# Patient Record
Sex: Female | Born: 1945 | Race: White | Hispanic: No | Marital: Married | Smoking: Never smoker
Health system: Southern US, Community
[De-identification: ages and names within clinical notes are randomized; demographics above are authoritative.]

## PROBLEM LIST (undated history)

## (undated) DIAGNOSIS — R7303 Prediabetes: Secondary | ICD-10-CM

## (undated) DIAGNOSIS — N950 Postmenopausal bleeding: Secondary | ICD-10-CM

## (undated) DIAGNOSIS — H269 Unspecified cataract: Secondary | ICD-10-CM

## (undated) DIAGNOSIS — I639 Cerebral infarction, unspecified: Secondary | ICD-10-CM

## (undated) DIAGNOSIS — N6009 Solitary cyst of unspecified breast: Secondary | ICD-10-CM

## (undated) DIAGNOSIS — Q182 Other branchial cleft malformations: Secondary | ICD-10-CM

## (undated) DIAGNOSIS — G629 Polyneuropathy, unspecified: Secondary | ICD-10-CM

## (undated) DIAGNOSIS — W19XXXA Unspecified fall, initial encounter: Secondary | ICD-10-CM

## (undated) DIAGNOSIS — O339 Maternal care for disproportion, unspecified: Secondary | ICD-10-CM

## (undated) DIAGNOSIS — C801 Malignant (primary) neoplasm, unspecified: Secondary | ICD-10-CM

## (undated) DIAGNOSIS — I10 Essential (primary) hypertension: Secondary | ICD-10-CM

## (undated) DIAGNOSIS — J189 Pneumonia, unspecified organism: Secondary | ICD-10-CM

## (undated) DIAGNOSIS — E119 Type 2 diabetes mellitus without complications: Secondary | ICD-10-CM

## (undated) DIAGNOSIS — N840 Polyp of corpus uteri: Secondary | ICD-10-CM

## (undated) DIAGNOSIS — O039 Complete or unspecified spontaneous abortion without complication: Secondary | ICD-10-CM

## (undated) DIAGNOSIS — J40 Bronchitis, not specified as acute or chronic: Secondary | ICD-10-CM

## (undated) DIAGNOSIS — T7840XA Allergy, unspecified, initial encounter: Secondary | ICD-10-CM

## (undated) DIAGNOSIS — G459 Transient cerebral ischemic attack, unspecified: Secondary | ICD-10-CM

## (undated) DIAGNOSIS — R7302 Impaired glucose tolerance (oral): Secondary | ICD-10-CM

## (undated) DIAGNOSIS — R0600 Dyspnea, unspecified: Secondary | ICD-10-CM

## (undated) DIAGNOSIS — F419 Anxiety disorder, unspecified: Secondary | ICD-10-CM

## (undated) DIAGNOSIS — G4730 Sleep apnea, unspecified: Secondary | ICD-10-CM

## (undated) DIAGNOSIS — Z87442 Personal history of urinary calculi: Secondary | ICD-10-CM

## (undated) DIAGNOSIS — I499 Cardiac arrhythmia, unspecified: Secondary | ICD-10-CM

## (undated) DIAGNOSIS — R011 Cardiac murmur, unspecified: Secondary | ICD-10-CM

## (undated) DIAGNOSIS — M1990 Unspecified osteoarthritis, unspecified site: Secondary | ICD-10-CM

## (undated) DIAGNOSIS — M8580 Other specified disorders of bone density and structure, unspecified site: Secondary | ICD-10-CM

## (undated) DIAGNOSIS — N200 Calculus of kidney: Secondary | ICD-10-CM

## (undated) DIAGNOSIS — I480 Paroxysmal atrial fibrillation: Secondary | ICD-10-CM

## (undated) DIAGNOSIS — E669 Obesity, unspecified: Secondary | ICD-10-CM

## (undated) HISTORY — DX: Cerebral infarction, unspecified: I63.9

## (undated) HISTORY — PX: HYSTEROSCOPY: SHX211

## (undated) HISTORY — DX: Complete or unspecified spontaneous abortion without complication: O03.9

## (undated) HISTORY — DX: Postmenopausal bleeding: N95.0

## (undated) HISTORY — PX: TUBAL LIGATION: SHX77

## (undated) HISTORY — DX: Unspecified cataract: H26.9

## (undated) HISTORY — DX: Allergy, unspecified, initial encounter: T78.40XA

## (undated) HISTORY — DX: Other specified disorders of bone density and structure, unspecified site: M85.80

## (undated) HISTORY — DX: Unspecified fall, initial encounter: W19.XXXA

## (undated) HISTORY — DX: Cardiac murmur, unspecified: R01.1

## (undated) HISTORY — DX: Maternal care for disproportion, unspecified: O33.9

## (undated) HISTORY — DX: Other branchial cleft malformations: Q18.2

## (undated) HISTORY — PX: JOINT REPLACEMENT: SHX530

## (undated) HISTORY — PX: DILATION AND CURETTAGE OF UTERUS: SHX78

## (undated) HISTORY — DX: Calculus of kidney: N20.0

## (undated) HISTORY — DX: Polyp of corpus uteri: N84.0

## (undated) HISTORY — DX: Solitary cyst of unspecified breast: N60.09

---

## 1970-03-23 DIAGNOSIS — Q182 Other branchial cleft malformations: Secondary | ICD-10-CM

## 1970-03-23 HISTORY — DX: Other branchial cleft malformations: Q18.2

## 1979-03-24 DIAGNOSIS — O039 Complete or unspecified spontaneous abortion without complication: Secondary | ICD-10-CM

## 1979-03-24 HISTORY — DX: Complete or unspecified spontaneous abortion without complication: O03.9

## 1981-07-21 DIAGNOSIS — O339 Maternal care for disproportion, unspecified: Secondary | ICD-10-CM

## 1981-07-21 HISTORY — DX: Maternal care for disproportion, unspecified: O33.9

## 1990-03-23 DIAGNOSIS — N6009 Solitary cyst of unspecified breast: Secondary | ICD-10-CM

## 1990-03-23 HISTORY — DX: Solitary cyst of unspecified breast: N60.09

## 1991-04-24 HISTORY — PX: CERVICAL DISC SURGERY: SHX588

## 1997-05-24 ENCOUNTER — Ambulatory Visit (HOSPITAL_COMMUNITY): Admission: RE | Admit: 1997-05-24 | Discharge: 1997-05-24 | Payer: Self-pay | Admitting: *Deleted

## 1997-06-18 ENCOUNTER — Other Ambulatory Visit: Admission: RE | Admit: 1997-06-18 | Discharge: 1997-06-18 | Payer: Self-pay | Admitting: *Deleted

## 1998-06-24 ENCOUNTER — Other Ambulatory Visit: Admission: RE | Admit: 1998-06-24 | Discharge: 1998-06-24 | Payer: Self-pay | Admitting: *Deleted

## 1998-07-09 ENCOUNTER — Encounter: Payer: Self-pay | Admitting: *Deleted

## 1998-07-09 ENCOUNTER — Ambulatory Visit (HOSPITAL_COMMUNITY): Admission: RE | Admit: 1998-07-09 | Discharge: 1998-07-09 | Payer: Self-pay | Admitting: *Deleted

## 1999-06-23 ENCOUNTER — Other Ambulatory Visit: Admission: RE | Admit: 1999-06-23 | Discharge: 1999-06-23 | Payer: Self-pay | Admitting: *Deleted

## 1999-06-25 ENCOUNTER — Encounter: Admission: RE | Admit: 1999-06-25 | Discharge: 1999-06-25 | Payer: Self-pay | Admitting: *Deleted

## 1999-06-25 ENCOUNTER — Encounter: Payer: Self-pay | Admitting: *Deleted

## 1999-07-14 ENCOUNTER — Ambulatory Visit (HOSPITAL_COMMUNITY): Admission: RE | Admit: 1999-07-14 | Discharge: 1999-07-14 | Payer: Self-pay | Admitting: *Deleted

## 1999-07-14 ENCOUNTER — Encounter: Payer: Self-pay | Admitting: *Deleted

## 2000-04-23 DIAGNOSIS — N950 Postmenopausal bleeding: Secondary | ICD-10-CM

## 2000-04-23 DIAGNOSIS — N840 Polyp of corpus uteri: Secondary | ICD-10-CM

## 2000-04-23 HISTORY — DX: Postmenopausal bleeding: N95.0

## 2000-04-23 HISTORY — DX: Polyp of corpus uteri: N84.0

## 2000-07-09 ENCOUNTER — Other Ambulatory Visit: Admission: RE | Admit: 2000-07-09 | Discharge: 2000-07-09 | Payer: Self-pay | Admitting: *Deleted

## 2000-07-26 ENCOUNTER — Encounter (INDEPENDENT_AMBULATORY_CARE_PROVIDER_SITE_OTHER): Payer: Self-pay

## 2000-07-26 ENCOUNTER — Observation Stay (HOSPITAL_COMMUNITY): Admission: EM | Admit: 2000-07-26 | Discharge: 2000-07-27 | Payer: Self-pay | Admitting: Obstetrics and Gynecology

## 2001-01-06 ENCOUNTER — Encounter: Payer: Self-pay | Admitting: *Deleted

## 2001-01-06 ENCOUNTER — Ambulatory Visit (HOSPITAL_COMMUNITY): Admission: RE | Admit: 2001-01-06 | Discharge: 2001-01-06 | Payer: Self-pay | Admitting: *Deleted

## 2001-08-01 ENCOUNTER — Other Ambulatory Visit: Admission: RE | Admit: 2001-08-01 | Discharge: 2001-08-01 | Payer: Self-pay | Admitting: Obstetrics and Gynecology

## 2001-11-09 ENCOUNTER — Inpatient Hospital Stay (HOSPITAL_COMMUNITY): Admission: EM | Admit: 2001-11-09 | Discharge: 2001-11-12 | Payer: Self-pay | Admitting: Emergency Medicine

## 2001-11-19 ENCOUNTER — Emergency Department (HOSPITAL_COMMUNITY): Admission: EM | Admit: 2001-11-19 | Discharge: 2001-11-19 | Payer: Self-pay | Admitting: Emergency Medicine

## 2002-01-09 ENCOUNTER — Ambulatory Visit (HOSPITAL_COMMUNITY): Admission: RE | Admit: 2002-01-09 | Discharge: 2002-01-09 | Payer: Self-pay | Admitting: *Deleted

## 2002-01-09 ENCOUNTER — Encounter: Payer: Self-pay | Admitting: *Deleted

## 2002-08-08 ENCOUNTER — Other Ambulatory Visit: Admission: RE | Admit: 2002-08-08 | Discharge: 2002-08-08 | Payer: Self-pay | Admitting: *Deleted

## 2002-11-14 ENCOUNTER — Encounter: Payer: Self-pay | Admitting: *Deleted

## 2002-11-14 ENCOUNTER — Encounter: Admission: RE | Admit: 2002-11-14 | Discharge: 2002-11-14 | Payer: Self-pay | Admitting: *Deleted

## 2003-04-05 ENCOUNTER — Ambulatory Visit (HOSPITAL_COMMUNITY): Admission: RE | Admit: 2003-04-05 | Discharge: 2003-04-05 | Payer: Self-pay | Admitting: *Deleted

## 2003-08-09 ENCOUNTER — Other Ambulatory Visit: Admission: RE | Admit: 2003-08-09 | Discharge: 2003-08-09 | Payer: Self-pay | Admitting: *Deleted

## 2004-08-11 ENCOUNTER — Other Ambulatory Visit: Admission: RE | Admit: 2004-08-11 | Discharge: 2004-08-11 | Payer: Self-pay | Admitting: *Deleted

## 2004-10-24 ENCOUNTER — Ambulatory Visit (HOSPITAL_COMMUNITY): Admission: RE | Admit: 2004-10-24 | Discharge: 2004-10-24 | Payer: Self-pay | Admitting: *Deleted

## 2005-06-21 HISTORY — PX: WRIST SURGERY: SHX841

## 2006-01-01 ENCOUNTER — Ambulatory Visit: Payer: Self-pay | Admitting: Internal Medicine

## 2006-01-05 ENCOUNTER — Ambulatory Visit: Payer: Self-pay | Admitting: Internal Medicine

## 2006-02-19 ENCOUNTER — Other Ambulatory Visit: Admission: RE | Admit: 2006-02-19 | Discharge: 2006-02-19 | Payer: Self-pay | Admitting: Obstetrics & Gynecology

## 2006-02-19 ENCOUNTER — Ambulatory Visit (HOSPITAL_COMMUNITY): Admission: RE | Admit: 2006-02-19 | Discharge: 2006-02-19 | Payer: Self-pay | Admitting: Obstetrics & Gynecology

## 2006-08-22 HISTORY — PX: TOTAL KNEE ARTHROPLASTY: SHX125

## 2006-09-06 ENCOUNTER — Inpatient Hospital Stay (HOSPITAL_COMMUNITY): Admission: RE | Admit: 2006-09-06 | Discharge: 2006-09-09 | Payer: Self-pay | Admitting: Orthopedic Surgery

## 2007-02-18 ENCOUNTER — Ambulatory Visit: Payer: Self-pay | Admitting: Internal Medicine

## 2007-02-22 DIAGNOSIS — T7840XA Allergy, unspecified, initial encounter: Secondary | ICD-10-CM | POA: Insufficient documentation

## 2007-02-22 DIAGNOSIS — G4733 Obstructive sleep apnea (adult) (pediatric): Secondary | ICD-10-CM | POA: Insufficient documentation

## 2007-02-22 DIAGNOSIS — I10 Essential (primary) hypertension: Secondary | ICD-10-CM | POA: Insufficient documentation

## 2007-03-30 ENCOUNTER — Encounter: Payer: Self-pay | Admitting: Internal Medicine

## 2007-05-22 DIAGNOSIS — M8580 Other specified disorders of bone density and structure, unspecified site: Secondary | ICD-10-CM

## 2007-05-22 HISTORY — DX: Other specified disorders of bone density and structure, unspecified site: M85.80

## 2007-06-06 ENCOUNTER — Other Ambulatory Visit: Admission: RE | Admit: 2007-06-06 | Discharge: 2007-06-06 | Payer: Self-pay | Admitting: Obstetrics & Gynecology

## 2007-06-21 ENCOUNTER — Ambulatory Visit (HOSPITAL_COMMUNITY): Admission: RE | Admit: 2007-06-21 | Discharge: 2007-06-21 | Payer: Self-pay | Admitting: Obstetrics & Gynecology

## 2008-03-02 ENCOUNTER — Ambulatory Visit: Payer: Self-pay | Admitting: Internal Medicine

## 2008-05-03 ENCOUNTER — Emergency Department (HOSPITAL_COMMUNITY): Admission: EM | Admit: 2008-05-03 | Discharge: 2008-05-03 | Payer: Self-pay | Admitting: Emergency Medicine

## 2008-06-21 HISTORY — PX: TOTAL KNEE ARTHROPLASTY: SHX125

## 2008-07-09 ENCOUNTER — Inpatient Hospital Stay (HOSPITAL_COMMUNITY): Admission: RE | Admit: 2008-07-09 | Discharge: 2008-07-13 | Payer: Self-pay | Admitting: Orthopedic Surgery

## 2009-03-01 ENCOUNTER — Ambulatory Visit: Payer: Self-pay | Admitting: Internal Medicine

## 2009-03-28 ENCOUNTER — Emergency Department (HOSPITAL_COMMUNITY): Admission: EM | Admit: 2009-03-28 | Discharge: 2009-03-29 | Payer: Self-pay | Admitting: Emergency Medicine

## 2009-08-28 ENCOUNTER — Telehealth (INDEPENDENT_AMBULATORY_CARE_PROVIDER_SITE_OTHER): Payer: Self-pay | Admitting: *Deleted

## 2009-08-30 ENCOUNTER — Ambulatory Visit: Payer: Self-pay | Admitting: Internal Medicine

## 2009-08-30 DIAGNOSIS — J209 Acute bronchitis, unspecified: Secondary | ICD-10-CM | POA: Insufficient documentation

## 2009-10-04 ENCOUNTER — Ambulatory Visit (HOSPITAL_COMMUNITY): Admission: RE | Admit: 2009-10-04 | Discharge: 2009-10-04 | Payer: Self-pay | Admitting: Obstetrics & Gynecology

## 2010-02-07 ENCOUNTER — Emergency Department (HOSPITAL_COMMUNITY): Admission: EM | Admit: 2010-02-07 | Discharge: 2010-02-07 | Payer: Self-pay | Admitting: Emergency Medicine

## 2010-02-28 ENCOUNTER — Ambulatory Visit: Payer: Self-pay | Admitting: Internal Medicine

## 2010-04-24 NOTE — Miscellaneous (Signed)
Summary: CPAP report/[facility]  CPAP report/[facility]   Imported By: Maryln Gottron 04/05/2007 15:47:24  _____________________________________________________________________  External Attachment:    Type:   Image     Comment:   External Document

## 2010-04-24 NOTE — Assessment & Plan Note (Signed)
Summary: bronchitis per pt/accute visit/kcw   Primary Provider/Referring Provider:  Burnis Medin  CC:  Accute visit-SOB and cough-prod-clear to white-wheezing.Marland Kitchen  History of Present Illness:  03/02/08-OSA After last here she was autotitrated to cpap 11 for AHI 1-2/hr. She feels fine now w/ cpap 11- denies snore, tiredness. Doing fine and denies concerns or questions.  March 01, 2009- OSA Good compliance and control with CPAP 11, now using a nasal mask. Life is better withi it. she sleeps quietly. Had her other knee replaced in April. She coughed after surgery. Some occsional dry cough. We discussed Lisinopril. She is currently not bothered enough to want to change.  August 30, 2009- OSA Acute visit- Shared a viral bronchitis with husband and daughter. Husband got better. Daughter was dx'd asthma and given pred and abx. Sputum never purulent. Denies fever and sore throat but malaise. No GI upset. Some wheeze and deep tickle cough. took robitussin- nausea. Rock and rye suppresses cough.   Preventive Screening-Counseling & Management  Alcohol-Tobacco     Smoking Status: never  Current Medications (verified): 1)  Lisinopril-Hydrochlorothiazide 20-12.5 Mg Tabs (Lisinopril-Hydrochlorothiazide) .... Take 2 By Mouth Once Daily 2)  Calcium 1000mg  +d .... Take One Tab By Mouth Once Daily 3)  Centrum Silver   Tabs (Multiple Vitamins-Minerals) .... Take By Mouth Once Daily 4)  B Complex .... Use As Needed 5)  Cpap 11 Advanced 6)  Coq10 200 Mg Caps (Coenzyme Q10) .... Take 1 By Mouth Once Daily  Allergies (verified): 1)  ! Penicillin 2)  ! * Vistoril 3)  ! Tetracycline 4)  ! Erythromycin 5)  ! * Latex  Past History:  Past Medical History: Last updated: 02/22/2007 Hypertension Sleep Apnea  Past Surgical History: Last updated: 03/01/2009 cervical disk bilateral  knee replacement C-section D&C repair fx left wrist  Social History: Last updated: 03/02/2008 Patient  never smoked.  Married  Risk Factors: Smoking Status: never (08/30/2009)  Review of Systems      See HPI  The patient denies anorexia, fever, weight loss, weight gain, vision loss, decreased hearing, hoarseness, chest pain, syncope, dyspnea on exertion, peripheral edema, prolonged cough, headaches, hemoptysis, abdominal pain, melena, hematochezia, and severe indigestion/heartburn.    Vital Signs:  Patient profile:   65 year old female Weight:      245 pounds O2 Sat:      95 % on Room air Pulse rate:   75 / minute BP sitting:   136 / 84  (left arm) Cuff size:   large  Vitals Entered By: Reynaldo Minium CMA (August 30, 2009 12:34 PM)  O2 Flow:  Room air CC: Accute visit-SOB, cough-prod-clear to white-wheezing.   Physical Exam  Additional Exam:  General: A/Ox3; pleasant and cooperative, NAD, obese SKIN: no rash, lesions NODES: no lymphadenopathy HEENT: [place]/AT, EOM- WNL, Conjuctivae- clear, PERRLA, TM-WNL, Nose- clear, Throat- clear and wnl, Melampatti III NECK: Supple w/ fair ROM, JVD- none, normal carotid impulses w/o bruits Thyroid-  CHEST: Clear to P&A, no cough at all even with deep breath HEART: RRR, no m/g/r heard ABDOMEN: Soft and nl; nml bowel sounds; no organomegaly or masses noted ZOX:WRUE, nl pulses, no edema  NEURO: Grossly intact to observation      Impression & Recommendations:  Problem # 1:  ACUTE BRONCHITIS (ICD-466.0)  This was originally a shared visral pattern, possibly now sustained through pollen season and intothe orange ozone air qulaity days. She is broadly allergic to antibioitics with rsh, so will avoid that. will give neb  xop and depo.   Her updated medication list for this problem includes:    Promethazine-codeine 6.25-10 Mg/27ml Syrp (Promethazine-codeine) .Marland Kitchen... 1 teaspoon four times a day as needed cough  Problem # 2:  SLEEP APNEA (ICD-780.57) Continues good compliance and contro with her CPAP  Medications Added to Medication List This  Visit: 1)  Calcium 1000mg  +d  .... Take one tab by mouth once daily 2)  Coq10 200 Mg Caps (Coenzyme q10) .... Take 1 by mouth once daily 3)  Promethazine-codeine 6.25-10 Mg/69ml Syrp (Promethazine-codeine) .Marland Kitchen.. 1 teaspoon four times a day as needed cough  Other Orders: Est. Patient Level III (16109) Depo- Medrol 80mg  (J1040) Admin of Therapeutic Inj  intramuscular or subcutaneous (60454) Xopenex 1.25mg  (U9811) Nebulizer Tx (91478)  Patient Instructions: 1)  Please schedule a follow-up appointment in 1 year or as scheduled 2)  Neb xop 1.25 3)  depo 8p0 4)  Extra fluids to thin mucus. You can try plain mucinex otc if you need extra help clearing mucus. 5)  script for cough syrup if needed- may cause drowsiness. Prescriptions: PROMETHAZINE-CODEINE 6.25-10 MG/5ML SYRP (PROMETHAZINE-CODEINE) 1 teaspoon four times a day as needed cough  #200 ml x 0   Entered and Authorized by:   Waymon Budge MD   Signed by:   Waymon Budge MD on 08/30/2009   Method used:   Print then Give to Patient   RxID:   [id number]    Medication Administration  Injection # 1:    Medication: Depo- Medrol 80mg     Diagnosis: ACUTE BRONCHITIS (ICD-466.0)    Route: IM    Site: R deltoid    Exp Date: 06/2012    Lot #: [id number]    Mfr: Pharmacia    Patient tolerated injection without complications    Given by: Zackery Barefoot CMA (August 30, 2009 1:18 PM)  Medication # 1:    Medication: Xopenex 1.25mg     Diagnosis: ACUTE BRONCHITIS (ICD-466.0)    Dose: 1vial    Route: inhaled    Exp Date: 09-11    Lot #: [id number]    Mfr: Sepracor    Patient tolerated medication without complications    Given by: Zackery Barefoot CMA (August 30, 2009 1:20 PM)  Orders Added: 1)  Est. Patient Level III [13244] 2)  Depo- Medrol 80mg  [J1040] 3)  Admin of Therapeutic Inj  intramuscular or subcutaneous [96372] 4)  Xopenex 1.25mg  [J7614] 5)  Nebulizer Tx [01027]

## 2010-04-24 NOTE — Assessment & Plan Note (Signed)
Summary: rov 1 yr ///kp   Primary Provider/Referring Provider:  Burnis Medin  CC:  Yearly follow up visit-sleep spnea; needs new supplies..  History of Present Illness:  03/02/08-OSA After last here she was autotitrated to cpap 11 for AHI 1-2/hr. She feels fine now w/ cpap 11- denies snore, tiredness. Doing fine and denies concerns or questions.  March 01, 2009- OSA Good compliance and control with CPAP 11, now using a nasal mask. Life is better withi it. she sleeps quietly. Had her other knee replaced in April. She coughed after surgery. Some occsional dry cough. We discussed Lisinopril. She is currently not bothered enough to want to change.  August 30, 2009- OSA Acute visit- Shared a viral bronchitis with husband and daughter. Husband got better. Daughter was dx'd asthma and given pred and abx. Sputum never purulent. Denies fever and sore throat but malaise. No GI upset. Some wheeze and deep tickle cough. took robitussin- nausea. Rock and rye suppresses cough.  03-04-10-  OSA Nurse-CC: Yearly follow up visit-sleep spnea; needs new supplies. Using CPAP every night with good control at 11 cwp. She denies changes in her health status over the past year. She was changed from lisinopril due to cough and is doing better.     Preventive Screening-Counseling & Management  Alcohol-Tobacco     Smoking Status: never  Current Medications (verified): 1)  Losartan Potassium-Hctz 100-25 Mg Tabs (Losartan Potassium-Hctz) .... Take 1 By Mouth Once Daily 2)  Calcium 1000mg  +d .... Take One Tab By Mouth Once Daily 3)  Centrum Silver   Tabs (Multiple Vitamins-Minerals) .... Take By Mouth Once Daily 4)  B Complex .... Use As Needed 5)  Cpap 11 Advanced 6)  Coq10 200 Mg Caps (Coenzyme Q10) .... Take 1 By Mouth Once Daily  Allergies (verified): 1)  ! Penicillin 2)  ! * Vistoril 3)  ! Tetracycline 4)  ! Erythromycin 5)  ! * Latex  Past History:  Past Medical  History: Last updated: 02/22/2007 Hypertension Sleep Apnea  Past Surgical History: Last updated: 03/01/2009 cervical disk bilateral  knee replacement C-section D&C repair fx left wrist  Family History: Last updated: 03-04-2010 Father- died [place]  99 Mother- died [place]  54  Social History: Last updated: 03/02/2008 Patient never smoked.  Married  Risk Factors: Smoking Status: never (03-04-2010)  Family History: Father- died [place]  46 Mother- died [place]  91  Review of Systems      See HPI  The patient denies anorexia, fever, weight loss, weight gain, vision loss, decreased hearing, hoarseness, chest pain, syncope, dyspnea on exertion, peripheral edema, prolonged cough, headaches, hemoptysis, abdominal pain, severe indigestion/heartburn, muscle weakness, suspicious skin lesions, transient blindness, enlarged lymph nodes, and angioedema.    Vital Signs:  Patient profile:   65 year old female Weight:      255 pounds O2 Sat:      96 % on Room air Pulse rate:   100 / minute BP sitting:   134 / 80  (left arm) Cuff size:   large  Vitals Entered By: Reynaldo Minium CMA 03-04-10 2:35 PM)  O2 Flow:  Room air CC: Yearly follow up visit-sleep spnea; needs new supplies.   Physical Exam  Additional Exam:  General: A/Ox3; pleasant and cooperative, NAD, obese SKIN: no rash, lesions NODES: no lymphadenopathy HEENT: [employer]/AT, EOM- WNL, Conjuctivae- clear, PERRLA, TM-WNL, Nose- clear, Throat- clear and wnl, Mallampati  III NECK: Supple w/ fair ROM, JVD- none, normal  carotid impulses w/o bruits Thyroid-  CHEST: Clear to P&A, no cough at all even with deep breath HEART: RRR, no m/g/r heard ABDOMEN:Obese ZOX:WRUE, nl pulses, no edema  NEURO: Grossly intact to observation      Impression & Recommendations:  Problem # 1:  SLEEP APNEA (ICD-780.57)  Good compliace and control on CPAP. She needs some help communicating with the DME company about her supplies, but pressure and mask  seem good. Weight loss is encouraged.   Problem # 2:  HYPERTENSION (ICD-401.9)  We reviewed her experience changing from an ACE inhibitor as far a her cough , and contrasted allergy.  Her updated medication list for this problem includes:    Losartan Potassium-hctz 100-25 Mg Tabs (Losartan potassium-hctz) .Marland Kitchen... Take 1 by mouth once daily  Problem # 3:  ALLERGY (ICD-995.3) This is not a siginificant allergy season for her. We discussed colds, dry heat, indoor dust and ACEinhibitors.   Medications Added to Medication List This Visit: 1)  Losartan Potassium-hctz 100-25 Mg Tabs (Losartan potassium-hctz) .... Take 1 by mouth once daily 2)  Replacement Cpap Mask and Supplies   Other Orders: Est. Patient Level IV (45409)  Patient Instructions: 1)  Please schedule a follow-up appointment in 1 year. 2)  Script for replacement CPAP mask and supplies Prescriptions: REPLACEMENT CPAP MASK AND SUPPLIES   #1 x prn   Entered and Authorized by:   Waymon Budge MD   Signed by:   Waymon Budge MD on 02/28/2010   Method used:   Print then Give to Patient   RxID:   [id number]

## 2010-04-24 NOTE — Assessment & Plan Note (Signed)
Summary: 1 year//mbw   Primary Provider/Referring Provider:  Burnis Medin  CC:  follow up visit-sleep .  History of Present Illness: 02/18/07- PROBLEM:  A 65 year old woman with sleep apnea seeking to re-establish.   HISTORY:  [name] has a long history of sleep apnea with original symptoms of snoring and excessive daytime sleepiness.  On April 22 liters 1987, she had a diagnostic sleep study with an AHI of 51 obstructive events per hour.  At that time, she weighed 160 pounds. CPAP was titrated to 15, never with good control because of central events but she has continued at that pressure.  Her machine has been provided through [employer] and is now wearing out being at least 65 years old.  Life is definitely better using CPAP which she does regularly and she denies routine snoring or daytime sleepiness.  Bedtime is 9 p.m. to 10 p.m. with variable sleep latency, waking at least once during the night before finally up between 6:30 and 7 a.m.   03/02/08-OSA After last here she was autotitrated to cpap 11 for AHI 1-2/hr. She feels fine now w/ cpap 11- denies snore, tiredness. Doing fine and denies concerns or questions.  March 01, 2009- OSA Good compliance and control with CPAP 11, now using a nasal mask. Life is better withi it. she sleeps quietly. Had her other knee replaced in April. She coughed after surgery. Some occsional dry cough. We discussed Lisinopril. She is currently not bothered enough to want to change.   Current Medications (verified): 1)  Lisinopril-Hydrochlorothiazide 20-12.5 Mg Tabs (Lisinopril-Hydrochlorothiazide) .... Take 2 By Mouth Once Daily 2)  Calcium 600mg  +d .... Take One Tab By Mouth Once Daily 3)  Centrum Silver   Tabs (Multiple Vitamins-Minerals) .... Take By Mouth Once Daily 4)  B Complex .... Use As Needed 5)  Cpap 11 Advanced  Allergies (verified): 1)  ! Penicillin 2)  ! * Vistoril 3)  ! Tetracycline 4)  ! Erythromycin 5)  !  * Latex  Past History:  Past Medical History: Last updated: 02/22/2007 Hypertension Sleep Apnea  Social History: Last updated: 03/02/2008 Patient never smoked.  Married  Risk Factors: Smoking Status: never (03/02/2008)  Past Surgical History: cervical disk bilateral  knee replacement C-section D&C repair fx left wrist  Review of Systems      See HPI  The patient denies anorexia, fever, weight loss, weight gain, vision loss, decreased hearing, hoarseness, chest pain, syncope, dyspnea on exertion, peripheral edema, prolonged cough, headaches, hemoptysis, abdominal pain, and melena.    Vital Signs:  Patient profile:   65 year old female Weight:      240.38 pounds O2 Sat:      97 % on Room air Pulse rate:   73 / minute BP sitting:   126 / 78  (left arm) Cuff size:   large  Vitals Entered By: Reynaldo Minium CMA (March 01, 2009 2:21 PM)  O2 Flow:  Room air  Physical Exam  Additional Exam:  General: A/Ox3; pleasant and cooperative, NAD, obese SKIN: no rash, lesions NODES: no lymphadenopathy HEENT: /AT, EOM- WNL, Conjuctivae- clear, PERRLA, TM-WNL, Nose- clear, Throat- clear and wnl, Melampatti III NECK: Supple w/ fair ROM, JVD- none, normal carotid impulses w/o bruits Thyroid-  CHEST: Clear to P&A, no cough at all even with deep breath HEART: RRR, no m/g/r heard ABDOMEN: Soft and nl; nml bowel sounds; no organomegaly or masses noted ZOX:WRUE, nl pulses, no edema  NEURO: Grossly intact to observation  Impression & Recommendations:  Problem # 1:  SLEEP APNEA (ICD-780.57)  We can continue her cpap at 11, noting very good compliance and control. Weight loss would help.  Medications Added to Medication List This Visit: 1)  Lisinopril-hydrochlorothiazide 20-12.5 Mg Tabs (Lisinopril-hydrochlorothiazide) .... Take 2 by mouth once daily  Other Orders: Est. Patient Level II (16109)  Patient Instructions: 1)  Schedule return in one year, earlier if needed  2)  continue cpap at 11- call if any problems  Prevention & Chronic Care Immunizations   Influenza vaccine: Not documented    Tetanus booster: Not documented    Pneumococcal vaccine: Not documented    H. zoster vaccine: Not documented  Colorectal Screening   Hemoccult: Not documented    Colonoscopy: Not documented  Other Screening   Pap smear: Not documented    Mammogram: Not documented    DXA bone density scan: Not documented   Smoking status: never  (03/02/2008)  Lipids   Total Cholesterol: Not documented   LDL: Not documented   LDL Direct: Not documented   HDL: Not documented   Triglycerides: Not documented  Hypertension   Last Blood Pressure: 126 / 78  (03/01/2009)   Serum creatinine: Not documented   Serum potassium Not documented  Self-Management Support :    Hypertension self-management support: Not documented

## 2010-04-24 NOTE — Progress Notes (Signed)
Summary: appt needed/cb  Phone Note Call from Patient Call back at Home Phone [phone]   Caller: Patient Call For: young Summary of Call: pt has bronchitis and wants to be seen friday nothin avaliable till 6/14 Initial call taken by: Lacinda Axon,  August 28, 2009 2:42 PM  Follow-up for Phone Call        Pt can come in on Friday at 1115am; pt aware of the appt and will be here at 11am.Katie [facility] CMA  August 28, 2009 2:46 PM

## 2010-04-24 NOTE — Assessment & Plan Note (Signed)
Summary: rov/apc   PCP:  Burnis Medin  Chief Complaint:  follow up visit .  History of Present Illness: Current Problems:  ALLERGY (ICD-995.3) SLEEP APNEA (ICD-780.57) HYPERTENSION (ICD-401.9)  02/18/07- PROBLEM:  A 65 year old woman with sleep apnea seeking to re-establish.   HISTORY:  Nicole Cabrera has a long history of sleep apnea with original symptoms of snoring and excessive daytime sleepiness.  On April 22 liters 1987, she had a diagnostic sleep study with an AHI of 51 obstructive events per hour.  At that time, she weighed 160 pounds. CPAP was titrated to 15, never with good control because of central events but she has continued at that pressure.  Her machine has been provided through [employer] and is now wearing out being at least 65 years old.  Life is definitely better using CPAP which she does regularly and she denies routine snoring or daytime sleepiness.  Bedtime is 9 p.m. to 10 p.m. with variable sleep latency, waking at least once during the night before finally up between 6:30 and 7 a.m.   03/02/08-OSA After last here she was autotitrated to cpap 11 for AHI 1-2/hr. She feels fine now w/ cpap 11- denies snore, tiredness. Doing fine and denies concerns or questions.          Prior Medications Reviewed Using: Patient Recall  Updated Prior Medication List: LISINOPRIL-HYDROCHLOROTHIAZIDE 20-25 MG TABS (LISINOPRIL-HYDROCHLOROTHIAZIDE) take 1 by mouth once daily * CALCIUM 600MG  +D take one tab by mouth once daily KLOR-CON M10 10 MEQ  TBCR (POTASSIUM CHLORIDE CRYS CR) take by mouth once daily CENTRUM SILVER   TABS (MULTIPLE VITAMINS-MINERALS) take by mouth once daily * B COMPLEX use as needed  Current Allergies (reviewed today): ! PENICILLIN ! * VISTORIL ! TETRACYCLINE ! ERYTHROMYCIN ! * LATEX  Past Medical History:    Reviewed history from 02/22/2007 and no changes required:       Hypertension       Sleep Apnea  Past Surgical History:   Reviewed history and no changes required:       cervical disk       right knee replacement       C-section       D&C       epair fx left wrist   Social History:    Reviewed history and no changes required:       Patient never smoked.        Married   Risk Factors:  Tobacco use:  never   Review of Systems      See HPI   Vital Signs:  Patient Profile:   65 Years Old Female Weight:      251.50 pounds O2 Sat:      95 % O2 treatment:    Room Air Pulse rate:   69 / minute BP sitting:   142 / 92  (right arm) Cuff size:   large  Vitals Entered By: Nicole Cabrera CMA (March 02, 2008 2:08 PM)             Comments Medications reviewed with patient Nicole Cabrera CMA  March 02, 2008 2:08 PM      Physical Exam  General: A/Ox3; pleasant and cooperative, NAD, obese SKIN: no rash, lesions NODES: no lymphadenopathy HEENT: [place]/AT, EOM- WNL, Conjuctivae- clear, PERRLA, TM-WNL, Nose- clear, Throat- clear and wnl, Melampatti III NECK: Supple w/ fair ROM, JVD- none, normal carotid impulses w/o bruits Thyroid- normal to palpation CHEST: Clear to P&A HEART: RRR, no m/g/r heard  ABDOMEN: Soft and nl; nml bowel sounds; no organomegaly or masses noted WNU:UVOZ, nl pulses, no edema  NEURO: Grossly intact to observation         Impression & Recommendations:  Problem # 1:  SLEEP APNEA (ICD-780.57) Good control and compliance  Problem # 2:  ALLERGY (ICD-995.3) Discussed options. Prn antihistamine.  Medications Added to Medication List This Visit: 1)  Lisinopril-hydrochlorothiazide 20-25 Mg Tabs (Lisinopril-hydrochlorothiazide) .... Take 1 by mouth once daily 2)  Cpap 11 Advanced    Patient Instructions: 1)  Please schedule a follow-up appointment in 1 year. 2)  Continue cpap at 11 cwp. Call the home care company or call us if you have questions or problems.   ]

## 2010-06-08 LAB — BASIC METABOLIC PANEL
BUN: 16 mg/dL (ref 6–23)
CO2: 28 mEq/L (ref 19–32)
Calcium: 9.4 mg/dL (ref 8.4–10.5)
Chloride: 102 mEq/L (ref 96–112)
Creatinine, Ser: 1.02 mg/dL (ref 0.4–1.2)
GFR calc Af Amer: >60 mL/min (ref >60)
GFR calc non Af Amer: 55 mL/min — ABNORMAL LOW (ref >60)
Glucose, Bld: 128 mg/dL — ABNORMAL HIGH (ref 70–99)
Potassium: 3.6 mEq/L (ref 3.5–5.1)
Sodium: 138 mEq/L (ref 135–145)

## 2010-06-08 LAB — APTT: aPTT: 30 seconds (ref 24–37)

## 2010-06-08 LAB — CBC
HCT: 42.1 % (ref 36.0–46.0)
Hemoglobin: 14.2 g/dL (ref 12.0–15.0)
MCHC: 33.8 g/dL (ref 30.0–36.0)
MCV: 83.3 fL (ref 78.0–100.0)
Platelets: 264 10*3/uL (ref 150–400)
RBC: 5.06 MIL/uL (ref 3.87–5.11)
RDW: 14 % (ref 11.5–15.5)
WBC: 11.3 10*3/uL — ABNORMAL HIGH (ref 4.0–10.5)

## 2010-06-08 LAB — DIFFERENTIAL
Basophils Absolute: 0.1 10*3/uL (ref 0.0–0.1)
Basophils Relative: 1 % (ref 0–1)
Eosinophils Absolute: 0.2 10*3/uL (ref 0.0–0.7)
Eosinophils Relative: 2 % (ref 0–5)
Lymphocytes Relative: 20 % (ref 12–46)
Lymphs Abs: 2.2 10*3/uL (ref 0.7–4.0)
Monocytes Absolute: 0.7 10*3/uL (ref 0.1–1.0)
Monocytes Relative: 6 % (ref 3–12)
Neutro Abs: 8.1 10*3/uL — ABNORMAL HIGH (ref 1.7–7.7)
Neutrophils Relative %: 72 % (ref 43–77)

## 2010-06-08 LAB — PROTIME-INR
INR: 0.95 (ref 0.00–1.49)
Prothrombin Time: 12.6 seconds (ref 11.6–15.2)

## 2010-07-02 LAB — TYPE AND SCREEN
ABO/RH(D): O POS
Antibody Screen: NEGATIVE

## 2010-07-02 LAB — CBC
HCT: 31.2 % — ABNORMAL LOW (ref 36.0–46.0)
HCT: 31.8 % — ABNORMAL LOW (ref 36.0–46.0)
HCT: 31.9 % — ABNORMAL LOW (ref 36.0–46.0)
HCT: 40.7 % (ref 36.0–46.0)
Hemoglobin: 10.6 g/dL — ABNORMAL LOW (ref 12.0–15.0)
Hemoglobin: 10.8 g/dL — ABNORMAL LOW (ref 12.0–15.0)
Hemoglobin: 10.8 g/dL — ABNORMAL LOW (ref 12.0–15.0)
Hemoglobin: 13.7 g/dL (ref 12.0–15.0)
MCHC: 33.9 g/dL (ref 30.0–36.0)
MCHC: 34 g/dL (ref 30.0–36.0)
MCHC: 34 g/dL (ref 30.0–36.0)
MCHC: 33.6 g/dL (ref 30.0–36.0)
MCV: 84.2 fL (ref 78.0–100.0)
MCV: 84.6 fL (ref 78.0–100.0)
MCV: 84.6 fL (ref 78.0–100.0)
MCV: 84.6 fL (ref 78.0–100.0)
Platelets: 188 10*3/uL (ref 150–400)
Platelets: 197 10*3/uL (ref 150–400)
Platelets: 203 10*3/uL (ref 150–400)
Platelets: 273 10*3/uL (ref 150–400)
RBC: 3.71 MIL/uL — ABNORMAL LOW (ref 3.87–5.11)
RBC: 3.76 MIL/uL — ABNORMAL LOW (ref 3.87–5.11)
RBC: 3.77 MIL/uL — ABNORMAL LOW (ref 3.87–5.11)
RBC: 4.81 MIL/uL (ref 3.87–5.11)
RDW: 14.3 % (ref 11.5–15.5)
RDW: 14.5 % (ref 11.5–15.5)
RDW: 14.5 % (ref 11.5–15.5)
RDW: 14.1 % (ref 11.5–15.5)
WBC: 10 10*3/uL (ref 4.0–10.5)
WBC: 11.5 10*3/uL — ABNORMAL HIGH (ref 4.0–10.5)
WBC: 11.5 10*3/uL — ABNORMAL HIGH (ref 4.0–10.5)
WBC: 9.1 10*3/uL (ref 4.0–10.5)

## 2010-07-02 LAB — COMPREHENSIVE METABOLIC PANEL
ALT: 25 U/L (ref 0–35)
AST: 21 U/L (ref 0–37)
Albumin: 3.3 g/dL — ABNORMAL LOW (ref 3.5–5.2)
Alkaline Phosphatase: 75 U/L (ref 39–117)
BUN: 10 mg/dL (ref 6–23)
CO2: 30 mEq/L (ref 19–32)
Calcium: 9.1 mg/dL (ref 8.4–10.5)
Chloride: 107 mEq/L (ref 96–112)
Creatinine, Ser: 0.74 mg/dL (ref 0.4–1.2)
GFR calc Af Amer: >60 mL/min (ref >60)
GFR calc non Af Amer: >60 mL/min (ref >60)
Glucose, Bld: 112 mg/dL — ABNORMAL HIGH (ref 70–99)
Potassium: 4.6 mEq/L (ref 3.5–5.1)
Sodium: 142 mEq/L (ref 135–145)
Total Bilirubin: 0.5 mg/dL (ref 0.3–1.2)
Total Protein: 6.5 g/dL (ref 6.0–8.3)

## 2010-07-02 LAB — PROTIME-INR
INR: 1.3 (ref 0.00–1.49)
INR: 1.5 (ref 0.00–1.49)
INR: 1.6 — ABNORMAL HIGH (ref 0.00–1.49)
INR: 1.9 — ABNORMAL HIGH (ref 0.00–1.49)
INR: 1 (ref 0.00–1.49)
Prothrombin Time: 16.4 seconds — ABNORMAL HIGH (ref 11.6–15.2)
Prothrombin Time: 18.8 seconds — ABNORMAL HIGH (ref 11.6–15.2)
Prothrombin Time: 19.3 seconds — ABNORMAL HIGH (ref 11.6–15.2)
Prothrombin Time: 22.6 seconds — ABNORMAL HIGH (ref 11.6–15.2)
Prothrombin Time: 13.1 seconds (ref 11.6–15.2)

## 2010-07-02 LAB — URINALYSIS, ROUTINE W REFLEX MICROSCOPIC
Bilirubin Urine: NEGATIVE
Glucose, UA: NEGATIVE mg/dL
Hgb urine dipstick: NEGATIVE
Ketones, ur: NEGATIVE mg/dL
Nitrite: NEGATIVE
Protein, ur: NEGATIVE mg/dL
Specific Gravity, Urine: 1.011 (ref 1.005–1.030)
Urobilinogen, UA: 0.2 mg/dL (ref 0.0–1.0)
pH: 7 (ref 5.0–8.0)

## 2010-07-02 LAB — BASIC METABOLIC PANEL
BUN: 10 mg/dL (ref 6–23)
BUN: 6 mg/dL (ref 6–23)
CO2: 27 mEq/L (ref 19–32)
CO2: 30 mEq/L (ref 19–32)
Calcium: 7.6 mg/dL — ABNORMAL LOW (ref 8.4–10.5)
Calcium: 7.8 mg/dL — ABNORMAL LOW (ref 8.4–10.5)
Chloride: 100 mEq/L (ref 96–112)
Chloride: 103 mEq/L (ref 96–112)
Creatinine, Ser: 0.58 mg/dL (ref 0.4–1.2)
Creatinine, Ser: 0.63 mg/dL (ref 0.4–1.2)
GFR calc Af Amer: >60 mL/min (ref >60)
GFR calc Af Amer: >60 mL/min (ref >60)
GFR calc non Af Amer: >60 mL/min (ref >60)
GFR calc non Af Amer: >60 mL/min (ref >60)
Glucose, Bld: 131 mg/dL — ABNORMAL HIGH (ref 70–99)
Glucose, Bld: 132 mg/dL — ABNORMAL HIGH (ref 70–99)
Potassium: 3.5 mEq/L (ref 3.5–5.1)
Potassium: 3.6 mEq/L (ref 3.5–5.1)
Sodium: 135 mEq/L (ref 135–145)
Sodium: 136 mEq/L (ref 135–145)

## 2010-07-02 LAB — GLUCOSE, CAPILLARY
Glucose-Capillary: 112 mg/dL — ABNORMAL HIGH (ref 70–99)
Glucose-Capillary: 91 mg/dL (ref 70–99)

## 2010-07-02 LAB — APTT: aPTT: 32 seconds (ref 24–37)

## 2010-07-08 LAB — POCT I-STAT, CHEM 8
BUN: 11 mg/dL (ref 6–23)
Calcium, Ion: 1.13 mmol/L (ref 1.12–1.32)
Chloride: 103 mEq/L (ref 96–112)
Creatinine, Ser: 0.7 mg/dL (ref 0.4–1.2)
Glucose, Bld: 114 mg/dL — ABNORMAL HIGH (ref 70–99)
HCT: 46 % (ref 36.0–46.0)
Hemoglobin: 15.6 g/dL — ABNORMAL HIGH (ref 12.0–15.0)
Potassium: 3.3 mEq/L — ABNORMAL LOW (ref 3.5–5.1)
Sodium: 140 mEq/L (ref 135–145)
TCO2: 26 mmol/L (ref 0–100)

## 2010-07-08 LAB — POCT CARDIAC MARKERS
CKMB, poc: <1 ng/mL — ABNORMAL LOW (ref 1.0–8.0)
Myoglobin, poc: 54 ng/mL (ref 12–200)
Troponin i, poc: <0.05 ng/mL (ref 0.00–0.09)

## 2010-08-05 NOTE — Discharge Summary (Signed)
[name], Nicole Cabrera               ACCOUNT NO.:  [id number]   MEDICAL RECORD NO.:  [id number]          PATIENT TYPE:  INP   LOCATION:  1602                         FACILITY:  [facility]   PHYSICIAN:  Ollen Gross, M.D.    DATE OF BIRTH:  09/14/1945   DATE OF ADMISSION:  07/09/2008  DATE OF DISCHARGE:  07/13/2008                               DISCHARGE SUMMARY   ADMITTING DIAGNOSES:  1. Osteoarthritis, left knee.  2. Hypertension.  3. Allergic rhinitis.  4. Obstructive sleep apnea, using CPAP.  5. __________ .  6. Early cataracts.  7. History of seasonal bronchitis.  8. Hemorrhoids.  9. History of renal calculi.  10.Peripheral neuropathy.  11.Postmenopausal.  12.Borderline glucose intolerance.  13.Degenerative disk disease.   DISCHARGE DIAGNOSES:  1. Osteoarthritis, left knee, status post left total knee replacement      arthroplasty.  2. Postoperative volume overload with pleural effusions.  3. Postoperative hypoxia secondary to #2.  4. Hypertension.  5. Allergic rhinitis.  6. Obstructive sleep apnea, using CPAP.  7. __________ .  8. Early cataracts.  9. History of seasonal bronchitis.  10.Hemorrhoids.  11.History of renal calculi.  12.Peripheral neuropathy.  13.Postmenopausal.  14.Borderline glucose intolerance.  15.Degenerative disk disease.  16.Postoperative subsegmental atelectasis.   PROCEDURE:  July 09, 2008:  Left total knee, surgeon Dr. Lequita Halt,  assistant Avel Peace, PA-C.  Surgery under spinal anesthesia with  Duramorph added.  Tourniquet time 30 minutes.   CONSULTATIONS:  None.   BRIEF HISTORY:  Nicole Cabrera is a 65 year old female with a history of  arthritis of the left knee with progressively worsening pain and  dysfunction, failed operative management, and now presents for a total  knee arthroplasty.   LABORATORY DATA:  Preop CBC showed a hemoglobin of 13.7, hematocrit  40.7, white cell count 9.1, platelets 273.  PT/INR 13.1 and 1.0, PTT of  32.   Chem panel on admission all within normal limits with the exception  of low albumin of 3.3.  Preop UA was negative.  Postop hemoglobin down  to 10.8 and was stabilized, last H and H 10.6 and 31.2.  PT/INRs  followed daily.  Last noted PT/INR 22.6 and 1.9.  Serial BMETs were  followed.  Electrolytes remained within normal limits.   X-RAYS:  Two-view chest dated May 03, 2008:  No active lung  disease, borderline cardiomegaly.  Follow-up chest on July 12, 2008  showed mild progression of vascular congestion, small pleural effusions,  mild fluid overload.  CT angio tests:  Chest done July 12, 2008, no  specific features to suggest acute pulmonary embolus, some mild  congestive heart failure suspected, bibasilar atelectasis, multifocal  non calcified pulmonary nodules, intermediate, follow-up exam in 3  months was recommended.  Follow-up chest on July 13, 2008:  Cardiomegaly, vascular congestion, right subsegmental atelectasis,  decrease in pleural effusions, very minimal left pleural effusion.   HOSPITAL COURSE:  The patient was admitted to [facility] and  taken to the OR, underwent the above-stated procedure without  complication.  The patient tolerated the procedure well, later  transferred to the recovery room  and orthopedic floor.  Started on PCA  and p.o. analgesic for pain control following surgery.  Not using much  of the PCA through the night and morning of day 1, so we discontinued  that and encouraged her to use p.o. pills.  She was given 24 hours  postop IV antibiotics.  Did not get much rest on that first night.  Hemoglobin was stable.  Resumed her home meds.  Lisinopril was on hold  due to the ACE inhibitor portion.  Used her CPAP at night.  She did have  a little bit of cough and congestion during the procedure and also on  postop day #1.  Monitored and decided on chest x-ray if she had  increasing secretions and also temperature.  We added a little bit of   Claritin and/or Zyrtec.  She did have a history of seasonal bronchitis  in the past and wanted to make sure she was not getting aggravated by  some of the pollen and current seasonal effects of the environment.  By  day 2 she was doing a little bit better.  Hemoglobin was stable.  Blood  pressure was stable and dressing was changed.  Incision looked good.  She was starting to wean off of the O2, although after being off the O2,  her O2 sats dropped a little bit.  She was seen on rounds on day 3 and  there had been some drop in her O2 sats during the evening and into the  next morning.  Hemoglobin was stable at 10.6.  We checked a chest x-ray  which did show vascular congestion consistent more with mild fluid  overload and pleural effusions.  She was given Lasix.  She diuresed  extremely well with the Lasix, although we did check a CT angio just to  make sure there was no other component, especially PE.  The chest CT was  negative but did confirm the effusions and some subsegmental  atelectasis.  She was encouraged to use incentive spirometer.  By the  following day, her follow-up chest x-ray was improved.  O2 sats had  improved.  She was tolerating p.o. meds, in better spirits, and actually  progressing well with therapy, and she was discharged home on July 13, 2008.   DISCHARGE PLAN:  1. Patient discharged home on July 13, 2008.  2. Discharge diagnoses:  Please see above.   DISCHARGE MEDICATIONS:  Percocet, Robaxin, Coumadin.   FOLLOWUP:  2 weeks.   ACTIVITY:  She is weightbearing as tolerated left lower extremity.  Home  health PT,  home health nursing, total knee protocol.  Follow up in 2  weeks.   DIET:  Heart-healthy diet.   DISPOSITION:  Home.   CONDITION ON DISCHARGE:  Improved.      [name], P.A.C.      Ollen Gross, M.D.  Electronically Signed    ALP/MEDQ  D:  07/13/2008  T:  07/13/2008  Job:  [id number]   cc:   Wyvonnia Lora  Fax:  [phone]

## 2010-08-05 NOTE — H&P (Signed)
Nicole Cabrera, Nicole Cabrera               ACCOUNT NO.:  [id number]   MEDICAL RECORD NO.:  [id number]          PATIENT TYPE:  INP   LOCATION:  1602                         FACILITY:  [facility]   PHYSICIAN:  Ollen Gross, M.D.    DATE OF BIRTH:  04/16/1945   DATE OF ADMISSION:  07/09/2008  DATE OF DISCHARGE:                              HISTORY & PHYSICAL   CHIEF COMPLAINT:  Left knee pain.   HISTORY OF PRESENT ILLNESS:  The patient is a 65 year old female who has  been seen by Ollen Gross, M.D. for ongoing left knee pain.  She has  previously undergone a right total knee back in June of 2008.  Her left  knee continues to be symptomatic and problematic though, she is known to  have end-stage arthritis.  It was felt she would benefit from undergoing  surgical intervention.  The risks and benefits have been discussed.  She  elects to proceed with surgery.   ALLERGIES:  PENICILLIN CAUSES RASH.  TETRACYCLINE CAUSES RASH.  ERYTHROMYCIN CAUSES RASH.  INTOLERANCES WITH VISTARIL WILD AND CLIMBING  THE WALLS.   CURRENT MEDICATIONS:  Centrum Woman's vitamin, Co-Q 10 enzyme, B  complex, calcium plus D, lisinopril, hydrochlorothiazide.   PAST MEDICAL HISTORY:  1. Hypertension.  2. Obstructive sleep apnea, uses CPAP.  3. Obesity.  4. Early cataracts.  5. History of bronchitis.  6. History of hemorrhoids.  7. History of renal calculi.  8. Peripheral neuropathy.  9. Postmenopausal.  10.Borderline glucose intolerance.  11.Degenerative disk disease.   PAST SURGICAL HISTORY:  1. Left wrist surgery.  2. Uterine polyp removal.  3. Cervical disk surgery.  4. Cesarean section.  5. D and C times two.  6. Right total knee.   FAMILY HISTORY:  Father deceased at 55 with MI.  Mother, multiple  medical problems including colon cancer, heart disease.   SOCIAL HISTORY:  Nonsmoker.  Social intake of alcohol.  Three children.   REVIEW OF SYSTEMS:  GENERAL:  No fevers, chills or night sweats.  NEURO:  No  seizures, syncope or paralysis.  RESPIRATORY:  No shortness breath,  productive cough or hemoptysis.  CARDIOVASCULAR:  No chest pain or  orthopnea.  GI:  A little bit of diarrhea a couple weeks prior to the  surgery, but that already has improved by the time of the H and P.  No  nausea, vomiting, constipation.  No bloody mucus in the stool.  GU:  No  dysuria, hematuria or discharge.  MUSCULOSKELETAL:  Left knee.   PHYSICAL EXAMINATION:  VITAL SIGNS:  Pulse 76, respirations 14, blood  pressure 136/84.  GENERAL:  A 65 year old white female well-nourished, well-developed,  overweight, obese, no acute distress.  She is alert, oriented,  cooperative, pleasant and accompanied by her husband.  HEENT:  Normocephalic and atraumatic.  Pupils are round and reactive.  EOMs intact.  Noted to wear glasses.  NECK:  Supple.  CHEST:  Clear anterior and posterior chest walls.  No rhonchi, rales or  wheezing.  HEART:  Regular rate and rhythm.  No murmur.  S1 and S2 noted.  ABDOMEN:  Soft, large round protuberant abdomen, bowel sounds present.  BREASTS/RECTAL/GENITALIA:  Not done, not pertinent to present illness.  EXTREMITIES:  Left knee range as 5-120, marked crepitus, tender over  medial and lateral.  No instability.   IMPRESSION:  Osteoarthritis, left knee.   PLAN:  The patient was admitted to [facility] to undergo a  left total knee replacement arthroplasty.  Surgery will be performed by  Ollen Gross, M.D.      [name], P.A.C.      Ollen Gross, M.D.  Electronically Signed    ALP/MEDQ  D:  07/08/2008  T:  07/09/2008  Job:  [id number]   cc:   Wyvonnia Lora  Fax: [phone]

## 2010-08-05 NOTE — Op Note (Signed)
NAMEMARJEAN, IMPERATO               ACCOUNT NO.:  [id number]   MEDICAL RECORD NO.:  [id number]          PATIENT TYPE:  INP   LOCATION:  0001                         FACILITY:  [facility]   PHYSICIAN:  Ollen Gross, M.D.    DATE OF BIRTH:  07-28-1945   DATE OF PROCEDURE:  07/09/2008  DATE OF DISCHARGE:                               OPERATIVE REPORT   PREOPERATIVE DIAGNOSIS:  Osteoarthritis, left knee.  ]   POSTOPERATIVE DIAGNOSIS:  Osteoarthritis, left knee.  ]   PROCEDURE:  Left total knee arthroplasty.   SURGEON:  Ollen Gross, M.D.   ASSISTANT:  Avel Peace PA-C   ANESTHESIA:  Spinal with Duramorph.   ESTIMATED BLOOD LOSS:  Minimal.   DRAINS:  None.   TOURNIQUET TIME:  38 minutes at 300 mmHg.   COMPLICATIONS:  None.   CONDITION:  Stable to recovery room.   CLINICAL NOTE:  Miasha is a 65 year old female with end-stage arthritis  of the left knee with progressively worsening pain and dysfunction.  She  had a previous successful right total knee arthroplasty and presents now  for left total knee arthroplasty.   PROCEDURE IN DETAIL:  After successful administration of spinal  anesthetic, tourniquet was placed high on the left thigh and left lower  extremity prepped and draped in the usual sterile fashion.  Extremities  wrapped in Esmarch, knee flexed, tourniquet inflated to 300 mmHg.  Midline incision made with a 10 blade through subcutaneous tissue to the  level of the extensor mechanism.  A fresh blade is used make a medial  parapatellar arthrotomy.  Soft tissue over the proximal medial tibia is  subperiosteally elevated to the joint line with the knife and into the  semimembranosus bursa with a Cobb elevator.  Soft tissue laterally is  elevated with attention being paid to avoid patellar tendon on tibial  tubercle.  The patella subluxed laterally, knee flexed 90 degrees, ACL  and PCL removed.  Drill was used to create a starting hole in the distal  femur and the canal  was thoroughly irrigated.  The 5 degrees left valgus  alignment guide is placed referencing off posterior condyles, rotations  marked and a block pinned to remove 10 mm off the distal femur.  Distal  femoral resection is made with an oscillating saw.  Sizing blocks placed  and size 2 is most appropriate.  Rotations marked at the epicondylar  axis.  Size 2 cutting blocks placed and the anterior, posterior and  chamfer cuts made.   Tibia subluxed forward and menisci removed.  Extramedullary tibial  alignment guide is placed referencing proximally at the medial aspect of  tibial tubercle and distally along the second metatarsal axis and tibial  crest.  Blocks pinned to remove 10 mm off the non deficient lateral  side.  Tibial resection is made with an oscillating saw.  Size 2  is the  most appropriate tibial component.  I used size 2 MBT revision tray  given the small size of her bone and large size of her leg to get a  little more fixation into the tibial canal.  We then prepared for the  MBT revision tray with the proximal reamer.  And then placed the size 2  into the keel punch for the size 2 with excellent fit.  The femoral  preparation is completed with the intercondylar cut.   Size 2 mobile bearing tibial trial, size 2 posterior stabilized femoral  trial and a 10-mm posterior stabilized rotating platform insert trial  were placed.  With a 10 there was a little hyperextension so I went to  12.5 which allowed for full extension with excellent varus-valgus and  the anterior-posterior balance throughout full range of motion.  The  patella was everted and thickness measured to be 20 mm.  Freehand  resection taken to 12 mm, 35 template is placed, lug holes were drilled,  trial patella was placed and it tracks normally.  Osteophytes removed  off the posterior femur with the trial in place.  All trials were  removed and the cut bone surfaces are prepared with pulsatile lavage.  Cement is  mixed and once ready for implantation, the size 2 MBT revision  tray size 2 posterior stabilized femur and 32 patella are cemented into  place.  The patella is held with a clamp.  Trial 12.5-mm insert is  placed, knee held in full extension and all extruded cement removed.  When the cement was fully hardened then the permanent 12.5 mm posterior  stabilized rotating platform insert is placed in the tibial tray.  Wound  was copiously irrigated with saline solution and then the FloSeal  injected on the posterior capsule, medial and lateral gutters and  suprapatellar area.  Moist sponge is placed and tourniquet released for  total time of 38 minutes.  Sponge is held for 2 minutes then removed.  Minimal bleeding was encountered.  The bleeding that is encountered is  stopped with electrocautery.  The wound is further irrigated and then  the arthrotomy closed with interrupted #1 PDS.  Flexion against gravity  to 130 degrees at which point the leg and calf were meeting.  The subcu  is then closed interrupted 2-0 Vicryl and subcuticular running 4-0  Monocryl.  The incisions cleaned and dried and Steri-Strips and bulky  sterile dressing applied.  She is then placed into a knee immobilizer,  awakened and transferred to recovery in stable condition.      Ollen Gross, M.D.  Electronically Signed     FA/MEDQ  D:  07/09/2008  T:  07/09/2008  Job:  [id number]

## 2010-08-05 NOTE — Op Note (Signed)
Nicole Cabrera, Nicole Cabrera               ACCOUNT NO.:  [id number]   MEDICAL RECORD NO.:  [id number]          PATIENT TYPE:  INP   LOCATION:  X001                         FACILITY:  [facility]   PHYSICIAN:  Ollen Gross, M.D.    DATE OF BIRTH:  June 22, 1945   DATE OF PROCEDURE:  09/06/2006  DATE OF DISCHARGE:                               OPERATIVE REPORT   PREOPERATIVE DIAGNOSIS:  Osteoarthritis right knee.   POSTOPERATIVE DIAGNOSIS:  Osteoarthritis right knee.   PROCEDURE:  Right total knee arthroplasty.   SURGEON:  Ollen Gross, M.D.   ASSISTANT:  [name], P.A.C.   ANESTHESIA:  Spinal.   ESTIMATED BLOOD LOSS:  Minimal.   DRAIN:  None.   TOURNIQUET TIME:  43 minutes at 350 mmHg.   COMPLICATIONS:  None.   CONDITION:  Stable to recovery.   BRIEF CLINICAL NOTE:  Ms. Woulfe is a 65 year old female with severe end-  stage arthritis of the right knee with progressively worsening pain and  dysfunction.  She has failed nonoperative management and presents for  total knee arthroplasty.   PROCEDURE IN DETAIL:  After successful administration of spinal  anesthetic, a tourniquet was placed on her right thigh; and the right  lower extremity prepped and draped in the usual sterile fashion.  Extremity was wrapped in Esmarch, knee flexed, tourniquet inflated to  350 mmHg.  Midline incision made with a 10-blade through the  subcutaneous tissue to a level of the extensor mechanism.  Fresh blade  was used make a medial parapatellar arthrotomy.  The soft tissue over  the proximal medial tibia was subperiosteally elevated to the joint line  with the knife and into the semimembranosus bursa with the Cobb  elevator.  Soft tissue laterally was elevated with attention being paid  to avoid the patellar tendon on tibial tubercle.  The patella subluxed  laterally, knee flexed 90 degrees, ACL and PCL removed.  A drill was  used to create a starting hole in the distal femur; and the canal  was  thoroughly irrigated.  The 5-degree right valgus alignment guide was  placed and referencing off the posterior condyles; rotation was marked,  and the block pinned to remove 10 mm off the distal femur.  Distal femur  resection was made with an oscillating saw.  Sizing block was placed; a  size 2 was most appropriate.  Rotation was marked at the epicondylar  axis; and the size 2 cutting block placed.  The anterior, posterior, and  chamfer cuts are made.   Tibia was subluxed forward and menisci removed.  Extramedullary tibial  alignment guide was placed; referencing proximally at the medial aspect  of the tibial tubercle, and distally along the second metatarsal axis at  the tibial crest.  The block was pinned to remove 10 mm of the  nondeficient lateral side.  We elected to go an additional 2 mm to get  to the base of the tibial defect.  Given her body habitus and relatively  small modest-sized bone, I opted to do an MBT revision, tibial tray, so  as to get some metaphyseal  fixation and avoid any risk of subsidence.  We then prepared the proximal tibia with the modular drill and the drill  for the MBT revision tray and reamed distally to 13 mm for placement of  a 13 x 30 stem extension.  The trial was placed with excellent fit; and  then we used a keel punch to prepare proximally.  The femoral  preparation is completed with the intercondylar cut.   A size 2 MBT revision tray, 13 x 30 stem extension, and the size 2  posterior stabilized femoral trial were placed.  A 12.5 mm posterior  stabilized rotating platform insert trial was placed, a size 2.  Full  extension was achieved with excellent varus and valgus balance  throughout full range of motion.  There is also excellent AP balance.  The patella was then everted, and thickness measured to be 19 mm.  Freehand resection was taken at 12 mm, a 32 template was placed, lug  holes were drilled, trial patella was placed and it tracks  normally.   Osteophytes were removed off the posterior femur with the trial in  place.  All trials are removed; and the cut bone surfaces are prepared  with pulsatile lavage.  Cement was mixed; and once ready for  implantation, the size 2 MBT revision tray was cemented into place.  I  injected down the cement into the canal; and had used the cement  restricter.  The size 2 posterior stabilized femur was also cemented in  place; and a 12.5 mm trial insert was placed.  The knee was held in full  extension; and all extruded cement removed.  A 32 patella was then  cemented and held with a clamp.  Once the cement was fully hardened,  then the wound was copiously irrigated; and the FloSeal injected onto  the posterior capsule.   The permanent 12.5 mm posterior stabilized rotating platform insert was  then placed into the tibial tray.  The FloSeal was then placed in the  medial and lateral gutters; and suprapatellar area.  Tourniquet was  released at a total time of 43 minutes.  Minor bleeding was stopped with  cautery.  The wound again irrigated and the extensor mechanism closed  with interrupted #1 PDS.  Flexion against gravity was 125-degrees; at  which point the calf and posterior thigh are meeting.  The subcu was  closed with interrupted 2-0 Vicryl and subcuticular running with 4-0  Monocryl.  Incision was cleaned and dried and Steri-Strips and a bulky  sterile dressing applied.  She was placed into a knee immobilizer,  awakened and transported to recovery in stable condition.      Ollen Gross, M.D.  Electronically Signed     FA/MEDQ  D:  09/06/2006  T:  09/06/2006  Job:  [id number]

## 2010-08-05 NOTE — Assessment & Plan Note (Signed)
[facility]                             PULMONARY OFFICE NOTE   NAME:Nicole Cabrera, Nicole Cabrera                      MRN:          [id number]  DATE:02/18/2007                            DOB:          Dec 04, 1945    PROBLEM:  A 65 year old woman with sleep apnea seeking to re-establish.   HISTORY:  Nicole Cabrera has a long history of sleep apnea with original  symptoms of snoring and excessive daytime sleepiness.  On April 22  liters 1987, she had a diagnostic sleep study with an AHI of 51  obstructive events per hour.  At that time, she weighed 160 pounds.  CPAP was titrated to 15, never with good control because of central  events but she has continued at that pressure.  Her machine has been  provided through [employer] and is now wearing out being at least  66 years old.  Life is definitely better using CPAP which she does  regularly and she denies routine snoring or daytime sleepiness.  Bedtime  is 9 p.m. to 10 p.m. with variable sleep latency, waking at least once  during the night before finally up between 6:30 and 7 a.m.   CURRENT MEDICATIONS:  Lisinopril/HCTZ, calcium with vitamin D, CoQ-10,  multivitamin, CPAP 15-CWP.   DRUG INTOLERANT:  1. LATEX.  2. VISTARIL.  3. TETRACYCLINE.  4. PENICILLIN.  5. ERYTHROMYCIN.   REVIEW OF SYMPTOMS:  Sometimes some crawling feeling in the legs before  she falls asleep, weight gain.  She denies snoring and daytime  sleepiness, chest pain, or palpitations, active reflux, or any recent  sudden events.   PAST HISTORY:  1. Obstructive sleep apnea.  2. Allergic rhinitis.  3. Seasonal bronchitis. She was skin test positive and on allergy      vaccine addressing the rhinitis in 1987.  She has had no ENT      surgery.  Now admits only occasional colds.  4. Treated for hypertension.  Not aware of any heart or lung disease.   SURGERIES:  Have included:  1. Back surgery.  2. Tubal ligation.  3. Two D&Cs.  4.  Broken wrist repair.  5. Total knee replacement in June 2008.   SOCIAL HISTORY:  Never smoked.  Occasional alcohol.  She is married with  grown children.  About 1 cup of coffee a day.   FAMILY HISTORY:  She thinks both parents probably had sleep apnea.  They  both snored loudly.  Grandfather with emphysema.  Mother and father with  heart disease.   OBJECTIVE:  VITAL SIGNS:  Weight 234 pounds, BP 142/90, pulse regular  72, room air saturation 95%.  GENERAL:  This is an obese, alert woman, using a cane for ambulation  after knee surgery.  NEUROLOGIC:  Unremarkable to observation with no tremor or restlessness  evident.  She did not appear sleepy.  HEENT:  There is some mucus-crusting in the nose without obstruction.  Palpate spacing 2-3/4 without erythema.  Voice quality normal.  No  stridor, thyromegaly, or neck vein distention.  CHEST:  Quiet clear lung sounds.  Unlabored breathing.  HEART:  Sounds regular without murmur or gallop.  EXTREMITIES:  Without cyanosis, clubbing, or edema.   IMPRESSION:  Obstructive sleep apnea, well documented in the past.  Her  CPAP machine is wearing out but we also want to reassess the pressure,  particularly in the face of significant weight gain in recent years.  We  have discussed management including importance of weight loss and her  responsibility to drive safely.   PLAN:  I am ordering replacement CPAP to continue with sating of 15-CWP,  meanwhile we will re-titrate for appropriate pressure evaluation and can  change her pressure setting if needed.  Hopefully after her knee surgery  heals, she be able to be more active and could be more successful at  getting her weight back down.  I am scheduling to see her in a year,  earlier p.r.n. encouraging phone contact if there are questions or  problems and of course, we will be happy to see her earlier as needed.     Clinton D. Maple Hudson, MD, Tonny Bollman, FACP  Electronically Signed    CDY/MedQ  DD:  02/18/2007  DT: 02/18/2007  Job #: [id number]   cc:   Nicole Cabrera

## 2010-08-05 NOTE — H&P (Signed)
Nicole Cabrera, Nicole Cabrera               ACCOUNT NO.:  [id number]   MEDICAL RECORD NO.:  [id number]          PATIENT TYPE:   LOCATION:                                 FACILITY:   PHYSICIAN:  Ollen Gross, M.D.    DATE OF BIRTH:  [birth year]   DATE OF ADMISSION:  09/06/2006  DATE OF DISCHARGE:                              HISTORY & PHYSICAL   CHIEF COMPLAINT:  Right knee pain.   HISTORY OF PRESENT ILLNESS:  This is a 65 year old female who has seen  by Dr. Lequita Halt for ongoing right knee pain.  It has been ongoing for  quite some time now.  She has known arthritis.  She has known arthritis  in both knees, but it is more symptomatic problematic in the right knee.  She has discussed knee replacement surgery in the past.  She had been  treated conservatively in the past including injections.  She continues  to have pain despite conservative measures.  It is felt she could  benefit undergoing surgical intervention.  Risks and benefits have been  discussed and she elects to proceed with surgery.   ALLERGIES:  PENICILLIN CAUSES RASH, TETRACYCLINE CAUSES RASH,  ERYTHROMYCIN CAUSES RASH.  INTOLERANCES TO VISTARIL WILD AND CLIMBING  WALLS.   CURRENT MEDICATIONS:  1. Centrum Silver.  2. Co-Q 10 enzyme.  3. B complex.  4. Calcium plus D.  5. Lisinopril.  6. Hydrochlorothiazide.   PAST MEDICAL HISTORY:  1. Hypertension.  2. Obstructive sleep apnea which she uses the CPAP for.  3. Obesity.  4. History of bronchitis.  5. History of hemorrhoids.  6. History of renal calculi.  7. Peripheral neuropathy.  8. Postmenopausal.   PAST SURGICAL HISTORY:  1. Left wrist surgery.  2. Uterine polyp removal.  3. Cervical disk surgery.  4. Cesarean section.  5. Two D&C's.   FAMILY HISTORY:  Father deceased at age 11 with MI.  Mother multiple  medical problems including colon cancer, heart disease.   SOCIAL HISTORY:  Married, nonsmoker.  Occasional social intake of  alcohol.  Has three  children.   REVIEW OF SYSTEMS:  GENERAL:  No fevers, chills or night sweats.  NEUROLOGICAL:  She does have peripheral neuropathy.  No seizures or  syncope or paralysis.  RESPIRATORY:  No shortness breath, productive  cough or hemoptysis.  CARDIOVASCULAR:  No chest pain, angina or  orthopnea.  GI: No nausea, vomiting, diarrhea, constipation.  GU: No  dysuria, hematuria, discharge.  MUSCULOSKELETAL:  Right knee.   PHYSICAL EXAMINATION:  VITAL SIGNS:  Pulse 64, respirations 12, blood  pressure 122/72.  GENERAL:  A 66 year old white female, well-nourished, well-developed,  overweight, obese hip and thigh obesity.  Alert and cooperative,  excellent historian.  She is accompanied by her husband.  HEENT:  Normocephalic, atraumatic.  Pupils are round and reactive.  Oropharynx clear.  EOMs intact.  NECK:  Supple.  CHEST:  Clear anterior posterior chest walls.  No rhonchi, rales or  wheezing.  HEART:  Regular rate and rhythm.  No murmur.  ABDOMEN:  Soft, round protuberant abdomen.  Bowel sounds present.  RECTAL/BREASTS/GU:  Not done, not pertinent to present illness.  EXTREMITIES:  Right knee shows range of motion from 5-115, marked  crepitus noted.  No instability.   IMPRESSION:  1. Osteoarthritis right knee.  2. Hypertension.  3. Obstructive sleep apnea, uses CPAP.  4. Obesity.  5. History of bronchitis.  6. History hemorrhoids.  7. History of renal calculi.  8. Peripheral neuropathy.  9. Postmenopausal.   PLAN:  The patient will be admitted to [facility] and will  undergo right total knee replacement arthroplasty.  Surgery will be  performed by Dr. Ollen Gross.      [name], P.A.C.      Ollen Gross, M.D.  Electronically Signed   ALP/MEDQ  D:  09/05/2006  T:  09/06/2006  Job:  [id number]   cc:   Wyvonnia Lora  Fax: [phone]   Ollen Gross, M.D.  Fax: [phone]

## 2010-08-08 NOTE — Op Note (Signed)
[facility]  Patient:    Nicole Cabrera, Nicole Cabrera                      MRN: [id number] Proc. Date: 07/26/00 Adm. Date:  [id number] Attending:  Brynda Peon                           Operative Report  PREOPERATIVE DIAGNOSES:  Postmenopausal bleeding, probable endometrial polyp.  POSTOPERATIVE DIAGNOSES:  Postmenopausal bleeding, probable endometrial polyp.  PATHOLOGY:  Pending.  PROCEDURE:  Hysteroscopy, dilation and curettage, hysteroscopic resection of endometrial polyp.  SURGEON:  Dr. Amedeo Kinsman.  ANESTHESIA:  General endotracheal.  ESTIMATED BLOOD LOSS:  Minimal.  COMPLICATIONS:  None.  DESCRIPTION OF PROCEDURE:  The patient was taken to the operating room and after the induction of adequate general endotracheal anesthesia was placed in the dorsal lithotomy position and prepped and draped in the usual fashion. The bladder was drained with a red rubber catheter. The cervix was grasped from its anterior lip with a single tooth tenaculum and it was sounded to 9 cm. The cervix was dilated to a #27 Shawnie Pons without difficulty. The diagnostic hysteroscope was introduced. The endocervix was clear. The lower uterine segment was normal in the fundus. From the cornu, there was a flat polyp. Photographic documentation was taken. The remainder of the fundus appeared normal. The scope was removed, the polyp forceps were inserted and a part of the polyp was removed but with reinsertion of the diagnostic scope, it was noted that the basal segment of the polyp was still present. Sharp curettage was then carried out in attempt to remove the basal segment of that polyp. The diagnostic scope was then reintroduced but the part of the polyp was still there. Therefore, the cervix was then dilated to a #31 Pratt. The operative hysteroscope was introduced and the loop was used to excise the base of the polyp off the right cornu. Instruments removed from the vagina  and the procedure was terminated. The patient tolerated it well and went in satisfactory condition to post anesthesia recovery. The sorbitol deficit was zero. DD:  07/26/00 TD:  07/27/00 Job: [id number]

## 2010-08-08 NOTE — Discharge Summary (Signed)
NAME:  Nicole Cabrera, Nicole Cabrera                         ACCOUNT NO.:  [id number]   MEDICAL RECORD NO.:  [id number]                   PATIENT TYPE:  INP   LOCATION:  3304                                 FACILITY:  [facility]   PHYSICIAN:  Dorna Leitz, M.D.                 DATE OF BIRTH:  01-02-1946   DATE OF ADMISSION:  11/09/2001  DATE OF DISCHARGE:  11/12/2001                                 DISCHARGE SUMMARY   DISCHARGE DIAGNOSES:  1. Left posterior epistaxis.  2. Severe obstructive sleep apnea.   HOSPITAL COURSE:  The patient was initially evaluated in the emergency room  on 11/09/01.  The patient had taken a baby aspirin.  No inciting event of  epistaxis was elicited.  The patient had rebleeding.  She required packing  in the emergency room by Dr. Bruce Donath.  This resulted in cessation of  bleeding.  The patient was then admitted to the hospital to ensure no  recurrence of bleeding, and for close airway monitoring as the patient does  suffer from severe obstructive sleep apnea.   The patient did have coagulation studies which were normal with a PT of  13.5, and a PTT of 30.  Platelets were normal at 296, and hemoglobin was  14.4.  The patient did have a slightly elevated blood pressure at 166/84.   The patient was monitored in the intensive care unit setting.  There were no  periods of desaturation.  Blood pressure was approximately 130 to 140/60 to  70.  The patient remained in the hospital until 11/12/01.  The day prior to  discharge, air was removed from the balloon catheter.  The catheter was then  removed.  Overnight, the patient was monitored and observed, and there was  no recurrent bleeding.  The following morning, the left nasal cavity was  debrided, and the patient was discharged to home.   DISPOSITION:  To home.   DISCHARGE MEDICATIONS:  1. The patient was instructed that she may resume a baby aspirin in     approximately three days, and she is to complete her Zithromax  course.  2. She is to use Afrin two squirts in her left nasal cavity b.i.d. x3 days.  3. She is to use saline nasal spray to the left nasal cavity at least six     times per day, and avoid any nose blowing for at least two days.  4.     She may resume her CPAP machine, ensuring there is humidification.  5. She is to use Tylenol for discomfort, and no ibuprofen.   FOLLOWUP:  Return appointment is in two weeks at the [place] office with  Dr. Lucky Cowboy.  Dorna Leitz, M.D.    SLJ/MEDQ  D:  11/12/2001  T:  11/15/2001  Job:  [id number]   cc:   G[facility]   Charlena Cross, M.D.  [place], [place]

## 2010-08-08 NOTE — Discharge Summary (Signed)
NAMEJAHNIYA, DUZAN               ACCOUNT NO.:  [id number]   MEDICAL RECORD NO.:  [id number]          PATIENT TYPE:  INP   LOCATION:  1616                         FACILITY:  [facility]   PHYSICIAN:  Ollen Gross, M.D.    DATE OF BIRTH:  1946-02-26   DATE OF ADMISSION:  09/06/2006  DATE OF DISCHARGE:  09/09/2006                               DISCHARGE SUMMARY   ADMISSION DIAGNOSES:  1. Osteoarthritis, right knee.  2. Hypertension.  3. Obstructive sleep apnea.  Uses CPAP.  4. Obesity.  5. History of bronchitis.  6. History of hemorrhoids.  7. History of renal calculi.  8. History of peripheral neuropathy.  9. Post menopausal.   DISCHARGE DIAGNOSES:  1. Osteoarthritis right knee status post right total knee replacement      arthroplasty.  2. Mild postop hyponatremia, improving.  3. Mild hypokalemia, improving.  4. Hypertension.  5. Obstructive sleep apnea.  Uses CPAP.  6. Obesity.  7. History of bronchitis.  8. History of hemorrhoids.  9. History of renal calculi.  10.History of peripheral neuropathy.  11.Post menopausal.   PROCEDURE:  On September 06, 2006 right total knee.  Surgeon Dr. Lequita Halt,  assistant Ellwood Dense, premature atrial contractions.  Spinal  anesthesia.   CONSULTANTS:  None.   BRIEF HISTORY:  Ms. Rotenberg is a 65 year old female with severe end-stage  osteoarthritis of the right knee with progressive worsening pain and  dysfunction.  She has failed nonoperative means and now presents for a  total knee arthroplasty.   LABORATORY DATA:  Preop CBC showed a hemoglobin of 14.4, hematocrit of  41.5, white cell count 11.7, postop hemoglobin 11.8.  Hemoglobin came  back up and was last noted at 12.6 and 37.2.  PT/PTT preop 12.5 and 31  respectively.  INR 0.9.  Serial Protimes as follows: PT/INR 19.5 and  1.6.  Chem panel on admission; low albumin of 3.4, slightly low  potassium at 3.4, glucose elevated at 169.  The remaining chem panel  within normal limits.  Sodium  did drop from 141 to 134 back up to 135.  Potassium came back up to 3.7.  Blood type O positive.  Preop UA  negative.  EKG in June 2008; normal sinus rhythm with sinus arrhythmia,  left anterior fascicular block, abnormal, unconfirmed.  Preop chest August 27, 2006; mild COPD, mild cardiac enlargement without heart failure,  bibasilar atelectasis versus scarring.   HOSPITAL COURSE:  Admitted to [facility].  Tolerated procedure  well.  Later transferred to the recovery room and orthopedic floor.  Started on PCA and p.o. analgesics for pain control following surgery.  Given 24 hours postop IV antibiotics.  Did fairly well on the evening of  surgery.  Seen on rounds on the morning of day one to have a low  potassium, therefore, we gave her potassium supplements.  Had been using  the PCA, but encouraged her to switch over to pills.  Starting to get up  out of bed with therapy.  By day two was doing much better.  Had not  been out of bed yet with  therapy, but later that day she did get up and  ambulate short distances in the room.  She was requiring some moderate  assist up with a walker.  Dressing was changed on day two.  Incision  looked good.  Hemoglobin was stable at 12.6.  Sodium stable after  surgery at 134.  We discontinued the fluids.  Encouraged p.o. intake.  She did well though and by the following day she was up ambulating  approximately 55 feet independently.  She was seen in rounds on the  morning of day three and decided she would go home as long as she met  all of her goals.  She did progress well that afternoon with PT  tolerating her meds and was discharged home.   DISCHARGE PLAN:  1. The patient was discharged home on September 09, 2006.  2. Discharge diagnoses:  Please see above.  3. Discharge meds:  Percocet, Robaxin and Coumadin.   DIET:  Low sodium Heart Healthy diet.   FOLLOW UP:  Two weeks.   DISPOSITION:  Home.   ACTIVITY:  Weightbearing as tolerated right  leg.  Home health PT, home  health nursing, total knee protocol.   CONDITION ON DISCHARGE:  Improved.      [name], P.A.C.      Ollen Gross, M.D.  Electronically Signed    ALP/MEDQ  D:  09/30/2006  T:  10/01/2006  Job:  [id number]   cc:   Ollen Gross, M.D.  Fax: [phone]   Wyvonnia Lora  Fax: [phone]

## 2010-12-29 LAB — HM PAP SMEAR: HM Pap smear: NORMAL

## 2010-12-30 ENCOUNTER — Other Ambulatory Visit: Payer: Self-pay | Admitting: Obstetrics & Gynecology

## 2010-12-30 DIAGNOSIS — Z1231 Encounter for screening mammogram for malignant neoplasm of breast: Secondary | ICD-10-CM

## 2011-01-07 LAB — CBC
HCT: 34.8 — ABNORMAL LOW
HCT: 37.2
HCT: 37.2
Hemoglobin: 11.8 — ABNORMAL LOW
Hemoglobin: 12.6
Hemoglobin: 12.6
MCHC: 33.8
MCHC: 33.8
MCHC: 33.9
MCV: 82.9
MCV: 83.3
MCV: 83.6
Platelets: 222
Platelets: 232
Platelets: 233
RBC: 4.18
RBC: 4.45
RBC: 4.48
RDW: 13.7
RDW: 13.8
RDW: 14
WBC: 11.8 — ABNORMAL HIGH
WBC: 12 — ABNORMAL HIGH
WBC: 13.9 — ABNORMAL HIGH

## 2011-01-07 LAB — BASIC METABOLIC PANEL
BUN: 6
BUN: 6
BUN: 9
CO2: 25
CO2: 27
CO2: 29
Calcium: 8 — ABNORMAL LOW
Calcium: 8.1 — ABNORMAL LOW
Calcium: 8.2 — ABNORMAL LOW
Chloride: 102
Chloride: 99
Chloride: 99
Creatinine, Ser: 0.58
Creatinine, Ser: 0.58
Creatinine, Ser: 0.61
GFR calc Af Amer: >60
GFR calc Af Amer: >60
GFR calc Af Amer: >60
GFR calc non Af Amer: >60
GFR calc non Af Amer: >60
GFR calc non Af Amer: >60
Glucose, Bld: 119 — ABNORMAL HIGH
Glucose, Bld: 137 — ABNORMAL HIGH
Glucose, Bld: 151 — ABNORMAL HIGH
Potassium: 3.4 — ABNORMAL LOW
Potassium: 3.7
Potassium: 4.1
Sodium: 134 — ABNORMAL LOW
Sodium: 134 — ABNORMAL LOW
Sodium: 135

## 2011-01-07 LAB — PROTIME-INR
INR: 1.2
INR: 1.3
INR: 1.6 — ABNORMAL HIGH
Prothrombin Time: 15.2
Prothrombin Time: 16.5 — ABNORMAL HIGH
Prothrombin Time: 19.5 — ABNORMAL HIGH

## 2011-01-07 LAB — TYPE AND SCREEN
ABO/RH(D): O POS
Antibody Screen: NEGATIVE

## 2011-01-07 LAB — ABO/RH: ABO/RH(D): O POS

## 2011-01-08 LAB — APTT: aPTT: 31

## 2011-01-08 LAB — COMPREHENSIVE METABOLIC PANEL
ALT: 22
AST: 22
Albumin: 3.4 — ABNORMAL LOW
Alkaline Phosphatase: 65
BUN: 11
CO2: 27
Calcium: 9.1
Chloride: 105
Creatinine, Ser: 0.68
GFR calc Af Amer: >60
GFR calc non Af Amer: >60
Glucose, Bld: 169 — ABNORMAL HIGH
Potassium: 3.4 — ABNORMAL LOW
Sodium: 141
Total Bilirubin: 0.6
Total Protein: 6.2

## 2011-01-08 LAB — URINALYSIS, ROUTINE W REFLEX MICROSCOPIC
Bilirubin Urine: NEGATIVE
Glucose, UA: NEGATIVE
Hgb urine dipstick: NEGATIVE
Ketones, ur: NEGATIVE
Nitrite: NEGATIVE
Protein, ur: NEGATIVE
Specific Gravity, Urine: 1.025
Urobilinogen, UA: 0.2
pH: 5.5

## 2011-01-08 LAB — CBC
HCT: 41.5
Hemoglobin: 14.4
MCHC: 34.6
MCV: 81.4
Platelets: 287
RBC: 5.09
RDW: 13.5
WBC: 11.7 — ABNORMAL HIGH

## 2011-01-08 LAB — PROTIME-INR
INR: 0.9
Prothrombin Time: 12.5

## 2011-01-16 ENCOUNTER — Ambulatory Visit (HOSPITAL_COMMUNITY)
Admission: RE | Admit: 2011-01-16 | Discharge: 2011-01-16 | Disposition: A | Discharge status: Home / Self Care | Payer: 59 | Source: Ambulatory Visit | Attending: Obstetrics & Gynecology | Admitting: Obstetrics & Gynecology

## 2011-01-16 DIAGNOSIS — Z1231 Encounter for screening mammogram for malignant neoplasm of breast: Secondary | ICD-10-CM

## 2011-01-22 ENCOUNTER — Encounter (HOSPITAL_COMMUNITY)
Admission: RE | Admit: 2011-01-22 | Discharge: 2011-01-22 | Disposition: A | Discharge status: Home / Self Care | Payer: 59 | Source: Ambulatory Visit | Attending: Obstetrics & Gynecology | Admitting: Obstetrics & Gynecology

## 2011-01-22 ENCOUNTER — Encounter (HOSPITAL_COMMUNITY): Payer: Self-pay

## 2011-01-22 ENCOUNTER — Other Ambulatory Visit: Payer: Self-pay

## 2011-01-22 HISTORY — DX: Obesity, unspecified: E66.9

## 2011-01-22 HISTORY — DX: Bronchitis, not specified as acute or chronic: J40

## 2011-01-22 HISTORY — DX: Unspecified osteoarthritis, unspecified site: M19.90

## 2011-01-22 HISTORY — DX: Anxiety disorder, unspecified: F41.9

## 2011-01-22 HISTORY — DX: Essential (primary) hypertension: I10

## 2011-01-22 HISTORY — DX: Sleep apnea, unspecified: G47.30

## 2011-01-22 HISTORY — DX: Polyneuropathy, unspecified: G62.9

## 2011-01-22 LAB — CBC
HCT: 40.2 % (ref 36.0–46.0)
Hemoglobin: 13 g/dL (ref 12.0–15.0)
MCH: 28.2 pg (ref 26.0–34.0)
MCHC: 32.3 g/dL (ref 30.0–36.0)
MCV: 87.2 fL (ref 78.0–100.0)
Platelets: 308 10*3/uL (ref 150–400)
RBC: 4.61 MIL/uL (ref 3.87–5.11)
RDW: 14.7 % (ref 11.5–15.5)
WBC: 11.6 10*3/uL — ABNORMAL HIGH (ref 4.0–10.5)

## 2011-01-22 LAB — BASIC METABOLIC PANEL
BUN: 11 mg/dL (ref 6–23)
CO2: 29 mEq/L (ref 19–32)
Calcium: 9 mg/dL (ref 8.4–10.5)
Chloride: 100 mEq/L (ref 96–112)
Creatinine, Ser: 0.74 mg/dL (ref 0.50–1.10)
GFR calc Af Amer: >90 mL/min (ref >90)
GFR calc non Af Amer: 87 mL/min — ABNORMAL LOW (ref >90)
Glucose, Bld: 124 mg/dL — ABNORMAL HIGH (ref 70–99)
Potassium: 3.1 mEq/L — ABNORMAL LOW (ref 3.5–5.1)
Sodium: 137 mEq/L (ref 135–145)

## 2011-01-22 MED ORDER — CIPROFLOXACIN IN D5W 400 MG/200ML IV SOLN
400.0000 mg | INTRAVENOUS | Status: AC
  Administered 2011-01-23: 400 mg via INTRAVENOUS
  Filled 2011-01-22: qty 200

## 2011-01-22 MED ORDER — METRONIDAZOLE IN NACL 5-0.79 MG/ML-% IV SOLN
500.0000 mg | INTRAVENOUS | Status: DC
  Filled 2011-01-22: qty 100

## 2011-01-22 NOTE — Pre-Procedure Instructions (Signed)
Reviewed patient's history with Dr Arby Barrette.  Per Dr Arby Barrette, patient instructed to take BP med with a small sip of water day of surgery, 01/23/11.  Patient also instructed to use CPAP when she goes home from surgery (outpatient procedure) that night.  Patient states she uses it every night.  Patient verbalized understanding.  Dr Arby Barrette wants SS RN to check patients CBG in SS DOS.  EKG done, on chart.  CBC and BMP drawn.

## 2011-01-22 NOTE — Patient Instructions (Signed)
   Your procedure is scheduled on:  Friday, Nov 2nd  Enter through the [employer] of [facility] at:  6:00am Pick up the phone at the desk and dial [phone] and inform us of your arrival.  Please call this number if you have any problems the morning of surgery: [phone]  Remember: Do not eat food after midnight: Thursday Do not drink clear liquids after:  Thursday Take these medicines the morning of surgery with a SIP OF WATER:  Losartan/hctz  Do not wear jewelry, make-up, or FINGER nail polish Do not wear lotions, powders, or perfumes.  You may not  wear deodorant. Do not shave 48 hours prior to surgery. Do not bring valuables to the hospital.  Patients discharged on the day of surgery will not be allowed to drive home.   D/C home with husband Casimiro Needle  cell [phone].  Remember to use your hibiclens as instructed.Please shower with 1/2 bottle the evening before your surgery and the other 1/2 bottle the morning of surgery.

## 2011-01-22 NOTE — Pre-Procedure Instructions (Signed)
Dr Arby Barrette reviewed EKG, OK.

## 2011-01-23 ENCOUNTER — Ambulatory Visit (HOSPITAL_COMMUNITY): Payer: 59 | Admitting: Anesthesiology

## 2011-01-23 ENCOUNTER — Inpatient Hospital Stay (HOSPITAL_COMMUNITY): Admission: RE | Admit: 2011-01-23 | Payer: Self-pay | Source: Ambulatory Visit

## 2011-01-23 ENCOUNTER — Encounter (HOSPITAL_COMMUNITY): Payer: Self-pay | Admitting: Anesthesiology

## 2011-01-23 ENCOUNTER — Encounter (HOSPITAL_COMMUNITY): Payer: Self-pay | Admitting: *Deleted

## 2011-01-23 ENCOUNTER — Encounter (HOSPITAL_COMMUNITY)
Admission: RE | Disposition: A | Discharge status: Home / Self Care | Payer: Self-pay | Source: Ambulatory Visit | Attending: Obstetrics & Gynecology

## 2011-01-23 ENCOUNTER — Ambulatory Visit (HOSPITAL_COMMUNITY)
Admission: RE | Admit: 2011-01-23 | Discharge: 2011-01-23 | Disposition: A | Discharge status: Home / Self Care | Payer: 59 | Source: Ambulatory Visit | Attending: Obstetrics & Gynecology | Admitting: Obstetrics & Gynecology

## 2011-01-23 ENCOUNTER — Other Ambulatory Visit: Payer: Self-pay | Admitting: Obstetrics & Gynecology

## 2011-01-23 DIAGNOSIS — N950 Postmenopausal bleeding: Secondary | ICD-10-CM | POA: Diagnosis present

## 2011-01-23 DIAGNOSIS — Z01818 Encounter for other preprocedural examination: Secondary | ICD-10-CM | POA: Insufficient documentation

## 2011-01-23 DIAGNOSIS — Z01812 Encounter for preprocedural laboratory examination: Secondary | ICD-10-CM | POA: Insufficient documentation

## 2011-01-23 LAB — GLUCOSE, CAPILLARY
Glucose-Capillary: 122 mg/dL — ABNORMAL HIGH (ref 70–99)
Glucose-Capillary: 109 mg/dL — ABNORMAL HIGH (ref 70–99)

## 2011-01-23 SURGERY — DILATATION & CURETTAGE/HYSTEROSCOPY WITH RESECTOCOPE
Anesthesia: Choice | Wound class: Clean Contaminated

## 2011-01-23 MED ORDER — EPHEDRINE SULFATE 50 MG/ML IJ SOLN
INTRAMUSCULAR | Status: DC | PRN
  Administered 2011-01-23 (×2): 10 mg via INTRAVENOUS

## 2011-01-23 MED ORDER — METRONIDAZOLE IN NACL 5-0.79 MG/ML-% IV SOLN
INTRAVENOUS | Status: DC | PRN
  Administered 2011-01-23: 500 mg via INTRAVENOUS

## 2011-01-23 MED ORDER — PROPOFOL 10 MG/ML IV EMUL
INTRAVENOUS | Status: DC | PRN
  Administered 2011-01-23: 200 mg via INTRAVENOUS

## 2011-01-23 MED ORDER — LACTATED RINGERS IV SOLN
INTRAVENOUS | Status: DC
  Administered 2011-01-23 (×5): via INTRAVENOUS

## 2011-01-23 MED ORDER — FENTANYL CITRATE 0.05 MG/ML IJ SOLN: 25.0000 ug | INTRAMUSCULAR | Status: DC | PRN

## 2011-01-23 MED ORDER — MIDAZOLAM HCL 5 MG/5ML IJ SOLN
INTRAMUSCULAR | Status: DC | PRN
  Administered 2011-01-23 (×2): 1 mg via INTRAVENOUS

## 2011-01-23 MED ORDER — LIDOCAINE HCL (CARDIAC) 20 MG/ML IV SOLN
INTRAVENOUS | Status: AC
  Filled 2011-01-23: qty 5

## 2011-01-23 MED ORDER — FENTANYL CITRATE 0.05 MG/ML IJ SOLN
INTRAMUSCULAR | Status: DC | PRN
  Administered 2011-01-23: 50 ug via INTRAVENOUS
  Administered 2011-01-23: 25 ug via INTRAVENOUS
  Administered 2011-01-23: 50 ug via INTRAVENOUS
  Administered 2011-01-23: 25 ug via INTRAVENOUS

## 2011-01-23 MED ORDER — EPHEDRINE SULFATE 50 MG/ML IJ SOLN
INTRAMUSCULAR | Status: AC
  Filled 2011-01-23: qty 1

## 2011-01-23 MED ORDER — LIDOCAINE HCL (CARDIAC) 20 MG/ML IV SOLN
INTRAVENOUS | Status: DC | PRN
  Administered 2011-01-23: 100 mg via INTRAVENOUS

## 2011-01-23 MED ORDER — FENTANYL CITRATE 0.05 MG/ML IJ SOLN
INTRAMUSCULAR | Status: AC
  Filled 2011-01-23: qty 5

## 2011-01-23 MED ORDER — GLYCINE 1.5 % IR SOLN
Status: DC | PRN
  Administered 2011-01-23: 3000 mL

## 2011-01-23 MED ORDER — MIDAZOLAM HCL 2 MG/2ML IJ SOLN
INTRAMUSCULAR | Status: AC
  Filled 2011-01-23: qty 2

## 2011-01-23 MED ORDER — ONDANSETRON HCL 4 MG/2ML IJ SOLN
INTRAMUSCULAR | Status: AC
  Filled 2011-01-23: qty 2

## 2011-01-23 MED ORDER — HYDROCODONE-ACETAMINOPHEN 5-500 MG PO TABS: 2.0000 | ORAL_TABLET | Freq: Four times a day (QID) | ORAL | Status: DC | PRN

## 2011-01-23 MED ORDER — LIDOCAINE HCL 1 % IJ SOLN
INTRAMUSCULAR | Status: DC | PRN
  Administered 2011-01-23: 10 mL

## 2011-01-23 MED ORDER — CIPROFLOXACIN HCL 500 MG PO TABS: 500.0000 mg | ORAL_TABLET | Freq: Two times a day (BID) | ORAL | Status: AC

## 2011-01-23 MED ORDER — PROPOFOL 10 MG/ML IV EMUL
INTRAVENOUS | Status: AC
  Filled 2011-01-23: qty 20

## 2011-01-23 SURGICAL SUPPLY — 16 items
CANISTER SUCTION 2500CC (MISCELLANEOUS) ×2 IMPLANT
CATH ROBINSON RED A/P 16FR (CATHETERS) IMPLANT
CLOTH BEACON ORANGE TIMEOUT ST (SAFETY) ×2 IMPLANT
CONTAINER PREFILL 10% NBF 60ML (FORM) ×2 IMPLANT
DILATOR CANAL MILEX (MISCELLANEOUS) ×2 IMPLANT
DRAPE UTILITY XL STRL (DRAPES) ×4 IMPLANT
ELECT REM PT RETURN 9FT ADLT (ELECTROSURGICAL)
ELECTRODE REM PT RTRN 9FT ADLT (ELECTROSURGICAL) IMPLANT
GLOVE BIOGEL PI IND STRL 7.0 (GLOVE) ×1 IMPLANT
GLOVE BIOGEL PI INDICATOR 7.0 (GLOVE) ×1
GLOVE ECLIPSE 6.5 STRL STRAW (GLOVE) ×4 IMPLANT
GOWN PREVENTION PLUS LG XLONG (DISPOSABLE) ×2 IMPLANT
LOOP ANGLED CUTTING 22FR (CUTTING LOOP) IMPLANT
PACK HYSTEROSCOPY LF (CUSTOM PROCEDURE TRAY) ×2 IMPLANT
TOWEL OR 17X24 6PK STRL BLUE (TOWEL DISPOSABLE) ×4 IMPLANT
WATER STERILE IRR 1000ML POUR (IV SOLUTION) ×2 IMPLANT

## 2011-01-23 NOTE — Progress Notes (Signed)
Pt sitting in recliner in PACU phase II.  O2 sat on RA 94-97%.  Incentive spirometry performed by patient(2000 X 10).  Encouraged patient to use CPAP while resting at home today and to practice IS every hour.  Patient stated understanding and agreed to plan of care.

## 2011-01-23 NOTE — Anesthesia Postprocedure Evaluation (Signed)
Anesthesia Post Note  Patient: Nicole Cabrera  Procedure(s) Performed:  DILATATION & CURETTAGE/HYSTEROSCOPY WITH RESECTOSCOPE - Hysteroscopy with Dilatation and curettage  Anesthesia type: General  Patient location: PACU  Post pain: Pain level controlled  Post assessment: Post-op Vital signs reviewed  Last Vitals:  Filed Vitals:   01/23/11 0830  BP: 140/73  Pulse: 111  Temp:   Resp: 20    Post vital signs: Reviewed  Level of consciousness: sedated  Complications: No apparent anesthesia complicationsfj

## 2011-01-23 NOTE — Anesthesia Preprocedure Evaluation (Signed)
Anesthesia Evaluation  Patient identified by MRN, date of birth, ID band Patient awake    Reviewed: Allergy & Precautions, H&P , NPO status , Patient's Chart, lab work & pertinent test results, reviewed documented beta blocker date and time   History of Anesthesia Complications Negative for: history of anesthetic complications  Airway Mallampati: II TM Distance: >3 FB Neck ROM: full    Dental  (+) Teeth Intact   Pulmonary shortness of breath, sleep apnea and Continuous Positive Airway Pressure Ventilation ,  clear to auscultation  Pulmonary exam normal       Cardiovascular hypertension, On Medications regular Normal    Neuro/Psych PSYCHIATRIC DISORDERS (anxiety)  Neuromuscular disease (peripheral neuropathy)    GI/Hepatic negative GI ROS, Neg liver ROS,   Endo/Other  Diabetes mellitus-, Type obesity  Renal/GU Kidney stones  Genitourinary negative   Musculoskeletal  (+) Arthritis -,   Abdominal   Peds  Hematology negative hematology ROS (+)   Anesthesia Other Findings   Reproductive/Obstetrics negative OB ROS                           Anesthesia Physical Anesthesia Plan  ASA: III  Anesthesia Plan: General   Post-op Pain Management:    Induction:   Airway Management Planned: LMA  Additional Equipment:   Intra-op Plan:   Post-operative Plan:   Informed Consent: I have reviewed the patients History and Physical, chart, labs and discussed the procedure including the risks, benefits and alternatives for the proposed anesthesia with the patient or authorized representative who has indicated his/her understanding and acceptance.   Dental Advisory Given  Plan Discussed with: CRNA and Surgeon  Anesthesia Plan Comments:         Anesthesia Quick Evaluation

## 2011-01-23 NOTE — Transfer of Care (Signed)
Immediate Anesthesia Transfer of Care Note  Patient: Nicole Cabrera  Procedure(s) Performed:  DILATATION & CURETTAGE/HYSTEROSCOPY WITH RESECTOSCOPE - Hysteroscopy with Dilatation and curettage  Patient Location: PACU  Anesthesia Type: General  Level of Consciousness: awake, alert , oriented and patient cooperative  Airway & Oxygen Therapy: Patient Spontanous Breathing and Patient connected to nasal cannula oxygen  Post-op Assessment: Report given to PACU RN and Post -op Vital signs reviewed and stable  Post vital signs: Reviewed and stable  Complications: No apparent anesthesia complications

## 2011-01-23 NOTE — H&P (Signed)
Nicole Cabrera is an 65 y.o. female with postmenopausal bleeding.  Endometrial biopsy performed in office in 7/12 showing proliferative endometrium.  Patient has done Provera withdrawal with heavy bleeding and clotting.  Sonohysterogram performed 10/31 showed 9mm endometrium and possible polyp.  Hysteroscopy, polyp resection, D&C discussed with patient and spouse.  Risks and benefits all explained.  All of this is documented in her office chart.  Patient ready to proceed.    Pertinent Gynecological History: Menses: post-menopausal Bleeding: heavy bleeding with clots post Provera withdrawal Contraception: none DES exposure: denies Blood transfusions: none Sexually transmitted diseases: no past history Previous GYN Procedures: none  Last mammogram: normal Date: Oct 2012  Last pap: normal Date: Oct 2012 OB History: G5, P3   Menstrual History: Menarche age: unsure No LMP recorded. Patient is postmenopausal.    Past Medical History  Diagnosis Date  . Sleep apnea     CPAP  . Hypertension   . Obesity   . Bronchitis   . Hemorrhoids   . Chronic kidney disease     kidney stones  . Anxiety     xanax  . Shortness of breath     with exertion  . Diabetes mellitus     borderline - no meds  . Neuromuscular disorder     peripheral neuropathy-feet  . Peripheral neuropathy   . Arthritis     shoulders/hips - tx with OTC meds    Past Surgical History  Procedure Date  . Total knee arthroplasty     right  . Total knee arthroplasty     left -   . Cesarean section   . Wrist surgery     left  . Cervical disc surgery   . Dilation and curettage of uterus     , polyp removal  . Upper gastrointestinal endoscopy   . Colonoscopy     2007  . Tubal ligation     History reviewed. No pertinent family history.  Social History:  reports that she has never smoked. She has never used smokeless tobacco. She reports that she drinks alcohol. She reports that she does not use illicit  drugs.  Allergies:  Allergies  Allergen Reactions  . Vistaril (Hydroxyzine Hcl) Other (See Comments)    Pt becomes physchotic.  Marland Kitchen Erythromycin Rash  . Latex Rash  . Penicillins Rash  . Tetracycline Rash  . Vistide (Cidofovir) Rash    Prescriptions prior to admission  Medication Sig Dispense Refill  . acetaminophen (TYLENOL) 500 MG tablet Take 500 mg by mouth every 6 (six) hours as needed. For cramps.       Marland Kitchen acetic acid-hydrocortisone (VOSOL-HC) otic solution Place 3 drops into both ears 2 (two) times daily. Pt was using this medication for ten days. Stopped medication on or about 01-15-11.       Marland Kitchen losartan-hydrochlorothiazide (HYZAAR) 100-25 MG per tablet Take 1 tablet by mouth daily.        . medroxyPROGESTERone (PROVERA) 10 MG tablet Take 10 mg by mouth daily. Pt took medication for ten days, ending on or about 01-10-11.       . Multiple Vitamins-Minerals (MULTIVITAMIN WITH MINERALS) tablet Take 1 tablet by mouth daily.          Review of Systems  Constitutional: Negative for fever and chills.  Respiratory: Negative for cough.   Cardiovascular: Negative for chest pain.  Gastrointestinal: Negative for heartburn, nausea and vomiting.  Genitourinary: Negative for dysuria.  Skin: Negative for rash.  Neurological: Negative for dizziness  and headaches.  Endo/Heme/Allergies: Does not bruise/bleed easily.  Psychiatric/Behavioral: Negative for depression.    Blood pressure 144/75, pulse 74, temperature 98.1 F (36.7 C), temperature source Oral, resp. rate 20, height 4\' 11"  (1.499 m), weight 113.399 kg (250 lb). Physical Exam  Vitals reviewed. Constitutional: She is oriented to person, place, and time. She appears well-developed and well-nourished.  HENT:  Head: Normocephalic and atraumatic.  Eyes: Pupils are equal, round, and reactive to light.  Neck: Normal range of motion.  Cardiovascular: Normal rate and regular rhythm.   Respiratory: Effort normal and breath sounds normal.   GI: Soft. Bowel sounds are normal.  Genitourinary: Uterus normal.  Musculoskeletal: Normal range of motion.  Neurological: She is alert and oriented to person, place, and time.  Skin: Skin is warm.  Psychiatric: She has a normal mood and affect.    Results for orders placed during the hospital encounter of 01/23/11 (from the past 24 hour(s))  GLUCOSE, CAPILLARY     Status: Abnormal   Collection Time   01/23/11  6:28 AM      Component Value Range   Glucose-Capillary 122 (*) 70 - 99 (mg/dL)    No results found.  Assessment/Plan: Postmenopausal bleeding.  Thickened endometrium noted on Sonohysterogram in the office.  Hysteroscopy, possible polyp resection, D&C planned.  Yamilka Lopiccolo,M SUZANNE 01/23/2011, 7:04 AM

## 2011-01-23 NOTE — Preoperative (Signed)
Beta Blockers   Reason not to administer Beta Blockers:Not Applicable 

## 2011-01-23 NOTE — Op Note (Addendum)
01/23/2011  8:22 AM  PATIENT:  Nicole Cabrera  65 y.o. female G52P3 with postmenopausal bleeding, possible endometrial polyp, thickened endometrium on ultrasound, morbid obesity, OSA, htn, history of bilateral knee placements  PRE-OPERATIVE DIAGNOSIS:  pmb  POST-OPERATIVE DIAGNOSIS:  post menopausal bleeding  PROCEDURE:  Procedure(s): DILATATION & CURETTAGE/HYSTEROSCOPY   SURGEON:  Rakel Junio,M SUZANNE  ASSISTANTS: OR staff   ANESTHESIA:   LMA--Dr. Hatchett oversaw the case  ESTIMATED BLOOD LOSS: * No blood loss amount entered *  BLOOD ADMINISTERED:none   FLUIDS: 1000cc LR  UOP: 75cc drained at beginning of procedure with I&O cath  SPECIMEN:  Endometrial curettings   DISPOSITION OF SPECIMEN:  PATHOLOGY  FINDINGS: blood within cavity.  Polyp noted  DESCRIPTION OF OPERATION: Patient was taken to the operating room. She was placed initially in the supine position. Arms were supported with armboard bilaterally. Running IV was present.  Patient's legs are positioned in the low lithotomy position. She was positioned awake because she has had a history of bilateral knee replacements. She is very comfortable was positioning and even with lifting legs to the high lithotomy position. Then anesthesia was administered by the anesthesia staff without difficulty.  Time out was performed.  Perineum, inner thighs, and vagina were prepped with Betadine prep. Patient was draped in a normal sterile fashion. A Foley catheter is used to drain the bladder of all urine. Urine was very cloudy looking at time of I&O.  I will plan to treat her post-operatively with ciprofloxin for one week.  A long bivalve spectrum speculum was then used to visualize cervix.  The patient has a very long vagina due to her obesity.  The surgical assisted needed to push on the speculum to hold it in place in order for me to be able to visualize the cervix.  The anterior lip of the cervix was grasped with single-tooth tenaculum. A  paracervical block of 1% plain Xylocaine was used. 10 cc total was used. A Milex dilator was initially used to dilate cervix. The cervix is dilated with Pratt dilators up to #21. The uterus sounded to approximate 8 1/2 cm.  The diagnostic hysteroscope was used. This is a 2.9 mm scope. The endocervical cavity was passed and endometrial cavity was seen. There is just a lot of blood and web-like clots making endometrial cavity difficult to fully visualized.  As well, with her obesity, I was pushing the scope on her perineum without being at the fundus of the cavity.  Again, she had a quite long vagina a due to her morbid obesity. The hysteroscope was then removed and a #1 toothed and then smooth curette were used to curette endometrial cavity. A rough gritty texture was noted in all quadrants. Hysteroscope was then used to revisualize cavity. The small polyp posteriorly that was still present. A curette was performed again.  At this point the procedure was ended. Hysteroscope was removed. The tenaculum was removed from the cervix. There is a small amount of bleeding of the cervix was made hemostatic with silver nitrate. The endometrial curettings were all sent together to pathology.  Hysteroscopic fluid was 1.5% Glycine.  Deficit was 140cc, some on the drapes and floor.  The Betadine prep was cleansed from the skin. Sponge, lap, and needle, and instrument counts were correct x2. Patient was later anesthesia and taken to recovery in stable condition.  COUNTS:  YES  PLAN OF CARE: Transfer to PACU

## 2011-02-27 ENCOUNTER — Encounter: Payer: Self-pay | Admitting: Internal Medicine

## 2011-02-27 ENCOUNTER — Ambulatory Visit (INDEPENDENT_AMBULATORY_CARE_PROVIDER_SITE_OTHER): Payer: 59 | Admitting: Internal Medicine

## 2011-02-27 VITALS — BP 130/74 | HR 90 | Ht 59.5 in | Wt 260.8 lb

## 2011-02-27 DIAGNOSIS — G4730 Sleep apnea, unspecified: Secondary | ICD-10-CM

## 2011-02-27 NOTE — Patient Instructions (Signed)
Continue CPAP 11  Please call as needed

## 2011-02-27 NOTE — Progress Notes (Signed)
02/27/11- 65 yoF never smoker, followed for obstructive sleep apnea complicated by HBP, history of bronchitis. LOV- 02/28/2010. At last visit we had recommended change from lisinopril because of cough. She was changed to a ARB and her cough resolved. She declines flu vaccine. She reports that she is quite comfortable with CPAP at 11 CWP/Advanced, with good compliance and control. She is using CPAP all night every night and she denies daytime sleepiness.  ROS-see HPI Constitutional:   No-   weight loss, night sweats, fevers, chills, fatigue, lassitude. HEENT:   No-  headaches, difficulty swallowing, tooth/dental problems, sore throat,       No-  sneezing, itching, ear ache, nasal congestion, post nasal drip,  CV:  No-   chest pain, orthopnea, PND, swelling in lower extremities, anasarca,                                  dizziness, palpitations Resp: No-   shortness of breath with exertion or at rest.              No-   productive cough,  No non-productive cough,  No- coughing up of blood.              No-   change in color of mucus.  No- wheezing.   Skin:  GI:   GU:  MS:  . Neuro-     nothing unusual Psych:  No- change in mood or affect. No depression or anxiety.  No memory loss.  OBJ General- Alert, Oriented, Affect-appropriate, Distress- none acute; quite obese Skin- rash-none, lesions- none, excoriation- none Lymphadenopathy- none Head- atraumatic            Eyes- Gross vision intact, PERRLA, conjunctivae clear secretions            Ears- Hearing, canals-normal            Nose- Clear, no-Septal dev, mucus, polyps, erosion, perforation             Throat- Mallampati III , mucosa clear , drainage- none, tonsils- atrophic Neck- flexible , trachea midline, no stridor , thyroid nl, carotid no bruit Chest - symmetrical excursion , unlabored           Heart/CV- RRR , no murmur , no gallop  , no rub, nl s1 s2                           - JVD- none , edema- none, stasis changes- none, varices-  none           Lung- clear to P&A, wheeze- none, cough- none , dullness-none, rub- none           Chest wall-  Abd- tender-no, distended-no, bowel sounds-present, HSM- no Br/ Gen/ Rectal- Not done, not indicated Extrem- cyanosis- none, clubbing, none, atrophy- none, strength- nl Neuro- grossly intact to observation

## 2011-03-01 NOTE — Assessment & Plan Note (Signed)
Good compliance and control. Weight loss would help. No intervention needed.

## 2011-03-20 ENCOUNTER — Encounter (HOSPITAL_COMMUNITY): Payer: Self-pay | Admitting: *Deleted

## 2011-03-20 ENCOUNTER — Emergency Department (HOSPITAL_COMMUNITY)
Admission: EM | Admit: 2011-03-20 | Discharge: 2011-03-21 | Disposition: A | Discharge status: Home / Self Care | Payer: 59 | Attending: Emergency Medicine | Admitting: Emergency Medicine

## 2011-03-20 DIAGNOSIS — R600 Localized edema: Secondary | ICD-10-CM

## 2011-03-20 DIAGNOSIS — E119 Type 2 diabetes mellitus without complications: Secondary | ICD-10-CM | POA: Insufficient documentation

## 2011-03-20 DIAGNOSIS — F411 Generalized anxiety disorder: Secondary | ICD-10-CM | POA: Insufficient documentation

## 2011-03-20 DIAGNOSIS — N189 Chronic kidney disease, unspecified: Secondary | ICD-10-CM | POA: Insufficient documentation

## 2011-03-20 DIAGNOSIS — Z79899 Other long term (current) drug therapy: Secondary | ICD-10-CM | POA: Insufficient documentation

## 2011-03-20 DIAGNOSIS — M7989 Other specified soft tissue disorders: Secondary | ICD-10-CM | POA: Insufficient documentation

## 2011-03-20 DIAGNOSIS — R609 Edema, unspecified: Secondary | ICD-10-CM | POA: Insufficient documentation

## 2011-03-20 DIAGNOSIS — M129 Arthropathy, unspecified: Secondary | ICD-10-CM | POA: Insufficient documentation

## 2011-03-20 DIAGNOSIS — I129 Hypertensive chronic kidney disease with stage 1 through stage 4 chronic kidney disease, or unspecified chronic kidney disease: Secondary | ICD-10-CM | POA: Insufficient documentation

## 2011-03-20 NOTE — ED Notes (Signed)
Pt in c/o left leg swelling since yesterday, states she has also felt like her sciatic nerve has been pinched this week

## 2011-03-21 ENCOUNTER — Ambulatory Visit (HOSPITAL_COMMUNITY)
Admit: 2011-03-21 | Discharge: 2011-03-21 | Disposition: A | Discharge status: Home / Self Care | Payer: 59 | Source: Ambulatory Visit | Attending: Emergency Medicine | Admitting: Emergency Medicine

## 2011-03-21 DIAGNOSIS — M7989 Other specified soft tissue disorders: Secondary | ICD-10-CM | POA: Insufficient documentation

## 2011-03-21 MED ORDER — ENOXAPARIN SODIUM 120 MG/0.8ML ~~LOC~~ SOLN
1.0000 mg/kg | Freq: Once | SUBCUTANEOUS | Status: AC
  Administered 2011-03-21: 115 mg via SUBCUTANEOUS
  Filled 2011-03-21: qty 0.8

## 2011-03-21 NOTE — ED Provider Notes (Signed)
History     CSN: [id number]  Arrival date & time 03/20/11  1933   First MD Initiated Contact with Patient 03/21/11 0005      Chief Complaint  Patient presents with  . Leg Swelling    (Consider location/radiation/quality/duration/timing/severity/associated sxs/prior treatment) The history is provided by the patient.   patient reports left lower extremity swelling that is worsening since yesterday.  She denies chest pain shortness of breath.  She has no prior history of DVT.  She did have a recent D&C in November 2012 for abnormal vaginal bleeding.  She is not on birth control pills.  She does not smoke cigarettes.  She's had no recent long travel.  She denies weakness or numbness of her left lower extremity.  She has no other complaints.  Nothing worsens her symptoms.  Nothing improves her symptoms.  Her symptoms are constant.  Past Medical History  Diagnosis Date  . Sleep apnea     CPAP  . Hypertension   . Obesity   . Bronchitis   . Hemorrhoids   . Chronic kidney disease     kidney stones  . Anxiety     xanax  . Shortness of breath     with exertion  . Diabetes mellitus     borderline - no meds  . Neuromuscular disorder     peripheral neuropathy-feet  . Peripheral neuropathy   . Arthritis     shoulders/hips - tx with OTC meds    Past Surgical History  Procedure Date  . Total knee arthroplasty     right  . Total knee arthroplasty     left -   . Cesarean section   . Wrist surgery     left  . Cervical disc surgery   . Dilation and curettage of uterus     , polyp removal  . Upper gastrointestinal endoscopy   . Colonoscopy     2007  . Tubal ligation     History reviewed. No pertinent family history.  History  Substance Use Topics  . Smoking status: Never Smoker   . Smokeless tobacco: Never Used  . Alcohol Use: Yes     socially - wine    OB History    Grav Para Term Preterm Abortions TAB SAB Ect Mult Living                  Review of Systems  All  other systems reviewed and are negative.    Allergies  Vistaril; Erythromycin; Latex; Penicillins; and Tetracycline  Home Medications   Current Outpatient Rx  Name Route Sig Dispense Refill  . ACETAMINOPHEN 500 MG PO TABS Oral Take 500 mg by mouth every 6 (six) hours as needed. For cramps.     . ASPIRIN EC 81 MG PO TBEC Oral Take 81 mg by mouth daily.      Marland Kitchen LOSARTAN POTASSIUM-HCTZ 100-25 MG PO TABS Oral Take 1 tablet by mouth daily.      . MULTI-VITAMIN/MINERALS PO TABS Oral Take 1 tablet by mouth daily.        BP 186/89  Pulse 93  Temp 98 F (36.7 C)  Resp 20  Wt 250 lb (113.399 kg)  SpO2 100%  Physical Exam  Nursing note and vitals reviewed. Constitutional: She is oriented to person, place, and time. She appears well-developed and well-nourished. No distress.  HENT:  Head: Normocephalic and atraumatic.  Eyes: EOM are normal.  Neck: Normal range of motion.  Cardiovascular: Normal rate, regular  rhythm and normal heart sounds.   Pulmonary/Chest: Effort normal and breath sounds normal.  Abdominal: Soft. She exhibits no distension. There is no tenderness.  Musculoskeletal: Normal range of motion.       The patient's left lower extremity is mildly swollen diffusely as compared to the right and does appear to be enlarged.  She has a normal left DP and PT pulses her foot.  She has no erythema warmth or focal tenderness.  Neurological: She is alert and oriented to person, place, and time.  Skin: Skin is warm and dry.  Psychiatric: She has a normal mood and affect. Judgment normal.    ED Course  Procedures (including critical care time)  Labs Reviewed - No data to display No results found.   1. Edema of lower extremity       MDM  The patient will require an ultrasound of her left lower extremity.  We do not have the capabilities here tonight and thus the patient has been given a 1 mg per kilogram dose of Lovenox and she will followup with the [facility]  emergency department from ultrasound of her left lower extremity tomorrow.  This we completed any pain secondary to ease of transportation from the patient's home and being.  I've called the radiology department at any tenderness has been scheduled for 10am.  The patient understands to call her primary care Dr. for close followup if her ultrasound is negative for a deep vein thrombosis and she is instructed to elevate her left leg assessment represent venous insufficiency.        Lyanne Co, MD 03/21/11 [phone]

## 2012-02-04 ENCOUNTER — Other Ambulatory Visit: Payer: Self-pay | Admitting: Obstetrics & Gynecology

## 2012-02-04 DIAGNOSIS — Z1231 Encounter for screening mammogram for malignant neoplasm of breast: Secondary | ICD-10-CM

## 2012-02-26 ENCOUNTER — Encounter: Payer: Self-pay | Admitting: Internal Medicine

## 2012-02-26 ENCOUNTER — Ambulatory Visit (INDEPENDENT_AMBULATORY_CARE_PROVIDER_SITE_OTHER): Payer: 59 | Admitting: Internal Medicine

## 2012-02-26 VITALS — BP 124/80 | HR 78 | Ht 59.5 in | Wt 252.2 lb

## 2012-02-26 DIAGNOSIS — G4733 Obstructive sleep apnea (adult) (pediatric): Secondary | ICD-10-CM

## 2012-02-26 NOTE — Patient Instructions (Addendum)
You are doing very well with CPAP. We can continue CPAP 11/ Advanced   Please call as needed

## 2012-02-26 NOTE — Progress Notes (Signed)
02/27/11- 65 yoF never smoker, followed for obstructive sleep apnea complicated by HBP, history of bronchitis. LOV- 02/28/2010. At last visit we had recommended change from lisinopril because of cough. She was changed to a ARB and her cough resolved. She declines flu vaccine. She reports that she is quite comfortable with CPAP at 11 CWP/Advanced, with good compliance and control. She is using CPAP all night every night and she denies daytime sleepiness.  02/26/12- 65 yoF never smoker, followed for obstructive sleep apnea complicated by HBP, history of bronchitis. FOLLOWS FOR: wears CPAP every night for about 10 hours; new supplies on 03-05-12; pressure working well for patient.  She declines flu vaccine Continues CPAP 11/Advanced all night every night using a nasal mask. She does not need and feels well controlled.  ROS-see HPI Constitutional:   No-   weight loss, night sweats, fevers, chills, fatigue, lassitude. HEENT:   No-  headaches, difficulty swallowing, tooth/dental problems, sore throat,       No-  sneezing, itching, ear ache, nasal congestion, post nasal drip,  CV:  No-   chest pain, orthopnea, PND, swelling in lower extremities, anasarca, dizziness, palpitations Resp: No-   shortness of breath with exertion or at rest.              No-   productive cough,  No non-productive cough,  No- coughing up of blood.              No-   change in color of mucus.  No- wheezing.   Skin: No-   rash or lesions. GI:  No-   heartburn, indigestion, abdominal pain, nausea, vomiting,  GU: . MS:  No-   joint pain or swelling.  Neuro-     nothing unusual Psych:  No- change in mood or affect. No depression or anxiety.  No memory loss.   OBJ BP 124/80  Pulse 78  Ht 4' 11.5" (1.511 m)  Wt 252 lb 3.2 oz (114.397 kg)  BMI 50.09 kg/m2  SpO2 91% General- Alert, Oriented, Affect-appropriate, Distress- none acute; quite obese Skin- rash-none, lesions- none, excoriation- none Lymphadenopathy- none Head-  atraumatic            Eyes- Gross vision intact, PERRLA, conjunctivae clear secretions            Ears- Hearing, canals-normal            Nose- Clear, no-Septal dev, mucus, polyps, erosion, perforation             Throat- Mallampati III , mucosa clear , drainage- none, tonsils- atrophic Neck- flexible , trachea midline, no stridor , thyroid nl, carotid no bruit Chest - symmetrical excursion , unlabored           Heart/CV- RRR , no murmur , no gallop  , no rub, nl s1 s2                           - JVD- none , edema- none, stasis changes- none, varices- none           Lung- clear to P&A, wheeze- none, cough- none , dullness-none, rub- none           Chest wall-  Abd-  Br/ Gen/ Rectal- Not done, not indicated Extrem- cyanosis- none, clubbing, none, atrophy- none, strength- nl, cane Neuro- grossly intact to observation

## 2012-03-04 ENCOUNTER — Ambulatory Visit (HOSPITAL_COMMUNITY)
Admission: RE | Admit: 2012-03-04 | Discharge: 2012-03-04 | Disposition: A | Discharge status: Home / Self Care | Payer: 59 | Source: Ambulatory Visit | Attending: Obstetrics & Gynecology | Admitting: Obstetrics & Gynecology

## 2012-03-04 DIAGNOSIS — Z1231 Encounter for screening mammogram for malignant neoplasm of breast: Secondary | ICD-10-CM | POA: Insufficient documentation

## 2012-03-04 LAB — HM MAMMOGRAPHY: HM Mammogram: NORMAL

## 2012-03-06 NOTE — Assessment & Plan Note (Signed)
Good compliance and control. Her machine works well. Weight loss would make the biggest difference but she has not yet been able to accomplish this.

## 2012-11-08 ENCOUNTER — Encounter: Payer: Self-pay | Admitting: Internal Medicine

## 2012-12-14 ENCOUNTER — Emergency Department (HOSPITAL_COMMUNITY): Payer: 59

## 2012-12-14 ENCOUNTER — Emergency Department (HOSPITAL_COMMUNITY)
Admission: EM | Admit: 2012-12-14 | Discharge: 2012-12-14 | Disposition: A | Discharge status: Home / Self Care | Payer: 59 | Attending: Emergency Medicine | Admitting: Emergency Medicine

## 2012-12-14 ENCOUNTER — Encounter (HOSPITAL_COMMUNITY): Payer: Self-pay | Admitting: Emergency Medicine

## 2012-12-14 DIAGNOSIS — I129 Hypertensive chronic kidney disease with stage 1 through stage 4 chronic kidney disease, or unspecified chronic kidney disease: Secondary | ICD-10-CM | POA: Insufficient documentation

## 2012-12-14 DIAGNOSIS — Z8709 Personal history of other diseases of the respiratory system: Secondary | ICD-10-CM | POA: Insufficient documentation

## 2012-12-14 DIAGNOSIS — Z880 Allergy status to penicillin: Secondary | ICD-10-CM | POA: Insufficient documentation

## 2012-12-14 DIAGNOSIS — G4730 Sleep apnea, unspecified: Secondary | ICD-10-CM | POA: Insufficient documentation

## 2012-12-14 DIAGNOSIS — R519 Headache, unspecified: Secondary | ICD-10-CM

## 2012-12-14 DIAGNOSIS — F411 Generalized anxiety disorder: Secondary | ICD-10-CM | POA: Insufficient documentation

## 2012-12-14 DIAGNOSIS — R51 Headache: Secondary | ICD-10-CM | POA: Insufficient documentation

## 2012-12-14 DIAGNOSIS — Z91040 Latex allergy status: Secondary | ICD-10-CM | POA: Insufficient documentation

## 2012-12-14 DIAGNOSIS — I10 Essential (primary) hypertension: Secondary | ICD-10-CM

## 2012-12-14 DIAGNOSIS — E669 Obesity, unspecified: Secondary | ICD-10-CM | POA: Insufficient documentation

## 2012-12-14 DIAGNOSIS — Z7982 Long term (current) use of aspirin: Secondary | ICD-10-CM | POA: Insufficient documentation

## 2012-12-14 DIAGNOSIS — Z87442 Personal history of urinary calculi: Secondary | ICD-10-CM | POA: Insufficient documentation

## 2012-12-14 DIAGNOSIS — N189 Chronic kidney disease, unspecified: Secondary | ICD-10-CM | POA: Insufficient documentation

## 2012-12-14 DIAGNOSIS — Z79899 Other long term (current) drug therapy: Secondary | ICD-10-CM | POA: Insufficient documentation

## 2012-12-14 DIAGNOSIS — M19019 Primary osteoarthritis, unspecified shoulder: Secondary | ICD-10-CM | POA: Insufficient documentation

## 2012-12-14 LAB — CBC WITH DIFFERENTIAL/PLATELET
Basophils Absolute: 0.1 10*3/uL (ref 0.0–0.1)
Basophils Relative: 1 % (ref 0–1)
Eosinophils Absolute: 0.3 10*3/uL (ref 0.0–0.7)
Eosinophils Relative: 3 % (ref 0–5)
HCT: 43.8 % (ref 36.0–46.0)
Hemoglobin: 14.3 g/dL (ref 12.0–15.0)
Lymphocytes Relative: 24 % (ref 12–46)
Lymphs Abs: 2.5 10*3/uL (ref 0.7–4.0)
MCH: 28.1 pg (ref 26.0–34.0)
MCHC: 32.6 g/dL (ref 30.0–36.0)
MCV: 86.1 fL (ref 78.0–100.0)
Monocytes Absolute: 0.8 10*3/uL (ref 0.1–1.0)
Monocytes Relative: 8 % (ref 3–12)
Neutro Abs: 6.7 10*3/uL (ref 1.7–7.7)
Neutrophils Relative %: 65 % (ref 43–77)
Platelets: 265 10*3/uL (ref 150–400)
RBC: 5.09 MIL/uL (ref 3.87–5.11)
RDW: 14.5 % (ref 11.5–15.5)
WBC: 10.3 10*3/uL (ref 4.0–10.5)

## 2012-12-14 LAB — BASIC METABOLIC PANEL
BUN: 15 mg/dL (ref 6–23)
CO2: 29 mEq/L (ref 19–32)
Calcium: 9 mg/dL (ref 8.4–10.5)
Chloride: 102 mEq/L (ref 96–112)
Creatinine, Ser: 0.65 mg/dL (ref 0.50–1.10)
GFR calc Af Amer: >90 mL/min (ref >90)
GFR calc non Af Amer: 89 mL/min — ABNORMAL LOW (ref >90)
Glucose, Bld: 139 mg/dL — ABNORMAL HIGH (ref 70–99)
Potassium: 3.3 mEq/L — ABNORMAL LOW (ref 3.5–5.1)
Sodium: 138 mEq/L (ref 135–145)

## 2012-12-14 MED ORDER — ASPIRIN 81 MG PO CHEW: 81.0000 mg | CHEWABLE_TABLET | Freq: Every day | ORAL | Status: DC

## 2012-12-14 MED ORDER — KETOROLAC TROMETHAMINE 30 MG/ML IJ SOLN
30.0000 mg | Freq: Once | INTRAMUSCULAR | Status: AC
  Administered 2012-12-14: 30 mg via INTRAVENOUS
  Filled 2012-12-14: qty 1

## 2012-12-14 MED ORDER — MORPHINE SULFATE 4 MG/ML IJ SOLN
4.0000 mg | Freq: Once | INTRAMUSCULAR | Status: AC
  Administered 2012-12-14: 4 mg via INTRAVENOUS
  Filled 2012-12-14: qty 1

## 2012-12-14 NOTE — ED Notes (Signed)
A&O; skin w/d.  Respirations even and unlabored; able to speak in complete sentences without difficulty.  Patient has strong and equal hand grips bilaterally.  Patient has strong and equal pedal push/pull bilaterally.  Face symmetrical.  Patient follows commands.  Patient states she doesn't have headaches often, but when she does, she comes to ER for pain control.

## 2012-12-14 NOTE — ED Notes (Signed)
Patient c/o headache since yesterday.  Patient's husband states patient's BP has been high and that yesterday at lunch she had some difficulty speaking.

## 2012-12-14 NOTE — ED Notes (Signed)
Stroke swallow screen completed and passed.  Patient remains awake, A&O; skin w/d. Respirations even and unlabored; able to speak in complete sentences without difficulty.

## 2012-12-14 NOTE — ED Provider Notes (Signed)
CSN: [id number]     Arrival date & time 12/14/12  0401 History   First MD Initiated Contact with Patient 12/14/12 0404     Chief Complaint  Patient presents with  . Headache  . Hypertension    HPI Patient reports left-sided headache since yesterday.  No significant history of headaches before in the past.  She knows that her blood pressure was elevated to 160 systolic at home and this concerned her.  She is currently following up with her ear nose and throat is she's had vertiginous symptoms over the past week.  She describes her vertiginous symptoms of the room spinning around.  She has no prior history of stroke.  She does have a history of hypertension.  She denies a history of diabetes and states this is inappropriately placed on her medical record.  She denies weakness of her on with her legs.  Family reports word finding difficulties usually one isolated words such as "[employer]" or "decaffeinated".  These are transient episodes.  No difficulty in her speech now.  She reports seeing "spots in both of her eyes" earlier today that now has resolved.   Past Medical History  Diagnosis Date  . Sleep apnea     CPAP  . Hypertension   . Obesity   . Bronchitis   . Hemorrhoids   . Chronic kidney disease     kidney stones  . Anxiety     xanax  . Shortness of breath     with exertion  . Diabetes mellitus     borderline - no meds  . Neuromuscular disorder     peripheral neuropathy-feet  . Peripheral neuropathy   . Arthritis     shoulders/hips - tx with OTC meds   Past Surgical History  Procedure Laterality Date  . Total knee arthroplasty      right  . Total knee arthroplasty      left -   . Cesarean section    . Wrist surgery      left  . Cervical disc surgery    . Dilation and curettage of uterus      , polyp removal  . Upper gastrointestinal endoscopy    . Colonoscopy      2007  . Tubal ligation     No family history on file. History  Substance Use Topics  . Smoking  status: Never Smoker   . Smokeless tobacco: Never Used  . Alcohol Use: Yes     Comment: socially - wine   OB History   Grav Para Term Preterm Abortions TAB SAB Ect Mult Living                 Review of Systems  All other systems reviewed and are negative.    Allergies  Vistaril; Erythromycin; Latex; Penicillins; and Tetracycline  Home Medications   Current Outpatient Rx  Name  Route  Sig  Dispense  Refill  . acetaminophen (TYLENOL) 500 MG tablet   Oral   Take 500 mg by mouth every 6 (six) hours as needed. For cramps.          Marland Kitchen aspirin EC 81 MG tablet   Oral   Take 81 mg by mouth daily.           Marland Kitchen losartan-hydrochlorothiazide (HYZAAR) 100-25 MG per tablet   Oral   Take 1 tablet by mouth daily.           . Multiple Vitamins-Minerals (MULTIVITAMIN WITH MINERALS) tablet  Oral   Take 1 tablet by mouth daily.           Marland Kitchen amLODipine (NORVASC) 2.5 MG tablet   Oral   Take 1 tablet by mouth daily.          BP 152/72  Pulse 83  Temp(Src) 98.3 F (36.8 C)  Resp 20  Ht 4' 11.5" (1.511 m)  Wt 248 lb (112.492 kg)  BMI 49.27 kg/m2  SpO2 95% Physical Exam  Nursing note and vitals reviewed. Constitutional: She is oriented to person, place, and time. She appears well-developed and well-nourished. No distress.  HENT:  Head: Normocephalic and atraumatic.  Eyes: EOM are normal. Pupils are equal, round, and reactive to light.  Neck: Normal range of motion.  Cardiovascular: Normal rate, regular rhythm and normal heart sounds.   Pulmonary/Chest: Effort normal and breath sounds normal.  Abdominal: Soft. She exhibits no distension. There is no tenderness.  Musculoskeletal: Normal range of motion.  Neurological: She is alert and oriented to person, place, and time.  5/5 strength in major muscle groups of  bilateral upper and lower extremities. Speech normal. No facial asymetry.  No significant abnormality with finger to nose.  Skin: Skin is warm and dry.   Psychiatric: She has a normal mood and affect. Judgment normal.    ED Course  Procedures (including critical care time)  ECG interpretation   Date: 12/14/2012  Rate: 75  Rhythm: normal sinus rhythm  QRS Axis: normal  Intervals: normal  ST/T Wave abnormalities: normal  Conduction Disutrbances: none  Narrative Interpretation:   Old EKG Reviewed: No significant changes noted    Labs Review Labs Reviewed  BASIC METABOLIC PANEL - Abnormal; Notable for the following:    Potassium 3.3 (*)    Glucose, Bld 139 (*)    GFR calc non Af Amer 89 (*)    All other components within normal limits  CBC WITH DIFFERENTIAL   Imaging Review No results found.  MDM   1. Headache   2. HYPERTENSION    6:58 AM Patient feels much better at this time.  Headache is improving.  She's had no word finding issues here in the emergency department.  Her neurologic exam is normal.  My suspicion that this is just headache however this could represent TIA her comfort and migraine.  The patient will be discharged home on 81 mg aspirin.  Vas that she followup with the neurologist.  I do not think she needs to be admitted the hospital for a significant TIA workup.  I think she is stable to be discharged home in the care of her husband.  She understands to return to the ER for new or worsening symptoms    Lyanne Co, MD 12/14/12 [id number]

## 2012-12-14 NOTE — ED Notes (Signed)
EKG was done at 4:11 am when patient presented to the ER. This order was put in afterwards. No repeat needed.

## 2013-02-24 ENCOUNTER — Encounter: Payer: Self-pay | Admitting: Neurology

## 2013-02-27 ENCOUNTER — Ambulatory Visit (INDEPENDENT_AMBULATORY_CARE_PROVIDER_SITE_OTHER): Payer: 59 | Admitting: Neurology

## 2013-02-27 ENCOUNTER — Encounter (INDEPENDENT_AMBULATORY_CARE_PROVIDER_SITE_OTHER): Payer: Self-pay

## 2013-02-27 ENCOUNTER — Encounter: Payer: Self-pay | Admitting: Neurology

## 2013-02-27 VITALS — BP 168/84 | HR 75 | Ht 59.0 in | Wt 252.0 lb

## 2013-02-27 DIAGNOSIS — R4701 Aphasia: Secondary | ICD-10-CM | POA: Insufficient documentation

## 2013-02-27 NOTE — Progress Notes (Signed)
[facility]  PATIENT: Nicole Cabrera DOB: [birth year]  HISTORICAL  Nicole Cabrera is a 67 years old female, was referred by emergency room, and her primary care physician Dr. Jonni Sanger for evaluation of word finding difficulty  She had past medical history of obesity, hypertension, diabetes, obstructive sleep apnea, bilateral replacement baseline mild gait difficulty, sedentary lifestyle.  In September 20 fourth 2014, while having lunch with her husband, she had acute onset of word finding difficulties, lasting few minutes, there was no lateralized motor or sensory deficit, she went home afterwards, one hour later, she developed holoacranial pounding headache, which improved after Tylenol, the same night, she woke up at 3 AM, complaining of severe holoacranial headaches, more so at the left retro-orbital region, she was taken to the emergency room, her headache went away with morphine treatment, CAT scan of the brain was essentially normal, laboratory evaluation was normal including CMP, CBC, she denies a previous history of headaches,  She has baseline gait difficulty, because of her weight, knee pain,  She had similar episode about 3 years ago, while driving, she suddenly had word finding difficulties, could not think of the name of the town [place], lasting for 5 minutes, was taken to the emergency room as well, CAT scan of the brain was normal,   She is taking daily baby aspirin   REVIEW OF SYSTEMS: Full 14 system review of systems performed and notable only for  spinning sensation, cough, joint pain, allergy, memory loss, headaches, dizziness,  ALLERGIES: Allergies  Allergen Reactions  . Bactrim [Sulfamethoxazole-Tmp Ds]   . Lisinopril Cough  . Spironolactone Nausea Only  . Vistaril [Hydroxyzine Hcl] Other (See Comments)    Pt becomes physchotic.  Marland Kitchen Erythromycin Rash  . Latex Rash  . Penicillins Rash  . Tetracycline Rash    HOME MEDICATIONS: Outpatient  Prescriptions Prior to Visit  Medication Sig Dispense Refill  . acetaminophen (TYLENOL) 500 MG tablet Take 500 mg by mouth every 6 (six) hours as needed. For cramps.       Marland Kitchen aspirin 81 MG chewable tablet Chew 1 tablet (81 mg total) by mouth daily.  30 tablet  0  . aspirin EC 81 MG tablet Take 81 mg by mouth daily.        Marland Kitchen losartan-hydrochlorothiazide (HYZAAR) 100-25 MG per tablet Take 1 tablet by mouth daily.        . meclizine (ANTIVERT) 25 MG tablet Take by mouth as needed. 1/2-1 tab three times a day for vertigo      . Multiple Vitamins-Minerals (MULTIVITAMIN WITH MINERALS) tablet Take 1 tablet by mouth daily.        Marland Kitchen amLODipine (NORVASC) 2.5 MG tablet Take 1 tablet by mouth daily.       No facility-administered medications prior to visit.    PAST MEDICAL HISTORY: Past Medical History  Diagnosis Date  . Sleep apnea     CPAP  . Hypertension   . Obesity   . Bronchitis   . Hemorrhoids   . Chronic kidney disease     kidney stones  . Anxiety     xanax  . Shortness of breath     with exertion  . Diabetes mellitus     borderline - no meds  . Neuromuscular disorder     peripheral neuropathy-feet  . Peripheral neuropathy   . Arthritis     shoulders/hips - tx with OTC meds    PAST SURGICAL HISTORY: Past Surgical History  Procedure Laterality Date  .  Total knee replacement      right  . Total knee arthroplasty      left -   . Cesarean section    . Wrist surgery      left  . Cervical disc surgery    . Dilation and curettage of uterus      , polyp removal  . Upper gastrointestinal endoscopy    . Colonoscopy      2007  . Tubal ligation      FAMILY HISTORY: Family History  Problem Relation Age of Onset  . Heart disease Father   . Colon cancer Mother   . Heart disease Mother   . Breast cancer Maternal Aunt   . Colon cancer Maternal Aunt   . Colon cancer Maternal Uncle     SOCIAL HISTORY:  History   Social History  . Marital Status: Married    Spouse Name:  Casimiro Needle    Number of Children: 3  . 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 Smoking status: Never Smoker   . Smokeless tobacco: Never Used  . Alcohol Use: Yes     Comment: socially - wine  . Drug Use: No  . Sexual Activity: Yes    Birth Control/ Protection: Surgical   Other Topics Concern  . Not on file   Social History Narrative   Patient is married Casimiro Needle)   Patient has three children.   Patient is a homemaker.   Education- College   Right handed.   Caffeine- one coke cola            PHYSICAL EXAM   Filed Vitals:   02/27/13 1043  BP: 168/84  Pulse: 75  Height: 4\' 11"  (1.499 m)  Weight: 252 lb (114.306 kg)    Not recorded    Body mass index is 50.87 kg/(m^2).   Generalized: In no acute distress  Neck: Supple, no carotid bruits   Cardiac: Regular rate rhythm  Pulmonary: Clear to auscultation bilaterally  Musculoskeletal: No deformity  Neurological examination  Mentation: Alert oriented to time, place, history taking, and causual conversation  Cranial nerve II-XII: Pupils were equal round reactive to light extraocular movements were full, Visual field were full on confrontational test. Bilateral fundi were sharp.  Facial sensation and strength were normal. Hearing was intact to finger rubbing bilaterally. Uvula tongue midline.  head turning and shoulder shrug and were normal and symmetric.Tongue protrusion into cheek strength was normal.  Motor: normal tone, bulk and strength.  Sensory: Intact to fine touch, pinprick, preserved vibratory sensation, and proprioception at toes.  Coordination: Normal finger to nose, heel-to-shin bilaterally there was no truncal ataxia  Gait: Rising up from seated position without assistance, normal stance, without trunk ataxia, moderate stride, good arm swing, smooth turning, able to perform tiptoe, and heel walking without difficulty.   Romberg signs:  Negative  Deep tendon reflexes: Brachioradialis 2/2, biceps 2/2, triceps 2/2, patellar 2/2, Achilles 2/2, plantar responses were flexor bilaterally.   DIAGNOSTIC DATA (LABS, IMAGING, TESTING) - I reviewed patient records, labs, notes, testing and imaging myself where available.  Lab Results  Component Value Date   WBC 10.3 12/14/2012   HGB 14.3 12/14/2012   HCT 43.8 12/14/2012   MCV 86.1 12/14/2012   PLT 265 12/14/2012      Component Value Date/Time   NA 138 12/14/2012 0434   K 3.3* 12/14/2012 0434   CL 102 12/14/2012 0434  CO2 29 12/14/2012 0434   GLUCOSE 139* 12/14/2012 0434   BUN 15 12/14/2012 0434   CREATININE 0.65 12/14/2012 0434   CALCIUM 9.0 12/14/2012 0434   PROT 6.5 07/03/2008 0930   ALBUMIN 3.3* 07/03/2008 0930   AST 21 07/03/2008 0930   ALT 25 07/03/2008 0930   ALKPHOS 75 07/03/2008 0930   BILITOT 0.5 07/03/2008 0930   GFRNONAA 89* 12/14/2012 0434   GFRAA >90 12/14/2012 0434   ASSESSMENT AND PLAN   67 years old Caucasian female, with vascular risk factor of obesity, hypertension, diabetes, presenting with 2 episode award finding difficulties, differentiation diagnosis including left frontal TIA,   1. will complete evaluation with MRI of brain 2, ultrasound of carotid artery 3. Echocardiogram 4 keep daily aspirin 5 return to clinic in 3 months with Gerlene Fee, M.D. Ph.D.  [facility] [address] [place], [place] [address] [phone]

## 2013-03-02 ENCOUNTER — Encounter: Payer: Self-pay | Admitting: Internal Medicine

## 2013-03-03 ENCOUNTER — Ambulatory Visit: Payer: 59 | Admitting: Internal Medicine

## 2013-03-17 ENCOUNTER — Ambulatory Visit (INDEPENDENT_AMBULATORY_CARE_PROVIDER_SITE_OTHER): Payer: 59 | Admitting: Internal Medicine

## 2013-03-17 ENCOUNTER — Encounter: Payer: Self-pay | Admitting: Internal Medicine

## 2013-03-17 VITALS — BP 132/74 | HR 72 | Ht 59.5 in | Wt 254.4 lb

## 2013-03-17 DIAGNOSIS — G4733 Obstructive sleep apnea (adult) (pediatric): Secondary | ICD-10-CM

## 2013-03-17 DIAGNOSIS — J209 Acute bronchitis, unspecified: Secondary | ICD-10-CM

## 2013-03-17 NOTE — Progress Notes (Signed)
02/27/11- 65 yoF never smoker, followed for obstructive sleep apnea complicated by HBP, history of bronchitis. LOV- 02/28/2010. At last visit we had recommended change from lisinopril because of cough. She was changed to a ARB and her cough resolved. She declines flu vaccine. She reports that she is quite comfortable with CPAP at 11 CWP/[facility], with good compliance and control. She is using CPAP all night every night and she denies daytime sleepiness.  02/26/12- 65 yoF never smoker, followed for obstructive sleep apnea complicated by HBP, history of bronchitis. FOLLOWS FOR: wears CPAP every night for about 10 hours; new supplies on 03-05-12; pressure working well for patient.  She declines flu vaccine Continues CPAP 11/[facility] all night every night using a nasal mask. She does not need and feels well controlled.  03/17/13- 67 yoF never smoker, followed for obstructive sleep apnea complicated by HBP, history of bronchitis Follows for:  Pt wears CPAP 11/ [facility] nightly for roughly 8 hours.  Pt has no complaints with her cpap machine, but wishes to change dme providers due to the fact that [facility] is difficult to get in touch with and has given her trouble in the past.  Describes good compliance and control  Bronchitis-recent mild cough and postnasal drip without sore throat or fever  ROS-see HPI Constitutional:   No-   weight loss, night sweats, fevers, chills, fatigue, lassitude. HEENT:   No-  headaches, difficulty swallowing, tooth/dental problems, sore throat,       No-  sneezing, itching, ear ache, nasal congestion, post nasal drip,  CV:  No-   chest pain, orthopnea, PND, swelling in lower extremities, anasarca, dizziness, palpitations Resp: No-   shortness of breath with exertion or at rest.              No-   productive cough, + non-productive cough,  No- coughing up of blood.              No-   change in color of mucus.  No- wheezing.   Skin: No-   rash or lesions. GI:  No-   heartburn,  indigestion, abdominal pain, nausea, vomiting,  GU: . MS:  No-   joint pain or swelling.  Neuro-     nothing unusual Psych:  No- change in mood or affect. No depression or anxiety.  No memory loss.  OBJ General- Alert, Oriented, Affect-appropriate, Distress- none acute; quite obese Skin- rash-none, lesions- none, excoriation- none Lymphadenopathy- none Head- atraumatic            Eyes- Gross vision intact, PERRLA, conjunctivae clear secretions            Ears- Hearing, canals-normal            Nose- Clear, no-Septal dev, mucus, polyps, erosion, perforation             Throat- Mallampati III , mucosa clear , drainage- none, tonsils- atrophic Neck- flexible , trachea midline, no stridor , thyroid nl, carotid no bruit Chest - symmetrical excursion , unlabored           Heart/CV- RRR , no murmur , no gallop  , no rub, nl s1 s2                           - JVD- none , edema- none, stasis changes- none, varices- none           Lung- clear to P&A, wheeze- none, cough+ , dullness-none, rub- none  Chest wall-  Abd-  Br/ Gen/ Rectal- Not done, not indicated Extrem- cyanosis- none, clubbing, none, atrophy- none, strength- nl, cane Neuro- grossly intact to observation

## 2013-03-17 NOTE — Patient Instructions (Signed)
Order DME - she would like to change DME provider from [facility] for better service        CPAP 11, mask of choice, humidifier, supplies       Dx OSA  Please call as needed

## 2013-03-20 ENCOUNTER — Encounter: Payer: Self-pay | Admitting: Cardiology

## 2013-03-20 ENCOUNTER — Other Ambulatory Visit (HOSPITAL_COMMUNITY): Payer: Self-pay | Admitting: Neurology

## 2013-03-20 ENCOUNTER — Ambulatory Visit (HOSPITAL_COMMUNITY): Payer: 59 | Attending: Cardiology | Admitting: Cardiology

## 2013-03-20 DIAGNOSIS — I10 Essential (primary) hypertension: Secondary | ICD-10-CM | POA: Insufficient documentation

## 2013-03-20 DIAGNOSIS — Z8673 Personal history of transient ischemic attack (TIA), and cerebral infarction without residual deficits: Secondary | ICD-10-CM | POA: Insufficient documentation

## 2013-03-20 DIAGNOSIS — G459 Transient cerebral ischemic attack, unspecified: Secondary | ICD-10-CM

## 2013-03-20 DIAGNOSIS — R0609 Other forms of dyspnea: Secondary | ICD-10-CM | POA: Insufficient documentation

## 2013-03-20 DIAGNOSIS — R4701 Aphasia: Secondary | ICD-10-CM

## 2013-03-20 DIAGNOSIS — R0989 Other specified symptoms and signs involving the circulatory and respiratory systems: Secondary | ICD-10-CM | POA: Insufficient documentation

## 2013-03-20 DIAGNOSIS — I079 Rheumatic tricuspid valve disease, unspecified: Secondary | ICD-10-CM | POA: Insufficient documentation

## 2013-03-20 DIAGNOSIS — E119 Type 2 diabetes mellitus without complications: Secondary | ICD-10-CM | POA: Insufficient documentation

## 2013-03-20 DIAGNOSIS — Z6841 Body Mass Index (BMI) 40.0 and over, adult: Secondary | ICD-10-CM | POA: Insufficient documentation

## 2013-03-20 NOTE — Progress Notes (Signed)
Echo performed. 

## 2013-03-21 ENCOUNTER — Ambulatory Visit (INDEPENDENT_AMBULATORY_CARE_PROVIDER_SITE_OTHER): Payer: 59

## 2013-03-21 DIAGNOSIS — R4701 Aphasia: Secondary | ICD-10-CM

## 2013-03-22 ENCOUNTER — Ambulatory Visit
Admission: RE | Admit: 2013-03-22 | Discharge: 2013-03-22 | Disposition: A | Discharge status: Home / Self Care | Payer: 59 | Source: Ambulatory Visit | Attending: Neurology | Admitting: Neurology

## 2013-03-22 DIAGNOSIS — R4701 Aphasia: Secondary | ICD-10-CM

## 2013-03-27 NOTE — Progress Notes (Signed)
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 ______

## 2013-03-28 ENCOUNTER — Telehealth: Payer: Self-pay | Admitting: Neurology

## 2013-03-28 NOTE — Telephone Encounter (Signed)
Spoke to patient and relayed MRI brain results and ECHO results, per Dr. Krista Blue.  Patient had no questions and expressed understanding.

## 2013-03-28 NOTE — Telephone Encounter (Signed)
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  ECHO showed  ejection fraction of 55% to 60%. Wall motion was normal;  No cardiac source of emboli was indentified.

## 2013-04-07 ENCOUNTER — Encounter: Payer: Self-pay | Admitting: Internal Medicine

## 2013-04-07 ENCOUNTER — Telehealth: Payer: Self-pay | Admitting: Internal Medicine

## 2013-04-07 ENCOUNTER — Ambulatory Visit (INDEPENDENT_AMBULATORY_CARE_PROVIDER_SITE_OTHER): Payer: 59 | Admitting: Internal Medicine

## 2013-04-07 ENCOUNTER — Ambulatory Visit (INDEPENDENT_AMBULATORY_CARE_PROVIDER_SITE_OTHER)
Admission: RE | Admit: 2013-04-07 | Discharge: 2013-04-07 | Disposition: A | Discharge status: Home / Self Care | Payer: 59 | Source: Ambulatory Visit | Attending: Internal Medicine | Admitting: Internal Medicine

## 2013-04-07 VITALS — BP 140/82 | HR 77 | Ht 59.5 in

## 2013-04-07 DIAGNOSIS — J209 Acute bronchitis, unspecified: Secondary | ICD-10-CM

## 2013-04-07 DIAGNOSIS — G4733 Obstructive sleep apnea (adult) (pediatric): Secondary | ICD-10-CM

## 2013-04-07 MED ORDER — BENZONATATE 200 MG PO CAPS: 200.0000 mg | ORAL_CAPSULE | Freq: Three times a day (TID) | ORAL | Status: DC | PRN

## 2013-04-07 MED ORDER — METHYLPREDNISOLONE ACETATE 80 MG/ML IJ SUSP
80.0000 mg | Freq: Once | INTRAMUSCULAR | Status: AC
  Administered 2013-04-07: 80 mg via INTRAMUSCULAR

## 2013-04-07 MED ORDER — LEVALBUTEROL HCL 0.63 MG/3ML IN NEBU
0.6300 mg | INHALATION_SOLUTION | Freq: Once | RESPIRATORY_TRACT | Status: AC
  Administered 2013-04-07: 0.63 mg via RESPIRATORY_TRACT

## 2013-04-07 MED ORDER — AZITHROMYCIN 250 MG PO TABS: ORAL_TABLET | ORAL | Status: DC

## 2013-04-07 NOTE — Telephone Encounter (Signed)
Pt called back and she scheduled appt to see CDY this afternoon. Nothing further needed

## 2013-04-07 NOTE — Telephone Encounter (Signed)
Called and spoke with the pharmacy and they stated that they did not have any allergies for this medication but when they tried to run the rx through the system it came up as an allergy to this medication.  Pharmacy will call the pt to get further info from the pt.  They are aware that CY sent this rx in from her OV today. Pharmacy to call back for any further recs if needed.

## 2013-04-07 NOTE — Telephone Encounter (Signed)
Per CY-anytime open with him or TP is fine. Thanks.

## 2013-04-07 NOTE — Progress Notes (Signed)
02/27/11- 65 yoF never smoker, followed for obstructive sleep apnea complicated by HBP, history of bronchitis. LOV- 02/28/2010. At last visit we had recommended change from lisinopril because of cough. She was changed to a ARB and her cough resolved. She declines flu vaccine. She reports that she is quite comfortable with CPAP at 11 CWP/[facility], with good compliance and control. She is using CPAP all night every night and she denies daytime sleepiness.  02/26/12- 65 yoF never smoker, followed for obstructive sleep apnea complicated by HBP, history of bronchitis. FOLLOWS FOR: wears CPAP every night for about 10 hours; new supplies on 03-05-12; pressure working well for patient.  She declines flu vaccine Continues CPAP 11/[facility] all night every night using a nasal mask. She does not need and feels well controlled.  03/17/13- 67 yoF never smoker, followed for obstructive sleep apnea complicated by HBP, history of bronchitis Follows for:  Pt wears CPAP nightly for roughly 8 hours.  Pt has no complaints with her cpap machine, but wishes to change dme providers due to the fact that [facility] is difficult to get in touch with and has given her trouble in the past.    04/07/13-  67 yoF never smoker, followed for obstructive sleep apnea complicated by HBP, history of bronchitis   Husband here Pt states she still has a cold-coughing so hard that she gets fatigued and unable to eat-gest choked. Chest pressure/tightness, wheezing at times. Was unable to eat lunch today due to cough(had salad with vinger dressint). Increased phelgm(stuck feeling) in throat. Spasms of cough when eating. CPAP 11/[facility]  ROS-see HPI Constitutional:   No-   weight loss, night sweats, fevers, chills, +fatigue, lassitude. HEENT:   No-  headaches, difficulty swallowing, tooth/dental problems, sore throat,       No-  sneezing, itching, ear ache, nasal congestion, post nasal drip,  CV:  No-   chest pain, orthopnea, PND, swelling in  lower extremities, anasarca, dizziness, palpitations Resp: +shortness of breath with exertion or at rest.              No-   productive cough,  + non-productive cough,  No- coughing up of blood.              No-   change in color of mucus.  No- wheezing.   Skin: No-   rash or lesions. GI:  No-   heartburn, indigestion, abdominal pain, nausea, vomiting,  GU: . MS:  No-   joint pain or swelling.  Neuro-     nothing unusual Psych:  No- change in mood or affect. No depression or anxiety.  No memory loss.   OBJ General- Alert, Oriented, Affect-appropriate, Distress- none acute; quite obese Skin- rash-none, lesions- none, excoriation- none Lymphadenopathy- none Head- atraumatic            Eyes- Gross vision intact, PERRLA, conjunctivae clear secretions            Ears- Hearing, canals-normal            Nose- Clear, no-Septal dev, mucus, polyps, erosion, perforation             Throat- Mallampati III , mucosa clear/ not red , drainage- none, tonsils- atrophic Neck- flexible , trachea midline, no stridor , thyroid nl, carotid no bruit Chest - symmetrical excursion , unlabored           Heart/CV- RRR , no murmur , no gallop  , no rub, nl s1 s2                           -  JVD- none , edema- none, stasis changes- none, varices- none           Lung- clear to P&A, wheeze- none, cough- none , dullness-none, rub- none           Chest wall-  Abd-  Br/ Gen/ Rectal- Not done, not indicated Extrem- cyanosis- none, clubbing, none, atrophy- none, strength- nl, cane Neuro- grossly intact to observation

## 2013-04-07 NOTE — Telephone Encounter (Signed)
Spoke with Nicole Cabrera. She c/o prod cough-clear phlem, wheezing, chest tx, feels tired, increase SOB w/ exertion and rest x December. Looking in Nicole Cabrera chart I see CDY has only seen her for OSA. Nicole Cabrera reports he has given her a breathing TX in the past and an inhaler. She is requesting to be seen this afternoon for breathing TX. Please advise Dr. Annamaria Boots thanks Last [place] 03/17/13 w/ CDY for OSA Allergies  Allergen Reactions  . Bactrim [Sulfamethoxazole-Tmp Ds]   . Lisinopril Cough  . Spironolactone Nausea Only  . Vistaril [Hydroxyzine Hcl] Other (See Comments)    Nicole Cabrera becomes physchotic.  Marland Kitchen Erythromycin Rash  . Latex Rash  . Penicillins Rash  . Tetracycline Rash

## 2013-04-07 NOTE — Patient Instructions (Signed)
Script for Z pak sent  Neb Xop 0.63  Depo 58  Ok to use the Robitussin cough syrup. If you use your tessalon/ benzonatate, try 200 mg every 8 hours as needed  Order CXR  Dx acute bronchitis

## 2013-04-07 NOTE — Telephone Encounter (Signed)
ATC home #--phone rang several times and no VM Called mobile # listed-- lmtcb x1 for pt

## 2013-04-09 NOTE — Assessment & Plan Note (Signed)
Plan-continue CPAP 11 but change to a different DME at patient request

## 2013-04-09 NOTE — Assessment & Plan Note (Signed)
Minor exacerbations/URI. Treat symptomatically

## 2013-04-10 ENCOUNTER — Telehealth: Payer: Self-pay | Admitting: Internal Medicine

## 2013-04-10 NOTE — Telephone Encounter (Signed)
Notes Recorded by Deneise Lever, MD on 04/07/2013 at 8:29 PM CXR- Heart is enlarged but stable, with some increased fluid presure in blood vessels but no heart failure, pneumonia or other acute process.  Pt was given results this AM but had a few questions about the results. All questions answered, nothing further needed.[place], CMA

## 2013-04-10 NOTE — Telephone Encounter (Signed)
Result Notes    Notes Recorded by Lu Duffel on 04/10/2013 at 9:21 AM Advised pt of cxr result per CY. Pt verbalized understanding and has no further questions ------  Notes Recorded by Deneise Lever, MD on 04/07/2013 at 8:29 PM CXR- Heart is enlarged but stable, with some increased fluid presure in blood vessels but no heart failure, pneumonia or other acute process.   Spoke with Amy at [facility] spoke with patient and her spouse about concerns with Azithromycin Rx-she is able to take and no concerns. Will sign off on message.

## 2013-04-10 NOTE — Telephone Encounter (Signed)
Patient is also wanting to speak to nurse about her xray results.

## 2013-04-11 ENCOUNTER — Encounter: Payer: Self-pay | Admitting: Obstetrics & Gynecology

## 2013-04-13 ENCOUNTER — Telehealth: Payer: Self-pay | Admitting: Obstetrics & Gynecology

## 2013-04-13 ENCOUNTER — Ambulatory Visit: Payer: Self-pay | Admitting: Obstetrics & Gynecology
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
## 2013-05-06 NOTE — Assessment & Plan Note (Signed)
Plan-Z-Pak, nebulizer Xopenex, Depo-Medrol, chest x-ray

## 2013-05-06 NOTE — Assessment & Plan Note (Signed)
Good compliance and control 

## 2013-05-11 ENCOUNTER — Ambulatory Visit: Payer: 59 | Admitting: Obstetrics & Gynecology

## 2013-05-26 ENCOUNTER — Ambulatory Visit: Payer: 59 | Admitting: Obstetrics & Gynecology

## 2013-06-01 ENCOUNTER — Ambulatory Visit: Payer: 59 | Admitting: Nurse Practitioner

## 2013-06-09 ENCOUNTER — Encounter: Payer: Self-pay | Admitting: Internal Medicine

## 2013-06-11 ENCOUNTER — Encounter (HOSPITAL_COMMUNITY): Payer: Self-pay | Admitting: Emergency Medicine

## 2013-06-11 ENCOUNTER — Emergency Department (HOSPITAL_COMMUNITY): Payer: 59

## 2013-06-11 ENCOUNTER — Emergency Department (HOSPITAL_COMMUNITY)
Admission: EM | Admit: 2013-06-11 | Discharge: 2013-06-11 | Disposition: A | Discharge status: Home / Self Care | Payer: 59 | Attending: Emergency Medicine | Admitting: Emergency Medicine

## 2013-06-11 DIAGNOSIS — E119 Type 2 diabetes mellitus without complications: Secondary | ICD-10-CM | POA: Insufficient documentation

## 2013-06-11 DIAGNOSIS — I129 Hypertensive chronic kidney disease with stage 1 through stage 4 chronic kidney disease, or unspecified chronic kidney disease: Secondary | ICD-10-CM | POA: Insufficient documentation

## 2013-06-11 DIAGNOSIS — R059 Cough, unspecified: Secondary | ICD-10-CM | POA: Insufficient documentation

## 2013-06-11 DIAGNOSIS — Z8673 Personal history of transient ischemic attack (TIA), and cerebral infarction without residual deficits: Secondary | ICD-10-CM | POA: Insufficient documentation

## 2013-06-11 DIAGNOSIS — B9789 Other viral agents as the cause of diseases classified elsewhere: Secondary | ICD-10-CM | POA: Insufficient documentation

## 2013-06-11 DIAGNOSIS — Z87442 Personal history of urinary calculi: Secondary | ICD-10-CM | POA: Insufficient documentation

## 2013-06-11 DIAGNOSIS — Z7982 Long term (current) use of aspirin: Secondary | ICD-10-CM | POA: Insufficient documentation

## 2013-06-11 DIAGNOSIS — Z8742 Personal history of other diseases of the female genital tract: Secondary | ICD-10-CM | POA: Insufficient documentation

## 2013-06-11 DIAGNOSIS — N189 Chronic kidney disease, unspecified: Secondary | ICD-10-CM | POA: Insufficient documentation

## 2013-06-11 DIAGNOSIS — B349 Viral infection, unspecified: Secondary | ICD-10-CM

## 2013-06-11 DIAGNOSIS — F411 Generalized anxiety disorder: Secondary | ICD-10-CM | POA: Insufficient documentation

## 2013-06-11 DIAGNOSIS — Z8669 Personal history of other diseases of the nervous system and sense organs: Secondary | ICD-10-CM | POA: Insufficient documentation

## 2013-06-11 DIAGNOSIS — E669 Obesity, unspecified: Secondary | ICD-10-CM | POA: Insufficient documentation

## 2013-06-11 DIAGNOSIS — Z8739 Personal history of other diseases of the musculoskeletal system and connective tissue: Secondary | ICD-10-CM | POA: Insufficient documentation

## 2013-06-11 DIAGNOSIS — Z79899 Other long term (current) drug therapy: Secondary | ICD-10-CM | POA: Insufficient documentation

## 2013-06-11 DIAGNOSIS — Z792 Long term (current) use of antibiotics: Secondary | ICD-10-CM | POA: Insufficient documentation

## 2013-06-11 DIAGNOSIS — Z9981 Dependence on supplemental oxygen: Secondary | ICD-10-CM | POA: Insufficient documentation

## 2013-06-11 DIAGNOSIS — Z880 Allergy status to penicillin: Secondary | ICD-10-CM | POA: Insufficient documentation

## 2013-06-11 DIAGNOSIS — M19019 Primary osteoarthritis, unspecified shoulder: Secondary | ICD-10-CM | POA: Insufficient documentation

## 2013-06-11 DIAGNOSIS — R05 Cough: Secondary | ICD-10-CM | POA: Insufficient documentation

## 2013-06-11 DIAGNOSIS — Z91040 Latex allergy status: Secondary | ICD-10-CM | POA: Insufficient documentation

## 2013-06-11 DIAGNOSIS — G4730 Sleep apnea, unspecified: Secondary | ICD-10-CM | POA: Insufficient documentation

## 2013-06-11 DIAGNOSIS — Z8709 Personal history of other diseases of the respiratory system: Secondary | ICD-10-CM | POA: Insufficient documentation

## 2013-06-11 MED ORDER — ALBUTEROL SULFATE HFA 108 (90 BASE) MCG/ACT IN AERS: 1.0000 | INHALATION_SPRAY | Freq: Four times a day (QID) | RESPIRATORY_TRACT | Status: DC | PRN

## 2013-06-11 MED ORDER — HYDROCOD POLST-CHLORPHEN POLST 10-8 MG/5ML PO LQCR: 5.0000 mL | Freq: Two times a day (BID) | ORAL | Status: DC | PRN

## 2013-06-11 MED ORDER — OSELTAMIVIR PHOSPHATE 75 MG PO CAPS: 75.0000 mg | ORAL_CAPSULE | Freq: Two times a day (BID) | ORAL | Status: DC

## 2013-06-11 NOTE — Discharge Instructions (Signed)
Tylenol for fever. Increase fluids. Prescriptions for Tamiflu, cough syrup, inhaler. Followup your Dr.

## 2013-06-11 NOTE — ED Notes (Signed)
Pt alert & oriented x4, stable gait. Patient given discharge instructions, paperwork & prescription(s). Patient  instructed to stop at the registration desk to finish any additional paperwork. Patient verbalized understanding. Pt left department w/ no further questions. 

## 2013-06-11 NOTE — ED Provider Notes (Signed)
CSN: [id number]     Arrival date & time 06/11/13  1114 History  This chart was scribed for Nat Christen, MD by Marcha Dutton, ED Scribe. This patient was seen in room APA06/APA06 and the patient's care was started at 11:50 AM.    Chief Complaint  Patient presents with  . Fever     The history is provided by the patient. No language interpreter was used.   HPI Comments: Nicole Cabrera is a 68 y.o. female with h/o DM, sleep apnea, and TIA episodes who presents to the Emergency Department complaining of a fever (98.36F per triage)  that began yesterday. She reports a fever 101.7 F last night that gradually decreased to about 99.0 F with the use of Tylenol. She reports associated cough and headache. Pt denies decreased appetite and SOB. Pt is ambulatory.  Severity is mild.  Past Medical History  Diagnosis Date  . Sleep apnea     CPAP  . Hypertension   . Obesity   . Bronchitis   . Hemorrhoids   . Chronic kidney disease     kidney stones  . Anxiety     xanax  . Shortness of breath     with exertion  . Diabetes mellitus     borderline - no meds  . Neuromuscular disorder     peripheral neuropathy-feet  . Peripheral neuropathy   . Arthritis     shoulders/hips - tx with OTC meds  . Breast cyst 03/1990  . PMB (postmenopausal bleeding) 04/2000  . Endometrial polyp 04/2000  . Osteopenia 05/2007  . 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 Cervical tab 1972  . SVD (spontaneous vaginal delivery) 04/1964, 05/1978    x 2  . SAB (spontaneous abortion) 1981    Past Surgical History  Procedure Laterality Date  . Total knee arthroplasty Right     right  . Total knee arthroplasty Left     left -   . Cesarean section    . Wrist surgery Left     left  . Cervical disc surgery    . Dilation and curettage of uterus      , polyp removal  . Upper gastrointestinal endoscopy    . Colonoscopy  2007    2007  . Tubal ligation    . Cervical laminectomy  04/1991  . Hysteroscopy   01/2011    Family History  Problem Relation Age of Onset  . Heart disease Father   . Colon cancer Mother   . Heart disease Mother   . Breast cancer Maternal Aunt   . Colon cancer Maternal Aunt   . Colon cancer Maternal Uncle     History  Substance Use Topics  . Smoking status: Never Smoker   . Smokeless tobacco: Never Used  . Alcohol Use: Yes     Comment: socially - wine    OB History   Grav Para Term Preterm Abortions TAB SAB Ect Mult Living   5 3   2 1 1          Review of Systems  Respiratory: Positive for cough.    A complete 10 system review of systems was obtained and all systems are negative except as noted in the HPI and PMH.     Allergies  Bactrim; Lisinopril; Spironolactone; Vistaril; Erythromycin; Latex; Penicillins; and Tetracycline  Home Medications   Current Outpatient Rx  Name  Route  Sig  Dispense  Refill  . acetaminophen (TYLENOL) 500 MG tablet  Oral   Take 500 mg by mouth every 6 (six) hours as needed. For cramps.          Marland Kitchen aspirin EC 81 MG tablet   Oral   Take 81 mg by mouth daily.           Marland Kitchen azithromycin (ZITHROMAX) 250 MG tablet      2 today then one daily   6 each   0   . benzonatate (TESSALON) 200 MG capsule   Oral   Take 1 capsule (200 mg total) by mouth 3 (three) times daily as needed for cough.   30 capsule   0     Dispense as written.   Marland Kitchen losartan-hydrochlorothiazide (HYZAAR) 100-25 MG per tablet   Oral   Take 1 tablet by mouth daily.           . meclizine (ANTIVERT) 25 MG tablet   Oral   Take by mouth as needed. 1/2-1 tab three times a day for vertigo         . metFORMIN (GLUCOPHAGE-XR) 500 MG 24 hr tablet   Oral   Take 500 mg by mouth daily.         . Multiple Vitamins-Minerals (MULTIVITAMIN WITH MINERALS) tablet   Oral   Take 1 tablet by mouth daily.            Triage Vitals: BP 156/84  Pulse 81  Temp(Src) 98.4 F (36.9 C) (Oral)  Ht 5' (1.524 m)  Wt 248 lb (112.492 kg)  BMI 48.43 kg/m2   SpO2 93%  Physical Exam  Nursing note and vitals reviewed. Constitutional: She is oriented to person, place, and time. She appears well-developed and well-nourished.  Coughing and obese  HENT:  Head: Normocephalic and atraumatic.  Eyes: Conjunctivae and EOM are normal.  Pulmonary/Chest: Effort normal and breath sounds normal.  Neurological: She is alert and oriented to person, place, and time.  Skin: Skin is warm and dry.  Psychiatric: She has a normal mood and affect. Her behavior is normal.    ED Course  Procedures (including critical care time)  DIAGNOSTIC STUDIES: Oxygen Saturation is 93% on RA, low by my interpretation.    COORDINATION OF CARE: 11:50 AM- Will order tamiflu, albuterol inhaler, and cough syrup. Pt advised of plan for treatment and pt agrees.  Labs Review Labs Reviewed - No data to display Imaging Review No results found.   EKG Interpretation None      MDM   Final diagnoses:  None    Patient is nontoxic. No respiratory distress. Rx Tamiflu, Tussionex, albuterol inhaler.  I personally performed the services described in this documentation, which was scribed in my presence. The recorded information has been reviewed and is accurate.    Nat Christen, MD 06/11/13 1309

## 2013-06-11 NOTE — ED Notes (Signed)
Pt c/o cough, fever, headache since last night.  Has been taking tylenol prn.   Last dose was this am around 0630.

## 2013-06-27 ENCOUNTER — Ambulatory Visit (INDEPENDENT_AMBULATORY_CARE_PROVIDER_SITE_OTHER): Payer: 59 | Admitting: Internal Medicine

## 2013-06-27 ENCOUNTER — Encounter: Payer: Self-pay | Admitting: Internal Medicine

## 2013-06-27 VITALS — BP 146/88 | HR 75 | Ht 59.5 in | Wt 258.0 lb

## 2013-06-27 DIAGNOSIS — G4733 Obstructive sleep apnea (adult) (pediatric): Secondary | ICD-10-CM

## 2013-06-27 DIAGNOSIS — J209 Acute bronchitis, unspecified: Secondary | ICD-10-CM

## 2013-06-27 MED ORDER — METHYLPREDNISOLONE ACETATE 80 MG/ML IJ SUSP
80.0000 mg | Freq: Once | INTRAMUSCULAR | Status: AC
  Administered 2013-06-27: 80 mg via INTRAMUSCULAR

## 2013-06-27 MED ORDER — LEVALBUTEROL HCL 0.63 MG/3ML IN NEBU
0.6300 mg | INHALATION_SOLUTION | Freq: Once | RESPIRATORY_TRACT | Status: AC
  Administered 2013-06-27: 0.63 mg via RESPIRATORY_TRACT

## 2013-06-27 MED ORDER — AZITHROMYCIN 250 MG PO TABS: ORAL_TABLET | ORAL | Status: DC

## 2013-06-27 NOTE — Progress Notes (Signed)
02/27/11- 65 yoF never smoker, followed for obstructive sleep apnea complicated by HBP, history of bronchitis. LOV- 02/28/2010. At last visit we had recommended change from lisinopril because of cough. She was changed to a ARB and her cough resolved. She declines flu vaccine. She reports that she is quite comfortable with CPAP at 11 CWP/Advanced, with good compliance and control. She is using CPAP all night every night and she denies daytime sleepiness.  02/26/12- 65 yoF never smoker, followed for obstructive sleep apnea complicated by HBP, history of bronchitis. FOLLOWS FOR: wears CPAP every night for about 10 hours; new supplies on 03-05-12; pressure working well for patient.  She declines flu vaccine Continues CPAP 11/Advanced all night every night using a nasal mask. She does not need and feels well controlled.  03/17/13- 67 yoF never smoker, followed for obstructive sleep apnea complicated by HBP, history of bronchitis Follows for:  Pt wears CPAP nightly for roughly 8 hours.  Pt has no complaints with her cpap machine, but wishes to change dme providers due to the fact that [facility] is difficult to get in touch with and has given her trouble in the past.    04/07/13-  67 yoF never smoker, followed for obstructive sleep apnea complicated by HBP, history of bronchitis   Husband here Pt states she still has a cold-coughing so hard that she gets fatigued and unable to eat-gets choked. Chest pressure/tightness, wheezing at times. Was unable to eat lunch today due to cough(had salad with vinger dressing). Increased phelgm(stuck feeling) in throat. Spasms of cough when eating. CPAP 11/Advanced  06/27/13- 68 yoF never smoker, followed for obstructive sleep apnea complicated by HBP, history of  Bronchitis CPAP 11/ [facility] doing well. FOLLOWS FOR:had ? flu 1-2 mths ago,has had cough since then-clear,sob with exertion,occass. wheezing,denies cp or tightness,sweats with coughing,,wearing CPAP 8-9  hrs.each nigh,pr. good Residual cough since flu.  CXR 06/11/13 IMPRESSION:  Bibasilar atelectasis.  Electronically Signed  By: Maryclare Bean M.D.  On: 06/11/2013 11:52  ROS-see HPI Constitutional:   No-   weight loss, night sweats, fevers, chills, +fatigue, lassitude. HEENT:   No-  headaches, difficulty swallowing, tooth/dental problems, sore throat,       No-  sneezing, itching, ear ache, nasal congestion, post nasal drip,  CV:  No-   chest pain, orthopnea, PND, swelling in lower extremities, anasarca, dizziness, palpitations Resp: +shortness of breath with exertion or at rest.              No-   productive cough,  + non-productive cough,  No- coughing up of blood.              No-   change in color of mucus.  No- wheezing.   Skin: No-   rash or lesions. GI:  No-   heartburn, indigestion, abdominal pain, nausea, vomiting,  GU: . MS:  No-   joint pain or swelling.  Neuro-     nothing unusual Psych:  No- change in mood or affect. No depression or anxiety.  No memory loss.   OBJ General- Alert, Oriented, Affect-appropriate, Distress- none acute; quite obese Skin- rash-none, lesions- none, excoriation- none Lymphadenopathy- none Head- atraumatic            Eyes- Gross vision intact, PERRLA, conjunctivae clear secretions            Ears- Hearing, canals-normal            Nose- Clear, no-Septal dev, mucus, polyps, erosion, perforation  Throat- Mallampati III , mucosa clear/ not red , drainage- none, tonsils- atrophic Neck- flexible , trachea midline, no stridor , thyroid nl, carotid no bruit Chest - symmetrical excursion , unlabored           Heart/CV- RRR , no murmur , no gallop  , no rub, nl s1 s2                           - JVD- none , edema- none, stasis changes- none, varices- none           Lung- clear to P&A, wheeze- none, cough+raspy , dullness-none, rub- none           Chest wall-  Abd-  Br/ Gen/ Rectal- Not done, not indicated Extrem- cyanosis- none, clubbing,  none, atrophy- none, strength- nl, cane Neuro- grossly intact to observation

## 2013-06-27 NOTE — Patient Instructions (Signed)
Script sent for Z pak  Neb xop 0.63  Depo 80  We can continue CPAP 11/ [facility]

## 2013-06-28 ENCOUNTER — Telehealth: Payer: Self-pay | Admitting: Internal Medicine

## 2013-06-28 NOTE — Telephone Encounter (Signed)
Called and spoke with pharmacist at [facility]  She states that they did receive the rx for zpack but they thought that she was allergic to this  I advised that we just went over her allergies yesterday and she is not allergic to zithromax  I confirmed this with the pt  Pharm aware and they will have zpack ready  Nothing further needed

## 2013-06-28 NOTE — Telephone Encounter (Signed)
Called [facility] and was advised the pharm does not come in until 9 am. [facility] Epic shows RX was sent.

## 2013-06-29 ENCOUNTER — Ambulatory Visit: Payer: 59 | Admitting: Obstetrics & Gynecology

## 2013-06-29 ENCOUNTER — Ambulatory Visit (INDEPENDENT_AMBULATORY_CARE_PROVIDER_SITE_OTHER): Payer: 59 | Admitting: Obstetrics & Gynecology

## 2013-06-29 ENCOUNTER — Encounter: Payer: Self-pay | Admitting: Obstetrics & Gynecology

## 2013-06-29 VITALS — BP 130/78 | HR 60 | Resp 25 | Ht 59.5 in | Wt 254.4 lb

## 2013-06-29 DIAGNOSIS — Z124 Encounter for screening for malignant neoplasm of cervix: Secondary | ICD-10-CM

## 2013-06-29 DIAGNOSIS — Z01419 Encounter for gynecological examination (general) (routine) without abnormal findings: Secondary | ICD-10-CM

## 2013-06-29 DIAGNOSIS — Z1239 Encounter for other screening for malignant neoplasm of breast: Secondary | ICD-10-CM

## 2013-06-29 DIAGNOSIS — Z0000 Encounter for general adult medical examination without abnormal findings: Secondary | ICD-10-CM

## 2013-06-29 LAB — POCT URINALYSIS DIPSTICK
Bilirubin, UA: NEGATIVE
Glucose, UA: NEGATIVE
Ketones, UA: NEGATIVE
Leukocytes, UA: NEGATIVE
Nitrite, UA: NEGATIVE
Protein, UA: NEGATIVE
Urobilinogen, UA: NEGATIVE
pH, UA: 7

## 2013-06-29 NOTE — Patient Instructions (Signed)

## 2013-06-29 NOTE — Progress Notes (Signed)
68 y.o. [id number] MarriedCaucasianF here for annual exam.  Went to ER 3/22 with probable flu.  Treated with Tamiflu.  Also went to ER in 9/14 with URI issues.  Saw Dr. Annamaria Boots two days ago.  Given steroid injection.  Started on Z pak.  CXR was negative.    Patient had a little bit of spotting after unloading a car about a week ago.  She is unsure if was vaginal or rectal or urinary.  Was pink when she wiped once and then stopped.  H/O bright red vaginal bleeding 2012 (which was different than most recent episode).  U/S and biopsy were negative.  Hysteroscopy with D&C was negative 11/12.  Being followed for "borderline DM".  HbA1Cs are in low six range.  Has rx for Metformin but not taking it as HBA1C lowered a little on it's own.  She didn't tell PCP she wasn't taking it as she wanted to see what would happen on it's own.  Has follow up scheduled.  Going about every four months now.  Patient's last menstrual period was 01/22/2011.          Sexually active: no  The current method of family planning is tubal ligation.    Exercising: no  not regularly Smoker:  no  Health Maintenance: Pap:  12/29/10 WNL History of abnormal Pap:  yes MMG:  03/04/12-normal Colonoscopy:  2008-repeat in 7 years BMD:   2009-stable, heel test 2014-normal TDaP:  07/22/02 by our records.  Will check with PCP and if needed call back to schedule on a Friday Screening Labs: PCP, Hb today: PCP, Urine today: PH-7.0, RBC-trace   reports that she has never smoked. She has never used smokeless tobacco. She reports that she drinks alcohol. She reports that she does not use illicit drugs.  Past Medical History  Diagnosis Date  . Sleep apnea     CPAP  . Hypertension   . Obesity   . Bronchitis   . Hemorrhoids   . Chronic kidney disease     kidney stones  . Anxiety     xanax  . Shortness of breath     with exertion  . Diabetes mellitus     borderline - no meds  . Neuromuscular disorder     peripheral neuropathy-feet  .  Peripheral neuropathy   . Arthritis     shoulders/hips - tx with OTC meds  . Breast cyst 03/1990  . PMB (postmenopausal bleeding) 04/2000  . Endometrial polyp 04/2000  . Osteopenia 05/2007  . 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 Cervical tab 1972  . SVD (spontaneous vaginal delivery) 04/1964, 05/1978    x 2  . SAB (spontaneous abortion) 1981    Past Surgical History  Procedure Laterality Date  . Total knee arthroplasty Right     right  . Total knee arthroplasty Left     left -   . Cesarean section    . Wrist surgery Left     left  . Cervical disc surgery    . Dilation and curettage of uterus      , polyp removal  . Upper gastrointestinal endoscopy    . Colonoscopy  2007    2007  . Tubal ligation    . Cervical laminectomy  04/1991  . Hysteroscopy  01/2011    Current Outpatient Prescriptions  Medication Sig Dispense Refill  . acetaminophen (TYLENOL) 500 MG tablet Take 500 mg by mouth every 6 (six) hours as needed. For cramps.       Marland Kitchen  albuterol (PROVENTIL HFA;VENTOLIN HFA) 108 (90 BASE) MCG/ACT inhaler Inhale 1-2 puffs into the lungs every 6 (six) hours as needed for wheezing or shortness of breath.  1 Inhaler  0  . aspirin EC 81 MG tablet Take 81 mg by mouth daily.        Marland Kitchen azithromycin (ZITHROMAX) 250 MG tablet 2 today then one daily  6 each  0  . losartan-hydrochlorothiazide (HYZAAR) 100-25 MG per tablet Take 1 tablet by mouth daily.        . Multiple Vitamins-Minerals (MULTIVITAMIN WITH MINERALS) tablet Take 1 tablet by mouth daily.        . benzonatate (TESSALON) 200 MG capsule Take 1 capsule (200 mg total) by mouth 3 (three) times daily as needed for cough.  30 capsule  0  . meclizine (ANTIVERT) 25 MG tablet Take by mouth as needed. 1/2-1 tab three times a day for vertigo      . metFORMIN (GLUCOPHAGE-XR) 500 MG 24 hr tablet Take 500 mg by mouth daily.       No current facility-administered medications for this visit.    Family History  Problem Relation  Age of Onset  . Heart disease Father   . Colon cancer Mother   . Heart disease Mother   . Breast cancer Maternal Aunt   . Colon cancer Maternal Aunt   . Colon cancer Maternal Uncle   . Ehlers-Danlos syndrome Other     neice-being tested 4/15    ROS:  Pertinent items are noted in HPI.  Otherwise, a comprehensive ROS was negative.  Exam:   BP 130/78  Pulse 60  Resp 25  Ht 4' 11.5" (1.511 m)  Wt 254 lb 6.4 oz (115.395 kg)  BMI 50.54 kg/m2  LMP 01/22/2011  Weight change: -1lb   Height: 4' 11.5" (151.1 cm)  Ht Readings from Last 3 Encounters:  06/29/13 4' 11.5" (1.511 m)  06/27/13 4' 11.5" (1.511 m)  06/11/13 5' (1.524 m)    General appearance: alert, cooperative and appears stated age Head: Normocephalic, without obvious abnormality, atraumatic Neck: no adenopathy, supple, symmetrical, trachea midline and thyroid normal to inspection and palpation Lungs: clear to auscultation bilaterally Breasts: normal appearance, no masses or tenderness Heart: regular rate and rhythm Abdomen: soft, non-tender; bowel sounds normal; no masses,  no organomegaly Extremities: extremities normal, atraumatic, no cyanosis or edema Skin: Skin color, texture, turgor normal. No rashes or lesions Lymph nodes: Cervical, supraclavicular, and axillary nodes normal. No abnormal inguinal nodes palpated Neurologic: Grossly normal   Pelvic: External genitalia:  no lesions              Urethra:  normal appearing urethra with no masses, tenderness or lesions              Bartholins and Skenes: normal                 Vagina: normal appearing vagina with normal color and discharge, no lesions              Cervix: no lesions              Pap taken: yes Bimanual Exam:  Uterus:  normal size, contour, position, consistency, mobility, non-tender              Adnexa: normal adnexa and no mass, fullness, tenderness               Rectovaginal: Confirms  Anus:  normal sphincter tone, no lesions  A:   Well Woman with normal exam PMP bleeding?   Borderline DM, non complaint with medication Morbid obesity  P:   Mammogram scheduled for pt 4/17 at 9:30am. pap smear obtained Pt know if has ANY additional bleeding, she is to call.  Would do cath u/a and endometrial biopsy then.  return annually or prn  An After Visit Summary was printed and given to the patient.

## 2013-07-04 LAB — IPS PAP SMEAR ONLY

## 2013-07-07 ENCOUNTER — Ambulatory Visit (HOSPITAL_COMMUNITY)
Admission: RE | Admit: 2013-07-07 | Discharge: 2013-07-07 | Disposition: A | Discharge status: Home / Self Care | Payer: 59 | Source: Ambulatory Visit | Attending: Obstetrics & Gynecology | Admitting: Obstetrics & Gynecology

## 2013-07-07 ENCOUNTER — Other Ambulatory Visit: Payer: Self-pay | Admitting: Obstetrics & Gynecology

## 2013-07-07 ENCOUNTER — Ambulatory Visit (HOSPITAL_COMMUNITY): Payer: 59

## 2013-07-07 DIAGNOSIS — Z1239 Encounter for other screening for malignant neoplasm of breast: Secondary | ICD-10-CM

## 2013-07-07 DIAGNOSIS — Z1231 Encounter for screening mammogram for malignant neoplasm of breast: Secondary | ICD-10-CM | POA: Insufficient documentation

## 2013-07-25 ENCOUNTER — Encounter: Payer: Self-pay | Admitting: Internal Medicine

## 2013-07-25 NOTE — Assessment & Plan Note (Signed)
Increased cough since flu syndrome this winter.  Plan- neb xop, depo 80, Z pak

## 2013-07-25 NOTE — Assessment & Plan Note (Signed)
Good compliance and control 

## 2013-09-18 ENCOUNTER — Encounter: Payer: Self-pay | Admitting: Internal Medicine

## 2013-09-21 ENCOUNTER — Ambulatory Visit (INDEPENDENT_AMBULATORY_CARE_PROVIDER_SITE_OTHER): Payer: 59 | Admitting: Nurse Practitioner

## 2013-09-21 ENCOUNTER — Encounter (INDEPENDENT_AMBULATORY_CARE_PROVIDER_SITE_OTHER): Payer: Self-pay

## 2013-09-21 ENCOUNTER — Ambulatory Visit: Payer: 59 | Admitting: Internal Medicine

## 2013-09-21 ENCOUNTER — Encounter: Payer: Self-pay | Admitting: Nurse Practitioner

## 2013-09-21 VITALS — BP 153/77 | HR 74 | Ht 59.5 in | Wt 255.8 lb

## 2013-09-21 DIAGNOSIS — R4701 Aphasia: Secondary | ICD-10-CM

## 2013-09-21 DIAGNOSIS — I10 Essential (primary) hypertension: Secondary | ICD-10-CM

## 2013-09-21 DIAGNOSIS — G4733 Obstructive sleep apnea (adult) (pediatric): Secondary | ICD-10-CM

## 2013-09-21 NOTE — Patient Instructions (Signed)
Continue aspirin .81 mg enteric-coated Carotid Doppler negative,  2-D echo negative MRI of the brain with chronic small vessel disease nothing acute F/U in 6 to 8 months

## 2013-09-21 NOTE — Progress Notes (Signed)
[facility]  PATIENT: Nicole Cabrera DOB: 20-Jan-1946   REASON FOR VISIT: follow up for episodes of aphasia   HISTORY OF PRESENT ILLNESS: Nicole Cabrera is a 68 years old female, was referred by emergency room, and her primary care physician Dr. Matthias Hughs for evaluation of word finding difficulty. She was initially evaluated the Dr. Krista Blue 02/27/2013.  She had past medical history of obesity, hypertension, diabetes, obstructive sleep apnea, bilateral replacement baseline mild gait difficulty, sedentary lifestyle.  In December 14, 2012, while having lunch with her husband, she had acute onset of word finding difficulties, lasting few minutes, there was no lateralized motor or sensory deficit, she went home afterwards, one hour later, she developed holoacranial pounding headache, which improved after Tylenol, the same night, she woke up at 3 AM, complaining of severe holoacranial headaches, more so at the left retro-orbital region, she was taken to the emergency room, her headache went away with morphine treatment, CAT scan of the brain was essentially normal, laboratory evaluation was normal including CMP, CBC, she denies a previous history of headaches,  She has baseline gait difficulty, because of her weight, knee pain,  She had similar episode about 3 years ago, while driving, she suddenly had word finding difficulties, could not think of the name of the town [place], lasting for 5 minutes, was taken to the emergency room as well, CAT scan of the brain was normal,  She is taking daily baby aspirin . UPDATE: 09/21/13: Patient returns for followup. She has not had further episodes of aphasia. She claims she is taking her aspirin 2 times a week. 2D ECHO normal, carotid Doppler was normal without stenosis and MRI of the brain with chronic small vessel disease but nothing acute. She also has history of obstructive sleep apnea and uses CPAP nightly at 11 cm. Blood pressure is mildly elevated  today at 153/77. She claims she took her blood pressure medicine this morning. Due to her knee problems she gets no exercise. She is ambulating with a single-point cane. She has not fallen. She returns for reevaluation  REVIEW OF SYSTEMS: Full 14 system review of systems performed and notable only for those listed, all others are neg:  Constitutional: N/A  Cardiovascular: N/A  Ear/Nose/Throat: N/A  Skin: N/A  Eyes: N/A  Respiratory: N/A  Gastroitestinal: N/A  Hematology/Lymphatic: N/A  Endocrine: N/A Musculoskeletal:N/A  Allergy/Immunology: N/A  Neurological: N/A Psychiatric: N/A Sleep : Obstructive sleep apnea  ALLERGIES: Allergies  Allergen Reactions  . Bactrim [Sulfamethoxazole-Tmp Ds]   . Ciprofloxacin     GI upset  . Keflet [Cephalexin]     GI upset  . Lisinopril Cough  . Spironolactone Nausea Only  . Vistaril [Hydroxyzine Hcl] Other (See Comments)    Pt becomes physchotic.  Marland Kitchen Erythromycin Rash  . Latex Rash  . Penicillins Rash  . Tetracycline Rash    HOME MEDICATIONS: Outpatient Prescriptions Prior to Visit  Medication Sig Dispense Refill  . aspirin EC 81 MG tablet Take 81 mg by mouth daily.        Marland Kitchen losartan-hydrochlorothiazide (HYZAAR) 100-25 MG per tablet Take 1 tablet by mouth daily.        . Multiple Vitamins-Minerals (MULTIVITAMIN WITH MINERALS) tablet Take 1 tablet by mouth daily.        Marland Kitchen albuterol (PROVENTIL HFA;VENTOLIN HFA) 108 (90 BASE) MCG/ACT inhaler Inhale 1-2 puffs into the lungs every 6 (six) hours as needed for wheezing or shortness of breath.  1 Inhaler  0  . metFORMIN (  GLUCOPHAGE-XR) 500 MG 24 hr tablet Take 500 mg by mouth daily.      Marland Kitchen acetaminophen (TYLENOL) 500 MG tablet Take 500 mg by mouth every 6 (six) hours as needed. For cramps.       Marland Kitchen azithromycin (ZITHROMAX) 250 MG tablet 2 today then one daily  6 each  0  . benzonatate (TESSALON) 200 MG capsule Take 1 capsule (200 mg total) by mouth 3 (three) times daily as needed for cough.  30  capsule  0  . meclizine (ANTIVERT) 25 MG tablet Take by mouth as needed. 1/2-1 tab three times a day for vertigo       No facility-administered medications prior to visit.    PAST MEDICAL HISTORY: Past Medical History  Diagnosis Date  . Sleep apnea     CPAP  . Hypertension   . Obesity   . Bronchitis   . Hemorrhoids   . Chronic kidney disease     kidney stones  . Anxiety     xanax  . Shortness of breath     with exertion  . Diabetes mellitus     borderline - no meds  . Peripheral neuropathy   . Arthritis     shoulders/hips - tx with OTC meds  . Breast cyst 03/1990  . PMB (postmenopausal bleeding) 04/2000  . Endometrial polyp 04/2000  . Osteopenia 05/2007  . 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 Cervical tab 1972  . SVD (spontaneous vaginal delivery) 04/1964, 05/1978    x 2  . SAB (spontaneous abortion) 1981    PAST SURGICAL HISTORY: Past Surgical History  Procedure Laterality Date  . 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 Total knee arthroplasty Left 4/10    left   . Cesarean section  6/83    with BTL  . Wrist surgery Left 4/07    left  . Cervical disc surgery  2/93  . Hysteroscopy  5/02, 01/2011    FAMILY HISTORY: Family History  Problem Relation Age of Onset  . Heart disease Father   . Colon cancer Mother   . Heart disease Mother   . Breast cancer Maternal Aunt   . Colon cancer Maternal Aunt   . Colon cancer Maternal Uncle   . Ehlers-Danlos syndrome Other     neice-being tested 4/15    SOCIAL HISTORY: History   Social History  . Marital Status: Married    Spouse Name: [name]    Number of Children: 3  . Years of Education: college   Occupational History  .      Home maker   Social History Main Topics  . Smoking status: Never Smoker   . Smokeless tobacco: Never Used  . Alcohol Use: Yes     Comment: glass of wine-once a month  . Drug Use: No  . Sexual Activity: No     Comment: BTL   Other Topics Concern  . Not on  file   Social History Narrative   Patient is married [name])   Patient has three children.   Patient is a homemaker.   Education- College   Right handed.   Caffeine- one coke cola            PHYSICAL EXAM  Filed Vitals:   09/21/13 0951  BP: 153/77  Pulse: 74  Height: 4' 11.5" (1.511 m)  Weight: 255 lb 12.8 oz (116.03 kg)   Body mass index is 50.82 kg/(m^2). Generalized: In no acute distress, morbidly obese  female  Neck: Supple, no carotid bruits  Cardiac: Regular rate rhythm  Pulmonary: Clear to auscultation bilaterally  Musculoskeletal: No deformity  Neurological examination  Mentation: Alert oriented to time, place, history taking, and causual conversation  Cranial nerve II-XII: Pupils were equal round reactive to light extraocular movements were full, Visual field were full on confrontational test. Bilateral fundi were sharp. Facial sensation and strength were normal. Hearing was intact to finger rubbing bilaterally. Uvula tongue midline. head turning and shoulder shrug and were normal and symmetric.Tongue protrusion into cheek strength was normal.  Motor: normal tone, bulk and strength. No focal weakness Sensory: Intact to fine touch, pinprick, preserved vibratory sensation, and proprioception at toes.  Coordination: Normal finger to nose, heel-to-shin bilaterally there was no truncal ataxia  Gait: Rising up from seated position without assistance, normal stance, without trunk ataxia, moderate stride, good arm swing, smooth turning, able to perform tiptoe, and heel walking without difficulty.  Deep tendon reflexes: Brachioradialis 2/2, biceps 2/2, triceps 2/2, patellar 2/2, Achilles 2/2, plantar responses were flexor bilaterally.   DIAGNOSTIC DATA (LABS, IMAGING, TESTING) - I reviewed patient records, labs, notes, testing and imaging myself where available.  Lab Results  Component Value Date   WBC 10.3 12/14/2012   HGB 14.3 12/14/2012   HCT 43.8 12/14/2012   MCV 86.1  12/14/2012   PLT 265 12/14/2012      Component Value Date/Time   NA 138 12/14/2012 0434   K 3.3* 12/14/2012 0434   CL 102 12/14/2012 0434   CO2 29 12/14/2012 0434   GLUCOSE 139* 12/14/2012 0434   BUN 15 12/14/2012 0434   CREATININE 0.65 12/14/2012 0434   CALCIUM 9.0 12/14/2012 0434   PROT 6.5 07/03/2008 0930   ALBUMIN 3.3* 07/03/2008 0930   AST 21 07/03/2008 0930   ALT 25 07/03/2008 0930   ALKPHOS 75 07/03/2008 0930   BILITOT 0.5 07/03/2008 0930   GFRNONAA 89* 12/14/2012 0434   GFRAA >90 12/14/2012 0434    ASSESSMENT AND PLAN  68 y.o. year old female  has a past medical history of 2 episodes of word finding difficulties with vascular risk factors of obesity hypertension obstructive sleep apnea. MRI of the brain small vessel disease nothing acute, carotid Doppler was negative for stenosis, 2-D echo was normal.  Continue aspirin .81 mg enteric-coated Patient was given a copy of her studies Important to keep blood pressure systolic less than 846 follow up with Dr. Scotty Court re this Use CPAP every night for treatment of obstructive sleep apnea F/U in 6 to 8 months Dennie Bible, [facility], [facility], [facility] [address] [place],  [address] [phone]

## 2013-12-05 ENCOUNTER — Ambulatory Visit (INDEPENDENT_AMBULATORY_CARE_PROVIDER_SITE_OTHER): Payer: 59 | Admitting: Internal Medicine

## 2013-12-05 ENCOUNTER — Encounter: Payer: Self-pay | Admitting: Internal Medicine

## 2013-12-05 VITALS — BP 160/70 | HR 80 | Ht 59.0 in | Wt 253.4 lb

## 2013-12-05 DIAGNOSIS — K219 Gastro-esophageal reflux disease without esophagitis: Secondary | ICD-10-CM

## 2013-12-05 DIAGNOSIS — Z800 Family history of malignant neoplasm of digestive organs: Secondary | ICD-10-CM

## 2013-12-05 MED ORDER — MOVIPREP 100 G PO SOLR: 1.0000 | Freq: Once | ORAL | Status: DC

## 2013-12-05 NOTE — Progress Notes (Signed)
Nicole Cabrera [birth year] [id number]  Note: This dictation was prepared with Dragon digital system. Any transcriptional errors that result from this procedure are unintentional.   History of Present Illness:  This is a 68 year old white female who is here to discuss  screening colonoscopy. She has a positive family history of colon cancer in her mother. Her last colonoscopy was in October 2007 and prior to that in 2000 and 1994. She has regular bowel habits. She denies rectal bleeding. She does not take laxatives. She has been followed by Dr. Annamaria Boots for obstructive sleep apnea and bronchitis. She underwent a total knee replacement in April 2015 by Dr Elmyra Ricks and developed postoperative heartburn and reflux. She took 2 weeks of Pepcid and Prilosec with complete resolution of symptoms. She currently denies dysphagia, heartburn or indigestion. She is severely overweight but has been unable to lose weight.   Past Medical History  Diagnosis Date  . Sleep apnea     CPAP  . Hypertension   . Obesity   . Bronchitis   . Hemorrhoids   . Kidney stones   . Anxiety     xanax  . Shortness of breath     with exertion  . Diabetes mellitus     borderline - no meds  . Peripheral neuropathy   . Arthritis     shoulders/hips - tx with OTC meds  . Breast cyst 03/1990    pt denies 12/05/13  . PMB (postmenopausal bleeding) 04/2000  . Endometrial polyp 04/2000  . Osteopenia 05/2007  . 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 Cervical tab 1972  . SVD (spontaneous vaginal delivery) 04/1964, 05/1978    x3  . SAB (spontaneous abortion) 1981    Past Surgical History  Procedure Laterality Date  . 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 Total knee arthroplasty Left 4/10    left   . Cesarean section  6/83    with BTL  . Wrist surgery Left 4/07    left  . Cervical disc surgery  2/93  . Hysteroscopy  5/02, 01/2011    Allergies  Allergen Reactions  . Bactrim [Sulfamethoxazole-Tmp Ds]    . Ciprofloxacin     GI upset  . Keflet [Cephalexin]     GI upset  . Lisinopril Cough  . Spironolactone Nausea Only  . Vistaril [Hydroxyzine Hcl] Other (See Comments)    Pt becomes physchotic.  Marland Kitchen Erythromycin Rash  . Latex Rash  . Penicillins Rash  . Tetracycline Rash    Family history and social history have been reviewed.  Review of Systems: Negative for dysphagia. Positive for gradual weight gain  The remainder of the 10 point ROS is negative except as outlined in the H&P  Physical Exam: General Appearance Well developed, in no distress, massively obese Eyes  Non icteric  HEENT  Non traumatic, normocephalic  Mouth No lesion, tongue papillated, no cheilosis Neck Supple without adenopathy, thyroid not enlarged, no carotid bruits, no JVD Lungs Clear to auscultation bilaterally COR Normal S1, normal S2, regular rhythm, no murmur, quiet precordium Abdomen obese soft minimally tender in epigastrium. Normal left and right lower quadrant. No fluid wave Rectal normal rectal sphincter tone. Small amount of Hemoccult negative stool Extremities  No pedal edema Skin No lesions Neurological Alert and oriented x 3 Psychological Normal mood and affect  Assessment and Plan:   Problem #88 68 year old white female with a positive family history of colon cancer in her mother. She is overdue for a  screening colonoscopy. Her last exam was in 2007. She is Hemoccult negative on my exam today. We will schedule a colonoscopy at [facility] since her BMI is > 50.Marland Kitchen She will continue aspirin. We have discussed the prep and sedation.  Problem #2 Gastroesophageal reflux. Patient is currently asymptomatic. I have asked her if she wants to have upper endoscopy at the time of colonoscopy but she will think about it. In the meantime, she has Pepcid at home that she could take when necessary. We have discussed weight loss as a modality to decrease her reflux.    Delfin Edis 12/05/2013

## 2013-12-05 NOTE — Patient Instructions (Addendum)
You have been scheduled for a colonoscopy. Please follow written instructions given to you at your visit today.  Please pick up your prep kit at the pharmacy within the next 1-3 days. If you use inhalers (even only as needed), please bring them with you on the day of your procedure. Your physician has requested that you go to www.startemmi.com and enter the access code given to you at your visit today. This web site gives a general overview about your procedure. However, you should still follow specific instructions given to you by our office regarding your preparation for the procedure.  CC: Dr Matthias Hughs

## 2013-12-12 ENCOUNTER — Encounter: Payer: Self-pay | Admitting: Internal Medicine

## 2013-12-13 ENCOUNTER — Encounter: Payer: Self-pay | Admitting: Internal Medicine

## 2013-12-29 ENCOUNTER — Ambulatory Visit: Payer: 59 | Admitting: Internal Medicine

## 2014-01-09 ENCOUNTER — Encounter: Payer: Self-pay | Admitting: Internal Medicine

## 2014-01-09 ENCOUNTER — Ambulatory Visit: Payer: 59 | Admitting: Internal Medicine

## 2014-01-09 ENCOUNTER — Ambulatory Visit (INDEPENDENT_AMBULATORY_CARE_PROVIDER_SITE_OTHER)
Admission: RE | Admit: 2014-01-09 | Discharge: 2014-01-09 | Disposition: A | Discharge status: Home / Self Care | Payer: 59 | Source: Ambulatory Visit | Attending: Internal Medicine | Admitting: Internal Medicine

## 2014-01-09 VITALS — BP 130/82 | HR 90 | Ht 59.5 in | Wt 254.8 lb

## 2014-01-09 DIAGNOSIS — J209 Acute bronchitis, unspecified: Secondary | ICD-10-CM

## 2014-01-09 MED ORDER — LEVALBUTEROL HCL 0.63 MG/3ML IN NEBU
0.6300 mg | INHALATION_SOLUTION | Freq: Once | RESPIRATORY_TRACT | Status: AC
  Administered 2014-01-09: 0.63 mg via RESPIRATORY_TRACT

## 2014-01-09 MED ORDER — PROMETHAZINE-CODEINE 6.25-10 MG/5ML PO SYRP: 5.0000 mL | ORAL_SOLUTION | Freq: Four times a day (QID) | ORAL | Status: DC | PRN

## 2014-01-09 MED ORDER — METHYLPREDNISOLONE ACETATE 80 MG/ML IJ SUSP
80.0000 mg | Freq: Once | INTRAMUSCULAR | Status: AC
  Administered 2014-01-09: 80 mg via INTRAMUSCULAR

## 2014-01-09 NOTE — Patient Instructions (Addendum)
Order CXR  Dx acute bronchitis  Neb xop   Depo 80  Don't forget that flu shot as soon as you start feeling better  Instruct use of Proair rescue inhaler   2 puffs, every 4 hours if needed  Script for codeine cough syrup

## 2014-01-09 NOTE — Progress Notes (Signed)
02/27/11- 65 yoF never smoker, followed for obstructive sleep apnea complicated by HBP, history of bronchitis. LOV- 02/28/2010. At last visit we had recommended change from lisinopril because of cough. She was changed to a ARB and her cough resolved. She declines flu vaccine. She reports that she is quite comfortable with CPAP at 11 CWP/Advanced, with good compliance and control. She is using CPAP all night every night and she denies daytime sleepiness.  02/26/12- 65 yoF never smoker, followed for obstructive sleep apnea complicated by HBP, history of bronchitis. FOLLOWS FOR: wears CPAP every night for about 10 hours; new supplies on 03-05-12; pressure working well for patient.  She declines flu vaccine Continues CPAP 11/Advanced all night every night using a nasal mask. She does not need and feels well controlled.  03/17/13- 67 yoF never smoker, followed for obstructive sleep apnea complicated by HBP, history of bronchitis Follows for:  Pt wears CPAP nightly for roughly 8 hours.  Pt has no complaints with her cpap machine, but wishes to change dme providers due to the fact that [facility] is difficult to get in touch with and has given her trouble in the past.    04/07/13-  67 yoF never smoker, followed for obstructive sleep apnea complicated by HBP, history of bronchitis   Husband here Pt states she still has a cold-coughing so hard that she gets fatigued and unable to eat-gets choked. Chest pressure/tightness, wheezing at times. Was unable to eat lunch today due to cough(had salad with vinger dressing). Increased phelgm(stuck feeling) in throat. Spasms of cough when eating. CPAP 11/Advanced  06/27/13- 68 yoF never smoker, followed for obstructive sleep apnea complicated by HBP, history of  Bronchitis CPAP 11/ [facility] doing well. FOLLOWS FOR:had ? flu 1-2 mths ago,has had cough since then-clear,sob with exertion,occass. wheezing,denies cp or tightness,sweats with coughing,,wearing CPAP 8-9  hrs.each nigh,pr. good Residual cough since flu.  CXR 06/11/13 IMPRESSION:  Bibasilar atelectasis.  Electronically Signed  By: Maryclare Bean M.D.  On: 06/11/2013 11:52  01/09/14- 68 yoF never smoker, followed for obstructive sleep apnea complicated by HBP, history of  Bronchitis ACUTE: on 12/11/13 patient had flu-like symptoms. She saw her pcp who gave z-pak on 12/14/13. She had no relief afterwards then on 12/25/13 she was given Cefprozil 500 mg.  She has been taking mucinex fast max,  No chest tightness, some wheezing, clear phlem. CPAP 11/ [place] doing well     ROS-see HPI Constitutional:   No-   weight loss, night sweats, fevers, chills, +fatigue, lassitude. HEENT:   No-  headaches, difficulty swallowing, tooth/dental problems, sore throat,       No-  sneezing, itching, ear ache, nasal congestion, post nasal drip,  CV:  No-   chest pain, orthopnea, PND, swelling in lower extremities, anasarca, dizziness, palpitations Resp: +shortness of breath with exertion or at rest.              No-   productive cough,  + non-productive cough,  No- coughing up of blood.              No-   change in color of mucus.  No- wheezing.   Skin: No-   rash or lesions. GI:  No-   heartburn, indigestion, abdominal pain, nausea, vomiting,  GU: . MS:  No-   joint pain or swelling.  Neuro-     nothing unusual Psych:  No- change in mood or affect. No depression or anxiety.  No memory loss.   OBJ General- Alert,  Oriented, Affect-appropriate, Distress- none acute; quite obese Skin- rash-none, lesions- none, excoriation- none Lymphadenopathy- none Head- atraumatic            Eyes- Gross vision intact, PERRLA, conjunctivae clear secretions            Ears- Hearing, canals-normal            Nose- Clear, no-Septal dev, mucus, polyps, erosion, perforation             Throat- Mallampati III , mucosa clear/ not red , drainage- none, tonsils- atrophic Neck- flexible , trachea midline, no stridor , thyroid nl,  carotid no bruit Chest - symmetrical excursion , unlabored           Heart/CV- RRR , no murmur , no gallop  , no rub, nl s1 s2                           - JVD- none , edema- none, stasis changes- none, varices- none           Lung- clear to P&A, wheeze- none, cough+raspy , dullness-none, rub- none           Chest wall-  Abd-  Br/ Gen/ Rectal- Not done, not indicated Extrem- cyanosis- none, clubbing, none, atrophy- none, strength- nl, cane Neuro- grossly intact to observation

## 2014-01-10 ENCOUNTER — Telehealth: Payer: Self-pay | Admitting: Internal Medicine

## 2014-01-10 ENCOUNTER — Encounter: Payer: 59 | Admitting: Internal Medicine

## 2014-01-10 NOTE — Telephone Encounter (Signed)
Patient called and cancelled her procedure. States she does not want to go through with it right now. She states she will call back to reschedule. Called [facility] endo and cancelled procedure on 02/09/14.

## 2014-01-10 NOTE — Telephone Encounter (Signed)
Please remove pt from the schedule

## 2014-01-22 ENCOUNTER — Encounter: Payer: Self-pay | Admitting: Internal Medicine

## 2014-02-09 ENCOUNTER — Ambulatory Visit (HOSPITAL_COMMUNITY): Admission: RE | Admit: 2014-02-09 | Payer: 59 | Source: Ambulatory Visit | Admitting: Internal Medicine

## 2014-02-09 ENCOUNTER — Encounter (HOSPITAL_COMMUNITY): Admission: RE | Payer: Self-pay | Source: Ambulatory Visit

## 2014-02-09 SURGERY — COLONOSCOPY WITH PROPOFOL
Anesthesia: Monitor Anesthesia Care

## 2014-02-23 ENCOUNTER — Ambulatory Visit (INDEPENDENT_AMBULATORY_CARE_PROVIDER_SITE_OTHER): Payer: 59 | Admitting: Internal Medicine

## 2014-02-23 ENCOUNTER — Ambulatory Visit: Payer: 59 | Admitting: Internal Medicine

## 2014-02-23 ENCOUNTER — Encounter: Payer: Self-pay | Admitting: Internal Medicine

## 2014-02-23 VITALS — BP 138/82 | HR 68 | Ht 59.5 in | Wt 253.6 lb

## 2014-02-23 DIAGNOSIS — J45909 Unspecified asthma, uncomplicated: Secondary | ICD-10-CM | POA: Insufficient documentation

## 2014-02-23 DIAGNOSIS — J209 Acute bronchitis, unspecified: Secondary | ICD-10-CM

## 2014-02-23 DIAGNOSIS — G4733 Obstructive sleep apnea (adult) (pediatric): Secondary | ICD-10-CM

## 2014-02-23 DIAGNOSIS — J4520 Mild intermittent asthma, uncomplicated: Secondary | ICD-10-CM

## 2014-02-23 NOTE — Assessment & Plan Note (Signed)
Acute episode resolved. We discussed interaction of stimulant medications leading to tachypalpitation.  She does not need a maintenance inhaler.

## 2014-02-23 NOTE — Patient Instructions (Signed)
We can continue CPAP 11/ [place]- download and enroll in [employer]   Dx OSA  It sounds as if the rapid heart beat was due to the combination of the nebulizer treatment and the sudafed in your Mucinex-D that day. Either by itself would probably be ok  Please call if we can help

## 2014-02-23 NOTE — Assessment & Plan Note (Signed)
Good compliance and control. Pressure seems good at 11. Plan-download for compliance and pressure documentation

## 2014-02-23 NOTE — Progress Notes (Signed)
02/27/11- 65 yoF never smoker, followed for obstructive sleep apnea complicated by HBP, history of bronchitis. LOV- 02/28/2010. At last visit we had recommended change from lisinopril because of cough. She was changed to a ARB and her cough resolved. She declines flu vaccine. She reports that she is quite comfortable with CPAP at 11 CWP/Advanced, with good compliance and control. She is using CPAP all night every night and she denies daytime sleepiness.  02/26/12- 65 yoF never smoker, followed for obstructive sleep apnea complicated by HBP, history of bronchitis. FOLLOWS FOR: wears CPAP every night for about 10 hours; new supplies on 03-05-12; pressure working well for patient.  She declines flu vaccine Continues CPAP 11/Advanced all night every night using a nasal mask. She does not need and feels well controlled.  03/17/13- 67 yoF never smoker, followed for obstructive sleep apnea complicated by HBP, history of bronchitis Follows for:  Pt wears CPAP nightly for roughly 8 hours.  Pt has no complaints with her cpap machine, but wishes to change dme providers due to the fact that [facility] is difficult to get in touch with and has given her trouble in the past.    04/07/13-  67 yoF never smoker, followed for obstructive sleep apnea complicated by HBP, history of bronchitis   Husband here Pt states she still has a cold-coughing so hard that she gets fatigued and unable to eat-gets choked. Chest pressure/tightness, wheezing at times. Was unable to eat lunch today due to cough(had salad with vinger dressing). Increased phelgm(stuck feeling) in throat. Spasms of cough when eating. CPAP 11/Advanced  06/27/13- 68 yoF never smoker, followed for obstructive sleep apnea complicated by HBP, history of  Bronchitis CPAP 11/ [facility] doing well. FOLLOWS FOR:had ? flu 1-2 mths ago,has had cough since then-clear,sob with exertion,occass. wheezing,denies cp or tightness,sweats with coughing,,wearing CPAP 8-9  hrs.each nigh,pr. good Residual cough since flu.  CXR 06/11/13 IMPRESSION:  Bibasilar atelectasis.  Electronically Signed  By: Maryclare Bean M.D.  On: 06/11/2013 11:52  01/09/14- 68 yoF never smoker, followed for obstructive sleep apnea complicated by HBP, history of  Bronchitis ACUTE: on 12/11/13 patient had flu-like symptoms. She saw her pcp who gave z-pak on 12/14/13. She had no relief afterwards then on 12/25/13 she was given Cefprozil 500 mg.  She has been taking mucinex fast max,  No chest tightness, some wheezing, clear phlem. CPAP 11/ [facility] doing well   02/23/14- 68 yoF never smoker, followed for obstructive sleep apnea complicated by HBP, history of bronchitis FOLLOWS FOR: Slight cough now but not bad. Denies any wheezing, chest congestion, or SOB.  She declines flu shot CPAP 11/ [facility] doing well   Combination of Xopenex nebulizer treatment at last visit while also on Mucinex-D was associated with self-limited tachycardia palpitation. She has not been needing her rescue inhaler since last year and feels clear.  ROS-see HPI Constitutional:   No-   weight loss, night sweats, fevers, chills, +fatigue, lassitude. HEENT:   No-  headaches, difficulty swallowing, tooth/dental problems, sore throat,       No-  sneezing, itching, ear ache, nasal congestion, post nasal drip,  CV:  No-   chest pain, orthopnea, PND, swelling in lower extremities, anasarca, dizziness, palpitations Resp: +shortness of breath with exertion or at rest.              No-   productive cough,  + non-productive cough,  No- coughing up of blood.  No-   change in color of mucus.  No- wheezing.   Skin: No-   rash or lesions. GI:  No-   heartburn, indigestion, abdominal pain, nausea, vomiting,  GU: . MS:  No-   joint pain or swelling.  Neuro-     nothing unusual Psych:  No- change in mood or affect. No depression or anxiety.  No memory loss.   OBJ General- Alert, Oriented,  Affect-appropriate, Distress- none acute; + quite obese Skin- rash-none, lesions- none, excoriation- none Lymphadenopathy- none Head- atraumatic            Eyes- Gross vision intact, PERRLA, conjunctivae clear secretions            Ears- Hearing, canals-normal            Nose- Clear, no-Septal dev, mucus, polyps, erosion, perforation             Throat- Mallampati III , mucosa clear/ not red , drainage- none, tonsils- atrophic Neck- flexible , trachea midline, no stridor , thyroid nl, carotid no bruit Chest - symmetrical excursion , unlabored           Heart/CV- RRR , no murmur , no gallop  , no rub, nl s1 s2                           - JVD- none , edema- none, stasis changes- none, varices- none           Lung- clear to P&A, wheeze- none, cough+raspy , dullness-none, rub- none           Chest wall-  Abd-  Br/ Gen/ Rectal- Not done, not indicated Extrem- cyanosis- none, clubbing, none, atrophy- none, strength- nl, + cane Neuro- grossly intact to observation

## 2014-03-29 ENCOUNTER — Ambulatory Visit: Payer: 59 | Admitting: Nurse Practitioner

## 2014-07-09 ENCOUNTER — Encounter: Payer: Self-pay | Admitting: Obstetrics & Gynecology

## 2014-07-09 ENCOUNTER — Ambulatory Visit (INDEPENDENT_AMBULATORY_CARE_PROVIDER_SITE_OTHER): Payer: 59 | Admitting: Obstetrics & Gynecology

## 2014-07-09 VITALS — BP 160/86 | HR 60 | Resp 20 | Ht 59.25 in | Wt 254.2 lb

## 2014-07-09 DIAGNOSIS — N950 Postmenopausal bleeding: Secondary | ICD-10-CM

## 2014-07-09 DIAGNOSIS — Z0000 Encounter for general adult medical examination without abnormal findings: Secondary | ICD-10-CM

## 2014-07-09 DIAGNOSIS — Z01419 Encounter for gynecological examination (general) (routine) without abnormal findings: Secondary | ICD-10-CM

## 2014-07-09 DIAGNOSIS — N200 Calculus of kidney: Secondary | ICD-10-CM | POA: Diagnosis not present

## 2014-07-09 DIAGNOSIS — R312 Other microscopic hematuria: Secondary | ICD-10-CM | POA: Diagnosis not present

## 2014-07-09 DIAGNOSIS — R3129 Other microscopic hematuria: Secondary | ICD-10-CM

## 2014-07-09 LAB — POCT URINALYSIS DIPSTICK
Bilirubin, UA: NEGATIVE
Glucose, UA: NEGATIVE
Ketones, UA: NEGATIVE
Leukocytes, UA: NEGATIVE
Nitrite, UA: NEGATIVE
Urobilinogen, UA: NEGATIVE
pH, UA: 5

## 2014-07-09 NOTE — Progress Notes (Signed)
Pelvic ultrasound scheduled for 08-02-14 at 330 here in office. Patient declined earlier appointment. Wanted to wait until after appointment with [facility]. She states she has had this spotting for over a year and has a kidney stone. She is advised that this recommended to evalute endometrial lining and rule out abnormal cells as cause of bleeding. Again, declines earlier appointment.

## 2014-07-09 NOTE — Progress Notes (Signed)
69 y.o. [id number] MarriedCaucasianF here for annual exam.  Patient reports having some vaginal or urinary bleeding since last visit.  She did see Dr. Scotty Court.  Ultrasound of kidney was done.  Did have a stone on the left.  Has appt with [facility] in May.  The last episode of bleeding/spotting felt like she was going to have a period but she didn't have much bleeding.    Last labs were October.  HbA1C was 6.2.  Cholesterol was higher but still in normal range.    Patient's last menstrual period was 01/22/2011.          Sexually active: No.  The current method of family planning is tubal ligation.    Exercising: No.  not regularly Smoker:  no  Health Maintenance: Pap:  06/29/13 WNL History of abnormal Pap:  yes MMG:  07/07/13-normal Colonoscopy:  2008-Dr Brodie, aware is over due BMD:   2014-heel test-normal TDaP:  Will check with PCP Screening Labs: PCP, Hb today: PCP, Urine today: PROTEIN-trace, RBC-1+   reports that she has never smoked. She has never used smokeless tobacco. She reports that she drinks alcohol. She reports that she does not use illicit drugs.  Past Medical History  Diagnosis Date  . Sleep apnea     CPAP  . Hypertension   . Obesity   . Bronchitis   . Hemorrhoids   . Kidney stones   . Anxiety     xanax  . Shortness of breath     with exertion  . Diabetes mellitus     borderline - no meds  . Peripheral neuropathy   . Arthritis     shoulders/hips - tx with OTC meds  . Breast cyst 03/1990    pt denies 12/05/13  . PMB (postmenopausal bleeding) 04/2000  . Endometrial polyp 04/2000  . Osteopenia 05/2007  . 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 Cervical tab 1972  . SVD (spontaneous vaginal delivery) 04/1964, 05/1978    x3  . SAB (spontaneous abortion) 1981  . Renal stone     seen on [place] 1/16 or 2/16    Past Surgical History  Procedure Laterality Date  . 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 Total knee arthroplasty Left 4/10    left    . Cesarean section  6/83    with BTL  . Wrist surgery Left 4/07    left  . Cervical disc surgery  2/93  . Hysteroscopy  5/02, 01/2011    Current Outpatient Prescriptions  Medication Sig Dispense Refill  . albuterol (PROVENTIL HFA;VENTOLIN HFA) 108 (90 BASE) MCG/ACT inhaler Inhale 1-2 puffs into the lungs every 6 (six) hours as needed for wheezing or shortness of breath. 1 Inhaler 0  . ALPRAZolam (XANAX) 0.5 MG tablet Take 0.5 mg by mouth as needed.     Marland Kitchen aspirin EC 81 MG tablet Take 81 mg by mouth 2 (two) times a week.     . cholecalciferol (VITAMIN D) 1000 UNITS tablet Take 1,000 Units by mouth daily.    Marland Kitchen losartan-hydrochlorothiazide (HYZAAR) 100-25 MG per tablet Take 1 tablet by mouth daily.      . Multiple Vitamins-Minerals (MULTIVITAMIN WITH MINERALS) tablet Take 1 tablet by mouth daily.      . potassium chloride (MICRO-K) 10 MEQ CR capsule Take 10 mEq by mouth daily.     No current facility-administered medications for this visit.    Family History  Problem Relation Age of Onset  . Heart  disease Father   . Colon cancer Mother   . Heart disease Mother   . Breast cancer Maternal Aunt   . Colon cancer Maternal Aunt   . Colon cancer Maternal Uncle   . Ehlers-Danlos syndrome Other     neice-being tested 4/15    ROS:  Pertinent items are noted in HPI.  Otherwise, a comprehensive ROS was negative.  Exam:   BP 160/86 mmHg  Pulse 60  Resp 20  Ht 4' 11.25" (1.505 m)  Wt 254 lb 3.2 oz (115.304 kg)  BMI 50.91 kg/m2  LMP 01/22/2011  Weight change: stable  Height: 4' 11.25" (150.5 cm)  Ht Readings from Last 3 Encounters:  07/09/14 4' 11.25" (1.505 m)  02/23/14 4' 11.5" (1.511 m)  01/09/14 4' 11.5" (1.511 m)    General appearance: alert, cooperative and appears stated age Head: Normocephalic, without obvious abnormality, atraumatic Neck: no adenopathy, supple, symmetrical, trachea midline and thyroid normal to inspection and palpation Lungs: clear to auscultation  bilaterally Breasts: normal appearance, no masses or tenderness Heart: regular rate and rhythm Abdomen: soft, non-tender; bowel sounds normal; no masses,  no organomegaly Extremities: extremities normal, atraumatic, no cyanosis or edema Skin: Skin color, texture, turgor normal. No rashes or lesions Lymph nodes: Cervical, supraclavicular, and axillary nodes normal. No abnormal inguinal nodes palpated Neurologic: Grossly normal   Pelvic: External genitalia:  no lesions              Urethra:  normal appearing urethra with no masses, tenderness or lesions              Bartholins and Skenes: normal                 Vagina: normal appearing vagina with normal color and discharge, no lesions              Cervix: no lesions              Pap taken: No. Bimanual Exam:  Uterus:  normal size, contour, position, consistency, mobility, non-tender              Adnexa: normal adnexa and no mass, fullness, tenderness               Rectovaginal: Confirms               Anus:  normal sphincter tone, no lesions  Chaperone was present for exam.  A:  Well Woman with normal exam PMP bleeding?  Pt did not call last year as I advised if had additional bleeding Microscopic hematuria  Borderline DM, non complaint with medication Morbid obesity  P: Mammogram scheduled for pt 4/17 at 9:30am. pap smear 2015.  No Pap today. Pt declines endometrial biopsy today.  Will return for PUS and possible biopsy.  If endometrium is thickened at all, will proceed. Release signed for documentation about last Tdap.  My records indicate is overdue Urine micro and culture pending. D/W pt doing Zostavax and Pneumonia vaccine.  Has declines in past and with pulmonologist as well return annually or prn

## 2014-07-10 ENCOUNTER — Telehealth: Payer: Self-pay | Admitting: Obstetrics & Gynecology

## 2014-07-10 LAB — URINALYSIS, MICROSCOPIC ONLY
Bacteria, UA: NONE SEEN
Casts: NONE SEEN
Crystals: NONE SEEN

## 2014-07-10 NOTE — Telephone Encounter (Signed)
Call to patient. Advised of benefit quote received for PUS. Patient agreeable.

## 2014-07-11 LAB — URINE CULTURE
Colony Count: NO GROWTH
Organism ID, Bacteria: NO GROWTH

## 2014-07-15 ENCOUNTER — Emergency Department (HOSPITAL_COMMUNITY): Payer: 59

## 2014-07-15 ENCOUNTER — Emergency Department (HOSPITAL_COMMUNITY)
Admission: EM | Admit: 2014-07-15 | Discharge: 2014-07-16 | Disposition: A | Discharge status: Home / Self Care | Payer: 59 | Attending: Emergency Medicine | Admitting: Emergency Medicine

## 2014-07-15 ENCOUNTER — Encounter (HOSPITAL_COMMUNITY): Payer: Self-pay | Admitting: *Deleted

## 2014-07-15 DIAGNOSIS — Z9981 Dependence on supplemental oxygen: Secondary | ICD-10-CM | POA: Diagnosis not present

## 2014-07-15 DIAGNOSIS — Z8709 Personal history of other diseases of the respiratory system: Secondary | ICD-10-CM | POA: Diagnosis not present

## 2014-07-15 DIAGNOSIS — Z91040 Latex allergy status: Secondary | ICD-10-CM | POA: Diagnosis not present

## 2014-07-15 DIAGNOSIS — E669 Obesity, unspecified: Secondary | ICD-10-CM | POA: Insufficient documentation

## 2014-07-15 DIAGNOSIS — I10 Essential (primary) hypertension: Secondary | ICD-10-CM | POA: Insufficient documentation

## 2014-07-15 DIAGNOSIS — Z79899 Other long term (current) drug therapy: Secondary | ICD-10-CM | POA: Diagnosis not present

## 2014-07-15 DIAGNOSIS — R1011 Right upper quadrant pain: Secondary | ICD-10-CM | POA: Diagnosis present

## 2014-07-15 DIAGNOSIS — Z8719 Personal history of other diseases of the digestive system: Secondary | ICD-10-CM | POA: Diagnosis not present

## 2014-07-15 DIAGNOSIS — Z7982 Long term (current) use of aspirin: Secondary | ICD-10-CM | POA: Insufficient documentation

## 2014-07-15 DIAGNOSIS — Z87790 Personal history of (corrected) congenital malformations of face and neck: Secondary | ICD-10-CM | POA: Diagnosis not present

## 2014-07-15 DIAGNOSIS — Z880 Allergy status to penicillin: Secondary | ICD-10-CM | POA: Diagnosis not present

## 2014-07-15 DIAGNOSIS — Z87442 Personal history of urinary calculi: Secondary | ICD-10-CM | POA: Insufficient documentation

## 2014-07-15 DIAGNOSIS — M1990 Unspecified osteoarthritis, unspecified site: Secondary | ICD-10-CM | POA: Diagnosis not present

## 2014-07-15 DIAGNOSIS — R109 Unspecified abdominal pain: Secondary | ICD-10-CM

## 2014-07-15 DIAGNOSIS — G4730 Sleep apnea, unspecified: Secondary | ICD-10-CM | POA: Insufficient documentation

## 2014-07-15 DIAGNOSIS — F419 Anxiety disorder, unspecified: Secondary | ICD-10-CM | POA: Insufficient documentation

## 2014-07-15 DIAGNOSIS — R319 Hematuria, unspecified: Secondary | ICD-10-CM | POA: Diagnosis not present

## 2014-07-15 LAB — URINALYSIS, ROUTINE W REFLEX MICROSCOPIC
Bilirubin Urine: NEGATIVE
Glucose, UA: NEGATIVE mg/dL
Ketones, ur: NEGATIVE mg/dL
Leukocytes, UA: NEGATIVE
Nitrite: NEGATIVE
Protein, ur: NEGATIVE mg/dL
Specific Gravity, Urine: >1.03 — ABNORMAL HIGH (ref 1.005–1.030)
Urobilinogen, UA: 0.2 mg/dL (ref 0.0–1.0)
pH: 6 (ref 5.0–8.0)

## 2014-07-15 LAB — CBC WITH DIFFERENTIAL/PLATELET
Basophils Absolute: 0 10*3/uL (ref 0.0–0.1)
Basophils Relative: 0 % (ref 0–1)
Eosinophils Absolute: 0.3 10*3/uL (ref 0.0–0.7)
Eosinophils Relative: 2 % (ref 0–5)
HCT: 43 % (ref 36.0–46.0)
Hemoglobin: 13.7 g/dL (ref 12.0–15.0)
Lymphocytes Relative: 19 % (ref 12–46)
Lymphs Abs: 2.4 10*3/uL (ref 0.7–4.0)
MCH: 27.8 pg (ref 26.0–34.0)
MCHC: 31.9 g/dL (ref 30.0–36.0)
MCV: 87.4 fL (ref 78.0–100.0)
Monocytes Absolute: 0.9 10*3/uL (ref 0.1–1.0)
Monocytes Relative: 7 % (ref 3–12)
Neutro Abs: 9.1 10*3/uL — ABNORMAL HIGH (ref 1.7–7.7)
Neutrophils Relative %: 72 % (ref 43–77)
Platelets: 267 10*3/uL (ref 150–400)
RBC: 4.92 MIL/uL (ref 3.87–5.11)
RDW: 14.7 % (ref 11.5–15.5)
WBC: 12.7 10*3/uL — ABNORMAL HIGH (ref 4.0–10.5)

## 2014-07-15 LAB — COMPREHENSIVE METABOLIC PANEL
ALT: 17 U/L (ref 0–35)
AST: 21 U/L (ref 0–37)
Albumin: 3.5 g/dL (ref 3.5–5.2)
Alkaline Phosphatase: 77 U/L (ref 39–117)
Anion gap: 7 (ref 5–15)
BUN: 15 mg/dL (ref 6–23)
CO2: 29 mmol/L (ref 19–32)
Calcium: 8.9 mg/dL (ref 8.4–10.5)
Chloride: 103 mmol/L (ref 96–112)
Creatinine, Ser: 0.68 mg/dL (ref 0.50–1.10)
GFR calc Af Amer: >90 mL/min (ref >90)
GFR calc non Af Amer: 87 mL/min — ABNORMAL LOW (ref >90)
Glucose, Bld: 145 mg/dL — ABNORMAL HIGH (ref 70–99)
Potassium: 3.5 mmol/L (ref 3.5–5.1)
Sodium: 139 mmol/L (ref 135–145)
Total Bilirubin: 0.3 mg/dL (ref 0.3–1.2)
Total Protein: 6.4 g/dL (ref 6.0–8.3)

## 2014-07-15 LAB — URINE MICROSCOPIC-ADD ON

## 2014-07-15 MED ORDER — ACETAMINOPHEN 325 MG PO TABS
650.0000 mg | ORAL_TABLET | Freq: Once | ORAL | Status: AC
  Administered 2014-07-15: 650 mg via ORAL
  Filled 2014-07-15: qty 2

## 2014-07-15 NOTE — ED Notes (Signed)
Pt states about an hour ago, she had a sudden onset of ruq pain that radiated around to her back. Pt denies n/v.

## 2014-07-16 ENCOUNTER — Ambulatory Visit (HOSPITAL_COMMUNITY)
Admit: 2014-07-16 | Discharge: 2014-07-16 | Disposition: A | Discharge status: Home / Self Care | Payer: 59 | Source: Ambulatory Visit | Attending: Emergency Medicine | Admitting: Emergency Medicine

## 2014-07-16 DIAGNOSIS — R1011 Right upper quadrant pain: Secondary | ICD-10-CM | POA: Insufficient documentation

## 2014-07-16 MED ORDER — MORPHINE SULFATE 4 MG/ML IJ SOLN
4.0000 mg | Freq: Once | INTRAMUSCULAR | Status: AC
  Administered 2014-07-16: 4 mg via INTRAMUSCULAR
  Filled 2014-07-16: qty 1

## 2014-07-16 MED ORDER — NAPROXEN 250 MG PO TABS: ORAL_TABLET | ORAL | Status: DC

## 2014-07-16 MED ORDER — METHOCARBAMOL 500 MG PO TABS: ORAL_TABLET | ORAL | Status: DC

## 2014-07-16 MED ORDER — TRAMADOL HCL 50 MG PO TABS: 100.0000 mg | ORAL_TABLET | Freq: Four times a day (QID) | ORAL | Status: DC | PRN

## 2014-07-16 NOTE — ED Provider Notes (Signed)
CSN: [id number]     Arrival date & time 07/15/14  2115 History   First MD Initiated Contact with Patient 07/15/14 2139     Chief Complaint  Patient presents with  . Abdominal Pain     (Consider location/radiation/quality/duration/timing/severity/associated sxs/prior Treatment) HPI  69 year old female comes in tonight complaining of right mid back pain that radiates into her right flank. Began approximately an hour prior to evaluation. She denies nausea or vomiting. She states earlier this week she had a urine sample done by her gynecologist and was told that it had blood in it. She states that she has been told she's had a kidney stone on the left in the past. She has had multiple gallbladder ultrasounds have not shown any gallstones. She describes the pain as moderate and states that she will have Tylenol and does not want anything stronger at this time. Denies fever, chills, chest pain, dyspnea, urinary frequency, or headache.  Past Medical History  Diagnosis Date  . Sleep apnea     CPAP  . Hypertension   . Obesity   . Bronchitis   . Hemorrhoids   . Kidney stones   . Anxiety     xanax  . Shortness of breath     with exertion  . Diabetes mellitus     borderline - no meds  . Peripheral neuropathy   . Arthritis     shoulders/hips - tx with OTC meds  . Breast cyst 03/1990    pt denies 12/05/13  . PMB (postmenopausal bleeding) 04/2000  . Endometrial polyp 04/2000  . Osteopenia 05/2007  . 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 Cervical tab 1972  . SVD (spontaneous vaginal delivery) 04/1964, 05/1978    x3  . SAB (spontaneous abortion) 1981  . Renal stone     seen on [place] 1/16 or 2/16   Past Surgical History  Procedure Laterality Date  . 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 Total knee arthroplasty Left 4/10    left   . Cesarean section  6/83    with BTL  . Wrist surgery Left 4/07    left  . Cervical disc surgery  2/93  . Hysteroscopy  5/02, 01/2011    Family History  Problem Relation Age of Onset  . Heart disease Father   . Colon cancer Mother   . Heart disease Mother   . Breast cancer Maternal Aunt   . Colon cancer Maternal Aunt   . Colon cancer Maternal Uncle   . Ehlers-Danlos syndrome Other     neice-being tested 4/15   History  Substance Use Topics  . Smoking status: Never Smoker   . Smokeless tobacco: Never Used  . Alcohol Use: 0.0 oz/week    0 Standard drinks or equivalent per week     Comment: maybe once a month   OB History    Gravida Para Term Preterm AB TAB SAB Ectopic Multiple Living   5 3   2 1 1   3      Review of Systems  All other systems reviewed and are negative.     Allergies  Bactrim; Ciprofloxacin; Keflet; Lisinopril; Spironolactone; Vistaril; Adhesive; Erythromycin; Latex; Penicillins; and Tetracycline  Home Medications   Prior to Admission medications   Medication Sig Start Date End Date Taking? Authorizing Provider  acetaminophen (TYLENOL) 500 MG tablet Take 500 mg by mouth every 6 (six) hours as needed for moderate pain.   Yes Historical Provider, MD  ALPRAZolam Duanne Moron)  0.5 MG tablet Take 0.25 mg by mouth daily as needed for anxiety.  10/27/13  Yes Historical Provider, MD  cholecalciferol (VITAMIN D) 1000 UNITS tablet Take 1,000 Units by mouth daily.   Yes Historical Provider, MD  losartan-hydrochlorothiazide (HYZAAR) 100-25 MG per tablet Take 1 tablet by mouth daily.     Yes Historical Provider, MD  Multiple Vitamins-Minerals (MULTIVITAMIN WITH MINERALS) tablet Take 1 tablet by mouth daily.     Yes Historical Provider, MD  Polyethyl Glycol-Propyl Glycol (SYSTANE OP) Place 1-2 drops into both eyes daily.   Yes Historical Provider, MD  albuterol (PROVENTIL HFA;VENTOLIN HFA) 108 (90 BASE) MCG/ACT inhaler Inhale 1-2 puffs into the lungs every 6 (six) hours as needed for wheezing or shortness of breath. 06/11/13   Nat Christen, MD  aspirin EC 81 MG tablet Take 81 mg by mouth 2 (two) times a week.      Historical Provider, MD   BP 168/81 mmHg  Pulse 80  Temp(Src) 98.2 F (36.8 C) (Oral)  Resp 22  Ht 4' 11.5" (1.511 m)  Wt 248 lb (112.492 kg)  BMI 49.27 kg/m2  SpO2 94%  LMP 01/22/2011 Physical Exam  Constitutional: She is oriented to person, place, and time. She appears well-developed and well-nourished.  HENT:  Head: Normocephalic and atraumatic.  Right Ear: External ear normal.  Left Ear: External ear normal.  Nose: Nose normal.  Mouth/Throat: Oropharynx is clear and moist.  Eyes: Conjunctivae and EOM are normal. Pupils are equal, round, and reactive to light.  Neck: Normal range of motion. Neck supple.  Cardiovascular: Normal rate, regular rhythm, normal heart sounds and intact distal pulses.   Pulmonary/Chest: Effort normal and breath sounds normal.  Abdominal: Soft. Bowel sounds are normal.  Mild right upper quadrant tenderness abdomen is soft bowel sounds are present. No right lower quadrant tenderness to palpation.  Musculoskeletal: Normal range of motion.  Neurological: She is alert and oriented to person, place, and time. She has normal reflexes.  Skin: Skin is warm and dry.  Psychiatric: She has a normal mood and affect. Her behavior is normal. Judgment and thought content normal.  Nursing note and vitals reviewed.   ED Course  Procedures (including critical care time) Labs Review Labs Reviewed  URINALYSIS, ROUTINE W REFLEX MICROSCOPIC - Abnormal; Notable for the following:    APPearance HAZY (*)    Specific Gravity, Urine >1.030 (*)    Hgb urine dipstick LARGE (*)    All other components within normal limits  COMPREHENSIVE METABOLIC PANEL - Abnormal; Notable for the following:    Glucose, Bld 145 (*)    GFR calc non Af Amer 87 (*)    All other components within normal limits  CBC WITH DIFFERENTIAL/PLATELET - Abnormal; Notable for the following:    WBC 12.7 (*)    Neutro Abs 9.1 (*)    All other components within normal limits  URINE MICROSCOPIC-ADD ON -  Abnormal; Notable for the following:    Squamous Epithelial / LPF FEW (*)    Bacteria, UA FEW (*)    All other components within normal limits    Imaging Review Dg Ribs Unilateral W/chest Right  07/15/2014   CLINICAL DATA:  69 year old female with sudden onset right upper quadrant pain radiating to the back  EXAM: RIGHT RIBS AND CHEST - 3+ VIEW  COMPARISON:  Prior CT scan of the chest 07/12/08 ; prior chest x-Markavious Micco 01/09/2014  FINDINGS: Cardiac mediastinal contours are unchanged. No focal airspace consolidation, pleural effusion or pneumothorax.  No evidence of pulmonary edema. Stable chronic bronchitic change and bibasilar interstitial prominence. No evidence of acute fracture. Multilevel degenerative endplate spurring.  IMPRESSION: Negative.   Electronically Signed   By: Jacqulynn Cadet M.D.   On: 07/15/2014 23:14     EKG Interpretation None      MDM   Final diagnoses:  Right flank pain  Hematuria    69 year old female with right flank pain and hematuria. CT abdomen pending. Liver function tests are normal. Patient care discussed with Dr. Wynona Canes and she will follow-up on CT results.  Pattricia Boss, MD 07/18/14 [phone]

## 2014-07-16 NOTE — Discharge Instructions (Signed)
Try ice and heat to the painful areas. Take the medications as prescribed. You have an appointment at 11 am this morning to get your ultrasound done. Do not eat or drink after 3 am before your test. You will get the result by the Emergency Department doctor working tomorrow. Keep your appointment with Urology about the kidney stone on the left.   Return to the ED if you get a fever or have uncontrolled vomiting.

## 2014-07-16 NOTE — ED Provider Notes (Signed)
Patient was left at change of shift by Dr. Jeanell Sparrow to get results of her CT scan. We had talked to the patient about 11:30. She reports she has noted she has blood in her urine and has a left renal stone and actually has an appointment to follow-up with urology in the next week. However patient reports she is having pain in her right back without pain in her right upper quadrant to palpation. I noticed during her exam that when she moves certain ways she flinches. Patient is very concerned about what pain medication she can have because she has sleep apnea and she has a CPAP machine she uses at night. . She states she does not want anything strong because she is afraid of stopping her breathing.    01:10 patient was given the results of her CT scan. I'm going to arrange for outpatient ultrasound to be done hopefully later today.  Dg Ribs Unilateral W/chest Right  07/15/2014   CLINICAL DATA:  69 year old female with sudden onset right upper quadrant pain radiating to the back  EXAM: RIGHT RIBS AND CHEST - 3+ VIEW  COMPARISON:  Prior CT scan of the chest 07/12/08 ; prior chest x-ray 01/09/2014  FINDINGS: Cardiac mediastinal contours are unchanged. No focal airspace consolidation, pleural effusion or pneumothorax. No evidence of pulmonary edema. Stable chronic bronchitic change and bibasilar interstitial prominence. No evidence of acute fracture. Multilevel degenerative endplate spurring.  IMPRESSION: Negative.   Electronically Signed   By: Jacqulynn Cadet M.D.   On: 07/15/2014 23:14   Ct Renal Stone Study  07/16/2014   CLINICAL DATA:  69 year old female with sudden onset right upper quadrant pain radiating into the back. Medical history includes nephrolithiasis.  EXAM: CT ABDOMEN AND PELVIS WITHOUT CONTRAST  TECHNIQUE: Multidetector CT imaging of the abdomen and pelvis was performed following the standard protocol without IV contrast.  COMPARISON:  Prior abdominal ultrasound April 24, 2014  FINDINGS: Lower  Chest: Calcification of the mitral valve annulus. Heart remains within normal limits for size. Small hiatal hernia. 7 mm subpleural pulmonary nodule affiliated with the minor fissure (image 5 series 6). 4 mm pulmonary nodule affiliated with the right major fissure (image 3 series 6). Mild dependent atelectasis. No focal airspace consolidation or evidence of pleural effusion.  Abdomen: Unenhanced CT was performed per clinician order. Lack of IV contrast limits sensitivity and specificity, especially for evaluation of abdominal/pelvic solid viscera. Within these limitations, unremarkable CT appearance of the stomach, duodenum, spleen, adrenal glands and pancreas save for fatty atrophy of the head and uncinate process. Normal hepatic contour and morphology. No discrete hepatic lesion. Gallbladder is unremarkable. No intra or extrahepatic biliary ductal dilatation.  Punctate nonobstructing stones in the interpolar right kidney. Multiple left-sided renal stones including a 5 mm upper pole stone and an 11 mm stone in the renal pelvis. There is ill-defined density and haziness surrounding the left renal pelvis in the region of the stone. Evaluation is limited in the absence of intravenous contrast. Otherwise, no evidence of ureteral dilatation or stone.  Normal appendix in the right lower quadrant. No evidence of free fluid or suspicious adenopathy. Small fat containing periumbilical hernia.  Pelvis: Unremarkable appearance of the adnexa, uterus and decompressed bladder. No free fluid or suspicious adenopathy.  Bones/Soft Tissues: No acute fracture or aggressive appearing lytic or blastic osseous lesion. Multilevel degenerative disc disease with vacuum disc phenomenon.  Vascular: Limited evaluation in the absence of intravenous contrast.  IMPRESSION: 1. Left-sided 11 mm stone in the  renal pelvis with surrounding soft tissue density and hazy inflammatory stranding. Findings may represent either acute or chronic  infection/inflammation. A chronic process is favored. A urothelial malignancy cannot be excluded in the absence of intravenous contrast material but is considered less likely. 2. Additional bilateral nonobstructing nephrolithiasis larger on the left than the right. 3. Right lower lung nonspecific subpleural pulmonary nodules are favored to represent the sequelae of old infectious/inflammatory or granulomatous process. However, without prior imaging for confirmation, follow-up imaging is recommended. If the patient is at high risk for bronchogenic carcinoma, follow-up chest CT at 3-48months is recommended. If the patient is at low risk for bronchogenic carcinoma, follow-up chest CT at 6-12 months is recommended. This recommendation follows the consensus statement: Guidelines for Management of Small Pulmonary Nodules Detected on CT Scans: A Statement from the [place] as published in Radiology 2005; 237:395-400. 4. Small fat containing periumbilical hernia. 5. Multilevel degenerative disc disease. 6. Fatty atrophy of the pancreas.   Electronically Signed   By: Jacqulynn Cadet M.D.   On: 07/16/2014 00:43   Diagnoses that have been ruled out:  None  Diagnoses that are still under consideration:  None  Final diagnoses:  Right flank pain  Hematuria    New Prescriptions   METHOCARBAMOL (ROBAXIN) 500 MG TABLET    Take 1 or 2 po Q 6hrs for pain and muscle spasms   NAPROXEN (NAPROSYN) 250 MG TABLET    Take 1 po BID with food prn pain   TRAMADOL (ULTRAM) 50 MG TABLET    Take 2 tablets (100 mg total) by mouth every 6 (six) hours as needed.    Plan discharge  Rolland Porter, MD, Barbette Or, MD 07/16/14 [phone]

## 2014-07-27 ENCOUNTER — Other Ambulatory Visit: Payer: Self-pay | Admitting: Urology

## 2014-07-27 ENCOUNTER — Ambulatory Visit (INDEPENDENT_AMBULATORY_CARE_PROVIDER_SITE_OTHER): Payer: 59 | Admitting: Urology

## 2014-07-27 ENCOUNTER — Ambulatory Visit (HOSPITAL_COMMUNITY)
Admission: RE | Admit: 2014-07-27 | Discharge: 2014-07-27 | Disposition: A | Discharge status: Home / Self Care | Payer: 59 | Source: Ambulatory Visit | Attending: Urology | Admitting: Urology

## 2014-07-27 DIAGNOSIS — N200 Calculus of kidney: Secondary | ICD-10-CM

## 2014-07-27 DIAGNOSIS — I10 Essential (primary) hypertension: Secondary | ICD-10-CM | POA: Diagnosis not present

## 2014-07-27 DIAGNOSIS — R1011 Right upper quadrant pain: Secondary | ICD-10-CM | POA: Diagnosis present

## 2014-07-30 ENCOUNTER — Telehealth: Payer: Self-pay | Admitting: Obstetrics & Gynecology

## 2014-07-30 NOTE — Telephone Encounter (Signed)
Pt called to cancel ultrasound for Thursday. States she is dealing with kidney stones and will have procedure for them. Agreeable to call back for rescheduling.

## 2014-07-30 NOTE — Telephone Encounter (Addendum)
Patient was scheduled for PUS on 5/12 at 3:30pm with 4pm consult with Dr.Miller for further evaluation of endometrial lining with spotting. Patient is to have surgery for kidney stones and will need PUS rescheduled.  Left message to call [place] at [phone].

## 2014-08-02 ENCOUNTER — Other Ambulatory Visit: Payer: 59

## 2014-08-02 ENCOUNTER — Other Ambulatory Visit: Payer: 59 | Admitting: Obstetrics & Gynecology

## 2014-08-02 NOTE — Telephone Encounter (Signed)
Left message to call Kaitlyn at [phone]. 

## 2014-08-07 NOTE — Telephone Encounter (Signed)
Left message to call Kaitlyn at [phone]. 

## 2014-08-08 ENCOUNTER — Other Ambulatory Visit: Payer: Self-pay | Admitting: Urology

## 2014-08-09 ENCOUNTER — Other Ambulatory Visit: Payer: Self-pay | Admitting: Urology

## 2014-08-10 NOTE — Telephone Encounter (Signed)
Dr.Miller, I have attempted to reach this patient x3 with no return call. Okay to close encounter?

## 2014-08-13 ENCOUNTER — Other Ambulatory Visit: Payer: Self-pay | Admitting: Urology

## 2014-08-23 ENCOUNTER — Ambulatory Visit (HOSPITAL_COMMUNITY)
Admission: RE | Admit: 2014-08-23 | Discharge: 2014-08-23 | Disposition: A | Discharge status: Home / Self Care | Payer: 59 | Source: Ambulatory Visit | Attending: Urology | Admitting: Urology

## 2014-08-23 ENCOUNTER — Other Ambulatory Visit: Payer: Self-pay | Admitting: Urology

## 2014-08-23 DIAGNOSIS — R1031 Right lower quadrant pain: Secondary | ICD-10-CM | POA: Diagnosis present

## 2014-08-23 DIAGNOSIS — N200 Calculus of kidney: Secondary | ICD-10-CM | POA: Diagnosis not present

## 2014-08-24 ENCOUNTER — Ambulatory Visit (INDEPENDENT_AMBULATORY_CARE_PROVIDER_SITE_OTHER): Payer: 59 | Admitting: Urology

## 2014-08-24 DIAGNOSIS — R300 Dysuria: Secondary | ICD-10-CM

## 2014-08-24 DIAGNOSIS — N200 Calculus of kidney: Secondary | ICD-10-CM | POA: Diagnosis not present

## 2014-08-27 ENCOUNTER — Other Ambulatory Visit: Payer: Self-pay | Admitting: Urology

## 2014-08-28 ENCOUNTER — Encounter (HOSPITAL_COMMUNITY): Payer: Self-pay | Admitting: *Deleted

## 2014-08-28 NOTE — Progress Notes (Signed)
Patient's antibiotic that she is currently on is nitrofurantoin 100mg  / daily.  Probiotic is called Ultimate Flora. Patient aware of all pre litho instructions (see patient education section for details).

## 2014-08-28 NOTE — Progress Notes (Signed)
Called patient pre litho for interview. She states she is taking a probiotic prior to taking antibiotic she is currently on for a UTI. Kathryne Hitch, Freight forwarder at [employer], and she stated should be fine for patient to take probiotic prior to litho as long as it does NOT contain any aspirin/nsaids. Will confirm with patient and have her bring probiotic (and antibiotic as unsure of name) when here for litho on Thursday.

## 2014-08-29 NOTE — H&P (Signed)
Active Problems  1. Calculus of kidney (N20.0)  2. Dysuria (R30.0)  3. Hypertension (I10)  History of Present Illness  Nicole Cabrera returns today with a recent 3 day history of right flank pain followed buy the onset of urgency and frequency about 2 days ago with some terminal dysuria.   A KUB done yesterday showed no obvious right ureteral stones but some tiny right renal stones.  There is a possible left UVJ stone but it is difficult to say for sure since there are several phleboliths.   She has a stable 9 x 2mm left renal stone that is to be treated with ESWL on 6/9.  Her UA today looks infected.   Her symptoms have abated.   Past Medical History  1. History of arthritis (Z87.39)  2. History of esophageal reflux (Z87.19)  3. History of sleep apnea (Z87.09)  Surgical History  1. History of Arm Incision  2. History of Cesarean Section  3. History of Knee Replacement  4. History of Neck Surgery  Current Meds  1. Acetaminophen TABS;  Therapy: (Recorded:06May2016) to Recorded  2. Aspirin 81 MG TABS;  Therapy: (Recorded:06May2016) to Recorded  3. Centrum Silver Oral Tablet;  Therapy: (Recorded:06May2016) to Recorded  4. Losartan Potassium-HCTZ TABS;  Therapy: (Recorded:06May2016) to Recorded  5. Vitamin D 1000 UNIT Oral Tablet;  Therapy: (Recorded:06May2016) to Recorded  6. Xanax TABS (ALPRAZolam);  Therapy: (Recorded:06May2016) to Recorded  Allergies  1. tetracycline  2. erythromycin  3. Penicillins  4. tetracaine  5. Vistaril  Family History  1. Family history of Death : Mother, Father  2. Family history of colon cancer (Z80.0) : Aunt  3. Family history of hypertension (Z82.49) : Father  4. Family history of malignant neoplasm of breast (Z80.3) : Aunt  5. Family history of myocardial infarction (Z82.49) : Mother  Social History  1. Alcohol use (Z78.9)  2. Caffeine use (F15.90)  3. Never a smoker  4. Never used tobacco (Z78.9)  5. Number of children   1 son 2  daughters  13. Retired  Review of Systems  Genitourinary: urinary frequency, urinary urgency and dysuria.  Gastrointestinal: flank pain, but no nausea.  Constitutional: no fever.    Vitals Vital Signs [Data Includes: Last 1 Day]  Recorded: [id number] 09:14AM  Height: 4 ft 11 in Weight: 248 lb  BMI Calculated: 50.09 BSA Calculated: 2.02 Blood Pressure: 124 / 74 Temperature: 97.9 F Heart Rate: 81  Results/Data Urine [Data Includes: Last 1 Day]   [id number]  COLOR YELLOW   APPEARANCE CLEAR   SPECIFIC GRAVITY 1.015   pH 5.0   GLUCOSE NEG mg/dL  BILIRUBIN NEG   KETONE NEG mg/dL  BLOOD LARGE   PROTEIN 100 mg/dL  UROBILINOGEN 0.2 mg/dL  NITRITE POS   LEUKOCYTE ESTERASE MOD   SQUAMOUS EPITHELIAL/HPF RARE   WBC 7-10 WBC/hpf  RBC 11-20 RBC/hpf  BACTERIA MODERATE   CRYSTALS NONE SEEN   CASTS NONE SEEN   Other QNS TO SPIN    The following images/tracing/specimen were independently visualized:  KUB reviewed.  The following clinical lab reports were reviewed:  UA reviewed.    Assessment  1. Dysuria (R30.0)  2. Calculus of kidney (N20.0)      She doesn't have an obvious right ureteral stones but does appear to have a UTI.  Her left renal stone is stable but there is a questionable small LUVJ stone. Her BP is better today.   Plan Calculus of kidney   1. UA With REFLEX; [Do Not  Release]; Status:Hold For - Specimen/Data  Collection,Appointment; Requested EYC:[id number];  Dysuria   2. Start: Nitrofurantoin Monohyd Macro 100 MG Oral Capsule; TAKE 1 CAPSULE TWICE  DAILY  3. URINE CULTURE; Status:Hold For - Specimen/Data Collection,Appointment; Requested  JSH:[id number];    Urine culture. Macrobid pending the culture.   She has allergies to PCN, TCN, Cipro and Sulfa.  She will f/u for ESWL as planned.   Discussion/Summary   CC: Dr. Matthias Hughs.

## 2014-08-30 ENCOUNTER — Ambulatory Visit (HOSPITAL_COMMUNITY): Payer: 59

## 2014-08-30 ENCOUNTER — Ambulatory Visit (HOSPITAL_COMMUNITY)
Admission: RE | Admit: 2014-08-30 | Discharge: 2014-08-30 | Disposition: A | Discharge status: Home / Self Care | Payer: 59 | Source: Ambulatory Visit | Attending: Urology | Admitting: Urology

## 2014-08-30 ENCOUNTER — Encounter (HOSPITAL_COMMUNITY)
Admission: RE | Disposition: A | Discharge status: Home / Self Care | Payer: Self-pay | Source: Ambulatory Visit | Attending: Urology

## 2014-08-30 ENCOUNTER — Encounter (HOSPITAL_COMMUNITY): Payer: Self-pay | Admitting: *Deleted

## 2014-08-30 DIAGNOSIS — N390 Urinary tract infection, site not specified: Secondary | ICD-10-CM | POA: Diagnosis not present

## 2014-08-30 DIAGNOSIS — M1990 Unspecified osteoarthritis, unspecified site: Secondary | ICD-10-CM | POA: Insufficient documentation

## 2014-08-30 DIAGNOSIS — Z882 Allergy status to sulfonamides status: Secondary | ICD-10-CM | POA: Insufficient documentation

## 2014-08-30 DIAGNOSIS — Z79899 Other long term (current) drug therapy: Secondary | ICD-10-CM | POA: Insufficient documentation

## 2014-08-30 DIAGNOSIS — N200 Calculus of kidney: Secondary | ICD-10-CM | POA: Diagnosis present

## 2014-08-30 DIAGNOSIS — K219 Gastro-esophageal reflux disease without esophagitis: Secondary | ICD-10-CM | POA: Insufficient documentation

## 2014-08-30 DIAGNOSIS — G4730 Sleep apnea, unspecified: Secondary | ICD-10-CM | POA: Insufficient documentation

## 2014-08-30 DIAGNOSIS — Z880 Allergy status to penicillin: Secondary | ICD-10-CM | POA: Insufficient documentation

## 2014-08-30 DIAGNOSIS — Z7982 Long term (current) use of aspirin: Secondary | ICD-10-CM | POA: Diagnosis not present

## 2014-08-30 DIAGNOSIS — Z881 Allergy status to other antibiotic agents status: Secondary | ICD-10-CM | POA: Insufficient documentation

## 2014-08-30 HISTORY — DX: Type 2 diabetes mellitus without complications: E11.9

## 2014-08-30 HISTORY — PX: LITHOTRIPSY: SUR834

## 2014-08-30 LAB — CREATININE, SERUM
Creatinine, Ser: 0.56 mg/dL (ref 0.44–1.00)
GFR calc Af Amer: >60 mL/min (ref >60)
GFR calc non Af Amer: >60 mL/min (ref >60)

## 2014-08-30 SURGERY — LITHOTRIPSY, ESWL
Anesthesia: LOCAL | Laterality: Left

## 2014-08-30 MED ORDER — ACETAMINOPHEN 650 MG RE SUPP
650.0000 mg | RECTAL | Status: DC | PRN
  Filled 2014-08-30: qty 1

## 2014-08-30 MED ORDER — DIAZEPAM 5 MG PO TABS
10.0000 mg | ORAL_TABLET | ORAL | Status: AC
  Administered 2014-08-30: 10 mg via ORAL
  Filled 2014-08-30: qty 2

## 2014-08-30 MED ORDER — SODIUM CHLORIDE 0.9 % IV SOLN: 250.0000 mL | INTRAVENOUS | Status: DC | PRN

## 2014-08-30 MED ORDER — SODIUM CHLORIDE 0.9 % IJ SOLN: 3.0000 mL | Freq: Two times a day (BID) | INTRAMUSCULAR | Status: DC

## 2014-08-30 MED ORDER — OXYCODONE HCL 5 MG PO TABS: 5.0000 mg | ORAL_TABLET | ORAL | Status: DC | PRN

## 2014-08-30 MED ORDER — GENTAMICIN SULFATE 40 MG/ML IJ SOLN
5.0000 mg/kg | INTRAVENOUS | Status: AC
  Administered 2014-08-30: 360 mg via INTRAVENOUS
  Filled 2014-08-30: qty 9

## 2014-08-30 MED ORDER — OXYCODONE-ACETAMINOPHEN 5-325 MG PO TABS: 1.0000 | ORAL_TABLET | ORAL | Status: DC | PRN

## 2014-08-30 MED ORDER — ACETAMINOPHEN 325 MG PO TABS: 650.0000 mg | ORAL_TABLET | ORAL | Status: DC | PRN

## 2014-08-30 MED ORDER — SODIUM CHLORIDE 0.9 % IJ SOLN: 3.0000 mL | INTRAMUSCULAR | Status: DC | PRN

## 2014-08-30 MED ORDER — SODIUM CHLORIDE 0.9 % IV SOLN
INTRAVENOUS | Status: DC
  Administered 2014-08-30: 08:00:00 via INTRAVENOUS

## 2014-08-30 MED ORDER — FENTANYL CITRATE (PF) 100 MCG/2ML IJ SOLN: 25.0000 ug | INTRAMUSCULAR | Status: DC | PRN

## 2014-08-30 MED ORDER — DIPHENHYDRAMINE HCL 25 MG PO CAPS
25.0000 mg | ORAL_CAPSULE | ORAL | Status: AC
  Administered 2014-08-30: 25 mg via ORAL
  Filled 2014-08-30: qty 1

## 2014-08-30 NOTE — Discharge Instructions (Addendum)
Lithotripsy, Care After °Refer to this sheet in the next few weeks. These instructions provide you with information on caring for yourself after your procedure. Your health care provider may also give you more specific instructions. Your treatment has been planned according to current medical practices, but problems sometimes occur. Call your health care provider if you have any problems or questions after your procedure. °WHAT TO EXPECT AFTER THE PROCEDURE  °· Your urine may have a red tinge for a few days after treatment. Blood loss is usually minimal. °· You may have soreness in the back or flank area. This usually goes away after a few days. The procedure can cause blotches or bruises on the back where the pressure wave enters the skin. These marks usually cause only minimal discomfort and should disappear in a short time. °· Stone fragments should begin to pass within 24 hours of treatment. However, a delayed passage is not unusual. °· You may have pain, discomfort, and feel sick to your stomach (nauseated) when the crushed fragments of stone are passed down the tube from the kidney to the bladder. Stone fragments can pass soon after the procedure and may last for up to 4-8 weeks. °· A small number of patients may have severe pain when stone fragments are not able to pass, which leads to an obstruction. °· If your stone is greater than 1 inch (2.5 cm) in diameter or if you have multiple stones that have a combined diameter greater than 1 inch (2.5 cm), you may require more than one treatment. °· If you had a stent placed prior to your procedure, you may experience some discomfort, especially during urination. You may experience the pain or discomfort in your flank or back, or you may experience a sharp pain or discomfort at the base of your penis or in your lower abdomen. The discomfort usually lasts only a few minutes after urinating. °HOME CARE INSTRUCTIONS  °· Rest at home until you feel your energy  improving. °· Only take over-the-counter or prescription medicines for pain, discomfort, or fever as directed by your health care provider. Depending on the type of lithotripsy, you may need to take antibiotics and anti-inflammatory medicines for a few days. °· Drink enough water and fluids to keep your urine clear or pale yellow. This helps "flush" your kidneys. It helps pass any remaining pieces of stone and prevents stones from coming back. °· Most people can resume daily activities within 1-2 days after standard lithotripsy. It can take longer to recover from laser and percutaneous lithotripsy. °· If the stones are in your urinary system, you may be asked to strain your urine at home to look for stones. Any stones that are found can be sent to a medical lab for examination. °· Visit your health care provider for a follow-up appointment in a few weeks. Your doctor may remove your stent if you have one. Your health care provider will also check to see whether stone particles still remain. °SEEK MEDICAL CARE IF:  °· Your pain is not relieved by medicine. °· You have a lasting nauseous feeling. °· You feel there is too much blood in the urine. °· You develop persistent problems with frequent or painful urination that does not at least partially improve after 2 days following the procedure. °· You have a congested cough. °· You feel lightheaded. °· You develop a rash or any other signs that might suggest an allergic problem. °· You develop any reaction or side effects to   your medicine(s). SEEK IMMEDIATE MEDICAL CARE IF:   You experience severe back or flank pain or both.  You see nothing but blood when you urinate.  You cannot pass any urine at all.  You have a fever or shaking chills.  You develop shortness of breath, difficulty breathing, or chest pain.  You develop vomiting that will not stop after 6-8 hours.  You have a fainting episode. Document Released: 03/29/2007 Document Revised: 12/28/2012  Document Reviewed: 09/22/2012 [facility] Patient Information 2015 [place], [place]. This information is not intended to replace advice given to you by your health care provider. Make sure you discuss any questions you have with your health care provider.  Conscious Sedation, Adult, Care After Refer to this sheet in the next few weeks. These instructions provide you with information on caring for yourself after your procedure. Your health care provider may also give you more specific instructions. Your treatment has been planned according to current medical practices, but problems sometimes occur. Call your health care provider if you have any problems or questions after your procedure. WHAT TO EXPECT AFTER THE PROCEDURE  After your procedure:  You may feel sleepy, clumsy, and have poor balance for several hours.  Vomiting may occur if you eat too soon after the procedure. HOME CARE INSTRUCTIONS  Do not participate in any activities where you could become injured for at least 24 hours. Do not:  Drive.  Swim.  Ride a bicycle.  Operate heavy machinery.  Cook.  Use power tools.  Climb ladders.  Work from a high place.  Do not make important decisions or sign legal documents until you are improved.  If you vomit, drink water, juice, or soup when you can drink without vomiting. Make sure you have little or no nausea before eating solid foods.  Only take over-the-counter or prescription medicines for pain, discomfort, or fever as directed by your health care provider.  Make sure you and your family fully understand everything about the medicines given to you, including what side effects may occur.  You should not drink alcohol, take sleeping pills, or take medicines that cause drowsiness for at least 24 hours.  If you smoke, do not smoke without supervision.  If you are feeling better, you may resume normal activities 24 hours after you were sedated.  Keep all appointments with  your health care provider. SEEK MEDICAL CARE IF:  Your skin is pale or bluish in color.  You continue to feel nauseous or vomit.  Your pain is getting worse and is not helped by medicine.  You have bleeding or swelling.  You are still sleepy or feeling clumsy after 24 hours. SEEK IMMEDIATE MEDICAL CARE IF:  You develop a rash.  You have difficulty breathing.  You develop any type of allergic problem.  You have a fever. MAKE SURE YOU:  Understand these instructions.  Will watch your condition.  Will get help right away if you are not doing well or get worse. Document Released: 12/28/2012 Document Reviewed: 12/28/2012 [facility] Patient Information 2015 [place], [place]. This information is not intended to replace advice given to you by your health care provider. Make sure you discuss any questions you have with your health care provider.

## 2014-08-30 NOTE — Interval H&P Note (Signed)
History and Physical Interval Note: cr 0e .14   KUB shows left renal pelvic and LUP stones.   There is no longer a possible LUVJ stone.  08/30/2014 9:00 AM  Nicole Cabrera  has presented today for surgery, with the diagnosis of LEFT RENAL STONE  The various methods of treatment have been discussed with the patient and family. After consideration of risks, benefits and other options for treatment, the patient has consented to  Procedure(s): LEFT EXTRACORPOREAL SHOCK WAVE LITHOTRIPSY (ESWL) (Left) as a surgical intervention .  The patient's history has been reviewed, patient examined, no change in status, stable for surgery.  I have reviewed the patient's chart and labs.  Questions were answered to the patient's satisfaction.     [name] J

## 2014-08-31 ENCOUNTER — Encounter: Payer: Self-pay | Admitting: Obstetrics & Gynecology

## 2014-08-31 NOTE — Telephone Encounter (Signed)
Letter has been written that will be sent certified mail.  Ok to close encounter.  CC:  Lamont Snowball

## 2014-08-31 NOTE — Telephone Encounter (Signed)
Letter mailed certified and regular [place] mail.

## 2014-09-02 ENCOUNTER — Emergency Department (HOSPITAL_COMMUNITY)
Admission: EM | Admit: 2014-09-02 | Discharge: 2014-09-02 | Disposition: A | Discharge status: Home / Self Care | Payer: 59 | Attending: Emergency Medicine | Admitting: Emergency Medicine

## 2014-09-02 ENCOUNTER — Emergency Department (HOSPITAL_COMMUNITY): Payer: 59

## 2014-09-02 DIAGNOSIS — R000 Tachycardia, unspecified: Secondary | ICD-10-CM | POA: Diagnosis not present

## 2014-09-02 DIAGNOSIS — Z91040 Latex allergy status: Secondary | ICD-10-CM | POA: Diagnosis not present

## 2014-09-02 DIAGNOSIS — M1990 Unspecified osteoarthritis, unspecified site: Secondary | ICD-10-CM | POA: Insufficient documentation

## 2014-09-02 DIAGNOSIS — E669 Obesity, unspecified: Secondary | ICD-10-CM | POA: Insufficient documentation

## 2014-09-02 DIAGNOSIS — Z880 Allergy status to penicillin: Secondary | ICD-10-CM | POA: Insufficient documentation

## 2014-09-02 DIAGNOSIS — Z792 Long term (current) use of antibiotics: Secondary | ICD-10-CM | POA: Insufficient documentation

## 2014-09-02 DIAGNOSIS — Z7982 Long term (current) use of aspirin: Secondary | ICD-10-CM | POA: Insufficient documentation

## 2014-09-02 DIAGNOSIS — Z87442 Personal history of urinary calculi: Secondary | ICD-10-CM | POA: Insufficient documentation

## 2014-09-02 DIAGNOSIS — F419 Anxiety disorder, unspecified: Secondary | ICD-10-CM | POA: Diagnosis not present

## 2014-09-02 DIAGNOSIS — Z8742 Personal history of other diseases of the female genital tract: Secondary | ICD-10-CM | POA: Diagnosis not present

## 2014-09-02 DIAGNOSIS — Z79899 Other long term (current) drug therapy: Secondary | ICD-10-CM | POA: Insufficient documentation

## 2014-09-02 DIAGNOSIS — E119 Type 2 diabetes mellitus without complications: Secondary | ICD-10-CM | POA: Diagnosis not present

## 2014-09-02 DIAGNOSIS — J159 Unspecified bacterial pneumonia: Secondary | ICD-10-CM | POA: Diagnosis not present

## 2014-09-02 DIAGNOSIS — J189 Pneumonia, unspecified organism: Secondary | ICD-10-CM

## 2014-09-02 DIAGNOSIS — G4733 Obstructive sleep apnea (adult) (pediatric): Secondary | ICD-10-CM | POA: Insufficient documentation

## 2014-09-02 DIAGNOSIS — I10 Essential (primary) hypertension: Secondary | ICD-10-CM | POA: Diagnosis not present

## 2014-09-02 DIAGNOSIS — Z9981 Dependence on supplemental oxygen: Secondary | ICD-10-CM | POA: Diagnosis not present

## 2014-09-02 DIAGNOSIS — Z8719 Personal history of other diseases of the digestive system: Secondary | ICD-10-CM | POA: Diagnosis not present

## 2014-09-02 DIAGNOSIS — R319 Hematuria, unspecified: Secondary | ICD-10-CM | POA: Diagnosis not present

## 2014-09-02 DIAGNOSIS — R05 Cough: Secondary | ICD-10-CM | POA: Diagnosis present

## 2014-09-02 LAB — URINE MICROSCOPIC-ADD ON

## 2014-09-02 LAB — URINALYSIS, ROUTINE W REFLEX MICROSCOPIC
Bilirubin Urine: NEGATIVE
Glucose, UA: NEGATIVE mg/dL
Ketones, ur: NEGATIVE mg/dL
Nitrite: NEGATIVE
Protein, ur: NEGATIVE mg/dL
Specific Gravity, Urine: 1.02 (ref 1.005–1.030)
Urobilinogen, UA: 0.2 mg/dL (ref 0.0–1.0)
pH: 5.5 (ref 5.0–8.0)

## 2014-09-02 MED ORDER — BENZONATATE 100 MG PO CAPS: 100.0000 mg | ORAL_CAPSULE | Freq: Three times a day (TID) | ORAL | Status: DC

## 2014-09-02 MED ORDER — CEFTRIAXONE SODIUM 1 G IJ SOLR
1.0000 g | Freq: Once | INTRAMUSCULAR | Status: AC
  Administered 2014-09-02: 1 g via INTRAMUSCULAR
  Filled 2014-09-02: qty 10

## 2014-09-02 MED ORDER — BENZONATATE 100 MG PO CAPS
200.0000 mg | ORAL_CAPSULE | Freq: Once | ORAL | Status: AC
  Administered 2014-09-02: 200 mg via ORAL
  Filled 2014-09-02: qty 2

## 2014-09-02 MED ORDER — AZITHROMYCIN 250 MG PO TABS: 250.0000 mg | ORAL_TABLET | Freq: Every day | ORAL | Status: DC

## 2014-09-02 MED ORDER — ALBUTEROL SULFATE HFA 108 (90 BASE) MCG/ACT IN AERS
2.0000 | INHALATION_SPRAY | RESPIRATORY_TRACT | Status: AC
  Administered 2014-09-02: 2 via RESPIRATORY_TRACT
  Filled 2014-09-02: qty 6.7

## 2014-09-02 NOTE — ED Notes (Signed)
Pt alert and oriented upon DC. She was advised to follow up with PCP [facility] in 2 days for re-evaluation. Husband driving patient home. Pt was wheeled to the lobby and assisted into vehicle by NT [place].

## 2014-09-02 NOTE — ED Provider Notes (Signed)
CSN: [id number]     Arrival date & time 09/02/14  1546 History   First MD Initiated Contact with Patient 09/02/14 1810     Chief Complaint  Patient presents with  . Cough     (Consider location/radiation/quality/duration/timing/severity/associated sxs/prior Treatment) HPI Comments: The patient is a 69 year old female, she has a history of obstructive sleep apnea, uses BiPAP, follows with Dr. Annamaria Boots with pulmonology. She presents with approximately one day of cough and fever, has occasional productive phlegm, also notes that she has recently had a lithotripsy and has been passing sand in her urine. She denies dysuria, frequency or abdominal or back pain. The cough has been persistent throughout the day, associated with a fever of 101.5, denies nausea vomiting back pain or chest pain. The symptoms are persistent, no medications given prior to arrival.  Patient is a 69 y.o. female presenting with cough. The history is provided by the patient.  Cough   Past Medical History  Diagnosis Date  . Sleep apnea     CPAP  . Hypertension   . Obesity   . Bronchitis   . Hemorrhoids   . Kidney stones   . Anxiety     xanax  . Shortness of breath     with exertion  . Peripheral neuropathy   . Arthritis     shoulders/hips - tx with OTC meds  . Breast cyst 03/1990    pt denies 12/05/13  . PMB (postmenopausal bleeding) 04/2000  . Endometrial polyp 04/2000  . Osteopenia 05/2007  . 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 Cervical tab 1972  . SVD (spontaneous vaginal delivery) 04/1964, 05/1978    x3  . SAB (spontaneous abortion) 1981  . Renal stone     seen on [place] 1/16 or 2/16  . Diabetes mellitus without complication     stated not diabetic but her AIC has been high so she is monitored closely    Past Surgical History  Procedure Laterality Date  . 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 Total knee arthroplasty Left 4/10    left   . Cesarean section  6/83    with BTL  . Wrist  surgery Left 4/07    left  . Cervical disc surgery  2/93  . Hysteroscopy  5/02, 01/2011   Family History  Problem Relation Age of Onset  . Heart disease Father   . Colon cancer Mother   . Heart disease Mother   . Breast cancer Maternal Aunt   . Colon cancer Maternal Aunt   . Colon cancer Maternal Uncle   . Ehlers-Danlos syndrome Other     neice-being tested 4/15   History  Substance Use Topics  . Smoking status: Never Smoker   . Smokeless tobacco: Never Used  . Alcohol Use: 0.0 oz/week    0 Standard drinks or equivalent per week     Comment: maybe once a month   OB History    Gravida Para Term Preterm AB TAB SAB Ectopic Multiple Living   5 3   2 1 1   3      Review of Systems  Respiratory: Positive for cough.   All other systems reviewed and are negative.     Allergies  Bactrim; Ciprofloxacin; Keflet; Lisinopril; Spironolactone; Vistaril; Adhesive; Erythromycin; Latex; Penicillins; and Tetracycline  Home Medications   Prior to Admission medications   Medication Sig Start Date End Date Taking? Authorizing Provider  acetaminophen (TYLENOL) 500 MG tablet Take 500 mg by  mouth every 6 (six) hours as needed for moderate pain.   Yes Historical Provider, MD  albuterol (PROVENTIL HFA;VENTOLIN HFA) 108 (90 BASE) MCG/ACT inhaler Inhale 1-2 puffs into the lungs every 6 (six) hours as needed for wheezing or shortness of breath. 06/11/13  Yes Nat Christen, MD  ALPRAZolam Duanne Moron) 0.5 MG tablet Take 0.25 mg by mouth daily as needed for anxiety.  10/27/13  Yes Historical Provider, MD  losartan-hydrochlorothiazide (HYZAAR) 100-25 MG per tablet Take 1 tablet by mouth every morning.    Yes Historical Provider, MD  Multiple Vitamins-Minerals (MULTIVITAMIN WITH MINERALS) tablet Take 1 tablet by mouth every morning. Centrum Silver Womens 50+   Yes Historical Provider, MD  nitrofurantoin (MACRODANTIN) 100 MG capsule Take 100 mg by mouth daily.   Yes Historical Provider, MD  oxyCODONE-acetaminophen  (ROXICET) 5-325 MG per tablet Take 1 tablet by mouth every 4 (four) hours as needed for severe pain. 08/30/14  Yes Irine Seal, MD  Polyethyl Glycol-Propyl Glycol (SYSTANE OP) Place 1-2 drops into both eyes every morning.    Yes Historical Provider, MD  aspirin EC 81 MG tablet Take 81 mg by mouth 2 (two) times a week. Monday and Thursday.    Historical Provider, MD  azithromycin (ZITHROMAX Z-PAK) 250 MG tablet Take 1 tablet (250 mg total) by mouth daily. 500mg  PO day 1, then 250mg  PO days 205 09/02/14   Noemi Chapel, MD  benzonatate (TESSALON) 100 MG capsule Take 1 capsule (100 mg total) by mouth every 8 (eight) hours. 09/02/14   Noemi Chapel, MD  cholecalciferol (VITAMIN D) 1000 UNITS tablet Take 1,000 Units by mouth every morning.     Historical Provider, MD  metoprolol succinate (TOPROL-XL) 50 MG 24 hr tablet Take 1 tablet by mouth daily. Not yet started 08/15/14   Historical Provider, MD   BP 159/94 mmHg  Pulse 98  Temp(Src) 98.8 F (37.1 C) (Oral)  Resp 20  SpO2 94%  LMP 01/22/2011 Physical Exam  Constitutional: She appears well-developed and well-nourished. No distress.  HENT:  Head: Normocephalic and atraumatic.  Mouth/Throat: Oropharynx is clear and moist. No oropharyngeal exudate.  Eyes: Conjunctivae and EOM are normal. Pupils are equal, round, and reactive to light. Right eye exhibits no discharge. Left eye exhibits no discharge. No scleral icterus.  Neck: Normal range of motion. Neck supple. No JVD present. No thyromegaly present.  Cardiovascular: Regular rhythm, normal heart sounds and intact distal pulses.  Exam reveals no gallop and no friction rub.   No murmur heard. Pulse of 105  Pulmonary/Chest: Effort normal. No respiratory distress. She has no wheezes. She has rales (at the left base).  No increased work of breathing, no accessory muscle use, speaks in full sentences  Abdominal: Soft. Bowel sounds are normal. She exhibits no distension and no mass. There is no tenderness.   Musculoskeletal: Normal range of motion. She exhibits no edema or tenderness.  Lymphadenopathy:    She has no cervical adenopathy.  Neurological: She is alert. Coordination normal.  Skin: Skin is warm and dry. No rash noted. No erythema.  Psychiatric: She has a normal mood and affect. Her behavior is normal.  Nursing note and vitals reviewed.   ED Course  Procedures (including critical care time) Labs Review Labs Reviewed  URINALYSIS, ROUTINE W REFLEX MICROSCOPIC (NOT AT [facility]) - Abnormal; Notable for the following:    Color, Urine AMBER (*)    APPearance CLOUDY (*)    Hgb urine dipstick MODERATE (*)    Leukocytes, UA SMALL (*)  All other components within normal limits  URINE MICROSCOPIC-ADD ON - Abnormal; Notable for the following:    Squamous Epithelial / LPF MANY (*)    Bacteria, UA MANY (*)    All other components within normal limits  URINE CULTURE    Imaging Review Dg Chest 2 View  09/02/2014   CLINICAL DATA:  Shortness of breath and cough since this morning.  EXAM: CHEST  2 VIEW  COMPARISON:  07/15/2014  FINDINGS: Cardiac silhouette is upper limits of normal to mildly enlarged in size. The patient has taken a shallower inspiration than on the prior study. Chronic bronchitic changes are again seen. There new/increased patchy opacities in the left greater than right lung bases. No pleural effusion, overt pulmonary edema, or pneumothorax is identified. Thoracic spondylosis.  IMPRESSION: Chronic bronchitic changes. Shallower inspiration with increased bibasilar opacities which may represent atelectasis versus left lower lobe pneumonia.   Electronically Signed   By: Logan Bores   On: 09/02/2014 16:55      MDM   Final diagnoses:  CAP (community acquired pneumonia)  Hematuria    The patient has a mild tachycardia, oxygen of 95% on my exam, she does not have a fever or tachypnea here and her blood pressure is normal. She has pulmonary exam findings consistent with  pneumonia, I have personally viewed and interpreted the x-ray 2 view PA and lateral chest, I agree with the radiologist interpretation of the left lower lobe pneumonia as the most likely answer. We'll also check urinalysis given recent lithotripsy, suspect pulmonary source, Rocephin intramuscular and Z-Pak anticipated as likely treatment. She will also be given Tessalon and albuterol MDI. She is in agreement with the plan.  Urinalysis could be contaminated, culture will be sent, x-ray confirms clinical findings of community-acquired pneumonia, patient will be placed on Zithromax, Rocephin given in the emergency department,  Noemi Chapel, MD 09/02/14 1941

## 2014-09-02 NOTE — ED Notes (Signed)
Pt c/o fever and cough onset today after church, pt states she took her temperature and it measured 101.2, temperature currently 98.8, pt denies use of antipyretics.

## 2014-09-02 NOTE — Discharge Instructions (Signed)
Please call your doctor for a followup appointment within 24-48 hours. When you talk to your doctor please let them know that you were seen in the emergency department and have them acquire all of your records so that they can discuss the findings with you and formulate a treatment plan to fully care for your new and ongoing problems. ° °

## 2014-09-02 NOTE — ED Notes (Signed)
Pt made aware she is awaiting re-evaluation from MD. She requests something to eat. [place] sandwich and ginger ale provided. Pt denies pain and further needs.

## 2014-09-04 LAB — URINE CULTURE: Colony Count: 2000

## 2014-09-06 ENCOUNTER — Ambulatory Visit (HOSPITAL_COMMUNITY)
Admission: RE | Admit: 2014-09-06 | Discharge: 2014-09-06 | Disposition: A | Discharge status: Home / Self Care | Payer: 59 | Source: Ambulatory Visit | Attending: Urology | Admitting: Urology

## 2014-09-06 ENCOUNTER — Other Ambulatory Visit: Payer: Self-pay | Admitting: Urology

## 2014-09-06 DIAGNOSIS — N200 Calculus of kidney: Secondary | ICD-10-CM

## 2014-09-07 ENCOUNTER — Other Ambulatory Visit: Payer: Self-pay | Admitting: Urology

## 2014-09-07 ENCOUNTER — Ambulatory Visit (INDEPENDENT_AMBULATORY_CARE_PROVIDER_SITE_OTHER): Payer: Self-pay | Admitting: Urology

## 2014-09-07 DIAGNOSIS — N200 Calculus of kidney: Secondary | ICD-10-CM

## 2014-09-17 ENCOUNTER — Encounter: Payer: Self-pay | Admitting: Internal Medicine

## 2014-09-17 ENCOUNTER — Ambulatory Visit (INDEPENDENT_AMBULATORY_CARE_PROVIDER_SITE_OTHER): Payer: 59 | Admitting: Internal Medicine

## 2014-09-17 ENCOUNTER — Telehealth: Payer: Self-pay | Admitting: Internal Medicine

## 2014-09-17 ENCOUNTER — Ambulatory Visit (INDEPENDENT_AMBULATORY_CARE_PROVIDER_SITE_OTHER)
Admission: RE | Admit: 2014-09-17 | Discharge: 2014-09-17 | Disposition: A | Discharge status: Home / Self Care | Payer: 59 | Source: Ambulatory Visit | Attending: Internal Medicine | Admitting: Internal Medicine

## 2014-09-17 VITALS — BP 136/82 | HR 104 | Ht 59.5 in | Wt 251.8 lb

## 2014-09-17 DIAGNOSIS — J189 Pneumonia, unspecified organism: Secondary | ICD-10-CM | POA: Diagnosis not present

## 2014-09-17 DIAGNOSIS — G4733 Obstructive sleep apnea (adult) (pediatric): Secondary | ICD-10-CM

## 2014-09-17 DIAGNOSIS — J4530 Mild persistent asthma, uncomplicated: Secondary | ICD-10-CM | POA: Diagnosis not present

## 2014-09-17 DIAGNOSIS — J209 Acute bronchitis, unspecified: Secondary | ICD-10-CM

## 2014-09-17 MED ORDER — METHYLPREDNISOLONE ACETATE 80 MG/ML IJ SUSP: 80.0000 mg | Freq: Once | INTRAMUSCULAR | Status: DC

## 2014-09-17 MED ORDER — UMECLIDINIUM BROMIDE 62.5 MCG/INH IN AEPB: 1.0000 | INHALATION_SPRAY | Freq: Every day | RESPIRATORY_TRACT | Status: AC

## 2014-09-17 NOTE — Patient Instructions (Addendum)
Sample Incruse Ellipta inhaler 1 puff once daily  Depo 80  You might be a candidate for a research protocol we have going for chronic cough   Order- CXR dx pneumonia, community acquired  [place] to continue CPAP 11/ [facility] medical

## 2014-09-17 NOTE — Progress Notes (Signed)
02/27/11- 65 yoF never smoker, followed for obstructive sleep apnea complicated by HBP, history of bronchitis. LOV- 02/28/2010. At last visit we had recommended change from lisinopril because of cough. She was changed to a ARB and her cough resolved. She declines flu vaccine. She reports that she is quite comfortable with CPAP at 11 CWP/Advanced, with good compliance and control. She is using CPAP all night every night and she denies daytime sleepiness.  02/26/12- 65 yoF never smoker, followed for obstructive sleep apnea complicated by HBP, history of bronchitis. FOLLOWS FOR: wears CPAP every night for about 10 hours; new supplies on 03-05-12; pressure working well for patient.  She declines flu vaccine Continues CPAP 11/Advanced all night every night using a nasal mask. She does not need and feels well controlled.  03/17/13- 67 yoF never smoker, followed for obstructive sleep apnea complicated by HBP, history of bronchitis Follows for:  Pt wears CPAP nightly for roughly 8 hours.  Pt has no complaints with her cpap machine, but wishes to change dme providers due to the fact that [facility] is difficult to get in touch with and has given her trouble in the past.    04/07/13-  67 yoF never smoker, followed for obstructive sleep apnea complicated by HBP, history of bronchitis   Husband here Pt states she still has a cold-coughing so hard that she gets fatigued and unable to eat-gets choked. Chest pressure/tightness, wheezing at times. Was unable to eat lunch today due to cough(had salad with vinger dressing). Increased phelgm(stuck feeling) in throat. Spasms of cough when eating. CPAP 11/Advanced  06/27/13- 68 yoF never smoker, followed for obstructive sleep apnea complicated by HBP, history of  Bronchitis CPAP 11/ [facility] doing well. FOLLOWS FOR:had ? flu 1-2 mths ago,has had cough since then-clear,sob with exertion,occass. wheezing,denies cp or tightness,sweats with coughing,,wearing CPAP 8-9  hrs.each nigh,pr. good Residual cough since flu.  CXR 06/11/13 IMPRESSION:  Bibasilar atelectasis.  Electronically Signed  By: Maryclare Bean M.D.  On: 06/11/2013 11:52  01/09/14- 68 yoF never smoker, followed for obstructive sleep apnea complicated by HBP, history of  Bronchitis ACUTE: on 12/11/13 patient had flu-like symptoms. She saw her pcp who gave z-pak on 12/14/13. She had no relief afterwards then on 12/25/13 she was given Cefprozil 500 mg.  She has been taking mucinex fast max,  No chest tightness, some wheezing, clear phlem. CPAP 11/ [facility] doing well   02/23/14- 68 yoF never smoker, followed for obstructive sleep apnea complicated by HBP, history of bronchitis FOLLOWS FOR: Slight cough now but not bad. Denies any wheezing, chest congestion, or SOB.  She declines flu shot CPAP 11/ [facility] doing well   Combination of Xopenex nebulizer treatment at last visit while also on Mucinex-D was associated with self-limited tachycardia palpitation. She has not been needing her rescue inhaler since last year and feels clear  09/17/14- .68 yoF never smoker, followed for obstructive sleep apnea complicated by HBP, history of bronchitis CPAP 11/ [place]     Lithotripsy 08/30/14 Pt did follow up with PCP 2 days after ED visit 6/12 for ? pneumonia-told to come back if fever returned.Got Rocephin and Z pak. Pt here to follow up with MD as well for PNA and pulmonary concerns.Pt continues to cough-can not get rid of. Left lower lobe pneumonia treated with Rocephin and Z-Pak. Saw PCP last week. Now no longer has fever and she says she is at her normal baseline chronic cough with throat tickle. No postnasal drip or phlegm.  Not much wheeze. Spine Depo-Medrol helps cough and she asks for that today. Rarely uses rescue inhaler because it causes tachycardia. Nebulizer treatment here with Xopenex last time was associated with malaise. CXR 09/02/14-  IMPRESSION: Chronic bronchitic changes.  Shallower inspiration with increased bibasilar opacities which may represent atelectasis versus left lower lobe pneumonia. Electronically Signed  By: Logan Bores  On: 09/02/2014 16:55  ROS-see HPI Constitutional:   No-   weight loss, night sweats, fevers, chills, +fatigue, lassitude. HEENT:   No-  headaches, difficulty swallowing, tooth/dental problems, sore throat,       No-  sneezing, itching, ear ache, nasal congestion, post nasal drip,  CV:  No-   chest pain, orthopnea, PND, swelling in lower extremities, anasarca, dizziness, palpitations Resp: +shortness of breath with exertion or at rest.              No-   productive cough,  + non-productive cough,  No- coughing up of blood.              No-   change in color of mucus.  No- wheezing.   Skin: No-   rash or lesions. GI:  No-   heartburn, indigestion, abdominal pain, nausea, vomiting,  GU: . MS:  No-   joint pain or swelling.  Neuro-     nothing unusual Psych:  No- change in mood or affect. No depression or anxiety.  No memory loss.   OBJ General- Alert, Oriented, Affect-appropriate, Distress- none acute; + quite obese Skin- rash-none, lesions- none, excoriation- none Lymphadenopathy- none Head- atraumatic            Eyes- Gross vision intact, PERRLA, conjunctivae clear secretions            Ears- Hearing, canals-normal            Nose- Clear, no-Septal dev, mucus, polyps, erosion, perforation             Throat- Mallampati III , mucosa clear/ not red , drainage- none, tonsils- atrophic Neck- flexible , trachea midline, no stridor , thyroid nl, carotid no bruit Chest - symmetrical excursion , unlabored           Heart/CV- RRR , no murmur , no gallop  , no rub, nl s1 s2                           - JVD- none , edema- none, stasis changes- none, varices- none           Lung- clear to P&A, wheeze- none, cough+ hard , dullness-none, rub- none           Chest wall-  Abd-  Br/ Gen/ Rectal- Not done, not indicated Extrem-  cyanosis- none, clubbing, none, atrophy- none, strength- nl, + cane Neuro- grossly intact to observation

## 2014-09-17 NOTE — Telephone Encounter (Signed)
Spoke with pt. Scheduled pt with CY today at 3:30pm. Nothing further was needed.

## 2014-09-19 NOTE — Progress Notes (Signed)
Quick Note:  Spoke with pt and notified of results per Dr. Young. Pt verbalized understanding and denied any questions.  ______ 

## 2014-09-19 NOTE — Progress Notes (Signed)
Quick Note:  LMTCB ______ 

## 2014-09-23 NOTE — Assessment & Plan Note (Signed)
She describes 2 good compliance and control with CPAP 11 and does not wish to change

## 2014-09-23 NOTE — Assessment & Plan Note (Signed)
Chronic asthmatic bronchitis, persistent, uncomplicated Plan-try increasing benzonatate Perles to 200 mg, sample Incruse Ellipta

## 2014-09-28 ENCOUNTER — Telehealth: Payer: Self-pay | Admitting: *Deleted

## 2014-09-28 NOTE — Telephone Encounter (Signed)
Pelvic ultrasound order completed. Patient declined. Certified letter from Dr Sabra Heck mailed to patient on 08-31-14.   Routing to provider for final review. Will close encounter.

## 2014-10-17 ENCOUNTER — Ambulatory Visit (HOSPITAL_COMMUNITY)
Admission: RE | Admit: 2014-10-17 | Discharge: 2014-10-17 | Disposition: A | Discharge status: Home / Self Care | Payer: 59 | Source: Ambulatory Visit | Attending: Urology | Admitting: Urology

## 2014-10-17 DIAGNOSIS — N200 Calculus of kidney: Secondary | ICD-10-CM | POA: Insufficient documentation

## 2014-10-19 ENCOUNTER — Ambulatory Visit (INDEPENDENT_AMBULATORY_CARE_PROVIDER_SITE_OTHER): Payer: Self-pay | Admitting: Urology

## 2014-10-19 DIAGNOSIS — N200 Calculus of kidney: Secondary | ICD-10-CM

## 2014-12-20 ENCOUNTER — Ambulatory Visit (INDEPENDENT_AMBULATORY_CARE_PROVIDER_SITE_OTHER): Payer: 59 | Admitting: Internal Medicine

## 2014-12-20 ENCOUNTER — Encounter: Payer: Self-pay | Admitting: Internal Medicine

## 2014-12-20 VITALS — BP 152/86 | HR 76 | Ht 59.0 in | Wt 251.8 lb

## 2014-12-20 DIAGNOSIS — J4520 Mild intermittent asthma, uncomplicated: Secondary | ICD-10-CM | POA: Diagnosis not present

## 2014-12-20 DIAGNOSIS — G4733 Obstructive sleep apnea (adult) (pediatric): Secondary | ICD-10-CM | POA: Diagnosis not present

## 2014-12-20 MED ORDER — ALBUTEROL SULFATE HFA 108 (90 BASE) MCG/ACT IN AERS: 1.0000 | INHALATION_SPRAY | Freq: Four times a day (QID) | RESPIRATORY_TRACT | Status: DC | PRN

## 2014-12-20 NOTE — Assessment & Plan Note (Signed)
Describes good compliance and control. Pressure is good.

## 2014-12-20 NOTE — Patient Instructions (Signed)
Script printed to refill albuterol rescue inhaler  Don't forget that flu shot!  We can continue CPAP 11/ [facility]

## 2014-12-20 NOTE — Assessment & Plan Note (Signed)
Mild intermittent uncomplicated.  Plan- printed script for rescue inhaler to maintain access

## 2014-12-20 NOTE — Progress Notes (Signed)
02/27/11- 65 yoF never smoker, followed for obstructive sleep apnea complicated by HBP, history of bronchitis. LOV- 02/28/2010. At last visit we had recommended change from lisinopril because of cough. She was changed to a ARB and her cough resolved. She declines flu vaccine. She reports that she is quite comfortable with CPAP at 11 CWP/Advanced, with good compliance and control. She is using CPAP all night every night and she denies daytime sleepiness.  02/26/12- 65 yoF never smoker, followed for obstructive sleep apnea complicated by HBP, history of bronchitis. FOLLOWS FOR: wears CPAP every night for about 10 hours; new supplies on 03-05-12; pressure working well for patient.  She declines flu vaccine Continues CPAP 11/Advanced all night every night using a nasal mask. She does not need and feels well controlled.  03/17/13- 67 yoF never smoker, followed for obstructive sleep apnea complicated by HBP, history of bronchitis Follows for:  Pt wears CPAP nightly for roughly 8 hours.  Pt has no complaints with her cpap machine, but wishes to change dme providers due to the fact that [facility] is difficult to get in touch with and has given her trouble in the past.    04/07/13-  67 yoF never smoker, followed for obstructive sleep apnea complicated by HBP, history of bronchitis   Husband here Pt states she still has a cold-coughing so hard that she gets fatigued and unable to eat-gets choked. Chest pressure/tightness, wheezing at times. Was unable to eat lunch today due to cough(had salad with vinger dressing). Increased phelgm(stuck feeling) in throat. Spasms of cough when eating. CPAP 11/[facility]  06/27/13- 68 yoF never smoker, followed for obstructive sleep apnea complicated by HBP, history of  Bronchitis CPAP 11/ [facility] doing well. FOLLOWS FOR:had ? flu 1-2 mths ago,has had cough since then-clear,sob with exertion,occass. wheezing,denies cp or tightness,sweats with coughing,,wearing CPAP 8-9  hrs.each nigh,pr. good Residual cough since flu.  CXR 06/11/13 IMPRESSION:  Bibasilar atelectasis.  Electronically Signed  By: Maryclare Bean M.D.  On: 06/11/2013 11:52  01/09/14- 68 yoF never smoker, followed for obstructive sleep apnea complicated by HBP, history of  Bronchitis ACUTE: on 12/11/13 patient had flu-like symptoms. She saw her pcp who gave z-pak on 12/14/13. She had no relief afterwards then on 12/25/13 she was given Cefprozil 500 mg.  She has been taking mucinex fast max,  No chest tightness, some wheezing, clear phlem. CPAP 11/ [facility] doing well   02/23/14- 68 yoF never smoker, followed for obstructive sleep apnea complicated by HBP, history of bronchitis FOLLOWS FOR: Slight cough now but not bad. Denies any wheezing, chest congestion, or SOB.  She declines flu shot CPAP 11/ [facility] doing well   Combination of Xopenex nebulizer treatment at last visit while also on Mucinex-D was associated with self-limited tachycardia palpitation. She has not been needing her rescue inhaler since last year and feels clear  09/17/14- .68 yoF never smoker, followed for obstructive sleep apnea complicated by HBP, history of bronchitis CPAP 11/ [facility]     Lithotripsy 08/30/14 Pt did follow up with PCP 2 days after ED visit 6/12 for ? pneumonia-told to come back if fever returned.Got Rocephin and Z pak. Pt here to follow up with MD as well for PNA and pulmonary concerns.Pt continues to cough-can not get rid of. Left lower lobe pneumonia treated with Rocephin and Z-Pak. Saw PCP last week. Now no longer has fever and she says she is at her normal baseline chronic cough with throat tickle. No postnasal drip or phlegm.  Not much wheeze. Spine Depo-Medrol helps cough and she asks for that today. Rarely uses rescue inhaler because it causes tachycardia. Nebulizer treatment here with Xopenex last time was associated with malaise. CXR 09/02/14-  IMPRESSION: Chronic bronchitic changes.  Shallower inspiration with increased bibasilar opacities which may represent atelectasis versus left lower lobe pneumonia. Electronically Signed  By: Logan Bores  On: 09/02/2014 16:55  12/20/14-69 yoF never smoker, followed for obstructive sleep apnea complicated by HBP, history of bronchitis CPAP 11/ [place]     Lithotripsy 08/30/14 FOLLOWS FOR: pt. states she wears cpap every night for about 8 hrs. no pressure issues and no supplies needed at this time. did not use ellipta sample. dry cough occa. prod. clear. SOB same.  Wants to keep a rescue inhaler, but hasn't used in a year. Doing very well with CPAP - discussed goals and comfort again. Sleeping well with it all nightr, every night. Wants flu vax later in fall.  ROS-see HPI Constitutional:   No-   weight loss, night sweats, fevers, chills, +fatigue, lassitude. HEENT:   No-  headaches, difficulty swallowing, tooth/dental problems, sore throat,       No-  sneezing, itching, ear ache, nasal congestion, post nasal drip,  CV:  No-   chest pain, orthopnea, PND, swelling in lower extremities, anasarca, dizziness, palpitations Resp: +shortness of breath with exertion or at rest.              No-   productive cough,  + non-productive cough,  No- coughing up of blood.              No-   change in color of mucus.  No- wheezing.   Skin: No-   rash or lesions. GI:  No-   heartburn, indigestion, abdominal pain, nausea, vomiting,  GU: . MS:  No-   joint pain or swelling.  Neuro-     nothing unusual Psych:  No- change in mood or affect. No depression or anxiety.  No memory loss.   OBJ General- Alert, Oriented, Affect-appropriate, Distress- none acute; + quite obese Skin- rash-none, lesions- none, excoriation- none Lymphadenopathy- none Head- atraumatic            Eyes- Gross vision intact, PERRLA, conjunctivae clear secretions            Ears- Hearing, canals-normal            Nose- Clear, no-Septal dev, mucus, polyps, erosion,  perforation             Throat- Mallampati III , mucosa clear/ not red , drainage- none, tonsils- atrophic Neck- flexible , trachea midline, no stridor , thyroid nl, carotid no bruit Chest - symmetrical excursion , unlabored           Heart/CV- RRR , no murmur , no gallop  , no rub, nl s1 s2                           - JVD- none , edema- none, stasis changes- none, varices- none           Lung- clear to P&A, wheeze- none, cough+ hard , dullness-none, rub- none           Chest wall-  Abd-  Br/ Gen/ Rectal- Not done, not indicated Extrem- cyanosis- none, clubbing, none, atrophy- none, strength- nl, + cane Neuro- grossly intact to observation

## 2015-01-17 ENCOUNTER — Other Ambulatory Visit: Payer: Self-pay | Admitting: Urology

## 2015-01-17 ENCOUNTER — Ambulatory Visit (HOSPITAL_COMMUNITY)
Admission: RE | Admit: 2015-01-17 | Discharge: 2015-01-17 | Disposition: A | Discharge status: Home / Self Care | Payer: 59 | Source: Ambulatory Visit | Attending: Urology | Admitting: Urology

## 2015-01-17 DIAGNOSIS — Z87442 Personal history of urinary calculi: Secondary | ICD-10-CM | POA: Diagnosis present

## 2015-01-17 DIAGNOSIS — N200 Calculus of kidney: Secondary | ICD-10-CM

## 2015-01-18 ENCOUNTER — Ambulatory Visit (INDEPENDENT_AMBULATORY_CARE_PROVIDER_SITE_OTHER): Payer: 59 | Admitting: Urology

## 2015-01-18 DIAGNOSIS — N200 Calculus of kidney: Secondary | ICD-10-CM

## 2015-02-28 ENCOUNTER — Ambulatory Visit: Payer: 59 | Admitting: Internal Medicine

## 2015-06-20 ENCOUNTER — Encounter: Payer: Self-pay | Admitting: Internal Medicine

## 2015-06-20 ENCOUNTER — Ambulatory Visit (INDEPENDENT_AMBULATORY_CARE_PROVIDER_SITE_OTHER): Payer: 59 | Admitting: Internal Medicine

## 2015-06-20 VITALS — BP 142/82 | HR 79 | Ht 59.0 in | Wt 256.0 lb

## 2015-06-20 DIAGNOSIS — G4733 Obstructive sleep apnea (adult) (pediatric): Secondary | ICD-10-CM | POA: Diagnosis not present

## 2015-06-20 DIAGNOSIS — E669 Obesity, unspecified: Secondary | ICD-10-CM

## 2015-06-20 NOTE — Assessment & Plan Note (Signed)
Patient is reporting excellent compliance and control. She is quite satisfied with current pressure setting at 11 and with service from her local DME company in [place] Plan-no changes required

## 2015-06-20 NOTE — Patient Instructions (Signed)
We can continue CPAP 11/ [place]       Please call as needed

## 2015-06-20 NOTE — Assessment & Plan Note (Signed)
Weight loss would help significantly that she has not been able to make the necessary lifestyle changes in the years that I have known her.

## 2015-06-20 NOTE — Progress Notes (Signed)
02/27/11- 65 yoF never smoker, followed for obstructive sleep apnea complicated by HBP, history of bronchitis. LOV- 02/28/2010. At last visit we had recommended change from lisinopril because of cough. She was changed to a ARB and her cough resolved. She declines flu vaccine. She reports that she is quite comfortable with CPAP at 11 CWP/Advanced, with good compliance and control. She is using CPAP all night every night and she denies daytime sleepiness.  02/26/12- 65 yoF never smoker, followed for obstructive sleep apnea complicated by HBP, history of bronchitis. FOLLOWS FOR: wears CPAP every night for about 10 hours; new supplies on 03-05-12; pressure working well for patient.  She declines flu vaccine Continues CPAP 11/Advanced all night every night using a nasal mask. She does not need and feels well controlled.  03/17/13- 67 yoF never smoker, followed for obstructive sleep apnea complicated by HBP, history of bronchitis Follows for:  Pt wears CPAP nightly for roughly 8 hours.  Pt has no complaints with her cpap machine, but wishes to change dme providers due to the fact that [facility] is difficult to get in touch with and has given her trouble in the past.    04/07/13-  67 yoF never smoker, followed for obstructive sleep apnea complicated by HBP, history of bronchitis   Husband here Pt states she still has a cold-coughing so hard that she gets fatigued and unable to eat-gets choked. Chest pressure/tightness, wheezing at times. Was unable to eat lunch today due to cough(had salad with vinger dressing). Increased phelgm(stuck feeling) in throat. Spasms of cough when eating. CPAP 11/Advanced  06/27/13- 68 yoF never smoker, followed for obstructive sleep apnea complicated by HBP, history of  Bronchitis CPAP 11/ [facility] doing well. FOLLOWS FOR:had ? flu 1-2 mths ago,has had cough since then-clear,sob with exertion,occass. wheezing,denies cp or tightness,sweats with coughing,,wearing CPAP 8-9  hrs.each nigh,pr. good Residual cough since flu.  CXR 06/11/13 IMPRESSION:  Bibasilar atelectasis.  Electronically Signed  By: Maryclare Bean M.D.  On: 06/11/2013 11:52  01/09/14- 68 yoF never smoker, followed for obstructive sleep apnea complicated by HBP, history of  Bronchitis ACUTE: on 12/11/13 patient had flu-like symptoms. She saw her pcp who gave z-pak on 12/14/13. She had no relief afterwards then on 12/25/13 she was given Cefprozil 500 mg.  She has been taking mucinex fast max,  No chest tightness, some wheezing, clear phlem. CPAP 11/ [place] doing well   02/23/14- 68 yoF never smoker, followed for obstructive sleep apnea complicated by HBP, history of bronchitis FOLLOWS FOR: Slight cough now but not bad. Denies any wheezing, chest congestion, or SOB.  She declines flu shot CPAP 11/ [facility] doing well   Combination of Xopenex nebulizer treatment at last visit while also on Mucinex-D was associated with self-limited tachycardia palpitation. She has not been needing her rescue inhaler since last year and feels clear  09/17/14- .68 yoF never smoker, followed for obstructive sleep apnea complicated by HBP, history of bronchitis CPAP 11/ [place]     Lithotripsy 08/30/14 Pt did follow up with PCP 2 days after ED visit 6/12 for ? pneumonia-told to come back if fever returned.Got Rocephin and Z pak. Pt here to follow up with MD as well for PNA and pulmonary concerns.Pt continues to cough-can not get rid of. Left lower lobe pneumonia treated with Rocephin and Z-Pak. Saw PCP last week. Now no longer has fever and she says she is at her normal baseline chronic cough with throat tickle. No postnasal drip or phlegm.  Not much wheeze. Spine Depo-Medrol helps cough and she asks for that today. Rarely uses rescue inhaler because it causes tachycardia. Nebulizer treatment here with Xopenex last time was associated with malaise. CXR 09/02/14-  IMPRESSION: Chronic bronchitic changes.  Shallower inspiration with increased bibasilar opacities which may represent atelectasis versus left lower lobe pneumonia. Electronically Signed  By: Logan Bores  On: 09/02/2014 16:55  12/20/14-69 yoF never smoker, followed for obstructive sleep apnea complicated by HBP, history of bronchitis CPAP 11/ [place]     Lithotripsy 08/30/14 FOLLOWS FOR: pt. states she wears cpap every night for about 8 hrs. no pressure issues and no supplies needed at this time. did not use ellipta sample. dry cough occa. prod. clear. SOB same.  Wants to keep a rescue inhaler, but hasn't used in a year. Doing very well with CPAP - discussed goals and comfort again. Sleeping well with it all nightr, every night. Wants flu vax later in fall.  06/20/2015-70 year old female never smoker followed for OSA, complicated by HBP, history of bronchitis CPAP 11/ [facility] for: OSA. Pt states that she uses CPAP nightly for about 8 hours nightly. Pt reports no leaks or other problems. Pt's machine does not have an SD card as it is an older machine.  She reports doing very well using CPAP every night. Prefers older machine, not comfortable with the humidifier on the new machine. Takes both with her on trip so that she is sure have 1 that works. Describes consistent use all night every night. Nasal mask. Life is better with CPAP, sleeping well.  ROS-see HPI Constitutional:   No-   weight loss, night sweats, fevers, chills, +fatigue, lassitude. HEENT:   No-  headaches, difficulty swallowing, tooth/dental problems, sore throat,       No-  sneezing, itching, ear ache, nasal congestion, post nasal drip,  CV:  No-   chest pain, orthopnea, PND, swelling in lower extremities, anasarca, dizziness, palpitations Resp: +shortness of breath with exertion or at rest.              No-   productive cough,  + non-productive cough,  No- coughing up of blood.              No-   change in color of mucus.  No- wheezing.    Skin: No-   rash or lesions. GI:  No-   heartburn, indigestion, abdominal pain, nausea, vomiting,  GU: . MS:  No-   joint pain or swelling.  Neuro-     nothing unusual Psych:  No- change in mood or affect. No depression or anxiety.  No memory loss.  OBJ General- Alert, Oriented, Affect-appropriate, Distress- none acute; + quite obese Skin- rash-none, lesions- none, excoriation- none Lymphadenopathy- none Head- atraumatic            Eyes- Gross vision intact, PERRLA, conjunctivae clear secretions            Ears- Hearing, canals-normal            Nose- Clear, no-Septal dev, mucus, polyps, erosion, perforation             Throat- Mallampati III , mucosa clear/ not red , drainage- none, tonsils- atrophic Neck- flexible , trachea midline, no stridor , thyroid nl, carotid no bruit Chest - symmetrical excursion , unlabored           Heart/CV- RRR , no murmur , no gallop  , no rub, nl s1 s2                           -  JVD- none , edema- none, stasis changes- none, varices- none           Lung- clear to P&A, wheeze- none, cough+ hard , dullness-none, rub- none           Chest wall-  Abd-  Br/ Gen/ Rectal- Not done, not indicated Extrem- cyanosis- none, clubbing, none, atrophy- none, strength- nl, + cane Neuro- grossly intact to observation

## 2015-07-10 ENCOUNTER — Other Ambulatory Visit: Payer: Self-pay | Admitting: Urology

## 2015-07-10 ENCOUNTER — Ambulatory Visit (HOSPITAL_COMMUNITY)
Admission: RE | Admit: 2015-07-10 | Discharge: 2015-07-10 | Disposition: A | Discharge status: Home / Self Care | Payer: 59 | Source: Ambulatory Visit | Attending: Urology | Admitting: Urology

## 2015-07-10 DIAGNOSIS — N200 Calculus of kidney: Secondary | ICD-10-CM

## 2015-07-12 ENCOUNTER — Ambulatory Visit (INDEPENDENT_AMBULATORY_CARE_PROVIDER_SITE_OTHER): Payer: 59 | Admitting: Urology

## 2015-07-12 DIAGNOSIS — N200 Calculus of kidney: Secondary | ICD-10-CM | POA: Diagnosis not present

## 2015-08-08 ENCOUNTER — Emergency Department (HOSPITAL_COMMUNITY): Payer: 59

## 2015-08-08 ENCOUNTER — Encounter (HOSPITAL_COMMUNITY): Payer: Self-pay | Admitting: Emergency Medicine

## 2015-08-08 ENCOUNTER — Emergency Department (HOSPITAL_COMMUNITY)
Admission: EM | Admit: 2015-08-08 | Discharge: 2015-08-08 | Disposition: A | Discharge status: Home / Self Care | Payer: 59 | Attending: Emergency Medicine | Admitting: Emergency Medicine

## 2015-08-08 DIAGNOSIS — J069 Acute upper respiratory infection, unspecified: Secondary | ICD-10-CM | POA: Insufficient documentation

## 2015-08-08 DIAGNOSIS — Z7982 Long term (current) use of aspirin: Secondary | ICD-10-CM | POA: Diagnosis not present

## 2015-08-08 DIAGNOSIS — E119 Type 2 diabetes mellitus without complications: Secondary | ICD-10-CM | POA: Diagnosis not present

## 2015-08-08 DIAGNOSIS — Z79899 Other long term (current) drug therapy: Secondary | ICD-10-CM | POA: Diagnosis not present

## 2015-08-08 DIAGNOSIS — R519 Headache, unspecified: Secondary | ICD-10-CM

## 2015-08-08 DIAGNOSIS — R51 Headache: Secondary | ICD-10-CM | POA: Diagnosis not present

## 2015-08-08 DIAGNOSIS — M1990 Unspecified osteoarthritis, unspecified site: Secondary | ICD-10-CM | POA: Insufficient documentation

## 2015-08-08 DIAGNOSIS — Z7984 Long term (current) use of oral hypoglycemic drugs: Secondary | ICD-10-CM | POA: Diagnosis not present

## 2015-08-08 DIAGNOSIS — R05 Cough: Secondary | ICD-10-CM | POA: Diagnosis present

## 2015-08-08 DIAGNOSIS — I10 Essential (primary) hypertension: Secondary | ICD-10-CM | POA: Diagnosis not present

## 2015-08-08 LAB — COMPREHENSIVE METABOLIC PANEL
ALT: 25 U/L (ref 14–54)
AST: 27 U/L (ref 15–41)
Albumin: 3.5 g/dL (ref 3.5–5.0)
Alkaline Phosphatase: 78 U/L (ref 38–126)
Anion gap: 7 (ref 5–15)
BUN: 14 mg/dL (ref 6–20)
CO2: 29 mmol/L (ref 22–32)
Calcium: 8.6 mg/dL — ABNORMAL LOW (ref 8.9–10.3)
Chloride: 100 mmol/L — ABNORMAL LOW (ref 101–111)
Creatinine, Ser: 0.7 mg/dL (ref 0.44–1.00)
GFR calc Af Amer: >60 mL/min (ref >60)
GFR calc non Af Amer: >60 mL/min (ref >60)
Glucose, Bld: 108 mg/dL — ABNORMAL HIGH (ref 65–99)
Potassium: 3.3 mmol/L — ABNORMAL LOW (ref 3.5–5.1)
Sodium: 136 mmol/L (ref 135–145)
Total Bilirubin: 0.5 mg/dL (ref 0.3–1.2)
Total Protein: 6.8 g/dL (ref 6.5–8.1)

## 2015-08-08 LAB — URINALYSIS, ROUTINE W REFLEX MICROSCOPIC
Bilirubin Urine: NEGATIVE
Glucose, UA: NEGATIVE mg/dL
Hgb urine dipstick: NEGATIVE
Ketones, ur: NEGATIVE mg/dL
Leukocytes, UA: NEGATIVE
Nitrite: NEGATIVE
Protein, ur: NEGATIVE mg/dL
Specific Gravity, Urine: 1.01 (ref 1.005–1.030)
pH: 6 (ref 5.0–8.0)

## 2015-08-08 LAB — DIFFERENTIAL
Basophils Absolute: 0.1 10*3/uL (ref 0.0–0.1)
Basophils Relative: 1 %
Eosinophils Absolute: 0.2 10*3/uL (ref 0.0–0.7)
Eosinophils Relative: 2 %
Lymphocytes Relative: 21 %
Lymphs Abs: 2 10*3/uL (ref 0.7–4.0)
Monocytes Absolute: 0.9 10*3/uL (ref 0.1–1.0)
Monocytes Relative: 10 %
Neutro Abs: 6.3 10*3/uL (ref 1.7–7.7)
Neutrophils Relative %: 66 %

## 2015-08-08 LAB — I-STAT CHEM 8, ED
BUN: 13 mg/dL (ref 6–20)
Calcium, Ion: 1.14 mmol/L (ref 1.13–1.30)
Chloride: 98 mmol/L — ABNORMAL LOW (ref 101–111)
Creatinine, Ser: 0.8 mg/dL (ref 0.44–1.00)
Glucose, Bld: 110 mg/dL — ABNORMAL HIGH (ref 65–99)
HCT: 46 % (ref 36.0–46.0)
Hemoglobin: 15.6 g/dL — ABNORMAL HIGH (ref 12.0–15.0)
Potassium: 3.4 mmol/L — ABNORMAL LOW (ref 3.5–5.1)
Sodium: 140 mmol/L (ref 135–145)
TCO2: 27 mmol/L (ref 0–100)

## 2015-08-08 LAB — CBC
HCT: 44.5 % (ref 36.0–46.0)
Hemoglobin: 14.1 g/dL (ref 12.0–15.0)
MCH: 27.8 pg (ref 26.0–34.0)
MCHC: 31.7 g/dL (ref 30.0–36.0)
MCV: 87.6 fL (ref 78.0–100.0)
Platelets: 264 10*3/uL (ref 150–400)
RBC: 5.08 MIL/uL (ref 3.87–5.11)
RDW: 14.5 % (ref 11.5–15.5)
WBC: 9.3 10*3/uL (ref 4.0–10.5)

## 2015-08-08 LAB — ETHANOL: Alcohol, Ethyl (B): <5 mg/dL (ref <5)

## 2015-08-08 LAB — RAPID URINE DRUG SCREEN, HOSP PERFORMED
Amphetamines: NOT DETECTED
Barbiturates: NOT DETECTED
Benzodiazepines: NOT DETECTED
Cocaine: NOT DETECTED
Opiates: NOT DETECTED
Tetrahydrocannabinol: NOT DETECTED

## 2015-08-08 LAB — I-STAT TROPONIN, ED: Troponin i, poc: 0.01 ng/mL (ref 0.00–0.08)

## 2015-08-08 LAB — PROTIME-INR
INR: 1.04 (ref 0.00–1.49)
Prothrombin Time: 13.8 seconds (ref 11.6–15.2)

## 2015-08-08 LAB — APTT: aPTT: 32 seconds (ref 24–37)

## 2015-08-08 MED ORDER — BENZONATATE 100 MG PO CAPS: 100.0000 mg | ORAL_CAPSULE | Freq: Three times a day (TID) | ORAL | Status: DC | PRN

## 2015-08-08 MED ORDER — ACETAMINOPHEN 325 MG PO TABS
650.0000 mg | ORAL_TABLET | Freq: Once | ORAL | Status: AC
  Administered 2015-08-08: 650 mg via ORAL
  Filled 2015-08-08: qty 2

## 2015-08-08 MED ORDER — TRAMADOL HCL 50 MG PO TABS: 50.0000 mg | ORAL_TABLET | Freq: Four times a day (QID) | ORAL | Status: DC | PRN

## 2015-08-08 NOTE — ED Provider Notes (Signed)
CSN: [id number]     Arrival date & time 08/08/15  1058 History  By signing my name below, I, Nicole Kindred, attest that this documentation has been prepared under the direction and in the presence of Dorie Rank, MD.   Electronically Signed: Nicole Kindred, ED Scribe. 08/08/2015. 2:28 PM   Chief Complaint  Patient presents with  . Headache    The history is provided by the patient. No language interpreter was used.  HPI Comments: Nicole Cabrera is a 70 y.o. female with extensive PMHx as noted below who presents to the Emergency Department complaining of gradual onset, sore throat, ongoing for four days. Pt reports associated headache, mild speech difficulty, and cough. Patient's husband describes speech issues as difficulty finding certain words. These were simple words that she would know. When she was trying to tell him to get something out of the mailbox. The symptoms have all resolved. Pt has had previous episodes of difficulty with speech and has been evaluated in the ED for similar symptoms. No other associated symptoms noted. No worsening or alleviating factors noted. Pt denies weakness, numbness, fevers, chills, nausea, vomiting, or any other pertinent symptoms.   Past Medical History  Diagnosis Date  . Sleep apnea     CPAP  . Hypertension   . Obesity   . Bronchitis   . Hemorrhoids   . Kidney stones   . Anxiety     xanax  . Shortness of breath     with exertion  . Peripheral neuropathy ([place])   . Arthritis     shoulders/hips - tx with OTC meds  . Breast cyst 03/1990    pt denies 12/05/13  . PMB (postmenopausal bleeding) 04/2000  . Endometrial polyp 04/2000  . Osteopenia 05/2007  . 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 Cervical tab 1972  . SVD (spontaneous vaginal delivery) 04/1964, 05/1978    x3  . SAB (spontaneous abortion) 1981  . Renal stone     seen on [place] 1/16 or 2/16  . Diabetes mellitus without complication [facility])     stated not diabetic but her AIC  has been high so she is monitored closely    Past Surgical History  Procedure Laterality Date  . 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 Total knee arthroplasty Left 4/10    left   . Cesarean section  6/83    with BTL  . Wrist surgery Left 4/07    left  . Cervical disc surgery  2/93  . Hysteroscopy  5/02, 01/2011   Family History  Problem Relation Age of Onset  . Heart disease Father   . Colon cancer Mother   . Heart disease Mother   . Breast cancer Maternal Aunt   . Colon cancer Maternal Aunt   . Colon cancer Maternal Uncle   . Ehlers-Danlos syndrome Other     neice-being tested 4/15   Social History  Substance Use Topics  . Smoking status: Never Smoker   . Smokeless tobacco: Never Used  . Alcohol Use: 0.0 oz/week    0 Standard drinks or equivalent per week     Comment: maybe once a month   OB History    Gravida Para Term Preterm AB TAB SAB Ectopic Multiple Living   5 3   2 1 1   3      Review of Systems  Constitutional: Negative for fever and chills.  HENT: Positive for sore throat.   Respiratory: Positive for cough.  Gastrointestinal: Negative for nausea and vomiting.  Neurological: Positive for speech difficulty and headaches. Negative for weakness and numbness.  All other systems reviewed and are negative. A complete 10 system review of systems was obtained and all systems are negative except as noted in the HPI and PMH.   Allergies  Bactrim; Ciprofloxacin; Keflet; Lisinopril; Spironolactone; Vistaril; Adhesive; Erythromycin; Latex; Penicillins; and Tetracycline  Home Medications   Prior to Admission medications   Medication Sig Start Date End Date Taking? Authorizing Provider  acetaminophen (TYLENOL) 500 MG tablet Take 500 mg by mouth every 6 (six) hours as needed for moderate pain.   Yes Historical Provider, MD  ALPRAZolam Duanne Moron) 0.5 MG tablet Take 0.25 mg by mouth daily as needed for anxiety.  10/27/13  Yes Historical Provider, MD  aspirin EC 81  MG tablet Take 81 mg by mouth 2 (two) times a week. Monday and Thursday.   Yes Historical Provider, MD  cholecalciferol (VITAMIN D) 1000 UNITS tablet Take 1,000 Units by mouth every morning.    Yes Historical Provider, MD  losartan-hydrochlorothiazide (HYZAAR) 100-25 MG per tablet Take 1 tablet by mouth every morning.    Yes Historical Provider, MD  Multiple Vitamins-Minerals (MULTIVITAMIN WITH MINERALS) tablet Take 1 tablet by mouth every morning. Centrum Silver Womens 50+   Yes Historical Provider, MD  Polyethyl Glycol-Propyl Glycol (SYSTANE OP) Place 1-2 drops into both eyes every morning.    Yes Historical Provider, MD  benzonatate (TESSALON) 100 MG capsule Take 1 capsule (100 mg total) by mouth 3 (three) times daily as needed for cough. 08/08/15   Dorie Rank, MD  traMADol (ULTRAM) 50 MG tablet Take 1 tablet (50 mg total) by mouth every 6 (six) hours as needed for moderate pain. 08/08/15   Dorie Rank, MD   BP 161/76 mmHg  Pulse 79  Resp 20  Ht 4\' 11"  (1.499 m)  Wt 250 lb (113.399 kg)  BMI 50.47 kg/m2  SpO2 95%  LMP 01/22/2011 Physical Exam  Constitutional: She is oriented to person, place, and time. She appears well-developed and well-nourished. No distress.  HENT:  Head: Normocephalic and atraumatic.  Right Ear: Tympanic membrane and external ear normal.  Left Ear: Tympanic membrane and external ear normal.  Mouth/Throat: Oropharynx is clear and moist.  Eyes: Conjunctivae are normal. Right eye exhibits no discharge. Left eye exhibits no discharge. No scleral icterus.  Neck: Neck supple. No tracheal deviation present.  Cardiovascular: Normal rate, regular rhythm and intact distal pulses.   Pulmonary/Chest: Effort normal and breath sounds normal. No stridor. No respiratory distress. She has no wheezes. She has no rales.  Abdominal: Soft. Bowel sounds are normal. She exhibits no distension. There is no tenderness. There is no rebound and no guarding.  Musculoskeletal: She exhibits no edema  or tenderness.  Neurological: She is alert and oriented to person, place, and time. She has normal strength. No cranial nerve deficit (No facial droop, extraocular movements intact, tongue midline ) or sensory deficit. She exhibits normal muscle tone. She displays no seizure activity. Coordination normal.  No pronator drift bilateral upper extrem, able to hold both legs off bed for 5 seconds, sensation intact in all extremities, no visual field cuts, no left or right sided neglect, normal finger-nose exam bilaterally, no nystagmus noted   Skin: Skin is warm and dry. No rash noted.  Psychiatric: She has a normal mood and affect.  Nursing note and vitals reviewed.   ED Course  Procedures (including critical care time) DIAGNOSTIC STUDIES: Oxygen Saturation  is 95% on RA, normal by my interpretation.    COORDINATION OF CARE: 12:11 PM Discussed treatment plan which includes CXR with pt at bedside and pt agreed to plan.  Labs Review Labs Reviewed  COMPREHENSIVE METABOLIC PANEL - Abnormal; Notable for the following:    Potassium 3.3 (*)    Chloride 100 (*)    Glucose, Bld 108 (*)    Calcium 8.6 (*)    All other components within normal limits  I-STAT CHEM 8, ED - Abnormal; Notable for the following:    Potassium 3.4 (*)    Chloride 98 (*)    Glucose, Bld 110 (*)    Hemoglobin 15.6 (*)    All other components within normal limits  PROTIME-INR  APTT  CBC  DIFFERENTIAL  URINE RAPID DRUG SCREEN, HOSP PERFORMED  URINALYSIS, ROUTINE W REFLEX MICROSCOPIC (NOT AT [facility])  ETHANOL  I-STAT TROPOININ, ED    Imaging Review Dg Chest 2 View  08/08/2015  CLINICAL DATA:  70 year old female with productive cough for 3 days with sinus congestion. Headache this morning. Initial encounter. EXAM: CHEST  2 VIEW COMPARISON:  09/17/2014 and earlier. FINDINGS: There might be a small to moderate gastric hiatal hernia today, uncertain. Mediastinal contours otherwise are stable. Cardiac size is at the upper  limits of normal. Visualized tracheal air column is within normal limits. No pneumothorax, pulmonary edema, pleural effusion or confluent pulmonary opacity. No acute osseous abnormality identified. IMPRESSION: No acute cardiopulmonary abnormality. Electronically Signed   By: Genevie Ann M.D.   On: 08/08/2015 12:08   Ct Head Wo Contrast  08/08/2015  CLINICAL DATA:  Sinus congestion with productive clear/yellow sputum, cough for 3 days. Headache today. Aphasia earlier today. General malaise. EXAM: CT HEAD WITHOUT CONTRAST TECHNIQUE: Contiguous axial images were obtained from the base of the skull through the vertex without intravenous contrast. COMPARISON:  None. FINDINGS: Brain: There is mild generalized brain atrophy with commensurate dilatation of the ventricles and sulci. Mild chronic small vessel ischemic change noted within the deep periventricular white matter regions bilaterally. There is no mass, hemorrhage, edema or other evidence of acute parenchymal abnormality. No extra-axial hemorrhage. Vascular: No hyperdense vessel or unexpected calcification. Skull: Negative for fracture or focal lesion. Sinuses/Orbits: Visualized upper paranasal sinuses are clear. Visualized orbital and periorbital soft tissues are unremarkable. Other: None. IMPRESSION: 1. No acute findings.  No intracranial mass, hemorrhage or edema. 2. Mild atrophy and mild chronic small vessel ischemic change in the white matter. 3. Visualized upper paranasal sinuses are clear. Electronically Signed   By: Franki Cabot M.D.   On: 08/08/2015 13:42   I have personally reviewed and evaluated these images and lab results as part of my medical decision-making.   EKG Interpretation   Date/Time:  Thursday Aug 08 2015 12:17:33 EDT Ventricular Rate:  83 PR Interval:  216 QRS Duration: 90 QT Interval:  372 QTC Calculation: 437 R Axis:   166 Text Interpretation:  Sinus rhythm Ventricular premature complex , new  since last tracing Borderline  prolonged PR interval Right axis deviation  Low voltage, precordial leads Consider anterior infarct Confirmed by Micheal Sheen   MD-J, Akylah Hascall [id number]) on 08/08/2015 12:21:25 PM      MDM   Final diagnoses:  URI, acute  Acute nonintractable headache, unspecified headache type   Patient's primary issue initially was coughing and congestion. She has no evidence of pneumonia. Laboratory tests and x-rays are reassuring. I suspect this is related to a viral illness. Plan on discharge home with  prescription for Tessalon and Ultram as needed for the chest discomfort associated with her coughing.  Patient describes some speech difficulties. It's possible this could've been an aphasia although not certain. Initial CT scan is unremarkable.I offered to do an MRI in the emergency room to evaluate further for the possibility of TIA or occult stroke. Patient states she does not want to wait in the emergency room and do any more testing right now. She is feeling better and would prefer to follow up with her primary care doctor. I also discussed further TIA type treatment and the patient states she's had some of this in the past. Sounds like she's had carotid ultrasounds at least a couple times previously.  Warning signs and preacautions discussed.  She will continue an ASA.   I personally performed the services described in this documentation, which was scribed in my presence.  The recorded information has been reviewed and is accurate.      Dorie Rank, MD 08/08/15 [phone]

## 2015-08-08 NOTE — ED Notes (Signed)
MD at bedside. 

## 2015-08-08 NOTE — ED Notes (Signed)
PT states she has had sinus congestion with productive clear/yellow sputum cough x3 days. PT states when she woke up this morning and had a headache and difficulty making her words at first which has resolved. Spouse reports episode of aphasia lasted a few minutes. PT denies any fevers but states general malaise and sore throat.

## 2015-08-08 NOTE — ED Notes (Signed)
Pt ambulated to bathroom. Pt has stead gait. No difficulties noted.

## 2015-08-08 NOTE — Discharge Instructions (Signed)
Upper Respiratory Infection, Adult Most upper respiratory infections (URIs) are a viral infection of the air passages leading to the lungs. A URI affects the nose, throat, and upper air passages. The most common type of URI is nasopharyngitis and is typically referred to as "the common cold." URIs run their course and usually go away on their own. Most of the time, a URI does not require medical attention, but sometimes a bacterial infection in the upper airways can follow a viral infection. This is called a secondary infection. Sinus and middle ear infections are common types of secondary upper respiratory infections. Bacterial pneumonia can also complicate a URI. A URI can worsen asthma and chronic obstructive pulmonary disease (COPD). Sometimes, these complications can require emergency medical care and may be life threatening.  CAUSES Almost all URIs are caused by viruses. A virus is a type of germ and can spread from one person to another.  RISKS FACTORS You may be at risk for a URI if:   You smoke.   You have chronic heart or lung disease.  You have a weakened defense (immune) system.   You are very young or very old.   You have nasal allergies or asthma.  You work in crowded or poorly ventilated areas.  You work in health care facilities or schools. SIGNS AND SYMPTOMS  Symptoms typically develop 2-3 days after you come in contact with a cold virus. Most viral URIs last 7-10 days. However, viral URIs from the influenza virus (flu virus) can last 14-18 days and are typically more severe. Symptoms may include:   Runny or stuffy (congested) nose.   Sneezing.   Cough.   Sore throat.   Headache.   Fatigue.   Fever.   Loss of appetite.   Pain in your forehead, behind your eyes, and over your cheekbones (sinus pain).  Muscle aches.  DIAGNOSIS  Your health care provider may diagnose a URI by:  Physical exam.  Tests to check that your symptoms are not due to  another condition such as:  Strep throat.  Sinusitis.  Pneumonia.  Asthma. TREATMENT  A URI goes away on its own with time. It cannot be cured with medicines, but medicines may be prescribed or recommended to relieve symptoms. Medicines may help:  Reduce your fever.  Reduce your cough.  Relieve nasal congestion. HOME CARE INSTRUCTIONS   Take medicines only as directed by your health care provider.   Gargle warm saltwater or take cough drops to comfort your throat as directed by your health care provider.  Use a warm mist humidifier or inhale steam from a shower to increase air moisture. This may make it easier to breathe.  Drink enough fluid to keep your urine clear or pale yellow.   Eat soups and other clear broths and maintain good nutrition.   Rest as needed.   Return to work when your temperature has returned to normal or as your health care provider advises. You may need to stay home longer to avoid infecting others. You can also use a face mask and careful hand washing to prevent spread of the virus.  Increase the usage of your inhaler if you have asthma.   Do not use any tobacco products, including cigarettes, chewing tobacco, or electronic cigarettes. If you need help quitting, ask your health care provider. PREVENTION  The best way to protect yourself from getting a cold is to practice good hygiene.   Avoid oral or hand contact with people with cold   symptoms.   Wash your hands often if contact occurs.  There is no clear evidence that vitamin C, vitamin E, echinacea, or exercise reduces the chance of developing a cold. However, it is always recommended to get plenty of rest, exercise, and practice good nutrition.  SEEK MEDICAL CARE IF:   You are getting worse rather than better.   Your symptoms are not controlled by medicine.   You have chills.  You have worsening shortness of breath.  You have brown or red mucus.  You have yellow or brown nasal  discharge.  You have pain in your face, especially when you bend forward.  You have a fever.  You have swollen neck glands.  You have pain while swallowing.  You have white areas in the back of your throat. SEEK IMMEDIATE MEDICAL CARE IF:   You have severe or persistent:  Headache.  Ear pain.  Sinus pain.  Chest pain.  You have chronic lung disease and any of the following:  Wheezing.  Prolonged cough.  Coughing up blood.  A change in your usual mucus.  You have a stiff neck.  You have changes in your:  Vision.  Hearing.  Thinking.  Mood. MAKE SURE YOU:   Understand these instructions.  Will watch your condition.  Will get help right away if you are not doing well or get worse.   This information is not intended to replace advice given to you by your health care provider. Make sure you discuss any questions you have with your health care provider.   Document Released: 09/02/2000 Document Revised: 07/24/2014 Document Reviewed: 06/14/2013 Elsevier Interactive Patient Education 2016 [employer]  

## 2015-08-29 ENCOUNTER — Ambulatory Visit (INDEPENDENT_AMBULATORY_CARE_PROVIDER_SITE_OTHER): Payer: 59 | Admitting: Internal Medicine

## 2015-08-29 ENCOUNTER — Encounter: Payer: Self-pay | Admitting: Internal Medicine

## 2015-08-29 VITALS — BP 122/74 | HR 69 | Ht 59.0 in | Wt 258.6 lb

## 2015-08-29 DIAGNOSIS — J4541 Moderate persistent asthma with (acute) exacerbation: Secondary | ICD-10-CM | POA: Diagnosis not present

## 2015-08-29 DIAGNOSIS — G4733 Obstructive sleep apnea (adult) (pediatric): Secondary | ICD-10-CM | POA: Diagnosis not present

## 2015-08-29 MED ORDER — AZITHROMYCIN 250 MG PO TABS: ORAL_TABLET | ORAL | Status: DC

## 2015-08-29 MED ORDER — ALBUTEROL SULFATE HFA 108 (90 BASE) MCG/ACT IN AERS: 2.0000 | INHALATION_SPRAY | Freq: Four times a day (QID) | RESPIRATORY_TRACT | Status: DC | PRN

## 2015-08-29 NOTE — Patient Instructions (Signed)
Neb xop 0.63    Dx acute bronchitis  Depo 5  Script sent for Z apk  Suggest Mucinex- DM for cough and congestion  Stay well hydragted  Please call as needed

## 2015-08-29 NOTE — Progress Notes (Signed)
02/27/11- 65 yoF never smoker, followed for obstructive sleep apnea complicated by HBP, history of bronchitis. LOV- 02/28/2010. At last visit we had recommended change from lisinopril because of cough. She was changed to a ARB and her cough resolved. She declines flu vaccine. She reports that she is quite comfortable with CPAP at 11 CWP/Advanced, with good compliance and control. She is using CPAP all night every night and she denies daytime sleepiness.  02/26/12- 65 yoF never smoker, followed for obstructive sleep apnea complicated by HBP, history of bronchitis. FOLLOWS FOR: wears CPAP every night for about 10 hours; new supplies on 03-05-12; pressure working well for patient.  She declines flu vaccine Continues CPAP 11/Advanced all night every night using a nasal mask. She does not need and feels well controlled.  03/17/13- 67 yoF never smoker, followed for obstructive sleep apnea complicated by HBP, history of bronchitis Follows for:  Pt wears CPAP nightly for roughly 8 hours.  Pt has no complaints with her cpap machine, but wishes to change dme providers due to the fact that [facility] is difficult to get in touch with and has given her trouble in the past.    04/07/13-  67 yoF never smoker, followed for obstructive sleep apnea complicated by HBP, history of bronchitis   Husband here Pt states she still has a cold-coughing so hard that she gets fatigued and unable to eat-gets choked. Chest pressure/tightness, wheezing at times. Was unable to eat lunch today due to cough(had salad with vinger dressing). Increased phelgm(stuck feeling) in throat. Spasms of cough when eating. CPAP 11/Advanced  06/27/13- 68 yoF never smoker, followed for obstructive sleep apnea complicated by HBP, history of  Bronchitis CPAP 11/ [facility] doing well. FOLLOWS FOR:had ? flu 1-2 mths ago,has had cough since then-clear,sob with exertion,occass. wheezing,denies cp or tightness,sweats with coughing,,wearing CPAP 8-9  hrs.each nigh,pr. good Residual cough since flu.  CXR 06/11/13 IMPRESSION:  Bibasilar atelectasis.  Electronically Signed  By: Maryclare Bean M.D.  On: 06/11/2013 11:52  01/09/14- 68 yoF never smoker, followed for obstructive sleep apnea complicated by HBP, history of  Bronchitis ACUTE: on 12/11/13 patient had flu-like symptoms. She saw her pcp who gave z-pak on 12/14/13. She had no relief afterwards then on 12/25/13 she was given Cefprozil 500 mg.  She has been taking mucinex fast max,  No chest tightness, some wheezing, clear phlem. CPAP 11/ [place] doing well   02/23/14- 68 yoF never smoker, followed for obstructive sleep apnea complicated by HBP, history of bronchitis FOLLOWS FOR: Slight cough now but not bad. Denies any wheezing, chest congestion, or SOB.  She declines flu shot CPAP 11/ [facility] doing well   Combination of Xopenex nebulizer treatment at last visit while also on Mucinex-D was associated with self-limited tachycardia palpitation. She has not been needing her rescue inhaler since last year and feels clear  09/17/14- .68 yoF never smoker, followed for obstructive sleep apnea complicated by HBP, history of bronchitis CPAP 11/ [place]     Lithotripsy 08/30/14 Pt did follow up with PCP 2 days after ED visit 6/12 for ? pneumonia-told to come back if fever returned.Got Rocephin and Z pak. Pt here to follow up with MD as well for PNA and pulmonary concerns.Pt continues to cough-can not get rid of. Left lower lobe pneumonia treated with Rocephin and Z-Pak. Saw PCP last week. Now no longer has fever and she says she is at her normal baseline chronic cough with throat tickle. No postnasal drip or phlegm.  Not much wheeze. Spine Depo-Medrol helps cough and she asks for that today. Rarely uses rescue inhaler because it causes tachycardia. Nebulizer treatment here with Xopenex last time was associated with malaise. CXR 09/02/14-  IMPRESSION: Chronic bronchitic changes.  Shallower inspiration with increased bibasilar opacities which may represent atelectasis versus left lower lobe pneumonia. Electronically Signed  By: Logan Bores  On: 09/02/2014 16:55  12/20/14-69 yoF never smoker, followed for obstructive sleep apnea complicated by HBP, history of bronchitis CPAP 11/ [place]     Lithotripsy 08/30/14 FOLLOWS FOR: pt. states she wears cpap every night for about 8 hrs. no pressure issues and no supplies needed at this time. did not use ellipta sample. dry cough occa. prod. clear. SOB same.  Wants to keep a rescue inhaler, but hasn't used in a year. Doing very well with CPAP - discussed goals and comfort again. Sleeping well with it all nightr, every night. Wants flu vax later in fall.  06/20/2015-70 year old female never smoker followed for OSA, complicated by HBP, history of bronchitis CPAP 11/ [facility] for: OSA. Pt states that she uses CPAP nightly for about 8 hours nightly. Pt reports no leaks or other problems. Pt's machine does not have an SD card as it is an older machine.  She reports doing very well using CPAP every night. Prefers older machine, not comfortable with the humidifier on the new machine. Takes both with her on trip so that she is sure have 1 that works. Describes consistent use all night every night. Nasal mask. Life is better with CPAP, sleeping well.  08/29/2015-70 year old female never smoker followed for OSA, complicated by HBP, history of bronchitis CPAP 11/ ACUTE VISIT: cough x 4 weeks-productive at times-pale yellow in color; SOB and wheezing as well. Was seen at [facility] few weeks back. ED visit 08/08/2015-treated with Tessalon and Ultram for cough symptoms Her primary physician recently gave 3 days Z-Pak for bronchitis. Sputum is less yellow. No fever but still coughing. CXR 08/08/2015-NAD  ROS-see HPI Constitutional:   No-   weight loss, night sweats, fevers, chills, +fatigue, lassitude. HEENT:   No-   headaches, difficulty swallowing, tooth/dental problems, sore throat,       No-  sneezing, itching, ear ache, nasal congestion, post nasal drip,  CV:  No-   chest pain, orthopnea, PND, swelling in lower extremities, anasarca, dizziness, palpitations Resp: +shortness of breath with exertion or at rest.              No-   productive cough,  + non-productive cough,  No- coughing up of blood.              No-   change in color of mucus.  No- wheezing.   Skin: No-   rash or lesions. GI:  No-   heartburn, indigestion, abdominal pain, nausea, vomiting,  GU: . MS:  No-   joint pain or swelling.  Neuro-     nothing unusual Psych:  No- change in mood or affect. No depression or anxiety.  No memory loss.  OBJ General- Alert, Oriented, Affect-appropriate, Distress- none acute; + quite obese Skin- rash-none, lesions- none, excoriation- none Lymphadenopathy- none Head- atraumatic            Eyes- Gross vision intact, PERRLA, conjunctivae clear secretions            Ears- Hearing, canals-normal            Nose- Clear, no-Septal dev, mucus, polyps, erosion, perforation  Throat- Mallampati III , mucosa clear/ not red , drainage- none, tonsils- atrophic Neck- flexible , trachea midline, no stridor , thyroid nl, carotid no bruit Chest - symmetrical excursion , unlabored           Heart/CV- RRR , no murmur , no gallop  , no rub, nl s1 s2                           - JVD- none , edema- none, stasis changes- none, varices- none           Lung- clear to P&A, wheeze- none, cough+ deep bronchitic , dullness-none, rub- none           Chest wall-  Abd-  Br/ Gen/ Rectal- Not done, not indicated Extrem- cyanosis- none, clubbing, none, atrophy- none, strength- nl, + cane Neuro- grossly intact to observation

## 2015-09-01 NOTE — Assessment & Plan Note (Signed)
Acute exacerbation of chronic bronchitis. She does not smoke. She is not obviously refluxing. Plan-nebulizer Xopenex, Depo-Medrol, hydration, Mucinex, Z-Pak extended because of limited medication tolerance

## 2015-09-01 NOTE — Assessment & Plan Note (Signed)
She describes continued use of CPAP which helps her

## 2015-09-06 MED ORDER — LEVALBUTEROL HCL 0.63 MG/3ML IN NEBU
0.6300 mg | INHALATION_SOLUTION | Freq: Once | RESPIRATORY_TRACT | Status: AC
  Administered 2015-08-29: 0.63 mg via RESPIRATORY_TRACT

## 2015-09-06 MED ORDER — METHYLPREDNISOLONE ACETATE 80 MG/ML IJ SUSP
80.0000 mg | Freq: Once | INTRAMUSCULAR | Status: AC
  Administered 2015-08-29: 80 mg via INTRAMUSCULAR

## 2015-09-06 NOTE — Addendum Note (Signed)
Addended by: Clayborne Dana C on: 09/06/2015 12:01 PM   Modules accepted: Orders

## 2015-09-17 ENCOUNTER — Ambulatory Visit: Payer: 59 | Admitting: Obstetrics & Gynecology

## 2015-11-18 ENCOUNTER — Encounter: Payer: Self-pay | Admitting: Obstetrics & Gynecology

## 2015-11-18 ENCOUNTER — Ambulatory Visit (INDEPENDENT_AMBULATORY_CARE_PROVIDER_SITE_OTHER): Payer: 59 | Admitting: Obstetrics & Gynecology

## 2015-11-18 VITALS — BP 146/84 | HR 82 | Resp 18 | Ht 59.0 in | Wt 258.8 lb

## 2015-11-18 DIAGNOSIS — Z205 Contact with and (suspected) exposure to viral hepatitis: Secondary | ICD-10-CM

## 2015-11-18 DIAGNOSIS — Z0000 Encounter for general adult medical examination without abnormal findings: Secondary | ICD-10-CM

## 2015-11-18 DIAGNOSIS — Z124 Encounter for screening for malignant neoplasm of cervix: Secondary | ICD-10-CM

## 2015-11-18 DIAGNOSIS — Z01419 Encounter for gynecological examination (general) (routine) without abnormal findings: Secondary | ICD-10-CM

## 2015-11-18 LAB — POCT URINALYSIS DIPSTICK
Bilirubin, UA: NEGATIVE
Blood, UA: NEGATIVE
Glucose, UA: NEGATIVE
Ketones, UA: NEGATIVE
Leukocytes, UA: NEGATIVE
Nitrite, UA: NEGATIVE
Protein, UA: NEGATIVE
Urobilinogen, UA: NEGATIVE
pH, UA: 6

## 2015-11-18 MED ORDER — TRIAMCINOLONE ACETONIDE 0.025 % EX OINT: 1.0000 "application " | TOPICAL_OINTMENT | Freq: Two times a day (BID) | CUTANEOUS | 1 refills | Status: DC

## 2015-11-18 NOTE — Progress Notes (Signed)
70 y.o. [id number] Married Caucasian F here for annual exam.  Reports this year she's had issues with cough.  Had pneumonia last June and URI in May.  Both of these were severe enough that she went to the ER for treatment.  Has seen Dr. Annamaria Boots 08/29/15 due to persistent cough.    Had lithotripsy last June due to stones.  Had bleeding before this that she was unsure was urinary.  This has completely resolved.    Last HbA1C was 6.2.  Having some vulvar itching.  Pt attributes this to wearing a pad and having some urinary leaking with cough.  Patient's last menstrual period was 01/22/2011.          Sexually active: No.  The current method of family planning is post menopausal status.    Exercising: No.  The patient does not participate in regular exercise at present. Smoker:  no  Health Maintenance: Pap:  06/29/2013 negative  History of abnormal Pap:  no MMG:  07/10/2013 BIRADS 1 negative  Colonoscopy:  2008 normal.  Pt knows this is overdue and she is aware.  BMD:   2014 heel test-normal   TDaP:  07/22/2002 per our records Pneumonia vaccine(s):  never Zostavax:   never Hep C testing: discuss with provider Screening Labs: had labs done at [facility] in May 2017, Hb today: done at [facility] in May 2017, Urine today: normal    reports that she has never smoked. She has never used smokeless tobacco. She reports that she drinks alcohol. She reports that she does not use drugs.  Past Medical History:  Diagnosis Date  . Anxiety    xanax  . Arthritis    shoulders/hips - tx with OTC meds  . Breast cyst 03/1990   pt denies 12/05/13  . Bronchitis   . Cervical tab 1972  . 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 Diabetes mellitus without complication ([place])    stated not diabetic but her AIC has been high so she is monitored closely   . Endometrial polyp 04/2000  . Hemorrhoids   . Hypertension   . Kidney stones   . Obesity   . Osteopenia 05/2007  . Peripheral neuropathy ([place])   . PMB  (postmenopausal bleeding) 04/2000  . Renal stone    seen on [place] 1/16 or 2/16  . SAB (spontaneous abortion) 1981  . Shortness of breath    with exertion  . Sleep apnea    CPAP  . SVD (spontaneous vaginal delivery) 04/1964, 05/1978   x3    Past Surgical History:  Procedure Laterality Date  . CERVICAL DISC SURGERY  2/93  . CESAREAN SECTION  6/83   with BTL  . HYSTEROSCOPY  5/02, 01/2011  . TOTAL KNEE ARTHROPLASTY Right 6/08   right  . TOTAL KNEE ARTHROPLASTY Left 4/10   left   . WRIST SURGERY Left 4/07   left    Current Outpatient Prescriptions  Medication Sig Dispense Refill  . acetaminophen (TYLENOL) 500 MG tablet Take 500 mg by mouth every 6 (six) hours as needed for moderate pain.    Marland Kitchen albuterol (PROVENTIL HFA;VENTOLIN HFA) 108 (90 Base) MCG/ACT inhaler Inhale 2 puffs into the lungs every 6 (six) hours as needed for wheezing or shortness of breath. 1 Inhaler 6  . ALPRAZolam (XANAX) 0.5 MG tablet Take 0.25 mg by mouth daily as needed for anxiety.     Marland Kitchen aspirin EC 81 MG tablet Take 81 mg by mouth 2 (two) times a week.  Monday and Thursday.    Marland Kitchen azithromycin (ZITHROMAX) 250 MG tablet 2 today then one daily 6 tablet 0  . benzonatate (TESSALON) 100 MG capsule Take 1 capsule (100 mg total) by mouth 3 (three) times daily as needed for cough. 21 capsule 0  . cholecalciferol (VITAMIN D) 1000 UNITS tablet Take 1,000 Units by mouth every morning.     Marland Kitchen losartan-hydrochlorothiazide (HYZAAR) 100-25 MG per tablet Take 1 tablet by mouth every morning.     . Multiple Vitamins-Minerals (MULTIVITAMIN WITH MINERALS) tablet Take 1 tablet by mouth every morning. Centrum Silver Womens 50+    . Polyethyl Glycol-Propyl Glycol (SYSTANE OP) Place 1-2 drops into both eyes every morning.     . traMADol (ULTRAM) 50 MG tablet Take 1 tablet (50 mg total) by mouth every 6 (six) hours as needed for moderate pain. 15 tablet 0   No current facility-administered medications for this visit.     Family History   Problem Relation Age of Onset  . Heart disease Father   . Colon cancer Mother   . Heart disease Mother   . Breast cancer Maternal Aunt   . Colon cancer Maternal Aunt   . Colon cancer Maternal Uncle   . Ehlers-Danlos syndrome Other     neice-being tested 4/15    ROS:  Pertinent items are noted in HPI.  Otherwise, a comprehensive ROS was negative.  Exam:   Vitals:   11/18/15 1334  BP: (!) 146/84  Pulse: 82  Resp: 18  Weight: 258 lb 12.8 oz (117.4 kg)  Height: 4\' 11"  (1.499 m)    General appearance: alert, cooperative and appears stated age Head: Normocephalic, without obvious abnormality, atraumatic Neck: no adenopathy, supple, symmetrical, trachea midline and thyroid normal to inspection and palpation Lungs: clear to auscultation bilaterally Breasts: normal appearance, no masses or tenderness Heart: regular rate and rhythm Abdomen: soft, non-tender; bowel sounds normal; no masses,  no organomegaly Extremities: extremities normal, atraumatic, no cyanosis or edema Skin: Skin color, texture, turgor normal. No rashes or lesions Lymph nodes: Cervical, supraclavicular, and axillary nodes normal. No abnormal inguinal nodes palpated Neurologic: Grossly normal   Pelvic: External genitalia:  Hypopigmentation in symmetric pattern around rectum c/w LSA or LSC              Urethra:  normal appearing urethra with no masses, tenderness or lesions              Bartholins and Skenes: normal                 Vagina: normal appearing vagina with normal color and discharge, no lesions              Cervix: no lesions              Pap taken: Yes.   Bimanual Exam:  Uterus:  normal size, contour, position, consistency, mobility, non-tender              Adnexa: normal adnexa and no mass, fullness, tenderness               Rectovaginal: Confirms               Anus:  normal sphincter tone, no lesions  Chaperone was present for exam.  A:  Well Woman with normal exam PMP, no HRT H/O bleeding  that resolved when pt had lithotripsy 6/16  Borderline DM, being followed conservatively Peripheral neuropathy Morbid obesity Mild SUI Chronic vulvar itching, possibly from LSA or [facility]  P: Mammogram scheduled for pt 4/17 at 9:30am. pap smear obtained Hep C antibody obtained today Triamcinolone 0.25% ointment bid for up to 5 days prn.  #30 gm/1RF  Rx to pharmacy. return annually or prn

## 2015-11-19 LAB — IPS PAP SMEAR ONLY

## 2015-11-19 LAB — HEPATITIS C ANTIBODY: HCV Ab: NEGATIVE

## 2016-06-25 ENCOUNTER — Ambulatory Visit: Payer: 59 | Admitting: Internal Medicine

## 2016-06-29 ENCOUNTER — Ambulatory Visit (HOSPITAL_COMMUNITY)
Admission: RE | Admit: 2016-06-29 | Discharge: 2016-06-29 | Disposition: A | Discharge status: Home / Self Care | Payer: 59 | Source: Ambulatory Visit | Attending: Urology | Admitting: Urology

## 2016-06-29 ENCOUNTER — Other Ambulatory Visit: Payer: Self-pay | Admitting: Urology

## 2016-06-29 DIAGNOSIS — N2889 Other specified disorders of kidney and ureter: Secondary | ICD-10-CM | POA: Insufficient documentation

## 2016-06-29 DIAGNOSIS — R300 Dysuria: Secondary | ICD-10-CM

## 2016-07-31 ENCOUNTER — Ambulatory Visit (INDEPENDENT_AMBULATORY_CARE_PROVIDER_SITE_OTHER): Payer: 59 | Admitting: Urology

## 2016-07-31 DIAGNOSIS — N200 Calculus of kidney: Secondary | ICD-10-CM

## 2016-09-14 ENCOUNTER — Encounter (HOSPITAL_COMMUNITY): Payer: Self-pay | Admitting: Emergency Medicine

## 2016-09-14 ENCOUNTER — Emergency Department (HOSPITAL_COMMUNITY): Payer: 59

## 2016-09-14 ENCOUNTER — Emergency Department (HOSPITAL_COMMUNITY)
Admission: EM | Admit: 2016-09-14 | Discharge: 2016-09-14 | Disposition: A | Discharge status: Home / Self Care | Payer: 59 | Attending: Emergency Medicine | Admitting: Emergency Medicine

## 2016-09-14 DIAGNOSIS — Z91040 Latex allergy status: Secondary | ICD-10-CM | POA: Diagnosis not present

## 2016-09-14 DIAGNOSIS — Z7982 Long term (current) use of aspirin: Secondary | ICD-10-CM | POA: Insufficient documentation

## 2016-09-14 DIAGNOSIS — Z8673 Personal history of transient ischemic attack (TIA), and cerebral infarction without residual deficits: Secondary | ICD-10-CM | POA: Insufficient documentation

## 2016-09-14 DIAGNOSIS — Z96653 Presence of artificial knee joint, bilateral: Secondary | ICD-10-CM | POA: Diagnosis not present

## 2016-09-14 DIAGNOSIS — J45909 Unspecified asthma, uncomplicated: Secondary | ICD-10-CM | POA: Diagnosis not present

## 2016-09-14 DIAGNOSIS — I10 Essential (primary) hypertension: Secondary | ICD-10-CM | POA: Diagnosis not present

## 2016-09-14 DIAGNOSIS — I4891 Unspecified atrial fibrillation: Secondary | ICD-10-CM | POA: Diagnosis not present

## 2016-09-14 DIAGNOSIS — Z79899 Other long term (current) drug therapy: Secondary | ICD-10-CM | POA: Insufficient documentation

## 2016-09-14 DIAGNOSIS — E119 Type 2 diabetes mellitus without complications: Secondary | ICD-10-CM | POA: Diagnosis not present

## 2016-09-14 HISTORY — DX: Transient cerebral ischemic attack, unspecified: G45.9

## 2016-09-14 LAB — CBC WITH DIFFERENTIAL/PLATELET
Basophils Absolute: 0 10*3/uL (ref 0.0–0.1)
Basophils Relative: 0 %
Eosinophils Absolute: 0.2 10*3/uL (ref 0.0–0.7)
Eosinophils Relative: 2 %
HCT: 46.6 % — ABNORMAL HIGH (ref 36.0–46.0)
Hemoglobin: 15.1 g/dL — ABNORMAL HIGH (ref 12.0–15.0)
Lymphocytes Relative: 24 %
Lymphs Abs: 2.5 10*3/uL (ref 0.7–4.0)
MCH: 28.2 pg (ref 26.0–34.0)
MCHC: 32.4 g/dL (ref 30.0–36.0)
MCV: 86.9 fL (ref 78.0–100.0)
Monocytes Absolute: 0.8 10*3/uL (ref 0.1–1.0)
Monocytes Relative: 8 %
Neutro Abs: 6.7 10*3/uL (ref 1.7–7.7)
Neutrophils Relative %: 66 %
Platelets: 260 10*3/uL (ref 150–400)
RBC: 5.36 MIL/uL — ABNORMAL HIGH (ref 3.87–5.11)
RDW: 14.6 % (ref 11.5–15.5)
WBC: 10.2 10*3/uL (ref 4.0–10.5)

## 2016-09-14 LAB — COMPREHENSIVE METABOLIC PANEL
ALT: 18 U/L (ref 14–54)
AST: 23 U/L (ref 15–41)
Albumin: 3.8 g/dL (ref 3.5–5.0)
Alkaline Phosphatase: 82 U/L (ref 38–126)
Anion gap: 10 (ref 5–15)
BUN: 12 mg/dL (ref 6–20)
CO2: 29 mmol/L (ref 22–32)
Calcium: 8.9 mg/dL (ref 8.9–10.3)
Chloride: 99 mmol/L — ABNORMAL LOW (ref 101–111)
Creatinine, Ser: 0.64 mg/dL (ref 0.44–1.00)
GFR calc Af Amer: >60 mL/min (ref >60)
GFR calc non Af Amer: >60 mL/min (ref >60)
Glucose, Bld: 116 mg/dL — ABNORMAL HIGH (ref 65–99)
Potassium: 3.6 mmol/L (ref 3.5–5.1)
Sodium: 138 mmol/L (ref 135–145)
Total Bilirubin: 0.7 mg/dL (ref 0.3–1.2)
Total Protein: 7.1 g/dL (ref 6.5–8.1)

## 2016-09-14 LAB — TSH: TSH: 2.35 u[IU]/mL (ref 0.350–4.500)

## 2016-09-14 LAB — TROPONIN I: Troponin I: <0.03 ng/mL (ref <0.03)

## 2016-09-14 MED ORDER — APIXABAN 5 MG PO TABS
5.0000 mg | ORAL_TABLET | Freq: Once | ORAL | Status: DC
  Filled 2016-09-14: qty 1

## 2016-09-14 MED ORDER — METOPROLOL TARTRATE 25 MG PO TABS
25.0000 mg | ORAL_TABLET | Freq: Once | ORAL | Status: AC
  Administered 2016-09-14: 25 mg via ORAL
  Filled 2016-09-14: qty 1

## 2016-09-14 MED ORDER — METOPROLOL TARTRATE 25 MG PO TABS: 25.0000 mg | ORAL_TABLET | Freq: Two times a day (BID) | ORAL | 0 refills | Status: DC

## 2016-09-14 NOTE — ED Triage Notes (Signed)
Patient sent over by PCP for irregular heart beat. Patient denies symptoms.

## 2016-09-14 NOTE — Progress Notes (Signed)
Patient discussed with Dr Marsa Aris, presents with asymptomatic new diagnosis of afib. Rates 120s, stable bp's. I have recommended starting lopressor 25mg  bid. Her CHADS2Vasc score is 17 (age, gender, DM2, HTN), I have recommended starting eliquis 5mg  bid. I will arrange an outpatient appt for this week or next, she does not require admission at this time. Stop aspirin at home as well since starting eliquis.   Carlyle Dolly MD

## 2016-09-14 NOTE — Discharge Instructions (Signed)
You have been diagnosed with atrial fibrillation. I discussed your case with the cardiologist on call. He recommended a beta blocker as we discussed. Hold the prescription blood thinners for now. You can continue to take your baby aspirin twice a week.  If you do not hear from the cardiologist group by midday on Tuesday, call the number given in your discharge instructions for your appointment time.

## 2016-09-14 NOTE — ED Notes (Signed)
Pt ambulated to [facility]

## 2016-09-14 NOTE — ED Provider Notes (Addendum)
[facility] DEPT Provider Note   CSN: [id number] Arrival date & time: 09/14/16  1108     History   Chief Complaint Chief Complaint  Patient presents with  . Irregular Heart Beat    HPI Nicole Cabrera is a 71 y.o. female.  Patient presented to her primary care doctor in [place] today for a regular checkup and was found to be in atrial fibrillation. She was then sent to the emergency department. No prodromal chest pain, dyspnea, fever, sweats, chills. Past history includes obesity, diabetes, hypertension, sleep apnea. Review systems positive for intermittent vertigo.      Past Medical History:  Diagnosis Date  . Anxiety    xanax  . Arthritis    shoulders/hips - tx with OTC meds  . Breast cyst 03/1990   pt denies 12/05/13  . Bronchitis   . Cervical tab 1972  . 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 Diabetes mellitus without complication ([place])    stated not diabetic but her AIC has been high so she is monitored closely   . Endometrial polyp 04/2000  . Hemorrhoids   . Hypertension   . Kidney stones   . Obesity   . Osteopenia 05/2007  . Peripheral neuropathy   . PMB (postmenopausal bleeding) 04/2000  . Renal stone    seen on [place] 1/16 or 2/16  . SAB (spontaneous abortion) 1981  . Shortness of breath    with exertion  . Sleep apnea    CPAP  . SVD (spontaneous vaginal delivery) 04/1964, 05/1978   x3  . TIA (transient ischemic attack)     Patient Active Problem List   Diagnosis Date Noted  . Obesity 06/20/2015  . Nephrolithiasis 07/09/2014  . Asthma with bronchitis 02/23/2014  . Aphasia 02/27/2013  . ACUTE BRONCHITIS 08/30/2009  . HYPERTENSION 02/22/2007  . Obstructive sleep apnea 02/22/2007  . ALLERGY 02/22/2007    Past Surgical History:  Procedure Laterality Date  . CERVICAL DISC SURGERY  2/93  . CESAREAN SECTION  6/83   with BTL  . HYSTEROSCOPY  5/02, 01/2011  . LITHOTRIPSY  08/30/2014   with [facility] Jeffie Pollock [facility]  . TOTAL KNEE  ARTHROPLASTY Right 6/08   right  . TOTAL KNEE ARTHROPLASTY Left 4/10   left   . WRIST SURGERY Left 4/07   left    OB History    Gravida Para Term Preterm AB Living   5 3     2 3    SAB TAB Ectopic Multiple Live Births   1 1             Home Medications    Prior to Admission medications   Medication Sig Start Date End Date Taking? Authorizing Provider  acetaminophen (TYLENOL) 500 MG tablet Take 500 mg by mouth every 6 (six) hours as needed for moderate pain.   Yes [provider]  albuterol (PROVENTIL HFA;VENTOLIN HFA) 108 (90 Base) MCG/ACT inhaler Inhale 2 puffs into the lungs every 6 (six) hours as needed for wheezing or shortness of breath. 08/29/15  Yes Young, Tarri Fuller D, MD  ALPRAZolam Duanne Moron) 0.5 MG tablet Take 0.25 mg by mouth daily as needed for anxiety.  10/27/13  Yes [provider]  aspirin EC 81 MG tablet Take 81 mg by mouth 2 (two) times a week. Monday and Thursday.   Yes [provider]  cholecalciferol (VITAMIN D) 1000 UNITS tablet Take 1,000 Units by mouth every morning.    Yes [provider]  losartan-hydrochlorothiazide (HYZAAR) 100-25  MG per tablet Take 1 tablet by mouth every morning.    Yes [provider]  Multiple Vitamins-Minerals (MULTIVITAMIN WITH MINERALS) tablet Take 1 tablet by mouth every morning. Centrum Silver Womens 50+   Yes [provider]  Polyethyl Glycol-Propyl Glycol (SYSTANE OP) Place 1-2 drops into both eyes every morning.    Yes [provider]  silver sulfADIAZINE (SILVADENE) 1 % cream APPLY TO SKIN AS DIRECTED ONCE DAILY. 09/09/16  Yes [provider]    Family History Family History  Problem Relation Age of Onset  . Heart disease Father   . Colon cancer Mother   . Heart disease Mother   . Breast cancer Maternal Aunt   . Colon cancer Maternal Aunt   . Colon cancer Maternal Uncle   . Ehlers-Danlos syndrome Other        neice-being tested 4/15    Social  History Social History  Substance Use Topics  . Smoking status: Never Smoker  . Smokeless tobacco: Never Used  . Alcohol use No     Comment: maybe once a month     Allergies   Bactrim [sulfamethoxazole-trimethoprim]; Ciprofloxacin; Keflet [cephalexin]; Lisinopril; Spironolactone; Vistaril [hydroxyzine hcl]; Adhesive [tape]; Erythromycin; Latex; Penicillins; and Tetracycline   Review of Systems Review of Systems  All other systems reviewed and are negative.    Physical Exam Updated Vital Signs BP (!) 119/93 (BP Location: Right Arm)   Pulse 94   Temp 98.3 F (36.8 C) (Oral)   Resp 17   Ht 4\' 11"  (1.499 m)   Wt 110.2 kg (243 lb)   LMP 01/22/2011   SpO2 96%   BMI 49.08 kg/m   Physical Exam  Constitutional: She is oriented to person, place, and time. She appears well-developed and well-nourished.  Obese  HENT:  Head: Normocephalic and atraumatic.  Eyes: Conjunctivae are normal.  Neck: Neck supple.  Cardiovascular:  Tachycardic, irregularly irregular  Pulmonary/Chest: Effort normal and breath sounds normal.  Abdominal: Soft. Bowel sounds are normal.  Musculoskeletal: Normal range of motion.  Neurological: She is alert and oriented to person, place, and time.  Skin: Skin is warm and dry.  Psychiatric: She has a normal mood and affect. Her behavior is normal.  Nursing note and vitals reviewed.    ED Treatments / Results  Labs (all labs ordered are listed, but only abnormal results are displayed) Labs Reviewed  CBC WITH DIFFERENTIAL/PLATELET - Abnormal; Notable for the following:       Result Value   RBC 5.36 (*)    Hemoglobin 15.1 (*)    HCT 46.6 (*)    All other components within normal limits  COMPREHENSIVE METABOLIC PANEL - Abnormal; Notable for the following:    Chloride 99 (*)    Glucose, Bld 116 (*)    All other components within normal limits  TROPONIN I    EKG  EKG Interpretation  Date/Time:  Monday September 14 2016 11:26:05 EDT Ventricular Rate:   119 PR Interval:    QRS Duration: 76 QT Interval:  314 QTC Calculation: 441 R Axis:   -173 Text Interpretation:  Undetermined rhythm Right superior axis deviation Low voltage QRS Cannot rule out Anterior infarct , age undetermined Abnormal ECG Confirmed by Nat Christen [id number]) on 09/14/2016 12:41:51 PM       Radiology Dg Chest 2 View  Result Date: 09/14/2016 CLINICAL DATA:  Atrial fibrillation. EXAM: CHEST  2 VIEW COMPARISON:  Chest x-ray dated 08/08/2015. FINDINGS: Borderline cardiomegaly is stable. Overall cardiomediastinal silhouette is  stable. Lungs are clear. No pleural effusion or pneumothorax seen. Lungs are at least mildly hyperexpanded. Mild degenerative change throughout the thoracic spine. No acute or suspicious osseous finding. IMPRESSION: 1. No active cardiopulmonary disease. No evidence of pneumonia or pulmonary edema. 2. Hyperexpanded lungs suggesting COPD. 3. Borderline cardiomegaly is stable. Electronically Signed   By: Franki Cabot M.D.   On: 09/14/2016 11:50    Procedures Procedures (including critical care time)  Medications Ordered in ED Medications - No data to display   Initial Impression / Assessment and Plan / ED Course  I have reviewed the triage vital signs and the nursing notes.  Pertinent labs & imaging results that were available during my care of the patient were reviewed by me and considered in my medical decision making (see chart for details).     Patient is in atrial fibrillation and hemodynamically stable. Will discuss with cardiology.   1400:  Discussed with Dr. Harl Bowie. Will start Lopressor 25 mg twice a day. Will hold Eliquis secondary to episode of excessive bleeding in the past. Patient takes a baby aspirin twice a week. This was discussed with the patient and her husband. Follow-up with cardiology next week. Final Clinical Impressions(s) / ED Diagnoses   Final diagnoses:  Atrial fibrillation, unspecified type [facility])    New  Prescriptions New Prescriptions   No medications on file     Nat Christen, MD 09/14/16 [place], MD 09/14/16 [phone]

## 2016-09-21 ENCOUNTER — Ambulatory Visit (INDEPENDENT_AMBULATORY_CARE_PROVIDER_SITE_OTHER): Payer: 59 | Admitting: Cardiology

## 2016-09-21 ENCOUNTER — Encounter: Payer: Self-pay | Admitting: Cardiology

## 2016-09-21 VITALS — BP 128/78 | HR 63 | Ht 59.0 in | Wt 249.0 lb

## 2016-09-21 DIAGNOSIS — R011 Cardiac murmur, unspecified: Secondary | ICD-10-CM | POA: Diagnosis not present

## 2016-09-21 DIAGNOSIS — I480 Paroxysmal atrial fibrillation: Secondary | ICD-10-CM

## 2016-09-21 NOTE — Patient Instructions (Signed)
Your physician recommends that you schedule a follow-up appointment in:  4 Months  Your physician recommends that you continue on your current medications as directed. Please refer to the Current Medication list given to you today.  Your physician has requested that you have an echocardiogram. Echocardiography is a painless test that uses sound waves to create images of your heart. It provides your doctor with information about the size and shape of your heart and how well your heart's chambers and valves are working. This procedure takes approximately one hour. There are no restrictions for this procedure.   If you need a refill on your cardiac medications before your next appointment, please call your pharmacy.  Thank you for choosing [facility]!

## 2016-09-21 NOTE — Progress Notes (Signed)
Clinical Summary Nicole Cabrera is a 71 y.o.female seen as new patient, she is referred by Dr Scotty Court for the following medical problems.   1. Afib - new diagnosis during recent ER visit 09/14/16 - she denies any palpitations, previously felt shaky when in afib with RVR while in ER - started lopressor, some fatigue initially but resolving.  - history of nose bleed in 2003, required packign and several day hospital stay. She is hesitant to consider anticoagulation.   - CHAD2Vasc score is at least 3 (gender, age, HTN). She is borderline DM2, she reports prior TIA but workup unremarkable at the time.    2. OSA - uses CPAP machine, followed by Dr Annamaria Boots.     Past Medical History:  Diagnosis Date  . Anxiety    xanax  . Arthritis    shoulders/hips - tx with OTC meds  . Breast cyst 03/1990   pt denies 12/05/13  . Bronchitis   . Cervical tab 1972  . 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 Diabetes mellitus without complication ([place])    stated not diabetic but her AIC has been high so she is monitored closely   . Endometrial polyp 04/2000  . Hemorrhoids   . Hypertension   . Kidney stones   . Obesity   . Osteopenia 05/2007  . Peripheral neuropathy   . PMB (postmenopausal bleeding) 04/2000  . Renal stone    seen on [place] 1/16 or 2/16  . SAB (spontaneous abortion) 1981  . Shortness of breath    with exertion  . Sleep apnea    CPAP  . SVD (spontaneous vaginal delivery) 04/1964, 05/1978   x3  . TIA (transient ischemic attack)      Allergies  Allergen Reactions  . Bactrim [Sulfamethoxazole-Trimethoprim] Other (See Comments)    Unknown   . Ciprofloxacin Other (See Comments)    GI upset  . Keflet [Cephalexin] Other (See Comments)    GI upset  . Lisinopril Cough  . Spironolactone Nausea Only  . Vistaril [Hydroxyzine Hcl] Other (See Comments)    Pt becomes physchotic.  . Adhesive [Tape] Itching, Rash and Other (See Comments)    Burning  . Erythromycin Rash  . Latex Rash   . Penicillins Rash    Has patient had a PCN reaction causing immediate rash, facial/tongue/throat swelling, SOB or lightheadedness with hypotension: Yes Has patient had a PCN reaction causing severe rash involving mucus membranes or skin necrosis: No Has patient had a PCN reaction that required hospitalization No Has patient had a PCN reaction occurring within the last 10 years: No If all of the above answers are "NO", then may proceed with Cephalosporin use.   . Tetracycline Rash     Current Outpatient Prescriptions  Medication Sig Dispense Refill  . acetaminophen (TYLENOL) 500 MG tablet Take 500 mg by mouth every 6 (six) hours as needed for moderate pain.    Marland Kitchen albuterol (PROVENTIL HFA;VENTOLIN HFA) 108 (90 Base) MCG/ACT inhaler Inhale 2 puffs into the lungs every 6 (six) hours as needed for wheezing or shortness of breath. 1 Inhaler 6  . ALPRAZolam (XANAX) 0.5 MG tablet Take 0.25 mg by mouth daily as needed for anxiety.     Marland Kitchen aspirin EC 81 MG tablet Take 81 mg by mouth 2 (two) times a week. Monday and Thursday.    . cholecalciferol (VITAMIN D) 1000 UNITS tablet Take 1,000 Units by mouth every morning.     Marland Kitchen losartan-hydrochlorothiazide (HYZAAR) 100-25 MG per tablet Take  1 tablet by mouth every morning.     . metoprolol tartrate (LOPRESSOR) 25 MG tablet Take 1 tablet (25 mg total) by mouth 2 (two) times daily. 60 tablet 0  . Multiple Vitamins-Minerals (MULTIVITAMIN WITH MINERALS) tablet Take 1 tablet by mouth every morning. Centrum Silver Womens 50+    . Polyethyl Glycol-Propyl Glycol (SYSTANE OP) Place 1-2 drops into both eyes every morning.     . silver sulfADIAZINE (SILVADENE) 1 % cream APPLY TO SKIN AS DIRECTED ONCE DAILY.  0   No current facility-administered medications for this visit.      Past Surgical History:  Procedure Laterality Date  . CERVICAL DISC SURGERY  2/93  . CESAREAN SECTION  6/83   with BTL  . HYSTEROSCOPY  5/02, 01/2011  . LITHOTRIPSY  08/30/2014   with  Dr. Jeffie Pollock at [facility]  . TOTAL KNEE ARTHROPLASTY Right 6/08   right  . TOTAL KNEE ARTHROPLASTY Left 4/10   left   . WRIST SURGERY Left 4/07   left     Allergies  Allergen Reactions  . Bactrim [Sulfamethoxazole-Trimethoprim] Other (See Comments)    Unknown   . Ciprofloxacin Other (See Comments)    GI upset  . Keflet [Cephalexin] Other (See Comments)    GI upset  . Lisinopril Cough  . Spironolactone Nausea Only  . Vistaril [Hydroxyzine Hcl] Other (See Comments)    Pt becomes physchotic.  . Adhesive [Tape] Itching, Rash and Other (See Comments)    Burning  . Erythromycin Rash  . Latex Rash  . Penicillins Rash    Has patient had a PCN reaction causing immediate rash, facial/tongue/throat swelling, SOB or lightheadedness with hypotension: Yes Has patient had a PCN reaction causing severe rash involving mucus membranes or skin necrosis: No Has patient had a PCN reaction that required hospitalization No Has patient had a PCN reaction occurring within the last 10 years: No If all of the above answers are "NO", then may proceed with Cephalosporin use.   . Tetracycline Rash      Family History  Problem Relation Age of Onset  . Heart disease Father   . Colon cancer Mother   . Heart disease Mother   . Breast cancer Maternal Aunt   . Colon cancer Maternal Aunt   . Colon cancer Maternal Uncle   . Ehlers-Danlos syndrome Other        neice-being tested 4/15     Social History Nicole Cabrera reports that she has never smoked. She has never used smokeless tobacco. Nicole Cabrera reports that she does not drink alcohol.   Review of Systems CONSTITUTIONAL: No weight loss, fever, chills, weakness or fatigue.  HEENT: Eyes: No visual loss, blurred vision, double vision or yellow sclerae.No hearing loss, sneezing, congestion, runny nose or sore throat.  SKIN: No rash or itching.  CARDIOVASCULAR: per hpi RESPIRATORY: No shortness of breath, cough or sputum.  GASTROINTESTINAL: No  anorexia, nausea, vomiting or diarrhea. No abdominal pain or blood.  GENITOURINARY: No burning on urination, no polyuria NEUROLOGICAL: No headache, dizziness, syncope, paralysis, ataxia, numbness or tingling in the extremities. No change in bowel or bladder control.  MUSCULOSKELETAL: No muscle, back pain, joint pain or stiffness.  LYMPHATICS: No enlarged nodes. No history of splenectomy.  PSYCHIATRIC: No history of depression or anxiety.  ENDOCRINOLOGIC: No reports of sweating, cold or heat intolerance. No polyuria or polydipsia.  Marland Kitchen   Physical Examination Vitals:   09/21/16 0941 09/21/16 0943  BP: 122/72 128/78  Pulse: 63  Vitals:   09/21/16 0941  Weight: 249 lb (112.9 kg)  Height: 4\' 11"  (1.499 m)    Gen: resting comfortably, no acute distress HEENT: no scleral icterus, pupils equal round and reactive, no palptable cervical adenopathy,  CV: RRR, 2/6 systolic murmur RUSB, no jvd Resp: Clear to auscultation bilaterally GI: abdomen is soft, non-tender, non-distended, normal bowel sounds, no hepatosplenomegaly MSK: extremities are warm, no edema.  Skin: warm, no rash Neuro:  no focal deficits Psych: appropriate affect      Assessment and Plan  1. PAF - EKG today shows sinus bradycardia - continue rate control with lopressor - she remains resistant to starting anticoagulation due to severe nose bleed in the past. She is to contact her ENT doctor for follow up and to get there opinion and then contact us back with her decision - obtain echo  2. Heart murmur - obtain echo  3. HTN - at goal, continue current meds  F/u 4 months    Arnoldo Lenis, M.D

## 2016-09-28 ENCOUNTER — Ambulatory Visit (HOSPITAL_COMMUNITY)
Admission: RE | Admit: 2016-09-28 | Discharge: 2016-09-28 | Disposition: A | Discharge status: Home / Self Care | Payer: 59 | Source: Ambulatory Visit | Attending: Cardiology | Admitting: Cardiology

## 2016-09-28 DIAGNOSIS — I480 Paroxysmal atrial fibrillation: Secondary | ICD-10-CM

## 2016-09-28 DIAGNOSIS — I420 Dilated cardiomyopathy: Secondary | ICD-10-CM | POA: Diagnosis not present

## 2016-09-28 DIAGNOSIS — I5030 Unspecified diastolic (congestive) heart failure: Secondary | ICD-10-CM | POA: Insufficient documentation

## 2016-09-28 DIAGNOSIS — I083 Combined rheumatic disorders of mitral, aortic and tricuspid valves: Secondary | ICD-10-CM | POA: Insufficient documentation

## 2016-09-28 NOTE — Progress Notes (Signed)
*  PRELIMINARY RESULTS* Echocardiogram 2D Echocardiogram has been performed.  Leavy Cella 09/28/2016, 12:08 PM

## 2016-09-30 ENCOUNTER — Other Ambulatory Visit: Payer: Self-pay | Admitting: Family Medicine

## 2016-09-30 ENCOUNTER — Telehealth: Payer: Self-pay | Admitting: Internal Medicine

## 2016-09-30 DIAGNOSIS — Z1231 Encounter for screening mammogram for malignant neoplasm of breast: Secondary | ICD-10-CM

## 2016-09-30 NOTE — Telephone Encounter (Signed)
Contacted patient and left message to bring CPAP machine with cord to 10/01/16 appointment with CY. Nothing further is needed.

## 2016-10-01 ENCOUNTER — Encounter: Payer: Self-pay | Admitting: Internal Medicine

## 2016-10-01 ENCOUNTER — Ambulatory Visit (INDEPENDENT_AMBULATORY_CARE_PROVIDER_SITE_OTHER): Payer: 59 | Admitting: Internal Medicine

## 2016-10-01 DIAGNOSIS — J45909 Unspecified asthma, uncomplicated: Secondary | ICD-10-CM

## 2016-10-01 DIAGNOSIS — G4733 Obstructive sleep apnea (adult) (pediatric): Secondary | ICD-10-CM | POA: Diagnosis not present

## 2016-10-01 NOTE — Progress Notes (Signed)
HPI female never smoker followed for OSA, complicated by HBP, history of bronchitis NPSG 07/12/85-AHI 50.7/hour, desaturation to 84%  ---------------------------------------------------------------------------------------------------------  06/20/2015-71 year old female never smoker followed for OSA, complicated by HBP, history of bronchitis CPAP 11/ [facility] for: OSA. Pt states that she uses CPAP nightly for about 8 hours nightly. Pt reports no leaks or other problems. Pt's machine does not have an SD card as it is an older machine.  She reports doing very well using CPAP every night. Prefers older machine, not comfortable with the humidifier on the new machine. Takes both with her on trip so that she is sure have 1 that works. Describes consistent use all night every night. Nasal mask. Life is better with CPAP, sleeping well.  08/29/2015-71 year old female never smoker followed for OSA, complicated by HBP, history of bronchitis CPAP 11/ ACUTE VISIT: cough x 4 weeks-productive at times-pale yellow in color; SOB and wheezing as well. Was seen at [facility] few weeks back. ED visit 08/08/2015-treated with Tessalon and Ultram for cough symptoms Her primary physician recently gave 3 days Z-Pak for bronchitis. Sputum is less yellow. No fever but still coughing. CXR 08/08/2015-NAD  10/01/16- 71 ear-old female never smoker followed for OSA, complicated by HBP, history of bronchitis CPAP 11/ [facility]- no download available She is using an older machine which she prefers over a newer one. We discussed replacement criteria. Pressure is comfortable. She uses it all night every night. Download is not available with this machine. She denies any breathing problems, unusual shortness of breath, cough, wheeze, palpitations or chest pain. CXR 09/14/16 IMPRESSION: 1. No active cardiopulmonary disease. No evidence of pneumonia or pulmonary edema. 2. Hyperexpanded lungs suggesting COPD. 3.  Borderline cardiomegaly is stable.  ROS-see HPI Constitutional:   No-   weight loss, night sweats, fevers, chills, +fatigue, lassitude. HEENT:   No-  headaches, difficulty swallowing, tooth/dental problems, sore throat,       No-  sneezing, itching, ear ache, nasal congestion, post nasal drip,  CV:  No-   chest pain, orthopnea, PND, swelling in lower extremities, anasarca, dizziness, palpitations Resp: +shortness of breath with exertion or at rest.              No-   productive cough,  + non-productive cough,  No- coughing up of blood.              No-   change in color of mucus.  No- wheezing.   Skin: No-   rash or lesions. GI:  No-   heartburn, indigestion, abdominal pain, nausea, vomiting,  GU: . MS:  No-   joint pain or swelling.  Neuro-     nothing unusual Psych:  No- change in mood or affect. No depression or anxiety.  No memory loss.  OBJ   stable baseline exam General- Alert, Oriented, Affect-appropriate, Distress- none acute; + quite obese Skin- rash-none, lesions- none, excoriation- none Lymphadenopathy- none Head- atraumatic            Eyes- Gross vision intact, PERRLA, conjunctivae clear secretions            Ears- Hearing, canals-normal            Nose- Clear, no-Septal dev, mucus, polyps, erosion, perforation             Throat- Mallampati III , mucosa clear/ not red , drainage- none, tonsils- atrophic Neck- flexible , trachea midline, no stridor , thyroid nl, carotid no bruit Chest - symmetrical excursion , unlabored  Heart/CV- RRR , no murmur , no gallop  , no rub, nl s1 s2                           - JVD- none , edema- none, stasis changes- none, varices- none           Lung- clear to P&A, wheeze- none, cough+ deep bronchitic , dullness-none, rub- none           Chest wall-  Abd-  Br/ Gen/ Rectal- Not done, not indicated Extrem- cyanosis- none, clubbing, none, atrophy- none, strength- nl, + cane Neuro- grossly intact to observation

## 2016-10-01 NOTE — Patient Instructions (Signed)
We can continue CPAP 11, mask of choice, humidifier, supplies, and download or AirView if available   Dx OSA  Please call if we can help

## 2016-10-01 NOTE — Assessment & Plan Note (Signed)
She is quite satisfied to continue current settings, reporting excellent compliance and control. We discussed replacing an older machine when she is ready. Weight loss remains  goal.

## 2016-10-01 NOTE — Assessment & Plan Note (Signed)
Mild intermittent asthma/bronchitis, uncomplicated without recent exacerbation. She does not routinely need inhalers.

## 2016-10-02 ENCOUNTER — Ambulatory Visit
Admission: RE | Admit: 2016-10-02 | Discharge: 2016-10-02 | Disposition: A | Discharge status: Home / Self Care | Payer: 59 | Source: Ambulatory Visit | Attending: Family Medicine | Admitting: Family Medicine

## 2016-10-02 DIAGNOSIS — Z1231 Encounter for screening mammogram for malignant neoplasm of breast: Secondary | ICD-10-CM

## 2016-10-15 ENCOUNTER — Other Ambulatory Visit: Payer: Self-pay

## 2016-10-15 MED ORDER — METOPROLOL TARTRATE 25 MG PO TABS: 25.0000 mg | ORAL_TABLET | Freq: Two times a day (BID) | ORAL | 6 refills | Status: DC

## 2016-10-27 DIAGNOSIS — I4891 Unspecified atrial fibrillation: Secondary | ICD-10-CM | POA: Insufficient documentation

## 2017-01-05 ENCOUNTER — Encounter: Payer: Self-pay | Admitting: Cardiology

## 2017-01-05 ENCOUNTER — Ambulatory Visit (INDEPENDENT_AMBULATORY_CARE_PROVIDER_SITE_OTHER): Payer: 59 | Admitting: Cardiology

## 2017-01-05 VITALS — BP 158/88 | HR 67 | Ht 59.5 in | Wt 249.0 lb

## 2017-01-05 DIAGNOSIS — I480 Paroxysmal atrial fibrillation: Secondary | ICD-10-CM | POA: Diagnosis not present

## 2017-01-05 DIAGNOSIS — I10 Essential (primary) hypertension: Secondary | ICD-10-CM | POA: Diagnosis not present

## 2017-01-05 NOTE — Progress Notes (Signed)
Clinical Summary Ms. Westcott is a 71 y.o.female seen today for follow up of the following medical problems.   1. Afib - new diagnosis during  ER visit 09/14/16 - she denies any palpitations, previously felt shaky when in afib with RVR while in ER - started lopressor, some fatigue initially but resolving.  - history of nose bleed in 2003, required packign and several day hospital stay. She is hesitant to consider anticoagulation.   - CHAD2Vasc score is at least 3 (gender, age, HTN). She is borderline DM2, she reports prior TIA but workup unremarkable at the time.   - she reports ENT doctor was ok with starting anticoag.  - she remains hesitant to start at this time.    2. OSA - uses CPAP machine, followed by Dr Annamaria Boots.    3. HTN - well controlled by recent clinic visits though elevated today    Past Medical History:  Diagnosis Date  . Anxiety    xanax  . Arthritis    shoulders/hips - tx with OTC meds  . Breast cyst 03/1990   pt denies 12/05/13  . Bronchitis   . Cervical tab 1972  . 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 Diabetes mellitus without complication ([place])    stated not diabetic but her AIC has been high so she is monitored closely   . Endometrial polyp 04/2000  . Hemorrhoids   . Hypertension   . Kidney stones   . Obesity   . Osteopenia 05/2007  . Peripheral neuropathy   . PMB (postmenopausal bleeding) 04/2000  . Renal stone    seen on [place] 1/16 or 2/16  . SAB (spontaneous abortion) 1981  . Shortness of breath    with exertion  . Sleep apnea    CPAP  . SVD (spontaneous vaginal delivery) 04/1964, 05/1978   x3  . TIA (transient ischemic attack)      Allergies  Allergen Reactions  . Bactrim [Sulfamethoxazole-Trimethoprim] Other (See Comments)    Unknown   . Ciprofloxacin Other (See Comments)    GI upset  . Keflet [Cephalexin] Other (See Comments)    GI upset  . Lisinopril Cough  . Spironolactone Nausea Only  . Vistaril [Hydroxyzine Hcl]  Other (See Comments)    Pt becomes physchotic.  . Adhesive [Tape] Itching, Rash and Other (See Comments)    Burning  . Erythromycin Rash  . Latex Rash  . Penicillins Rash    Has patient had a PCN reaction causing immediate rash, facial/tongue/throat swelling, SOB or lightheadedness with hypotension: Yes Has patient had a PCN reaction causing severe rash involving mucus membranes or skin necrosis: No Has patient had a PCN reaction that required hospitalization No Has patient had a PCN reaction occurring within the last 10 years: No If all of the above answers are "NO", then may proceed with Cephalosporin use.   . Tetracycline Rash     Current Outpatient Prescriptions  Medication Sig Dispense Refill  . acetaminophen (TYLENOL) 500 MG tablet Take 500 mg by mouth every 6 (six) hours as needed for moderate pain.    Marland Kitchen albuterol (PROVENTIL HFA;VENTOLIN HFA) 108 (90 Base) MCG/ACT inhaler Inhale 2 puffs into the lungs every 6 (six) hours as needed for wheezing or shortness of breath. 1 Inhaler 6  . ALPRAZolam (XANAX) 0.5 MG tablet Take 0.25 mg by mouth daily as needed for anxiety.     Marland Kitchen aspirin EC 81 MG tablet Take 81 mg by mouth 2 (two) times a week. Monday  and Thursday.    . cholecalciferol (VITAMIN D) 1000 UNITS tablet Take 1,000 Units by mouth every morning.     Marland Kitchen losartan-hydrochlorothiazide (HYZAAR) 100-25 MG per tablet Take 1 tablet by mouth every morning.     . metoprolol tartrate (LOPRESSOR) 25 MG tablet Take 1 tablet (25 mg total) by mouth 2 (two) times daily. 60 tablet 6  . Multiple Vitamins-Minerals (MULTIVITAMIN WITH MINERALS) tablet Take 1 tablet by mouth every morning. Centrum Silver Womens 50+    . Polyethyl Glycol-Propyl Glycol (SYSTANE OP) Place 1-2 drops into both eyes every morning.     . silver sulfADIAZINE (SILVADENE) 1 % cream APPLY TO SKIN AS DIRECTED ONCE DAILY.  0   No current facility-administered medications for this visit.      Past Surgical History:  Procedure  Laterality Date  . CERVICAL DISC SURGERY  2/93  . CESAREAN SECTION  6/83   with BTL  . HYSTEROSCOPY  5/02, 01/2011  . LITHOTRIPSY  08/30/2014   with Dr. Jeffie Pollock at [facility]  . TOTAL KNEE ARTHROPLASTY Right 6/08   right  . TOTAL KNEE ARTHROPLASTY Left 4/10   left   . WRIST SURGERY Left 4/07   left     Allergies  Allergen Reactions  . Bactrim [Sulfamethoxazole-Trimethoprim] Other (See Comments)    Unknown   . Ciprofloxacin Other (See Comments)    GI upset  . Keflet [Cephalexin] Other (See Comments)    GI upset  . Lisinopril Cough  . Spironolactone Nausea Only  . Vistaril [Hydroxyzine Hcl] Other (See Comments)    Pt becomes physchotic.  . Adhesive [Tape] Itching, Rash and Other (See Comments)    Burning  . Erythromycin Rash  . Latex Rash  . Penicillins Rash    Has patient had a PCN reaction causing immediate rash, facial/tongue/throat swelling, SOB or lightheadedness with hypotension: Yes Has patient had a PCN reaction causing severe rash involving mucus membranes or skin necrosis: No Has patient had a PCN reaction that required hospitalization No Has patient had a PCN reaction occurring within the last 10 years: No If all of the above answers are "NO", then may proceed with Cephalosporin use.   . Tetracycline Rash      Family History  Problem Relation Age of Onset  . Heart disease Father   . Colon cancer Mother   . Heart disease Mother   . Breast cancer Maternal Aunt   . Colon cancer Maternal Aunt   . Colon cancer Maternal Uncle   . Ehlers-Danlos syndrome Other        neice-being tested 4/15     Social History Ms. Pescador reports that she has never smoked. She has never used smokeless tobacco. Ms. Veillon reports that she does not drink alcohol.   Review of Systems CONSTITUTIONAL: No weight loss, fever, chills, weakness or fatigue.  HEENT: Eyes: No visual loss, blurred vision, double vision or yellow sclerae.No hearing loss, sneezing, congestion, runny nose  or sore throat.  SKIN: No rash or itching.  CARDIOVASCULAR: no chest pain, no palpitations.  RESPIRATORY: No shortness of breath, cough or sputum.  GASTROINTESTINAL: No anorexia, nausea, vomiting or diarrhea. No abdominal pain or blood.  GENITOURINARY: No burning on urination, no polyuria NEUROLOGICAL: No headache, dizziness, syncope, paralysis, ataxia, numbness or tingling in the extremities. No change in bowel or bladder control.  MUSCULOSKELETAL: No muscle, back pain, joint pain or stiffness.  LYMPHATICS: No enlarged nodes. No history of splenectomy.  PSYCHIATRIC: No history of depression or anxiety.  ENDOCRINOLOGIC: No reports of sweating, cold or heat intolerance. No polyuria or polydipsia.  Marland Kitchen   Physical Examination Vitals:   01/05/17 1525  BP: (!) 158/88  Pulse: 67  SpO2: 91%   Vitals:   01/05/17 1525  Weight: 249 lb (112.9 kg)  Height: 4' 11.5" (1.511 m)    Gen: resting comfortably, no acute distress HEENT: no scleral icterus, pupils equal round and reactive, no palptable cervical adenopathy,  CV: RRR, no m/rg, no jvd Resp: Clear to auscultation bilaterally GI: abdomen is soft, non-tender, non-distended, normal bowel sounds, no hepatosplenomegaly MSK: extremities are warm, no edema.  Skin: warm, no rash Neuro:  no focal deficits Psych: appropriate affect   Diagnostic Studies 09/2016 echo Study Conclusions  - Left ventricle: The cavity size was normal. Wall thickness was   increased in a pattern of mild LVH. Systolic function was normal.   The estimated ejection fraction was in the range of 60% to 65%.   Wall motion was normal; there were no regional wall motion   abnormalities. Features are consistent with a pseudonormal left   ventricular filling pattern, with concomitant abnormal relaxation   and increased filling pressure (grade 2 diastolic dysfunction). - Aortic valve: Mildly calcified annulus. Trileaflet. There was   trivial regurgitation. - Mitral  valve: Moderately calcified annulus. The findings are   consistent with mild stenosis. There was mild regurgitation. Mean   gradient (D): 3 mm Hg. Valve area by pressure half-time: 1.54   cm^2. - Left atrium: The atrium was moderately dilated. - Right atrium: Central venous pressure (est): 8 mm Hg. - Tricuspid valve: There was trivial regurgitation. - Pulmonary arteries: Systolic pressure could not be accurately   estimated. - Pericardium, extracardiac: There was no pericardial effusion.  Impressions:  - Mild LVH with LVEF 60-65% and grade 2 diastolic dysfunction.   Moderate left atrial enlargement. Moderately calcified mitral   annulus with evidence of mild mitral regurgitation and mild   mitral stenosis. Sclerotic aortic valve with trivial aortic   regurgitation. Trivial tricuspid regurgitation.    Assessment and Plan  1. PAF - no symptoms, continue current meds - she remains hesitant about starting anticoagulation. We spoke in detail about the risk of stroke associated with afib and the benefits of anticoagulation in lowering that risk. Initial concern was about risk of nosebleeds which she had before, she has been cleared by ENT to try anticoag. Asked her to call use if she decides to start  2. HTN - elevated in clinic, has been well controlled at recent other visits. Continue to monitor at this time.   F/u 6 months       Arnoldo Lenis, M.D

## 2017-01-05 NOTE — Patient Instructions (Signed)

## 2017-01-08 ENCOUNTER — Telehealth: Payer: Self-pay | Admitting: Cardiology

## 2017-01-14 ENCOUNTER — Other Ambulatory Visit: Payer: Self-pay | Admitting: Orthopedic Surgery

## 2017-01-14 ENCOUNTER — Ambulatory Visit (INDEPENDENT_AMBULATORY_CARE_PROVIDER_SITE_OTHER): Payer: 59

## 2017-01-14 ENCOUNTER — Other Ambulatory Visit (INDEPENDENT_AMBULATORY_CARE_PROVIDER_SITE_OTHER): Payer: 59

## 2017-01-14 DIAGNOSIS — R52 Pain, unspecified: Secondary | ICD-10-CM

## 2017-01-14 DIAGNOSIS — M25552 Pain in left hip: Secondary | ICD-10-CM | POA: Diagnosis not present

## 2017-01-17 ENCOUNTER — Inpatient Hospital Stay (HOSPITAL_COMMUNITY)
Admission: EM | Admit: 2017-01-17 | Discharge: 2017-01-21 | DRG: 064 | Disposition: A | Discharge status: Home / Self Care | Payer: 59 | Attending: Internal Medicine | Admitting: Internal Medicine

## 2017-01-17 ENCOUNTER — Emergency Department (HOSPITAL_COMMUNITY): Payer: 59

## 2017-01-17 ENCOUNTER — Encounter (HOSPITAL_COMMUNITY): Payer: Self-pay | Admitting: Emergency Medicine

## 2017-01-17 DIAGNOSIS — Z881 Allergy status to other antibiotic agents status: Secondary | ICD-10-CM

## 2017-01-17 DIAGNOSIS — G8191 Hemiplegia, unspecified affecting right dominant side: Secondary | ICD-10-CM | POA: Diagnosis present

## 2017-01-17 DIAGNOSIS — I214 Non-ST elevation (NSTEMI) myocardial infarction: Secondary | ICD-10-CM | POA: Diagnosis present

## 2017-01-17 DIAGNOSIS — E785 Hyperlipidemia, unspecified: Secondary | ICD-10-CM | POA: Diagnosis present

## 2017-01-17 DIAGNOSIS — I6349 Cerebral infarction due to embolism of other cerebral artery: Secondary | ICD-10-CM

## 2017-01-17 DIAGNOSIS — I052 Rheumatic mitral stenosis with insufficiency: Secondary | ICD-10-CM | POA: Diagnosis present

## 2017-01-17 DIAGNOSIS — I63413 Cerebral infarction due to embolism of bilateral middle cerebral arteries: Secondary | ICD-10-CM

## 2017-01-17 DIAGNOSIS — Z91040 Latex allergy status: Secondary | ICD-10-CM

## 2017-01-17 DIAGNOSIS — Z880 Allergy status to penicillin: Secondary | ICD-10-CM | POA: Diagnosis not present

## 2017-01-17 DIAGNOSIS — H543 Unqualified visual loss, both eyes: Secondary | ICD-10-CM | POA: Diagnosis present

## 2017-01-17 DIAGNOSIS — I10 Essential (primary) hypertension: Secondary | ICD-10-CM | POA: Diagnosis present

## 2017-01-17 DIAGNOSIS — R29705 NIHSS score 5: Secondary | ICD-10-CM | POA: Diagnosis present

## 2017-01-17 DIAGNOSIS — E876 Hypokalemia: Secondary | ICD-10-CM | POA: Diagnosis present

## 2017-01-17 DIAGNOSIS — E7800 Pure hypercholesterolemia, unspecified: Secondary | ICD-10-CM | POA: Diagnosis not present

## 2017-01-17 DIAGNOSIS — I2510 Atherosclerotic heart disease of native coronary artery without angina pectoris: Secondary | ICD-10-CM | POA: Diagnosis present

## 2017-01-17 DIAGNOSIS — Z6841 Body Mass Index (BMI) 40.0 and over, adult: Secondary | ICD-10-CM | POA: Diagnosis not present

## 2017-01-17 DIAGNOSIS — I63449 Cerebral infarction due to embolism of unspecified cerebellar artery: Secondary | ICD-10-CM | POA: Diagnosis present

## 2017-01-17 DIAGNOSIS — M8580 Other specified disorders of bone density and structure, unspecified site: Secondary | ICD-10-CM | POA: Diagnosis present

## 2017-01-17 DIAGNOSIS — R748 Abnormal levels of other serum enzymes: Secondary | ICD-10-CM | POA: Diagnosis not present

## 2017-01-17 DIAGNOSIS — I619 Nontraumatic intracerebral hemorrhage, unspecified: Secondary | ICD-10-CM | POA: Diagnosis present

## 2017-01-17 DIAGNOSIS — I739 Peripheral vascular disease, unspecified: Secondary | ICD-10-CM | POA: Diagnosis present

## 2017-01-17 DIAGNOSIS — R7303 Prediabetes: Secondary | ICD-10-CM | POA: Diagnosis present

## 2017-01-17 DIAGNOSIS — I6783 Posterior reversible encephalopathy syndrome: Secondary | ICD-10-CM | POA: Diagnosis present

## 2017-01-17 DIAGNOSIS — Z9989 Dependence on other enabling machines and devices: Secondary | ICD-10-CM

## 2017-01-17 DIAGNOSIS — G4733 Obstructive sleep apnea (adult) (pediatric): Secondary | ICD-10-CM | POA: Diagnosis present

## 2017-01-17 DIAGNOSIS — Z96653 Presence of artificial knee joint, bilateral: Secondary | ICD-10-CM | POA: Diagnosis not present

## 2017-01-17 DIAGNOSIS — R7989 Other specified abnormal findings of blood chemistry: Secondary | ICD-10-CM

## 2017-01-17 DIAGNOSIS — Z79899 Other long term (current) drug therapy: Secondary | ICD-10-CM

## 2017-01-17 DIAGNOSIS — G459 Transient cerebral ischemic attack, unspecified: Secondary | ICD-10-CM | POA: Diagnosis not present

## 2017-01-17 DIAGNOSIS — I480 Paroxysmal atrial fibrillation: Secondary | ICD-10-CM

## 2017-01-17 DIAGNOSIS — Z888 Allergy status to other drugs, medicaments and biological substances status: Secondary | ICD-10-CM

## 2017-01-17 DIAGNOSIS — R410 Disorientation, unspecified: Secondary | ICD-10-CM

## 2017-01-17 DIAGNOSIS — R778 Other specified abnormalities of plasma proteins: Secondary | ICD-10-CM | POA: Diagnosis present

## 2017-01-17 DIAGNOSIS — I639 Cerebral infarction, unspecified: Secondary | ICD-10-CM | POA: Insufficient documentation

## 2017-01-17 LAB — I-STAT TROPONIN, ED: Troponin i, poc: 0.23 ng/mL (ref 0.00–0.08)

## 2017-01-17 LAB — URINALYSIS, ROUTINE W REFLEX MICROSCOPIC
Bilirubin Urine: NEGATIVE
Glucose, UA: NEGATIVE mg/dL
Hgb urine dipstick: NEGATIVE
Ketones, ur: NEGATIVE mg/dL
Leukocytes, UA: NEGATIVE
Nitrite: NEGATIVE
Protein, ur: NEGATIVE mg/dL
Specific Gravity, Urine: 1.017 (ref 1.005–1.030)
pH: 6 (ref 5.0–8.0)

## 2017-01-17 LAB — I-STAT CHEM 8, ED
BUN: 17 mg/dL (ref 6–20)
Calcium, Ion: 1.12 mmol/L — ABNORMAL LOW (ref 1.15–1.40)
Chloride: 99 mmol/L — ABNORMAL LOW (ref 101–111)
Creatinine, Ser: 0.6 mg/dL (ref 0.44–1.00)
Glucose, Bld: 125 mg/dL — ABNORMAL HIGH (ref 65–99)
HCT: 43 % (ref 36.0–46.0)
Hemoglobin: 14.6 g/dL (ref 12.0–15.0)
Potassium: 3.2 mmol/L — ABNORMAL LOW (ref 3.5–5.1)
Sodium: 140 mmol/L (ref 135–145)
TCO2: 27 mmol/L (ref 22–32)

## 2017-01-17 LAB — CBC
HCT: 45.6 % (ref 36.0–46.0)
Hemoglobin: 14.5 g/dL (ref 12.0–15.0)
MCH: 27.9 pg (ref 26.0–34.0)
MCHC: 31.8 g/dL (ref 30.0–36.0)
MCV: 87.7 fL (ref 78.0–100.0)
Platelets: 299 10*3/uL (ref 150–400)
RBC: 5.2 MIL/uL — ABNORMAL HIGH (ref 3.87–5.11)
RDW: 14.2 % (ref 11.5–15.5)
WBC: 9.7 10*3/uL (ref 4.0–10.5)

## 2017-01-17 LAB — PROTIME-INR
INR: 1.02
Prothrombin Time: 13.3 seconds (ref 11.4–15.2)

## 2017-01-17 LAB — RAPID URINE DRUG SCREEN, HOSP PERFORMED
Amphetamines: NOT DETECTED
Barbiturates: NOT DETECTED
Benzodiazepines: NOT DETECTED
Cocaine: NOT DETECTED
Opiates: NOT DETECTED
Tetrahydrocannabinol: NOT DETECTED

## 2017-01-17 LAB — COMPREHENSIVE METABOLIC PANEL
ALT: 32 U/L (ref 14–54)
AST: 34 U/L (ref 15–41)
Albumin: 3.8 g/dL (ref 3.5–5.0)
Alkaline Phosphatase: 81 U/L (ref 38–126)
Anion gap: 10 (ref 5–15)
BUN: 16 mg/dL (ref 6–20)
CO2: 28 mmol/L (ref 22–32)
Calcium: 9.2 mg/dL (ref 8.9–10.3)
Chloride: 103 mmol/L (ref 101–111)
Creatinine, Ser: 0.63 mg/dL (ref 0.44–1.00)
GFR calc Af Amer: >60 mL/min (ref >60)
GFR calc non Af Amer: >60 mL/min (ref >60)
Glucose, Bld: 129 mg/dL — ABNORMAL HIGH (ref 65–99)
Potassium: 3.3 mmol/L — ABNORMAL LOW (ref 3.5–5.1)
Sodium: 141 mmol/L (ref 135–145)
Total Bilirubin: 0.5 mg/dL (ref 0.3–1.2)
Total Protein: 7.1 g/dL (ref 6.5–8.1)

## 2017-01-17 LAB — DIFFERENTIAL
Basophils Absolute: 0 10*3/uL (ref 0.0–0.1)
Basophils Relative: 0 %
Eosinophils Absolute: 0.3 10*3/uL (ref 0.0–0.7)
Eosinophils Relative: 3 %
Lymphocytes Relative: 27 %
Lymphs Abs: 2.6 10*3/uL (ref 0.7–4.0)
Monocytes Absolute: 0.8 10*3/uL (ref 0.1–1.0)
Monocytes Relative: 8 %
Neutro Abs: 6 10*3/uL (ref 1.7–7.7)
Neutrophils Relative %: 62 %

## 2017-01-17 LAB — TROPONIN I: Troponin I: 1.45 ng/mL (ref <0.03)

## 2017-01-17 LAB — ETHANOL: Alcohol, Ethyl (B): <10 mg/dL (ref <10)

## 2017-01-17 LAB — APTT: aPTT: 30 seconds (ref 24–36)

## 2017-01-17 MED ORDER — SODIUM CHLORIDE 0.9 % IV BOLUS (SEPSIS)
1000.0000 mL | Freq: Once | INTRAVENOUS | Status: AC
  Administered 2017-01-17: 1000 mL via INTRAVENOUS

## 2017-01-17 MED ORDER — ALBUTEROL SULFATE (2.5 MG/3ML) 0.083% IN NEBU: 2.5000 mg | INHALATION_SOLUTION | Freq: Four times a day (QID) | RESPIRATORY_TRACT | Status: DC | PRN

## 2017-01-17 MED ORDER — HEPARIN BOLUS VIA INFUSION
4000.0000 [IU] | Freq: Once | INTRAVENOUS | Status: AC
Start: 2017-01-17 — End: 2017-01-17
  Administered 2017-01-17: 4000 [IU] via INTRAVENOUS

## 2017-01-17 MED ORDER — METOPROLOL TARTRATE 25 MG PO TABS
25.0000 mg | ORAL_TABLET | Freq: Two times a day (BID) | ORAL | Status: DC
  Administered 2017-01-18: 25 mg via ORAL
  Filled 2017-01-17: qty 1

## 2017-01-17 MED ORDER — HEPARIN (PORCINE) IN NACL 100-0.45 UNIT/ML-% IJ SOLN
900.0000 [IU]/h | INTRAMUSCULAR | Status: DC
  Administered 2017-01-17: 900 [IU]/h via INTRAVENOUS
  Filled 2017-01-17: qty 250

## 2017-01-17 MED ORDER — ACETAMINOPHEN 325 MG PO TABS: 650.0000 mg | ORAL_TABLET | Freq: Four times a day (QID) | ORAL | Status: DC | PRN

## 2017-01-17 MED ORDER — LOSARTAN POTASSIUM 50 MG PO TABS: 50.0000 mg | ORAL_TABLET | Freq: Every day | ORAL | Status: DC

## 2017-01-17 MED ORDER — DIPHENHYDRAMINE HCL 50 MG/ML IJ SOLN
12.5000 mg | Freq: Once | INTRAMUSCULAR | Status: AC
  Administered 2017-01-17: 12.5 mg via INTRAVENOUS
  Filled 2017-01-17: qty 1

## 2017-01-17 MED ORDER — ACETAMINOPHEN 500 MG PO TABS
500.0000 mg | ORAL_TABLET | Freq: Four times a day (QID) | ORAL | Status: DC | PRN
  Administered 2017-01-18: 500 mg via ORAL
  Filled 2017-01-17: qty 1

## 2017-01-17 MED ORDER — ASPIRIN 81 MG PO CHEW
324.0000 mg | CHEWABLE_TABLET | Freq: Once | ORAL | Status: AC
  Administered 2017-01-17: 324 mg via ORAL
  Filled 2017-01-17: qty 4

## 2017-01-17 MED ORDER — POTASSIUM CHLORIDE CRYS ER 20 MEQ PO TBCR
40.0000 meq | EXTENDED_RELEASE_TABLET | Freq: Once | ORAL | Status: AC
  Administered 2017-01-17: 40 meq via ORAL
  Filled 2017-01-17: qty 2

## 2017-01-17 MED ORDER — METOCLOPRAMIDE HCL 5 MG/ML IJ SOLN
10.0000 mg | Freq: Once | INTRAMUSCULAR | Status: AC
  Administered 2017-01-17: 10 mg via INTRAVENOUS
  Filled 2017-01-17: qty 2

## 2017-01-17 MED ORDER — KETOROLAC TROMETHAMINE 30 MG/ML IJ SOLN
15.0000 mg | Freq: Once | INTRAMUSCULAR | Status: AC
Start: 2017-01-17 — End: 2017-01-17
  Administered 2017-01-17: 15 mg via INTRAVENOUS
  Filled 2017-01-17: qty 1

## 2017-01-17 NOTE — ED Notes (Signed)
Teleneuro at bedside 

## 2017-01-17 NOTE — ED Notes (Signed)
Family at bedside. 

## 2017-01-17 NOTE — H&P (Signed)
History and Physical  Nicole Cabrera GMW:[id number] DOB: 06-Dec-1945 DOA: 01/17/2017  Referring physician: EDP PCP: [name], MD   Chief Complaint: headache, Confusion and slurred speech  HPI: Nicole Cabrera is a 71 y.o. female  H/o Body mass index is 49.45 kg/m.,  osa on cpap, HTN, PAF, presented to the [facility] due to confusion and slurred speech reported sudden onset around 1pm today.   code stroke was called in the ER ,she had a negative CT of the head.  UDS negative. Patient is evaluated by tele neurology who recommended possible complicated migraine. however her lab work showed initially elevated troponin tending up on repeat testing. ( 0.23 to 1.45).  patient denies of chest pain, no acute EKD changes. EDP talk to cardiology who agreed with heparin drip and admitted to the hospital for further evaluation, hospitallist called to admit the patient.  Patient remain confused with slurred speech and headache. she denies chest pain, no sob, no hypoxia, no edema, no abdominal pain, no fever. She walks with a cane for several years after knee replacement. she denies new onset of weakness.    Review of Systems:  Detail per HPI, Review of systems are otherwise negative  Past Medical History:  Diagnosis Date  . Anxiety    xanax  . Arthritis    shoulders/hips - tx with OTC meds  . Breast cyst 03/1990   pt denies 12/05/13  . Bronchitis   . Cervical tab 1972  . 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 Diabetes mellitus without complication ([place])    stated not diabetic but her AIC has been high so she is monitored closely   . Endometrial polyp 04/2000  . Hemorrhoids   . Hypertension   . Kidney stones   . Obesity   . Osteopenia 05/2007  . Peripheral neuropathy   . PMB (postmenopausal bleeding) 04/2000  . Renal stone    seen on [place] 1/16 or 2/16  . SAB (spontaneous abortion) 1981  . Shortness of breath    with exertion  . Sleep apnea    CPAP  . SVD (spontaneous  vaginal delivery) 04/1964, 05/1978   x3  . TIA (transient ischemic attack)    Past Surgical History:  Procedure Laterality Date  . CERVICAL DISC SURGERY  2/93  . CESAREAN SECTION  6/83   with BTL  . HYSTEROSCOPY  5/02, 01/2011  . LITHOTRIPSY  08/30/2014   with Dr. Jeffie Pollock at [facility]  . TOTAL KNEE ARTHROPLASTY Right 6/08   right  . TOTAL KNEE ARTHROPLASTY Left 4/10   left   . WRIST SURGERY Left 4/07   left   Social History:  reports that she has never smoked. She has never used smokeless tobacco. She reports that she does not drink alcohol or use drugs. Patient lives at home & is able to participate in activities of daily living independently , she walks with a cane  Allergies  Allergen Reactions  . Bactrim [Sulfamethoxazole-Trimethoprim] Other (See Comments)    Unknown   . Ciprofloxacin Other (See Comments)    GI upset  . Keflet [Cephalexin] Other (See Comments)    GI upset  . Lisinopril Cough  . Spironolactone Nausea Only  . Vistaril [Hydroxyzine Hcl] Other (See Comments)    Pt becomes physchotic.  . Adhesive [Tape] Itching, Rash and Other (See Comments)    Burning  . Erythromycin Rash  . Latex Rash  . Penicillins Rash    Has patient had a PCN  reaction causing immediate rash, facial/tongue/throat swelling, SOB or lightheadedness with hypotension: Yes Has patient had a PCN reaction causing severe rash involving mucus membranes or skin necrosis: No Has patient had a PCN reaction that required hospitalization No Has patient had a PCN reaction occurring within the last 10 years: No If all of the above answers are "NO", then may proceed with Cephalosporin use.   . Tetracycline Rash    Family History  Problem Relation Age of Onset  . Heart disease Father   . Colon cancer Mother   . Heart disease Mother   . Breast cancer Maternal Aunt   . Colon cancer Maternal Aunt   . Colon cancer Maternal Uncle   . Ehlers-Danlos syndrome Other        neice-being tested 4/15        Prior to Admission medications   Medication Sig Start Date End Date Taking? Authorizing Provider  acetaminophen (TYLENOL) 500 MG tablet Take 500 mg by mouth every 6 (six) hours as needed for moderate pain.    [provider]  albuterol (PROVENTIL HFA;VENTOLIN HFA) 108 (90 Base) MCG/ACT inhaler Inhale 2 puffs into the lungs every 6 (six) hours as needed for wheezing or shortness of breath. 08/29/15   Deneise Lever, MD  ALPRAZolam Duanne Moron) 0.5 MG tablet Take 0.25 mg by mouth daily as needed for anxiety.  10/27/13   [provider]  aspirin EC 81 MG tablet Take 81 mg by mouth 2 (two) times a week. Monday and Thursday.    [provider]  cholecalciferol (VITAMIN D) 1000 UNITS tablet Take 1,000 Units by mouth every morning.     [provider]  losartan-hydrochlorothiazide (HYZAAR) 100-25 MG per tablet Take 1 tablet by mouth every morning.     [provider]  metoprolol tartrate (LOPRESSOR) 25 MG tablet Take 1 tablet (25 mg total) by mouth 2 (two) times daily. 10/15/16   Arnoldo Lenis, MD  Multiple Vitamins-Minerals (MULTIVITAMIN WITH MINERALS) tablet Take 1 tablet by mouth every morning. Centrum Silver Womens 50+    [provider]    Physical Exam: BP 119/89   Pulse 78   Temp 98.5 F (36.9 C)   Resp (!) 24   Ht 4' 11.5" (1.511 m)   Wt 112.9 kg (249 lb)   LMP 01/22/2011   SpO2 93%   BMI 49.45 kg/m   General:  Obese, confused, states it is November, 2008 Eyes: PERRL ENT: unremarkable Neck: supple, no JVD Cardiovascular: RRR Respiratory: CTABL Abdomen: soft/ND/ND, positive bowel sounds Skin: no rash Musculoskeletal:  No edema Psychiatric: calm/cooperative Neurologic: confused, states it is November, 2008. No focal weakness          Labs on Admission:  Basic Metabolic Panel:  Recent Labs Lab 01/17/17 1629 01/17/17 1648  NA 141 140  K 3.3* 3.2*  CL 103 99*  CO2 28  --   GLUCOSE 129* 125*  BUN 16 17  CREATININE  0.63 0.60  CALCIUM 9.2  --    Liver Function Tests:  Recent Labs Lab 01/17/17 1629  AST 34  ALT 32  ALKPHOS 81  BILITOT 0.5  PROT 7.1  ALBUMIN 3.8   No results for input(s): LIPASE, AMYLASE in the last 168 hours. No results for input(s): AMMONIA in the last 168 hours. CBC:  Recent Labs Lab 01/17/17 1629 01/17/17 1648  WBC 9.7  --   NEUTROABS 6.0  --   HGB 14.5 14.6  HCT 45.6 43.0  MCV 87.7  --  PLT 299  --    Cardiac Enzymes:  Recent Labs Lab 01/17/17 1749  TROPONINI 1.45*    BNP (last 3 results) No results for input(s): BNP in the last 8760 hours.  ProBNP (last 3 results) No results for input(s): PROBNP in the last 8760 hours.  CBG: No results for input(s): GLUCAP in the last 168 hours.  Radiological Exams on Admission: Ct Head Code Stroke Wo Contrast  Result Date: 01/17/2017 CLINICAL DATA:  Code stroke. Altered level of consciousness. Could not remember how to get home. EXAM: CT HEAD WITHOUT CONTRAST TECHNIQUE: Contiguous axial images were obtained from the base of the skull through the vertex without intravenous contrast. COMPARISON:  08/08/2015 FINDINGS: Brain: No evidence of acute infarction, hemorrhage, hydrocephalus, extra-axial collection or mass lesion/mass effect. Mild volume loss and chronic small vessel ischemia. Vascular: Atherosclerotic calcification.  No high-density vessel. Skull: Negative for fracture Sinuses/Orbits: Negative Other: These results were called by telephone at the time of interpretation on 01/17/2017 at 4:28 pm to Dr. Virgel Manifold , who verbally acknowledged these results. ASPECTS [facility] Stroke Program Early CT Score) Not scored with this history IMPRESSION: No acute finding or change from 2017. Electronically Signed   By: Monte Fantasia M.D.   On: 01/17/2017 16:29    EKG: Independently reviewed. Sinus rhythm, no acute st/t changes, few pac's, possible left atrial enlargement.  Assessment/Plan Present on  Admission: **None**   Headache, confusion, slurred speech -ct head no acute findings, uds negative -Tele neurology evaluated patient at [facility] ED, no tpa given, per neurology this is a concern for complicated migraine.  -Will get mri brain/carotid us/echocardiogram, check lipid panel and a1c -check rpr/hiv/ammonia/b12/folate -Continue tele, continue heparin drip, continue betablocker, start lower dose of losartan, hold hctz -prn tylenol for headache   Elevated troponin: denies chest pain  -Continue heparin drip, betablocker, (she was given asa in ED) -Check lipid panel, Continue trend troponin, echo ordered. Repeat ekg in am for interval changes -keep npo aftermidnight in case intervention  -cardiology notified  PAF: recently diagnosed a few months ago Currently sinus rhythm with a few pac's Recent tsh in 08/2016 was 2.3 chadsvasc score 5-6 including age, female, htn, h/o TIA, possible h/o diabetes ( check a1c) She is started on heparin drip for now due to elevated troponin.  (she reports has not been on oral anticoagulation due to plan to have colonoscopy in the near future)  Continue betablocker Cardiology consulted, further management per cardiology.  Hypokalemia: replaced , check mag Hold hctz.  HTN: continue betablocker. Start on lower dose of losartan ( on 100mg  daily at home, start with 50mg  daily and titrate), hold hctz.  Body mass index is 49.45 kg/m. osa: continue nightly cpap  DVT prophylaxis: currently on heparin drip  Consultants:  EDP called cardiology EDP called neurology  Code Status: full   Family Communication:  Patient and son at bedside  Disposition Plan: admit to stepdown  Time spent: 63mins  Katlin Bortner MD, PhD [facility] Pager [phone] If 7PM-7AM, please contact night-coverage at [email or web address], password [facility]

## 2017-01-17 NOTE — Progress Notes (Signed)
ANTICOAGULATION CONSULT NOTE - Initial Consult  Pharmacy Consult for Heparin Indication: chest pain/ACS  Allergies  Allergen Reactions  . Bactrim [Sulfamethoxazole-Trimethoprim] Other (See Comments)    Unknown   . Ciprofloxacin Other (See Comments)    GI upset  . Keflet [Cephalexin] Other (See Comments)    GI upset  . Lisinopril Cough  . Spironolactone Nausea Only  . Vistaril [Hydroxyzine Hcl] Other (See Comments)    Pt becomes physchotic.  . Adhesive [Tape] Itching, Rash and Other (See Comments)    Burning  . Erythromycin Rash  . Latex Rash  . Penicillins Rash    Has patient had a PCN reaction causing immediate rash, facial/tongue/throat swelling, SOB or lightheadedness with hypotension: Yes Has patient had a PCN reaction causing severe rash involving mucus membranes or skin necrosis: No Has patient had a PCN reaction that required hospitalization No Has patient had a PCN reaction occurring within the last 10 years: No If all of the above answers are "NO", then may proceed with Cephalosporin use.   . Tetracycline Rash    Patient Measurements: Height: 4' 11.5" (151.1 cm) Weight: 249 lb (112.9 kg) IBW/kg (Calculated) : 44.35 HEPARIN DW (KG): 72.7  Vital Signs: Temp: 98.5 F (36.9 C) (10/28 1758) Temp Source: Oral (10/28 1607) BP: 171/75 (10/28 1800) Pulse Rate: 68 (10/28 1800)  Labs:  Recent Labs  01/17/17 1629 01/17/17 1648 01/17/17 1749  HGB 14.5 14.6  --   HCT 45.6 43.0  --   PLT 299  --   --   APTT 30  --   --   LABPROT 13.3  --   --   INR 1.02  --   --   CREATININE 0.63 0.60  --   TROPONINI  --   --  1.45*    Estimated Creatinine Clearance: 72 mL/min (by C-G formula based on SCr of 0.6 mg/dL).   Medical History: Past Medical History:  Diagnosis Date  . Anxiety    xanax  . Arthritis    shoulders/hips - tx with OTC meds  . Breast cyst 03/1990   pt denies 12/05/13  . Bronchitis   . Cervical tab 1972  . CPD (cephalo-pelvic disproportion)  07/1981  . Diabetes mellitus without complication ([place])    stated not diabetic but her AIC has been high so she is monitored closely   . Endometrial polyp 04/2000  . Hemorrhoids   . Hypertension   . Kidney stones   . Obesity   . Osteopenia 05/2007  . Peripheral neuropathy   . PMB (postmenopausal bleeding) 04/2000  . Renal stone    seen on [place] 1/16 or 2/16  . SAB (spontaneous abortion) 1981  . Shortness of breath    with exertion  . Sleep apnea    CPAP  . SVD (spontaneous vaginal delivery) 04/1964, 05/1978   x3  . TIA (transient ischemic attack)     Medications:  See med rec  Assessment: 71 yo female presented to the ED with altered mental status with confusion Neuro thought patient may have a complex migraine. Patient has elevated troponins. Plan is to start heparin.   Goal of Therapy:  Heparin level 0.3-0.7 units/ml Monitor platelets by anticoagulation protocol: Yes   Plan:  Give 4000 units bolus x 1 Start heparin infusion at 900 units/hr Check anti-Xa level in 8 hours and daily while on heparin Continue to monitor H&H and platelets  Isac Sarna, BS [place]ena [place], BCPS Clinical Pharmacist Pager [phone] 01/17/2017,6:58 PM

## 2017-01-17 NOTE — ED Triage Notes (Signed)
Pt states she was driving home and could not remember how to get home and was not driving well. She called family. Hx tia, pt having a hard time putting her words together. States the happened after lunch around 1pm.

## 2017-01-17 NOTE — ED Notes (Signed)
No TPA at this time. Tele has signed off

## 2017-01-17 NOTE — Progress Notes (Signed)
Patient arrived from [facility] stable I spoke with Dr. [place] Crane she is aware patient has arrived and wants to continue plan of care with MRI of the head. No family at bedside at this time.

## 2017-01-17 NOTE — ED Notes (Signed)
ED Provider at bedside. 

## 2017-01-17 NOTE — Progress Notes (Signed)
Code Stroke -   6384 Call time 1612 exam started  1614 exam finished  5364 images sent [id number] Completed in epic [place] called

## 2017-01-17 NOTE — ED Notes (Signed)
CRITICAL VALUE ALERT  Critical Value:  Troponin 1.45  Date & Time Notied:  01/17/17 1850  Provider Notified: Wilson Singer MD  Orders Received/Actions taken: no orders given

## 2017-01-17 NOTE — ED Provider Notes (Signed)
[facility] EMERGENCY DEPARTMENT Provider Note   CSN: [id number] Arrival date & time: 01/17/17  1604     History   Chief Complaint Chief Complaint  Patient presents with  . Altered Mental Status    HPI Nicole Cabrera is a 71 y.o. female.  HPI   71 year old female brought in by her friend gets confusion.  Last known normal sometime between 1230 and 1 PM today.  Patient went to her friend's house and seemed confused.  She seemed to have a hard time gathering her thoughts and difficulty remembering things that she would normally be able to recall very easily.  Patient is cognizant of this.  No slurred speech.  No acute visual changes.  No weakness.  She does not feel off balance.  She is complaining of a headache.  Past Medical History:  Diagnosis Date  . Anxiety    xanax  . Arthritis    shoulders/hips - tx with OTC meds  . Breast cyst 03/1990   pt denies 12/05/13  . Bronchitis   . Cervical tab 1972  . 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 Diabetes mellitus without complication ([place])    stated not diabetic but her AIC has been high so she is monitored closely   . Endometrial polyp 04/2000  . Hemorrhoids   . Hypertension   . Kidney stones   . Obesity   . Osteopenia 05/2007  . Peripheral neuropathy   . PMB (postmenopausal bleeding) 04/2000  . Renal stone    seen on [place] 1/16 or 2/16  . SAB (spontaneous abortion) 1981  . Shortness of breath    with exertion  . Sleep apnea    CPAP  . SVD (spontaneous vaginal delivery) 04/1964, 05/1978   x3  . TIA (transient ischemic attack)     Patient Active Problem List   Diagnosis Date Noted  . Obesity 06/20/2015  . Nephrolithiasis 07/09/2014  . Asthma with bronchitis 02/23/2014  . Aphasia 02/27/2013  . ACUTE BRONCHITIS 08/30/2009  . HYPERTENSION 02/22/2007  . Obstructive sleep apnea 02/22/2007  . ALLERGY 02/22/2007    Past Surgical History:  Procedure Laterality Date  . CERVICAL DISC SURGERY  2/93  . CESAREAN  SECTION  6/83   with BTL  . HYSTEROSCOPY  5/02, 01/2011  . LITHOTRIPSY  08/30/2014   with Dr. Jeffie Pollock at [facility]  . TOTAL KNEE ARTHROPLASTY Right 6/08   right  . TOTAL KNEE ARTHROPLASTY Left 4/10   left   . WRIST SURGERY Left 4/07   left    OB History    Gravida Para Term Preterm AB Living   5 3     2 3    SAB TAB Ectopic Multiple Live Births   1 1             Home Medications    Prior to Admission medications   Medication Sig Start Date End Date Taking? Authorizing Provider  acetaminophen (TYLENOL) 500 MG tablet Take 500 mg by mouth every 6 (six) hours as needed for moderate pain.    [provider]  albuterol (PROVENTIL HFA;VENTOLIN HFA) 108 (90 Base) MCG/ACT inhaler Inhale 2 puffs into the lungs every 6 (six) hours as needed for wheezing or shortness of breath. 08/29/15   Deneise Lever, MD  ALPRAZolam Duanne Moron) 0.5 MG tablet Take 0.25 mg by mouth daily as needed for anxiety.  10/27/13   [provider]  aspirin EC 81 MG tablet Take 81 mg by mouth 2 (two) times  a week. Monday and Thursday.    [provider]  cholecalciferol (VITAMIN D) 1000 UNITS tablet Take 1,000 Units by mouth every morning.     [provider]  losartan-hydrochlorothiazide (HYZAAR) 100-25 MG per tablet Take 1 tablet by mouth every morning.     [provider]  metoprolol tartrate (LOPRESSOR) 25 MG tablet Take 1 tablet (25 mg total) by mouth 2 (two) times daily. 10/15/16   Arnoldo Lenis, MD  Multiple Vitamins-Minerals (MULTIVITAMIN WITH MINERALS) tablet Take 1 tablet by mouth every morning. Centrum Silver Womens 50+    [provider]    Family History Family History  Problem Relation Age of Onset  . Heart disease Father   . Colon cancer Mother   . Heart disease Mother   . Breast cancer Maternal Aunt   . Colon cancer Maternal Aunt   . Colon cancer Maternal Uncle   . Ehlers-Danlos syndrome Other        neice-being tested 4/15    Social  History Social History  Substance Use Topics  . Smoking status: Never Smoker  . Smokeless tobacco: Never Used  . Alcohol use No     Comment: maybe once a month     Allergies   Bactrim [sulfamethoxazole-trimethoprim]; Ciprofloxacin; Keflet [cephalexin]; Lisinopril; Spironolactone; Vistaril [hydroxyzine hcl]; Adhesive [tape]; Erythromycin; Latex; Penicillins; and Tetracycline   Review of Systems Review of Systems  All systems reviewed and negative, other than as noted in HPI.  Physical Exam Updated Vital Signs BP (!) 164/94 (BP Location: Right Arm)   Pulse 69   Resp 20   Ht 4' 11.5" (1.511 m)   Wt 112.9 kg (249 lb)   LMP 01/22/2011   SpO2 95%   BMI 49.45 kg/m   Physical Exam  Constitutional: She appears well-developed and well-nourished. No distress.  HENT:  Head: Normocephalic and atraumatic.  Eyes: Conjunctivae are normal. Right eye exhibits no discharge. Left eye exhibits no discharge.  Neck: Neck supple.  Cardiovascular: Normal rate, regular rhythm and normal heart sounds. Exam reveals no gallop and no friction rub.  No murmur heard. Pulmonary/Chest: Effort normal and breath sounds normal. No respiratory distress.  Abdominal: Soft. She exhibits no distension. There is no tenderness.  Musculoskeletal: She exhibits no edema or tenderness.  Neurological: She is alert.  Speech clear.  Question expressive aphasia.  Strength 5 out of 5 bilateral upper and lower extremities.  Skin: Skin is warm and dry.  Psychiatric: She has a normal mood and affect. Her behavior is normal. Thought content normal.  Nursing note and vitals reviewed.    ED Treatments / Results  Labs (all labs ordered are listed, but only abnormal results are displayed) Labs Reviewed  CBC - Abnormal; Notable for the following:       Result Value   RBC 5.20 (*)    All other components within normal limits  COMPREHENSIVE METABOLIC PANEL - Abnormal; Notable for the following:    Potassium 3.3 (*)     Glucose, Bld 129 (*)    All other components within normal limits  I-STAT CHEM 8, ED - Abnormal; Notable for the following:    Potassium 3.2 (*)    Chloride 99 (*)    Glucose, Bld 125 (*)    Calcium, Ion 1.12 (*)    All other components within normal limits  I-STAT TROPONIN, ED - Abnormal; Notable for the following:    Troponin i, poc 0.23 (*)    All other components within normal limits  ETHANOL  PROTIME-INR  APTT  DIFFERENTIAL  RAPID URINE DRUG SCREEN, HOSP PERFORMED  URINALYSIS, ROUTINE W REFLEX MICROSCOPIC  TROPONIN I    EKG  EKG Interpretation  Date/Time:  "Sunday January 17 2017 16:32:13 EDT Ventricular Rate:  70 PR Interval:    QRS Duration: 90 QT Interval:  414 QTC Calculation: 447 R Axis:   3 Text Interpretation:  Sinus rhythm Prolonged PR interval Low voltage, precordial leads Abnormal R-wave progression, late transition Confirmed by ,  ([id number]) on 01/17/2017 6:03:02 PM       Radiology Ct Head Code Stroke Wo Contrast  Result Date: 01/17/2017 CLINICAL DATA:  Code stroke. Altered level of consciousness. Could not remember how to get home. EXAM: CT HEAD WITHOUT CONTRAST TECHNIQUE: Contiguous axial images were obtained from the base of the skull through the vertex without intravenous contrast. COMPARISON:  08/08/2015 FINDINGS: Brain: No evidence of acute infarction, hemorrhage, hydrocephalus, extra-axial collection or mass lesion/mass effect. Mild volume loss and chronic small vessel ischemia. Vascular: Atherosclerotic calcification.  No high-density vessel. Skull: Negative for fracture Sinuses/Orbits: Negative Other: These results were called by telephone at the time of interpretation on 01/17/2017 at 4:28 pm to Dr.   , who verbally acknowledged these results. ASPECTS ([place] Stroke Program Early CT Score) Not scored with this history IMPRESSION: No acute finding or change from 2017. Electronically Signed   By: Jonathon  Watts M.D.   On: 01/17/2017  16:29    Procedures Procedures (including critical care time)  CRITICAL CARE Performed by: ,  Total critical care time:35 minutes Critical care time was exclusive of separately billable procedures and treating other patients. Critical care was necessary to treat or prevent imminent or life-threatening deterioration. Critical care was time spent personally by me on the following activities: development of treatment plan with patient and/or surrogate as well as nursing, discussions with consultants, evaluation of patient's response to treatment, examination of patient, obtaining history from patient or surrogate, ordering and performing treatments and interventions, ordering and review of laboratory studies, ordering and review of radiographic studies, pulse oximetry and re-evaluation of patient's condition.   Medications Ordered in ED Medications - No data to display   Initial Impression / Assessment and Plan / ED Course  I have reviewed the triage vital signs and the nursing notes.  Pertinent labs & imaging results that were available during my care of the patient were reviewed by me and considered in my medical decision making (see chart for details).     71" yF with confusion. It's hard for me to get an exact sense of her symptoms. Her thought process is disorganized. Her attention is poor. Her answers are often inconsistent such as when trying to assess peripheral vision I had the sense she was actually guessing.  I cannot clearly identify a focal neuro deficit otherwise.  She was evaluated by neurology. Advised to treat headache. They question possible complicated migraine. She is still not back to baseline. Will admit.   6:56 PM Initial troponin mildly elevated. She has consistently denied chest/neck/arm/throat/jaw/abdominal pain. No dyspnea, nausea, diaphoresis, etc. EKG w/o overt ischemic changes. Hx of afib but currently sinus rhythm. I was planning on admitting her  for encephalopathy/CVA work-up and hospitalist service was paged. In the mean time, a repeat troponin is now 1.45. Repeat EKG non-concerning. Still denying CP/dyspnea etc. Her symptoms do not clearly fit with neurogenic origin.  I question wether actually very atypical symptom of ACS (female, diabetic, etc). Will discuss with cardiology.   7:17 PM  Discussed with cardiology, Dr Harrell Gave. She feels that she can stay at [facility] for the time being. Not clear if symptoms primarily cardiac in origin or possibly stress induced from other event. ASA/heparin. Trend enzymes. They'll see her in consultation in the morning unless situation acutely changes over night.   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

## 2017-01-18 ENCOUNTER — Inpatient Hospital Stay (HOSPITAL_COMMUNITY): Payer: 59

## 2017-01-18 ENCOUNTER — Encounter (HOSPITAL_COMMUNITY): Payer: Self-pay | Admitting: *Deleted

## 2017-01-18 DIAGNOSIS — I6783 Posterior reversible encephalopathy syndrome: Secondary | ICD-10-CM

## 2017-01-18 DIAGNOSIS — G459 Transient cerebral ischemic attack, unspecified: Secondary | ICD-10-CM

## 2017-01-18 DIAGNOSIS — I639 Cerebral infarction, unspecified: Secondary | ICD-10-CM | POA: Insufficient documentation

## 2017-01-18 DIAGNOSIS — I619 Nontraumatic intracerebral hemorrhage, unspecified: Secondary | ICD-10-CM | POA: Insufficient documentation

## 2017-01-18 LAB — COMPREHENSIVE METABOLIC PANEL
ALT: 33 U/L (ref 14–54)
AST: 41 U/L (ref 15–41)
Albumin: 3.3 g/dL — ABNORMAL LOW (ref 3.5–5.0)
Alkaline Phosphatase: 81 U/L (ref 38–126)
Anion gap: 9 (ref 5–15)
BUN: 9 mg/dL (ref 6–20)
CO2: 24 mmol/L (ref 22–32)
Calcium: 8.5 mg/dL — ABNORMAL LOW (ref 8.9–10.3)
Chloride: 103 mmol/L (ref 101–111)
Creatinine, Ser: 0.61 mg/dL (ref 0.44–1.00)
GFR calc Af Amer: >60 mL/min (ref >60)
GFR calc non Af Amer: >60 mL/min (ref >60)
Glucose, Bld: 108 mg/dL — ABNORMAL HIGH (ref 65–99)
Potassium: 3.5 mmol/L (ref 3.5–5.1)
Sodium: 136 mmol/L (ref 135–145)
Total Bilirubin: 0.8 mg/dL (ref 0.3–1.2)
Total Protein: 6.4 g/dL — ABNORMAL LOW (ref 6.5–8.1)

## 2017-01-18 LAB — VITAMIN B12: Vitamin B-12: 599 pg/mL (ref 180–914)

## 2017-01-18 LAB — TROPONIN I
Troponin I: 2.15 ng/mL (ref <0.03)
Troponin I: 1.6 ng/mL (ref <0.03)
Troponin I: 0.96 ng/mL (ref <0.03)

## 2017-01-18 LAB — HEMOGLOBIN A1C
Hgb A1c MFr Bld: 6.3 % — ABNORMAL HIGH (ref 4.8–5.6)
Mean Plasma Glucose: 134.11 mg/dL

## 2017-01-18 LAB — LIPID PANEL
Cholesterol: 168 mg/dL (ref 0–200)
HDL: 37 mg/dL — ABNORMAL LOW (ref >40)
LDL Cholesterol: 99 mg/dL (ref 0–99)
Total CHOL/HDL Ratio: 4.5 RATIO
Triglycerides: 158 mg/dL — ABNORMAL HIGH (ref <150)
VLDL: 32 mg/dL (ref 0–40)

## 2017-01-18 LAB — HIV ANTIBODY (ROUTINE TESTING W REFLEX): HIV Screen 4th Generation wRfx: NONREACTIVE

## 2017-01-18 LAB — CBC
HCT: 43.1 % (ref 36.0–46.0)
Hemoglobin: 14.3 g/dL (ref 12.0–15.0)
MCH: 28.6 pg (ref 26.0–34.0)
MCHC: 33.2 g/dL (ref 30.0–36.0)
MCV: 86.2 fL (ref 78.0–100.0)
Platelets: 313 10*3/uL (ref 150–400)
RBC: 5 MIL/uL (ref 3.87–5.11)
RDW: 14.2 % (ref 11.5–15.5)
WBC: 10.3 10*3/uL (ref 4.0–10.5)

## 2017-01-18 LAB — FOLATE: Folate: 28 ng/mL (ref >5.9)

## 2017-01-18 LAB — HEPARIN LEVEL (UNFRACTIONATED)
Heparin Unfractionated: <0.1 IU/mL — ABNORMAL LOW (ref 0.30–0.70)
Heparin Unfractionated: <0.1 IU/mL — ABNORMAL LOW (ref 0.30–0.70)

## 2017-01-18 LAB — ECHOCARDIOGRAM COMPLETE
Height: 59.5 in
Weight: 3984 oz

## 2017-01-18 LAB — MAGNESIUM: Magnesium: 1.8 mg/dL (ref 1.7–2.4)

## 2017-01-18 LAB — RPR: RPR Ser Ql: NONREACTIVE

## 2017-01-18 LAB — AMMONIA: Ammonia: 28 umol/L (ref 9–35)

## 2017-01-18 LAB — GLUCOSE, CAPILLARY: Glucose-Capillary: 122 mg/dL — ABNORMAL HIGH (ref 65–99)

## 2017-01-18 LAB — D-DIMER, QUANTITATIVE: D-Dimer, Quant: 0.37 ug/mL-FEU (ref 0.00–0.50)

## 2017-01-18 LAB — MRSA PCR SCREENING: MRSA by PCR: NEGATIVE

## 2017-01-18 MED ORDER — IOPAMIDOL (ISOVUE-370) INJECTION 76%
INTRAVENOUS | Status: AC
  Administered 2017-01-18: 50 mL via INTRAVENOUS
  Filled 2017-01-18: qty 50

## 2017-01-18 MED ORDER — ATORVASTATIN CALCIUM 40 MG PO TABS
40.0000 mg | ORAL_TABLET | Freq: Every day | ORAL | Status: DC
  Administered 2017-01-19 – 2017-01-20 (×2): 40 mg via ORAL
  Filled 2017-01-18 (×3): qty 1

## 2017-01-18 MED ORDER — ACETAMINOPHEN 10 MG/ML IV SOLN
1000.0000 mg | Freq: Once | INTRAVENOUS | Status: DC
  Filled 2017-01-18: qty 100

## 2017-01-18 MED ORDER — ACETAMINOPHEN 325 MG PO TABS
650.0000 mg | ORAL_TABLET | Freq: Four times a day (QID) | ORAL | Status: DC | PRN
  Administered 2017-01-18 – 2017-01-19 (×3): 650 mg via ORAL
  Filled 2017-01-18 (×2): qty 2

## 2017-01-18 MED ORDER — ASPIRIN EC 81 MG PO TBEC
81.0000 mg | DELAYED_RELEASE_TABLET | Freq: Every day | ORAL | Status: DC
  Administered 2017-01-18 – 2017-01-20 (×3): 81 mg via ORAL
  Filled 2017-01-18 (×3): qty 1

## 2017-01-18 MED ORDER — HEPARIN (PORCINE) IN NACL 100-0.45 UNIT/ML-% IJ SOLN
1250.0000 [IU]/h | INTRAMUSCULAR | Status: DC
  Administered 2017-01-18: 1000 [IU]/h via INTRAVENOUS
  Administered 2017-01-18: 1150 [IU]/h via INTRAVENOUS
  Administered 2017-01-19: 1250 [IU]/h via INTRAVENOUS
  Filled 2017-01-18 (×2): qty 250

## 2017-01-18 MED ORDER — METOPROLOL TARTRATE 5 MG/5ML IV SOLN
2.5000 mg | Freq: Four times a day (QID) | INTRAVENOUS | Status: DC
  Administered 2017-01-18 – 2017-01-21 (×11): 2.5 mg via INTRAVENOUS
  Filled 2017-01-18 (×10): qty 5

## 2017-01-18 MED ORDER — CLOPIDOGREL BISULFATE 75 MG PO TABS
75.0000 mg | ORAL_TABLET | Freq: Every day | ORAL | Status: DC
  Administered 2017-01-18 – 2017-01-20 (×3): 75 mg via ORAL
  Filled 2017-01-18 (×3): qty 1

## 2017-01-18 NOTE — Progress Notes (Signed)
ANTICOAGULATION CONSULT NOTE - Follow Up Consult  Pharmacy Consult for Heparin Indication: afib, new CVA  Allergies  Allergen Reactions  . Bactrim [Sulfamethoxazole-Trimethoprim] Other (See Comments)    Unknown   . Ciprofloxacin Other (See Comments)    GI upset  . Hydroxyzine Hcl     Other reaction(s): Other (See Comments) Pt becomes physchotic.  Marland Kitchen Keflet [Cephalexin] Other (See Comments)    GI upset  . Lisinopril Cough  . Spironolactone Nausea Only  . Vistaril [Hydroxyzine Hcl] Other (See Comments)    Pt becomes physchotic.  . Adhesive [Tape] Itching, Rash and Other (See Comments)    Burning  . Erythromycin Rash  . Latex Rash  . Penicillins Rash    Has patient had a PCN reaction causing immediate rash, facial/tongue/throat swelling, SOB or lightheadedness with hypotension: Yes Has patient had a PCN reaction causing severe rash involving mucus membranes or skin necrosis: No Has patient had a PCN reaction that required hospitalization No Has patient had a PCN reaction occurring within the last 10 years: No If all of the above answers are "NO", then may proceed with Cephalosporin use.   . Tetracycline Rash    Patient Measurements: Height: 4' 11.5" (151.1 cm) Weight: 249 lb (112.9 kg) IBW/kg (Calculated) : 44.35 Heparin Dosing Weight:  72.4 kg  Vital Signs: Temp: 98.3 F (36.8 C) (10/29 1713) Temp Source: Oral (10/29 1713) BP: 147/72 (10/29 1713) Pulse Rate: 82 (10/29 1713)  Labs:  Recent Labs  01/17/17 1629 01/17/17 1648  01/18/17 0223 01/18/17 0232 01/18/17 0745 01/18/17 1151 01/18/17 1555  HGB 14.5 14.6  --   --   --  14.3  --   --   HCT 45.6 43.0  --   --   --  43.1  --   --   PLT 299  --   --   --   --  313  --   --   APTT 30  --   --   --   --   --   --   --   LABPROT 13.3  --   --   --   --   --   --   --   INR 1.02  --   --   --   --   --   --   --   HEPARINUNFRC  --   --   --   --  <0.10*  --   --  <0.10*  CREATININE 0.63 0.60  --   --   --  0.61   --   --   TROPONINI  --   --   < > 2.15*  --  1.60* 0.96*  --   < > = values in this interval not displayed.  Estimated Creatinine Clearance: 72 mL/min (by C-G formula based on SCr of 0.61 mg/dL).   Assessment:  Anticoag: Afib - on heparin, Chadsvasc 6.  Higher risk for bleeding given suspected blood in stool and epistaxis?  However, due to rising troponins and potential that current CVA caused by afib, MD decided to start heparin, but target lower HL 0.3-0.5. Initial heparin level <0.1  Goal of Therapy:  Heparin level 0.5-0.5 units/ml Monitor platelets by anticoagulation protocol: Yes   Plan:  Increase IV heparin (no bolus) 1150 units/hr Recheck HL in 6 hrs. See neurology recommendations this PM  Arya Boxley S. Alford Highland, PharmD, BCPS Clinical Staff Pharmacist Pager [phone]  Eilene Ghazi Stillinger 01/18/2017,5:42 PM

## 2017-01-18 NOTE — Progress Notes (Signed)
PROGRESS NOTE    Nicole Cabrera  AOZ:[id number] DOB: 1945/07/18 DOA: 01/17/2017 PCP: [name], MD   Specialists:     Brief Narrative:  71 year old female With hypertension New diagnosis 09/14/2016 paroxysmal atrial fibrillation, Chads score 6  Epistaxis complicating issue as per progress note 09/21/16 and was hesitant to start anticoagulation OSA on CPAP 11 cm followed by Dr. Annamaria Boots HTN  I sent over from [facility] hospital 01/17/2017 slurred speech and headache MRI brain showed acute subacute infarcts with petechial hemorrhage  Further workup carotid ultrasound echocardiogram RPR HIV ammonia B12 folate pending Elevated and trending troponins concerning for non-ST segment MI  Cardiology neurology consulted-initially felt to hold heparin would be risky however with rising troponin heparin drip started at low dose under guidance of neurology and cardiology Concern for press syndrome by neurology and CT scan repeated   Assessment & Plan:   Active Problems:   Elevated troponin   Non-ST MI  Further planning as per cardiology  Keep n.p.o. aspirin 325 given, continue ASA 81 as well as Plavix 35  Still on heparin low-dose  Given unclear etiology as a stroke versus press syndrome--neurology has elected to have BP below 962  systolic  Added metoprolol 2.5 q6 hrs SYs BP> 140  Press syndrome versus stroke  CT head repeat ordered is pending to rule out etiology   timing of antiplatelet anticoagulation as per above discussion between cardiology and neurology  Atrial fibrillation chads score at least 5-6, untreated with anticoagulation secondary to epistaxis?  GI bleed risk  Patient currently is rate controlled at this time  Defer to cardiology for further management at this juncture  If heart rate >120 may use as needed metoprolol as above  HTN  See above discussion  OSA supposed to be on CPAP 11 cm  Restart the same during hospitalization     DVT prophylaxis: on  heparin asa and plavix pending review form Neurology Code Status: full  Family Communication: d/w son Disposition Plan:  Inpatient SDU for now   Consultants:   Neuro   Cardiology  Procedures:   MRI ECHO  CT head  Antimicrobials:   nohne    Subjective: Awake  Not willing to engage--doesn't wish to ansewrer quesitons Son at bedsdie attempting to have her be engaging No focal deficit, no sob No cp No fever Margorie John states was confused and was talking funny and doing weird things Has occurred x 3 before  Objective: Vitals:   01/18/17 0300 01/18/17 0400 01/18/17 0500 01/18/17 0600  BP: (!) 144/73 131/68 107/65 123/72  Pulse: 67 70 (!) 33 65  Resp: (!) 24 (!) 23 (!) 25 (!) 28  Temp:      TempSrc:      SpO2: 93% 93% 92% 96%  Weight:      Height:        Intake/Output Summary (Last 24 hours) at 01/18/17 0752 Last data filed at 01/18/17 0300  Gross per 24 hour  Intake             1000 ml  Output              350 ml  Net              650 ml   Filed Weights   01/17/17 1607  Weight: 112.9 kg (249 lb)    Examination:  Patient refused full exam eomi ncat lifting upper and LE's  Data Reviewed: I have personally reviewed following labs and imaging studies  CBC:  Recent Labs Lab 01/17/17 1629 01/17/17 1648  WBC 9.7  --   NEUTROABS 6.0  --   HGB 14.5 14.6  HCT 45.6 43.0  MCV 87.7  --   PLT 299  --    Basic Metabolic Panel:  Recent Labs Lab 01/17/17 1629 01/17/17 1648 01/18/17 0223  NA 141 140  --   K 3.3* 3.2*  --   CL 103 99*  --   CO2 28  --   --   GLUCOSE 129* 125*  --   BUN 16 17  --   CREATININE 0.63 0.60  --   CALCIUM 9.2  --   --   MG  --   --  1.8   GFR: Estimated Creatinine Clearance: 72 mL/min (by C-G formula based on SCr of 0.6 mg/dL). Liver Function Tests:  Recent Labs Lab 01/17/17 1629  AST 34  ALT 32  ALKPHOS 81  BILITOT 0.5  PROT 7.1  ALBUMIN 3.8   No results for input(s): LIPASE, AMYLASE in the last 168  hours.  Recent Labs Lab 01/18/17 0223  AMMONIA 28   Coagulation Profile:  Recent Labs Lab 01/17/17 1629  INR 1.02   Cardiac Enzymes:  Recent Labs Lab 01/17/17 1749 01/18/17 0223  TROPONINI 1.45* 2.15*   BNP (last 3 results) No results for input(s): PROBNP in the last 8760 hours. HbA1C:  Recent Labs  01/17/17 2359  HGBA1C 6.3*   CBG:  Recent Labs Lab 01/18/17 0005  GLUCAP 122*   Lipid Profile:  Recent Labs  01/18/17 0223  CHOL 168  HDL 37*  LDLCALC 99  TRIG 158*  CHOLHDL 4.5   Thyroid Function Tests: No results for input(s): TSH, T4TOTAL, FREET4, T3FREE, THYROIDAB in the last 72 hours. Anemia Panel:  Recent Labs  01/18/17 0223  VITAMINB12 599  FOLATE 28.0   Urine analysis:    Component Value Date/Time   COLORURINE YELLOW 01/17/2017 1723   APPEARANCEUR HAZY (A) 01/17/2017 1723   LABSPEC 1.017 01/17/2017 1723   PHURINE 6.0 01/17/2017 1723   GLUCOSEU NEGATIVE 01/17/2017 1723   HGBUR NEGATIVE 01/17/2017 1723   BILIRUBINUR NEGATIVE 01/17/2017 1723   BILIRUBINUR n 11/18/2015 [place] 01/17/2017 1723   PROTEINUR NEGATIVE 01/17/2017 1723   UROBILINOGEN negative 11/18/2015 1325   UROBILINOGEN 0.2 09/02/2014 1847   NITRITE NEGATIVE 01/17/2017 1723   LEUKOCYTESUR NEGATIVE 01/17/2017 1723     Radiology Studies: Reviewed images personally in health database    Scheduled Meds: . aspirin EC  81 mg Oral Daily  . clopidogrel  75 mg Oral Daily   Continuous Infusions: . heparin 1,000 Units/hr (01/18/17 0742)     LOS: 1 day    Time spent: Rowan, MD [facility]   If 7PM-7AM, please contact night-coverage [email or web address] Password [email or web address] 01/18/2017, 7:52 AM

## 2017-01-18 NOTE — Consult Note (Addendum)
Reason for Consult:NSTEMI Referring Physician: Verneita Griffes MD  Nicole Cabrera is an 71 y.o. female.  HPI:   71 y/o female with HTN, Paroxysmal Afib, OSA on CPAP, morbid obesity, transferred to [facility] hospital with concern for slurred speech , headache as well as elevated troponin 2.1 ng/mL. MRI brain results below show PRES vs acute infarcts with petechial hemorrhage.  Patient lives with her husband and performs all her ADL's without difficulty. No regular exercises, but deneis any chest pain, dyspnea, orthopnea, PND, leg swelling at baseline. Denies any chest pain on this admission. EKG with no acute ischemia. She reportedly sees a Film/video editor but cannot recollect the name. Chart review suggested that she sees Dr. Harl Bowie with [facility]. She was recommended to be on anticoagulation for Afib, but she was awaiting colonoscopy for blood in stool before starting anticoagulation.  MRI Brain 01/18/2017: IMPRESSION: 1. Acute small RIGHT parietal infarct. Subacute LEFT patchy parietal infarct with petechial hemorrhage. Though PCA territory infarcts are possible, findings favor sequelae of PRES. 2. Mild to moderate chronic small vessel ischemic disease.  Past Medical History:  Diagnosis Date  . Anxiety    xanax  . Arthritis    shoulders/hips - tx with OTC meds  . Breast cyst 03/1990   pt denies 12/05/13  . Bronchitis   . Cervical tab 1972  . 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 Diabetes mellitus without complication ([place])    stated not diabetic but her AIC has been high so she is monitored closely   . Endometrial polyp 04/2000  . Hemorrhoids   . Hypertension   . Kidney stones   . Obesity   . Osteopenia 05/2007  . Peripheral neuropathy   . PMB (postmenopausal bleeding) 04/2000  . Renal stone    seen on [place] 1/16 or 2/16  . SAB (spontaneous abortion) 1981  . Shortness of breath    with exertion  . Sleep apnea    CPAP  . SVD (spontaneous vaginal delivery) 04/1964, 05/1978   x3  .  TIA (transient ischemic attack)     Past Surgical History:  Procedure Laterality Date  . CERVICAL DISC SURGERY  2/93  . CESAREAN SECTION  6/83   with BTL  . HYSTEROSCOPY  5/02, 01/2011  . LITHOTRIPSY  08/30/2014   with Dr. Jeffie Pollock at [facility]  . TOTAL KNEE ARTHROPLASTY Right 6/08   right  . TOTAL KNEE ARTHROPLASTY Left 4/10   left   . WRIST SURGERY Left 4/07   left    Family History  Problem Relation Age of Onset  . Heart disease Father   . Colon cancer Mother   . Heart disease Mother   . Breast cancer Maternal Aunt   . Colon cancer Maternal Aunt   . Colon cancer Maternal Uncle   . Ehlers-Danlos syndrome Other        neice-being tested 4/15    Social History:  reports that she has never smoked. She has never used smokeless tobacco. She reports that she does not drink alcohol or use drugs.  Allergies:  Allergies  Allergen Reactions  . Bactrim [Sulfamethoxazole-Trimethoprim] Other (See Comments)    Unknown   . Ciprofloxacin Other (See Comments)    GI upset  . Keflet [Cephalexin] Other (See Comments)    GI upset  . Lisinopril Cough  . Spironolactone Nausea Only  . Vistaril [Hydroxyzine Hcl] Other (See Comments)    Pt becomes physchotic.  . Adhesive [Tape] Itching, Rash and Other (See Comments)  Burning  . Erythromycin Rash  . Latex Rash  . Penicillins Rash    Has patient had a PCN reaction causing immediate rash, facial/tongue/throat swelling, SOB or lightheadedness with hypotension: Yes Has patient had a PCN reaction causing severe rash involving mucus membranes or skin necrosis: No Has patient had a PCN reaction that required hospitalization No Has patient had a PCN reaction occurring within the last 10 years: No If all of the above answers are "NO", then may proceed with Cephalosporin use.   . Tetracycline Rash    Medications: I have reviewed the patient's current medications.  Results for orders placed or performed during the hospital encounter of  01/17/17 (from the past 48 hour(s))  Ethanol     Status: None   Collection Time: 01/17/17  4:29 PM  Result Value Ref Range   Alcohol, Ethyl (B) <10 <10 mg/dL    Comment:        LOWEST DETECTABLE LIMIT FOR SERUM ALCOHOL IS 10 mg/dL FOR MEDICAL PURPOSES ONLY   Protime-INR     Status: None   Collection Time: 01/17/17  4:29 PM  Result Value Ref Range   Prothrombin Time 13.3 11.4 - 15.2 seconds   INR 1.02   APTT     Status: None   Collection Time: 01/17/17  4:29 PM  Result Value Ref Range   aPTT 30 24 - 36 seconds  CBC     Status: Abnormal   Collection Time: 01/17/17  4:29 PM  Result Value Ref Range   WBC 9.7 4.0 - 10.5 K/uL   RBC 5.20 (H) 3.87 - 5.11 MIL/uL   Hemoglobin 14.5 12.0 - 15.0 g/dL   HCT 45.6 36.0 - 46.0 %   MCV 87.7 78.0 - 100.0 fL   MCH 27.9 26.0 - 34.0 pg   MCHC 31.8 30.0 - 36.0 g/dL   RDW 14.2 11.5 - 15.5 %   Platelets 299 150 - 400 K/uL  Differential     Status: None   Collection Time: 01/17/17  4:29 PM  Result Value Ref Range   Neutrophils Relative % 62 %   Neutro Abs 6.0 1.7 - 7.7 K/uL   Lymphocytes Relative 27 %   Lymphs Abs 2.6 0.7 - 4.0 K/uL   Monocytes Relative 8 %   Monocytes Absolute 0.8 0.1 - 1.0 K/uL   Eosinophils Relative 3 %   Eosinophils Absolute 0.3 0.0 - 0.7 K/uL   Basophils Relative 0 %   Basophils Absolute 0.0 0.0 - 0.1 K/uL  Comprehensive metabolic panel     Status: Abnormal   Collection Time: 01/17/17  4:29 PM  Result Value Ref Range   Sodium 141 135 - 145 mmol/L   Potassium 3.3 (L) 3.5 - 5.1 mmol/L   Chloride 103 101 - 111 mmol/L   CO2 28 22 - 32 mmol/L   Glucose, Bld 129 (H) 65 - 99 mg/dL   BUN 16 6 - 20 mg/dL   Creatinine, Ser 0.63 0.44 - 1.00 mg/dL   Calcium 9.2 8.9 - 10.3 mg/dL   Total Protein 7.1 6.5 - 8.1 g/dL   Albumin 3.8 3.5 - 5.0 g/dL   AST 34 15 - 41 U/L   ALT 32 14 - 54 U/L   Alkaline Phosphatase 81 38 - 126 U/L   Total Bilirubin 0.5 0.3 - 1.2 mg/dL   GFR calc non Af Amer >60 >60 mL/min   GFR calc Af Amer >60 >60  mL/min    Comment: (NOTE) The eGFR has  been calculated using the CKD EPI equation. This calculation has not been validated in all clinical situations. eGFR's persistently <60 mL/min signify possible Chronic Kidney Disease.    Anion gap 10 5 - 15  I-stat troponin, ED     Status: Abnormal   Collection Time: 01/17/17  4:46 PM  Result Value Ref Range   Troponin i, poc 0.23 (HH) 0.00 - 0.08 ng/mL   Comment MD NOTIFIED, REPEAT TEST    Comment 3            Comment: Due to the release kinetics of cTnI, a negative result within the first hours of the onset of symptoms does not rule out myocardial infarction with certainty. If myocardial infarction is still suspected, repeat the test at appropriate intervals.   I-Stat Chem 8, ED     Status: Abnormal   Collection Time: 01/17/17  4:48 PM  Result Value Ref Range   Sodium 140 135 - 145 mmol/L   Potassium 3.2 (L) 3.5 - 5.1 mmol/L   Chloride 99 (L) 101 - 111 mmol/L   BUN 17 6 - 20 mg/dL   Creatinine, Ser 0.60 0.44 - 1.00 mg/dL   Glucose, Bld 125 (H) 65 - 99 mg/dL   Calcium, Ion 1.12 (L) 1.15 - 1.40 mmol/L   TCO2 27 22 - 32 mmol/L   Hemoglobin 14.6 12.0 - 15.0 g/dL   HCT 43.0 36.0 - 46.0 %  Urine rapid drug screen (hosp performed)     Status: None   Collection Time: 01/17/17  5:23 PM  Result Value Ref Range   Opiates NONE DETECTED NONE DETECTED   Cocaine NONE DETECTED NONE DETECTED   Benzodiazepines NONE DETECTED NONE DETECTED   Amphetamines NONE DETECTED NONE DETECTED   Tetrahydrocannabinol NONE DETECTED NONE DETECTED   Barbiturates NONE DETECTED NONE DETECTED    Comment:        DRUG SCREEN FOR MEDICAL PURPOSES ONLY.  IF CONFIRMATION IS NEEDED FOR ANY PURPOSE, NOTIFY LAB WITHIN 5 DAYS.        LOWEST DETECTABLE LIMITS FOR URINE DRUG SCREEN Drug Class       Cutoff (ng/mL) Amphetamine      1000 Barbiturate      200 Benzodiazepine   111 Tricyclics       552 Opiates          300 Cocaine          300 THC              50    Urinalysis, Routine w reflex microscopic     Status: Abnormal   Collection Time: 01/17/17  5:23 PM  Result Value Ref Range   Color, Urine YELLOW YELLOW   APPearance HAZY (A) CLEAR   Specific Gravity, Urine 1.017 1.005 - 1.030   pH 6.0 5.0 - 8.0   Glucose, UA NEGATIVE NEGATIVE mg/dL   Hgb urine dipstick NEGATIVE NEGATIVE   Bilirubin Urine NEGATIVE NEGATIVE   Ketones, ur NEGATIVE NEGATIVE mg/dL   Protein, ur NEGATIVE NEGATIVE mg/dL   Nitrite NEGATIVE NEGATIVE   Leukocytes, UA NEGATIVE NEGATIVE  Troponin I     Status: Abnormal   Collection Time: 01/17/17  5:49 PM  Result Value Ref Range   Troponin I 1.45 (HH) <0.03 ng/mL    Comment: CRITICAL RESULT CALLED TO, READ BACK BY AND VERIFIED WITH: EVERTTE,R AT 1850 ON 10.28.2018 BY ISLEY,B   MRSA PCR Screening     Status: None   Collection Time: 01/17/17 11:22 PM  Result  Value Ref Range   MRSA by PCR NEGATIVE NEGATIVE    Comment:        The GeneXpert MRSA Assay (FDA approved for NASAL specimens only), is one component of a comprehensive MRSA colonization surveillance program. It is not intended to diagnose MRSA infection nor to guide or monitor treatment for MRSA infections.   Hemoglobin A1c     Status: Abnormal   Collection Time: 01/17/17 11:59 PM  Result Value Ref Range   Hgb A1c MFr Bld 6.3 (H) 4.8 - 5.6 %    Comment: (NOTE) Pre diabetes:          5.7%-6.4% Diabetes:              >6.4% Glycemic control for   <7.0% adults with diabetes    Mean Plasma Glucose 134.11 mg/dL  Glucose, capillary     Status: Abnormal   Collection Time: 01/18/17 12:05 AM  Result Value Ref Range   Glucose-Capillary 122 (H) 65 - 99 mg/dL  Troponin I     Status: Abnormal   Collection Time: 01/18/17  2:23 AM  Result Value Ref Range   Troponin I 2.15 (HH) <0.03 ng/mL    Comment: CRITICAL RESULT CALLED TO, READ BACK BY AND VERIFIED WITH: HAGERMAN C,RN 01/18/17 0430 WAYK   Lipid panel     Status: Abnormal   Collection Time: 01/18/17  2:23 AM   Result Value Ref Range   Cholesterol 168 0 - 200 mg/dL   Triglycerides 158 (H) <150 mg/dL   HDL 37 (L) >40 mg/dL   Total CHOL/HDL Ratio 4.5 RATIO   VLDL 32 0 - 40 mg/dL   LDL Cholesterol 99 0 - 99 mg/dL    Comment:        Total Cholesterol/HDL:CHD Risk Coronary Heart Disease Risk Table                     Men   Women  1/2 Average Risk   3.4   3.3  Average Risk       5.0   4.4  2 X Average Risk   9.6   7.1  3 X Average Risk  23.4   11.0        Use the calculated Patient Ratio above and the CHD Risk Table to determine the patient's CHD Risk.        ATP III CLASSIFICATION (LDL):  <100     mg/dL   Optimal  100-129  mg/dL   Near or Above                    Optimal  130-159  mg/dL   Borderline  160-189  mg/dL   High  >190     mg/dL   Very High   Magnesium     Status: None   Collection Time: 01/18/17  2:23 AM  Result Value Ref Range   Magnesium 1.8 1.7 - 2.4 mg/dL  Ammonia     Status: None   Collection Time: 01/18/17  2:23 AM  Result Value Ref Range   Ammonia 28 9 - 35 umol/L  Vitamin B12     Status: None   Collection Time: 01/18/17  2:23 AM  Result Value Ref Range   Vitamin B-12 599 180 - 914 pg/mL    Comment: (NOTE) This assay is not validated for testing neonatal or myeloproliferative syndrome specimens for Vitamin B12 levels.   Folate     Status: None   Collection Time:  01/18/17  2:23 AM  Result Value Ref Range   Folate 28.0 >5.9 ng/mL  D-dimer, quantitative (not at [facility])     Status: None   Collection Time: 01/18/17  2:32 AM  Result Value Ref Range   D-Dimer, Quant 0.37 0.00 - 0.50 ug/mL-FEU    Comment: (NOTE) At the manufacturer cut-off of 0.50 ug/mL FEU, this assay has been documented to exclude PE with a sensitivity and negative predictive value of 97 to 99%.  At this time, this assay has not been approved by the FDA to exclude DVT/VTE. Results should be correlated with clinical presentation.   Heparin level (unfractionated)     Status: Abnormal    Collection Time: 01/18/17  2:32 AM  Result Value Ref Range   Heparin Unfractionated <0.10 (L) 0.30 - 0.70 IU/mL    Comment:        IF HEPARIN RESULTS ARE BELOW EXPECTED VALUES, AND PATIENT DOSAGE HAS BEEN CONFIRMED, SUGGEST FOLLOW UP TESTING OF ANTITHROMBIN III LEVELS.     Mr Brain Wo Contrast  Result Date: 01/18/2017 CLINICAL DATA:  Word-finding difficulties after lunch yesterday. Confusion. History of hypertension, diabetes. EXAM: MRI HEAD WITHOUT CONTRAST TECHNIQUE: Multiplanar, multiecho pulse sequences of the brain and surrounding structures were obtained without intravenous contrast. COMPARISON:  CT HEAD January 17, 2017 and MRI of the head March 22, 2013 FINDINGS: BRAIN: Reduced diffusion LEFT greater than RIGHT parietal cortex. Relatively normalized ADC values on the LEFT with susceptibility artifact. Low ADC values on the RIGHT. A few scattered chronic micro hemorrhages present. Patchy supratentorial white matter FLAIR T2 hyperintensities. Ventricles and sulci are normal for patient's age. No midline shift, mass effect or masses. Prominent basal ganglia perivascular spaces associated with chronic small vessel ischemic disease. No abnormal extra-axial fluid collections. VASCULAR: Normal major intracranial vascular flow voids present at skull base. SKULL AND UPPER CERVICAL SPINE: No abnormal sellar expansion. No suspicious calvarial bone marrow signal. Craniocervical junction maintained. SINUSES/ORBITS: Trace ethmoid mucosal thickening. Small LEFT mastoid effusion. The included ocular globes and orbital contents are non-suspicious. OTHER: None. IMPRESSION: 1. Acute small RIGHT parietal infarct. Subacute LEFT patchy parietal infarct with petechial hemorrhage. Though PCA territory infarcts are possible, findings favor sequelae of PRES. 2. Mild to moderate chronic small vessel ischemic disease. Electronically Signed   By: Elon Alas M.D.   On: 01/18/2017 01:42   Dg Chest Portable 1  View  Result Date: 01/17/2017 CLINICAL DATA:  Elevated troponin EXAM: PORTABLE CHEST 1 VIEW COMPARISON:  09/14/2016 FINDINGS: Mild cardiomegaly with central vascular congestion. No pleural effusion or focal consolidation. No pneumothorax. IMPRESSION: Cardiomegaly with vascular congestion. Electronically Signed   By: Donavan Foil M.D.   On: 01/17/2017 19:47   Ct Head Code Stroke Wo Contrast  Result Date: 01/17/2017 CLINICAL DATA:  Code stroke. Altered level of consciousness. Could not remember how to get home. EXAM: CT HEAD WITHOUT CONTRAST TECHNIQUE: Contiguous axial images were obtained from the base of the skull through the vertex without intravenous contrast. COMPARISON:  08/08/2015 FINDINGS: Brain: No evidence of acute infarction, hemorrhage, hydrocephalus, extra-axial collection or mass lesion/mass effect. Mild volume loss and chronic small vessel ischemia. Vascular: Atherosclerotic calcification.  No high-density vessel. Skull: Negative for fracture Sinuses/Orbits: Negative Other: These results were called by telephone at the time of interpretation on 01/17/2017 at 4:28 pm to Dr. Virgel Manifold , who verbally acknowledged these results. ASPECTS [facility] Stroke Program Early CT Score) Not scored with this history IMPRESSION: No acute finding or change from 2017. Electronically Signed  By: Monte Fantasia M.D.   On: 01/17/2017 16:29   EKG 01/18/2017 03:37 Sinus rhythm 70 bpm. Left axis deviation First degree AV block Left atrial enlargement Poor R wave progression Frequent PAC's  Echocardiogram 09/28/2016: Study Conclusions  - Left ventricle: The cavity size was normal. Wall thickness was   increased in a pattern of mild LVH. Systolic function was normal.   The estimated ejection fraction was in the range of 60% to 65%.   Wall motion was normal; there were no regional wall motion   abnormalities. Features are consistent with a pseudonormal left   ventricular filling pattern, with  concomitant abnormal relaxation   and increased filling pressure (grade 2 diastolic dysfunction). - Aortic valve: Mildly calcified annulus. Trileaflet. There was   trivial regurgitation. - Mitral valve: Moderately calcified annulus. The findings are   consistent with mild stenosis. There was mild regurgitation. Mean   gradient (D): 3 mm Hg. Valve area by pressure half-time: 1.54   cm^2. - Left atrium: The atrium was moderately dilated. - Right atrium: Central venous pressure (est): 8 mm Hg. - Tricuspid valve: There was trivial regurgitation. - Pulmonary arteries: Systolic pressure could not be accurately   estimated. - Pericardium, extracardiac: There was no pericardial effusion.  Impressions:  - Mild LVH with LVEF 60-65% and grade 2 diastolic dysfunction.   Moderate left atrial enlargement. Moderately calcified mitral   annulus with evidence of mild mitral regurgitation and mild   mitral stenosis. Sclerotic aortic valve with trivial aortic   regurgitation. Trivial tricuspid regurgitation.  Review of Systems  Constitutional: Negative.   HENT: Negative.   Eyes:       Decreased vision in both eyes.  Respiratory: Negative for shortness of breath.   Cardiovascular: Negative for chest pain, orthopnea and leg swelling.  Gastrointestinal: Positive for blood in stool (In the past). Negative for abdominal pain.  Genitourinary: Negative.   Musculoskeletal: Negative.   Skin: Negative.   Neurological: Negative for focal weakness.       Bilateral decreased vision  Endo/Heme/Allergies: Does not bruise/bleed easily.  All other systems reviewed and are negative.  Blood pressure 123/72, pulse 65, temperature 98.1 F (36.7 C), resp. rate (!) 28, height 4' 11.5" (1.511 m), weight 112.9 kg (249 lb), last menstrual period 01/22/2011, SpO2 96 %. Physical Exam  Nursing note and vitals reviewed. Constitutional: She appears well-developed.  Moderately obese  HENT:  Head: Normocephalic and  atraumatic.  Eyes: Pupils are equal, round, and reactive to light.  Neck: No JVD present.  Cardiovascular: Normal rate and regular rhythm.   No murmur heard. Frequent ectopy  Respiratory: Effort normal and breath sounds normal. She has no wheezes. She has no rales. She exhibits no tenderness.  GI: Soft. Bowel sounds are normal. There is no tenderness.  Musculoskeletal: Normal range of motion. She exhibits no edema.  Neurological: She is alert.  Oriented X 2. Bilateral decreased vision, finger counting at 3 ft  Skin: Skin is warm and dry.    Assessment: PRES with acute ischemic strokes (Multifocal) NSTEMI Paroxysmal Afib CHA2DS2VAsc score 6 Hypertension Hyperlipidemia Grade 2 diastolic dysfunction  Plan: Agree with holding heparin given petechial hemorrhage on MRI brain Management of NSTEMI conservative medical at this point given hemorrhage precluding bolus and infusion heparin Continue metoprolol and losartan Consider high intensity statin such as atorvastatin 80 mg or rosuvastatin 40 mg Will await transthoracic echocardiogram.  Etiology of multifocal infarcts most likely paroxysmal Afib. Anticoagulation, when deemed appropriate by neurology. Ideally, would need  DAPT and anticoagulation, however given risk of bleeding (petechial hemorrhage as well as reported blood in stool in the past), consider plavix and warfarin (when appropriate) without aspirin.   Will notify [place] regarding taking over cardiac care.   Manish J Patwardhan 01/18/2017, 7:11 AM   [place], MD [facility] Pager: [phone] Office: [phone] If no answer Cell [phone]

## 2017-01-18 NOTE — Evaluation (Signed)
Clinical/Bedside Swallow Evaluation Patient Details  Name: Nicole Cabrera MRN: [id number] Date of Birth: 09/13/1945  Today's Date: 01/18/2017 Time: SLP Start Time (ACUTE ONLY): 1045 SLP Stop Time (ACUTE ONLY): 1059 SLP Time Calculation (min) (ACUTE ONLY): 14 min  Past Medical History:  Past Medical History:  Diagnosis Date  . Anxiety    xanax  . Arthritis    shoulders/hips - tx with OTC meds  . Breast cyst 03/1990   pt denies 12/05/13  . Bronchitis   . Cervical tab 1972  . 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 Diabetes mellitus without complication ([place])    stated not diabetic but her AIC has been high so she is monitored closely   . Endometrial polyp 04/2000  . Hemorrhoids   . Hypertension   . Kidney stones   . Obesity   . Osteopenia 05/2007  . Peripheral neuropathy   . PMB (postmenopausal bleeding) 04/2000  . Renal stone    seen on [place] 1/16 or 2/16  . SAB (spontaneous abortion) 1981  . Shortness of breath    with exertion  . Sleep apnea    CPAP  . SVD (spontaneous vaginal delivery) 04/1964, 05/1978   x3  . TIA (transient ischemic attack)    Past Surgical History:  Past Surgical History:  Procedure Laterality Date  . CERVICAL DISC SURGERY  2/93  . CESAREAN SECTION  6/83   with BTL  . HYSTEROSCOPY  5/02, 01/2011  . LITHOTRIPSY  08/30/2014   with Dr. Jeffie Pollock at [facility]  . TOTAL KNEE ARTHROPLASTY Right 6/08   right  . TOTAL KNEE ARTHROPLASTY Left 4/10   left   . WRIST SURGERY Left 4/07   left   HPI:  71 y.o. female with HTN, PAF, OSA on CPAP presented to [facility] with confusion, dysarthria, HA.  Transferred to [facility] for concerns for NSTEMI. Dx PRES with acute ischemic strokes, bilateral vision loss, NSTEMI. MRI: acute small right parietal infarct, subacute left parietal infarct with petechial hemorrhage.     Assessment / Plan / Recommendation Clinical Impression  Pt presents with normal oropharyngeal swallow function with adequate mastication, brisk  swallow response, no overt s/s of aspiration.  Speech clarity is improving.  Recommend regular solids, thin liquids; no SLP f/u is warranted - our services will sign off.  SLP Visit Diagnosis: Dysphagia, unspecified (R13.10)    Aspiration Risk  No limitations    Diet Recommendation     Medication Administration: Whole meds with liquid    Other  Recommendations Oral Care Recommendations: Oral care BID   Follow up Recommendations None      Frequency and Duration            Prognosis        Swallow Study   General Date of Onset: 01/17/17 HPI: 71 y.o. female with HTN, PAF, OSA on CPAP presented to [facility] with confusion, dysarthria, HA.  Transferred to [facility] for concerns for NSTEMI. Dx PRES with acute ischemic strokes, bilateral vision loss, NSTEMI. MRI: acute small right parietal infarct, subacute left parietal infarct with petechial hemorrhage.   Type of Study: Bedside Swallow Evaluation Previous Swallow Assessment: no Diet Prior to this Study: NPO Temperature Spikes Noted: No Respiratory Status: Room air History of Recent Intubation: No Behavior/Cognition: Alert;Cooperative Oral Cavity Assessment: Within Functional Limits Oral Care Completed by SLP: No Oral Cavity - Dentition: Adequate natural dentition Vision: Impaired for self-feeding Self-Feeding Abilities: Able to feed self Patient Positioning: Upright in bed Baseline Vocal Quality: Normal Volitional  Cough: Strong Volitional Swallow: Able to elicit    Oral/Motor/Sensory Function Overall Oral Motor/Sensory Function: Within functional limits   Ice Chips Ice chips: Within functional limits   Thin Liquid Thin Liquid: Within functional limits    Nectar Thick Nectar Thick Liquid: Not tested   Honey Thick Honey Thick Liquid: Not tested   Puree Puree: Within functional limits   Solid   GO   Solid: Within functional limits        Juan Quam Laurice 01/18/2017,11:23 AM  Estill Bamberg L. Tivis Ringer, [place] CCC/SLP Pager  [phone]

## 2017-01-18 NOTE — Progress Notes (Signed)
Discussed with Dr Einar Gip- pt is followed by Dr Harl Bowie. We will pick up pt on rounds 01/19/17.  Kerin Ransom PA-C 01/18/2017 8:35 AM

## 2017-01-18 NOTE — Progress Notes (Signed)
Visited with patient who is currently on BiPAP.  Patient states she has hardly had any rest and would like to talk but would like to rest right now.  Chaplain shared with patient to please let nurse know if she would like another visit from [place] and we would be happy to return and chat with her at a more convenient time.      01/18/17 1136  Clinical Encounter Type  Visited With Patient  Visit Type Initial;Psychological support;Spiritual support;Social support

## 2017-01-18 NOTE — Progress Notes (Signed)
  Echocardiogram 2D Echocardiogram has been performed.  [name] 01/18/2017, 8:38 AM

## 2017-01-18 NOTE — Progress Notes (Signed)
This MD notified cardiology about troponin trending up. No change in current management. We continue to closely monitor.

## 2017-01-18 NOTE — Plan of Care (Signed)
Problem: Nutrition: Goal: Risk of aspiration will decrease Outcome: Adequate for Discharge Patient passed swallow screen, placed on heart healthy diet

## 2017-01-18 NOTE — Progress Notes (Signed)
This MD spoke with cardiologist on call who recommended to stop heparin drip and start plavix and ASA. No need for D-dimer/CTA. Cardiology will see this morning. We appreciate recommendations.

## 2017-01-18 NOTE — Progress Notes (Signed)
Dr.Hall made aware of MRI report results are posted.

## 2017-01-18 NOTE — Progress Notes (Signed)
STROKE TEAM PROGRESS NOTE   SUBJECTIVE (INTERVAL HISTORY) Her  son is at the bedside.   Overall she feels her condition is unchanged. Voices no new complaints. No new events reported overnight.  Son states his mother did not start her new Plavix prescription because she was waiting to have a colonoscopy.  OBJECTIVE  Recent Labs Lab 01/18/17 0005  GLUCAP 122*    Recent Labs Lab 01/17/17 1629 01/17/17 1648 01/18/17 0223 01/18/17 0745  NA 141 140  --  136  K 3.3* 3.2*  --  3.5  CL 103 99*  --  103  CO2 28  --   --  24  GLUCOSE 129* 125*  --  108*  BUN 16 17  --  9  CREATININE 0.63 0.60  --  0.61  CALCIUM 9.2  --   --  8.5*  MG  --   --  1.8  --     Recent Labs Lab 01/17/17 1629 01/18/17 0745  AST 34 41  ALT 32 33  ALKPHOS 81 81  BILITOT 0.5 0.8  PROT 7.1 6.4*  ALBUMIN 3.8 3.3*    Recent Labs Lab 01/17/17 1629 01/17/17 1648 01/18/17 0745  WBC 9.7  --  10.3  NEUTROABS 6.0  --   --   HGB 14.5 14.6 14.3  HCT 45.6 43.0 43.1  MCV 87.7  --  86.2  PLT 299  --  313    Recent Labs Lab 01/17/17 1749 01/18/17 0223 01/18/17 0745 01/18/17 1151  TROPONINI 1.45* 2.15* 1.60* 0.96*    Recent Labs  01/17/17 1629  LABPROT 13.3  INR 1.02    Recent Labs  01/17/17 1723  COLORURINE YELLOW  LABSPEC 1.017  PHURINE 6.0  GLUCOSEU NEGATIVE  HGBUR NEGATIVE  BILIRUBINUR NEGATIVE  KETONESUR NEGATIVE  PROTEINUR NEGATIVE  NITRITE NEGATIVE  LEUKOCYTESUR NEGATIVE       Component Value Date/Time   CHOL 168 01/18/2017 0223   TRIG 158 (H) 01/18/2017 0223   HDL 37 (L) 01/18/2017 0223   CHOLHDL 4.5 01/18/2017 0223   VLDL 32 01/18/2017 0223   LDLCALC 99 01/18/2017 0223   Lab Results  Component Value Date   HGBA1C 6.3 (H) 01/17/2017      Component Value Date/Time   LABOPIA NONE DETECTED 01/17/2017 1723   COCAINSCRNUR NONE DETECTED 01/17/2017 1723   LABBENZ NONE DETECTED 01/17/2017 1723   AMPHETMU NONE DETECTED 01/17/2017 1723   THCU NONE DETECTED  01/17/2017 1723   LABBARB NONE DETECTED 01/17/2017 1723     Recent Labs Lab 01/17/17 1629  ETH <10    IMAGING: I have personally reviewed the radiological images below and agree with the radiology interpretations.  Ct Angio Head W Or Wo Contrast Result Date: 01/18/2017  IMPRESSION: No significant intracranial stenosis Recent small infarcts in the left occipital parietal lobe and right occipital lobe without hemorrhage.   Mr Brain Wo Contrast Result Date: 01/18/2017 IMPRESSION: 1. Acute small RIGHT parietal infarct. Subacute LEFT patchy parietal infarct with petechial hemorrhage. Though PCA territory infarcts are possible, findings favor sequelae of PRES. 2. Mild to moderate chronic small vessel ischemic disease.   Ct Head Code Stroke Wo Contrast Result Date: 01/17/2017 FINDINGS: Brain: No evidence of acute infarction, hemorrhage, hydrocephalus, extra-axial collection or mass lesion/mass effect. Mild volume loss and chronic small vessel ischemia. Vascular: Atherosclerotic calcification.  No high-density vessel. Skull: Negative for fracture Sinuses/Orbits: Negative Other:    2D Echo Left ventricle: The cavity size was normal. There was moderate concentric hypertrophy.  Systolic function was normal. The estimated ejection fraction was in the range of 55% to 60%. Wall motion was normal; there were no regional wall motion abnormalities. Diastolic function assessment limited in the setting of mitral annular calcification. Mitral valve: Moderate annular and leaflet calcification with mildly restricted movement. Unchanged compared to prior echocardiogram on  09/28/2016. No other significant valvular abnormality. Left atrium: The atrium was moderately dilated. No other significant valvular abnormality. No significant change compared to prior echocardiogram 09/28/2016.   B/L Carotid Doppler:         PENDING   PHYSICAL EXAM Temp:  [98 F (36.7 C)-98.5 F (36.9 C)] 98 F (36.7 C) (10/29  1307) Pulse Rate:  [33-87] 74 (10/29 1307) Resp:  [15-28] 24 (10/29 1307) BP: (107-172)/(62-137) 152/84 (10/29 1307) SpO2:  [90 %-98 %] 98 % (10/29 1307) Weight:  [112.9 kg (249 lb)] 112.9 kg (249 lb) (10/28 1607)  General - Well nourished, well developed elderly lady, in no apparent distress Respiratory - No labored breathing noted on exam Cardiovascular - Regular rate and rhythm  Mental Status -  Level of arousal and orientation to time, place, and person were intact. Language - +word finding difficulties/recall, Attention span and concentration was poor, but answered all examiners questions Recent and remote memory appears intact Fund of Knowledge was assessed, appears intact  Cranial Nerves II - XII - II - Right field cut. Easily identifies objects. III, IV, VI - Extraocular movements intact. V - Facial sensation appears intact bilaterally. VII - Facial movement appears intact bilaterally VIII - Hearing & vestibular appears intact bilaterally X - Palate elevates symmetrically XI - Chin turning & shoulder shrug intact bilaterally. XII - Tongue protrusion intact  Motor: Right :  Upper extremity   4/5, difficulty with fine motor                         movements                                     Left:     Upper extremity   4/5             Lower extremity   4/5                                                   Lower extremity   4/5 Tone and bulk:normal tone throughout; no atrophy noted Sensory: light touch intact grossly throughout, bilaterally  ASSESSMENT AND PLAN: Ms. ATHALENE KOLLE is a 71 y.o. female with PMH of  HTN, Paroxysmal Afib, OSA on CPAP and morbid obesity admitted for presentation of Migraine-like symptoms slurred speech and H/A per Tele Neurology at [facility].  Transferred to [facility] for elevated and trending troponins with concern for NSTEMI. MRI Brain was obtained which showed acute and subacute infarcts parietal infarcts with petechial hemorrhage.  Neurology was consulted for acute stroke  Acute small Right parietal infarct. Subacute Left patchy parietal infarct with petechial hemorrhage: Likely cardio-embolic secondary to MI and AFIB   Resultant:   +word finding difficulties/recall, Right field cut and Right sided weakness   MRI:  Acute small RIGHT parietal infarct. Subacute LEFT patchy parietal infarct with petechial hemorrhage.   CTA Head:                 small infarcts in the left occipital parietal lobe and right occipital lobe without hemorrhage.    CT Head:    Negative  2D Echo      LVEF 55-60 %. No significant change compared to prior echocardiogram 09/28/2016.    B/L Carotid Doppler:         PENDING   LDL:  99  HgbA1c:  6.3   VTE prophylaxis: SCD's / Lovenox / Heparin /Xarelto  Diet Heart Room service appropriate? Yes; Fluid consistency: Thin    aspirin 81 mg daily prior to admission, now on clopidogrel 75 mg daily and ASA 81 mg daily.    Patient counseled to be compliant with her antithrombotic medications        Therapy recommendations:  PENDING       Disposition: Likely CIR            Recommendations:   Discontinue Heparin when safe to do so per Cardiology   Continue Plavix and ASA 81 mg of now. Will consider starting Eliquis BID in a few days due to bleeding risk, will closely monitor patients continued improvement in exam.  Ongoing aggressive stroke risk factor management        Follow up Appointments:    PCP follow up   Follow up with [facility], Dr Leonie Man in 6 weeks  NSTEMI:  Management per Cardiology  Troponin's trending up  Paroxysmal AFIB:  Will consider starting Eliquis BID in a few days. Will closely monitor patients exam.  Hypertension: Stable, some elevated B/P's noted overnight. Permissive hypertension (OK if <220/120) for 24-48 hours post stroke and then gradually normalized within 5-7 days.  Long term BP goal normotensive. May  slowly restart home B/P medications after 48 hours with close PCP follow up.  Hyperlipidemia:  Home meds:  NONE  LDL     99, goal < 70  Now on  Lipitor to 40 mg daily  Continue statin at discharge  Other Stroke Risk Factors:  Advanced age  Obesity, Body mass index is 49.45 kg/m.   Hx TIA's x 3  Migraines  Obstructive sleep apnea, on CPAP at home   Hospital day # 1   Renie Ora Stroke Neurology Team 01/18/2017 3:57 PM I have personally examined this patient, reviewed notes, independently viewed imaging studies, participated in medical decision making and plan of care.ROS completed by me personally and pertinent positives fully documented  I have made any additions or clarifications directly to the above note. Agree with note above.  She presented with aphasia and right-sided weakness in setting of atrial fibrillation and acute non-ST elevation myocardial infarction. She remains at risk for recurrent stroke and neurological worsening. Agree with short-term anticoagulation with IV heparin but she will eventually need long-term anticoagulation with eliquis after 3-5 days. Cardiology following for acute MI. Discuss with Dr. Verlon Au.This patient is critically ill and at significant risk of neurological worsening, death and care requires constant monitoring of vital signs, hemodynamics,respiratory and cardiac monitoring, extensive review of multiple databases, frequent neurological assessment, discussion with family, other specialists and medical decision making of high complexity.I have made any additions or clarifications directly to the above note.This critical care time does not reflect procedure time, or teaching time or supervisory time of PA/NP/Med Resident etc but could involve care discussion  time.  I spent 30 minutes of neurocritical care time  in the care of  this patient.      Antony Contras, MD Medical Director [facility] Pager:  [phone] 01/18/2017 5:21 PM  To contact Stroke Continuity provider, please refer to [email or web address] After hours, contact General Neurology

## 2017-01-18 NOTE — Progress Notes (Addendum)
Troponin 2.15 from 1.45, neurology ok with anticoagulation. Will resume heparin drip for suspected NSTEMI vs demand ischemia. Pharmacy notified not to use high dose heparin due to intracranial petechiae. Neurology, cardiology, and pharmacy consulted. Suspected PRES as advised by neurology, control BP. NPO for possible cath this morning and until passes swallow eval by speech therapy if no planned procedures. Continue close monitoring.

## 2017-01-18 NOTE — Progress Notes (Signed)
Pt seen as she is being taken by RN to radiology for MRI brain; appears to have left side facial droopiness. Unable to do complete neuro exam as pt is being wheeled and in transit to radiology. Will continue close following. NPO until passes swallow eval by speech therapy. Fall precaution.

## 2017-01-18 NOTE — Progress Notes (Signed)
RN called about MRI brain results: 1. Acute small RIGHT parietal infarct. Subacute LEFT patchy parietal infarct with petechial hemorrhage. Though PCA territory infarcts are possible, findings favor sequelae of PRES. 2. Mild to moderate chronic small vessel ischemic   Neurology consulted. Pt on heparin drip- will consult pharmacy to monitor heparin drip.   Troponin 1.45. EKG reviewed and show no ST T elevation. Repeat EKG and cycle troponins. D-dimer/CTA ordered to r/o PE. Cardiology will be consulted. Continue close monitoring.

## 2017-01-18 NOTE — Consult Note (Signed)
Requesting Physician: Dr. Nevada Crane    Chief Complaint: Headache  History obtained from:  Patient   Chart   HPI:                                                                                                                                       Nicole Cabrera is an 71 y.o. female with HTN, Paroxysmal Afib, OSA on CPAP, morbid obesity presents to [facility] with slurred speech and headache yesterday. Evaluated by [facility] who felt this was likely migraine. Transferred to [facility] for elevated and trending troponins with concern for NSTEMI.  MRI Brain was obtained which showed acute and subacute infarcts parietal infarcts with petechial hemorrhage. Neurology was consulted for acute stroke.   Date last known well: 10.28.18 Time last known well: 1pm tPA Given: no Baseline MRS 0     Past Medical History:  Diagnosis Date  . Anxiety    xanax  . Arthritis    shoulders/hips - tx with OTC meds  . Breast cyst 03/1990   pt denies 12/05/13  . Bronchitis   . Cervical tab 1972  . 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 Diabetes mellitus without complication ([place])    stated not diabetic but her AIC has been high so she is monitored closely   . Endometrial polyp 04/2000  . Hemorrhoids   . Hypertension   . Kidney stones   . Obesity   . Osteopenia 05/2007  . Peripheral neuropathy   . PMB (postmenopausal bleeding) 04/2000  . Renal stone    seen on [place] 1/16 or 2/16  . SAB (spontaneous abortion) 1981  . Shortness of breath    with exertion  . Sleep apnea    CPAP  . SVD (spontaneous vaginal delivery) 04/1964, 05/1978   x3  . TIA (transient ischemic attack)     Past Surgical History:  Procedure Laterality Date  . CERVICAL DISC SURGERY  2/93  . CESAREAN SECTION  6/83   with BTL  . HYSTEROSCOPY  5/02, 01/2011  . LITHOTRIPSY  08/30/2014   with Dr. Jeffie Pollock at [facility]  . TOTAL KNEE ARTHROPLASTY Right 6/08   right  . TOTAL KNEE ARTHROPLASTY Left 4/10   left   . WRIST  SURGERY Left 4/07   left    Family History  Problem Relation Age of Onset  . Heart disease Father   . Colon cancer Mother   . Heart disease Mother   . Breast cancer Maternal Aunt   . Colon cancer Maternal Aunt   . Colon cancer Maternal Uncle   . Ehlers-Danlos syndrome Other        neice-being tested 4/15   Social History:  reports that she has never smoked. She has never used smokeless tobacco. She reports that she does not drink alcohol or use drugs.  Allergies:  Allergies  Allergen Reactions  . Bactrim [Sulfamethoxazole-Trimethoprim] Other (See  Comments)    Unknown   . Ciprofloxacin Other (See Comments)    GI upset  . Keflet [Cephalexin] Other (See Comments)    GI upset  . Lisinopril Cough  . Spironolactone Nausea Only  . Vistaril [Hydroxyzine Hcl] Other (See Comments)    Pt becomes physchotic.  . Adhesive [Tape] Itching, Rash and Other (See Comments)    Burning  . Erythromycin Rash  . Latex Rash  . Penicillins Rash    Has patient had a PCN reaction causing immediate rash, facial/tongue/throat swelling, SOB or lightheadedness with hypotension: Yes Has patient had a PCN reaction causing severe rash involving mucus membranes or skin necrosis: No Has patient had a PCN reaction that required hospitalization No Has patient had a PCN reaction occurring within the last 10 years: No If all of the above answers are "NO", then may proceed with Cephalosporin use.   . Tetracycline Rash    Medications:                                                                                                                        I reviewed home medications   ROS:                                                                                                                                     14 systems reviewed and negative except above    Examination:                                                                                                      General: Appears well-developed  and well-nourished.  Psych: Affect appropriate to situation Eyes: No scleral injection HENT: No OP obstrucion Head: Normocephalic.  Cardiovascular: Normal rate and regular rhythm.  Respiratory: Effort normal and breath sounds normal to anterior ascultation GI: Soft.  No distension. There is no tenderness.  Skin: WDI   Neurological Examination Mental Status: Alert confused.  Unable to answer date, age, location, reason for hospitalization.  Mild aphasia, dysarthria.  Cranial Nerves: Visual fields: unable to recognize objects, guesses on finger counting in both eyes No obvious facial droop Tongue midline.   Motor: Right : Upper extremity   4/5    Left:     Upper extremity   4/5  Lower extremity   4/5     Lower extremity   4/5 Tone and bulk:normal tone throughout; no atrophy noted Sensory: Pinprick and light touch intact grossly throughout, bilaterally Deep Tendon Reflexes: 2+ and symmetric throughout Plantars: Right: downgoing   Left: downgoing Cerebellar: No obvious finger to nose ataxia, unable to FN test likely 2.2 visual loss Gait: normal gait and station     Lab Results: Basic Metabolic Panel:  Recent Labs Lab 01/17/17 1629 01/17/17 1648  NA 141 140  K 3.3* 3.2*  CL 103 99*  CO2 28  --   GLUCOSE 129* 125*  BUN 16 17  CREATININE 0.63 0.60  CALCIUM 9.2  --     CBC:  Recent Labs Lab 01/17/17 1629 01/17/17 1648  WBC 9.7  --   NEUTROABS 6.0  --   HGB 14.5 14.6  HCT 45.6 43.0  MCV 87.7  --   PLT 299  --     Coagulation Studies:  Recent Labs  01/17/17 1629  LABPROT 13.3  INR 1.02    Imaging: Mr Brain Wo Contrast  Result Date: 01/18/2017 CLINICAL DATA:  Word-finding difficulties after lunch yesterday. Confusion. History of hypertension, diabetes. EXAM: MRI HEAD WITHOUT CONTRAST TECHNIQUE: Multiplanar, multiecho pulse sequences of the brain and surrounding structures were obtained without intravenous contrast. COMPARISON:  CT HEAD January 17, 2017 and MRI of the head March 22, 2013 FINDINGS: BRAIN: Reduced diffusion LEFT greater than RIGHT parietal cortex. Relatively normalized ADC values on the LEFT with susceptibility artifact. Low ADC values on the RIGHT. A few scattered chronic micro hemorrhages present. Patchy supratentorial white matter FLAIR T2 hyperintensities. Ventricles and sulci are normal for patient's age. No midline shift, mass effect or masses. Prominent basal ganglia perivascular spaces associated with chronic small vessel ischemic disease. No abnormal extra-axial fluid collections. VASCULAR: Normal major intracranial vascular flow voids present at skull base. SKULL AND UPPER CERVICAL SPINE: No abnormal sellar expansion. No suspicious calvarial bone marrow signal. Craniocervical junction maintained. SINUSES/ORBITS: Trace ethmoid mucosal thickening. Small LEFT mastoid effusion. The included ocular globes and orbital contents are non-suspicious. OTHER: None. IMPRESSION: 1. Acute small RIGHT parietal infarct. Subacute LEFT patchy parietal infarct with petechial hemorrhage. Though PCA territory infarcts are possible, findings favor sequelae of PRES. 2. Mild to moderate chronic small vessel ischemic disease. Electronically Signed   By: Elon Alas M.D.   On: 01/18/2017 01:42   Dg Chest Portable 1 View  Result Date: 01/17/2017 CLINICAL DATA:  Elevated troponin EXAM: PORTABLE CHEST 1 VIEW COMPARISON:  09/14/2016 FINDINGS: Mild cardiomegaly with central vascular congestion. No pleural effusion or focal consolidation. No pneumothorax. IMPRESSION: Cardiomegaly with vascular congestion. Electronically Signed   By: Donavan Foil M.D.   On: 01/17/2017 19:47   Ct Head Code Stroke Wo Contrast  Result Date: 01/17/2017 CLINICAL DATA:  Code stroke. Altered level of consciousness. Could not remember how to get home. EXAM: CT HEAD WITHOUT CONTRAST TECHNIQUE: Contiguous axial images were obtained from the base of the skull through the  vertex without intravenous contrast. COMPARISON:  08/08/2015 FINDINGS: Brain: No evidence of acute infarction, hemorrhage, hydrocephalus, extra-axial collection or mass lesion/mass effect. Mild volume loss and chronic small vessel ischemia. Vascular: Atherosclerotic calcification.  No high-density vessel. Skull: Negative for fracture Sinuses/Orbits: Negative Other: These results were called by telephone at the time of interpretation on 01/17/2017 at 4:28 pm to Dr. Virgel Manifold , who verbally acknowledged these results. ASPECTS [facility] Stroke Program Early CT Score) Not scored with this history IMPRESSION: No acute finding or change from 2017. Electronically Signed   By: Monte Fantasia M.D.   On: 01/17/2017 16:29     ASSESSMENT AND PLAN   POSTERIOR REVERSIBLE ENCEPHALOPATHY SYNDROME (PRES)  with Acute Ischemic Strokes Bilateral vision loss  Recommendations Strict BP management - goal less than 140/90 Neurochecks and NIHSS Q2h Repeat CT head Heparin drip held after MRI demonstrated strokes and petechial hemorrhage.  #MRA Head and neck  #Transthoracic Echo  #Start or continue Atorvastatin 80 mg/other high intensity statin # HBAIC and Lipid profile # Telemetry monitoring # NPO until passes stroke swallow screen # Routine EEG  Please page stroke NP  Or  PA  Or MD from 8am -4 pm  as this patient from this time will be  followed by the stroke.   You can look them up on [email or web address]  Password [email or web address]    NSTEMI Patient has upward trending tropinins Heparin drip stopped after MRI Brain showed infarcts with petechial hemorrhage.   Discussed with Cardiology fellow. Restarting heparin will be based on risk of hemorrhagic conversion vs benefit of anticoagulation for treatment of suspected MI. Will repeat CT head to assess for worsening PRES/hemorrhage. IF heparin drip needs to be restarted, please do NON BOLUS low intensity protocol.  Paroxysmal Afib Patient has high CHADSVASC score of 5-6 and  needs long term anticoagulation, preferably with DOAC Timing of AC usually will be 3-7 days after stroke However, given concerns of MI    Sushanth Aroor [facility] Pager Number [phone]

## 2017-01-18 NOTE — Progress Notes (Signed)
MD notified about elevated troponin 2.15. Patient stable denies cp at this time I will continue to monitor.

## 2017-01-19 ENCOUNTER — Ambulatory Visit: Payer: 59 | Admitting: Physician Assistant

## 2017-01-19 ENCOUNTER — Inpatient Hospital Stay (HOSPITAL_COMMUNITY): Payer: 59

## 2017-01-19 DIAGNOSIS — R748 Abnormal levels of other serum enzymes: Secondary | ICD-10-CM

## 2017-01-19 DIAGNOSIS — I2510 Atherosclerotic heart disease of native coronary artery without angina pectoris: Secondary | ICD-10-CM

## 2017-01-19 DIAGNOSIS — I6349 Cerebral infarction due to embolism of other cerebral artery: Secondary | ICD-10-CM

## 2017-01-19 LAB — BASIC METABOLIC PANEL
Anion gap: 6 (ref 5–15)
BUN: 7 mg/dL (ref 6–20)
CO2: 25 mmol/L (ref 22–32)
Calcium: 8.3 mg/dL — ABNORMAL LOW (ref 8.9–10.3)
Chloride: 104 mmol/L (ref 101–111)
Creatinine, Ser: 0.6 mg/dL (ref 0.44–1.00)
GFR calc Af Amer: >60 mL/min (ref >60)
GFR calc non Af Amer: >60 mL/min (ref >60)
Glucose, Bld: 104 mg/dL — ABNORMAL HIGH (ref 65–99)
Potassium: 3.2 mmol/L — ABNORMAL LOW (ref 3.5–5.1)
Sodium: 135 mmol/L (ref 135–145)

## 2017-01-19 LAB — CBC WITH DIFFERENTIAL/PLATELET
Basophils Absolute: 0 10*3/uL (ref 0.0–0.1)
Basophils Relative: 0 %
Eosinophils Absolute: 0.3 10*3/uL (ref 0.0–0.7)
Eosinophils Relative: 3 %
HCT: 42.1 % (ref 36.0–46.0)
Hemoglobin: 13.7 g/dL (ref 12.0–15.0)
Lymphocytes Relative: 33 %
Lymphs Abs: 3.2 10*3/uL (ref 0.7–4.0)
MCH: 28 pg (ref 26.0–34.0)
MCHC: 32.5 g/dL (ref 30.0–36.0)
MCV: 85.9 fL (ref 78.0–100.0)
Monocytes Absolute: 0.7 10*3/uL (ref 0.1–1.0)
Monocytes Relative: 7 %
Neutro Abs: 5.6 10*3/uL (ref 1.7–7.7)
Neutrophils Relative %: 57 %
Platelets: 264 10*3/uL (ref 150–400)
RBC: 4.9 MIL/uL (ref 3.87–5.11)
RDW: 14.4 % (ref 11.5–15.5)
WBC: 9.8 10*3/uL (ref 4.0–10.5)

## 2017-01-19 LAB — HEPARIN LEVEL (UNFRACTIONATED)
Heparin Unfractionated: 0.31 IU/mL (ref 0.30–0.70)
Heparin Unfractionated: 0.28 IU/mL — ABNORMAL LOW (ref 0.30–0.70)

## 2017-01-19 MED ORDER — METOPROLOL TARTRATE 5 MG/5ML IV SOLN
5.0000 mg | INTRAVENOUS | Status: DC | PRN
  Administered 2017-01-19: 5 mg via INTRAVENOUS
  Filled 2017-01-19: qty 5

## 2017-01-19 MED ORDER — POTASSIUM CHLORIDE CRYS ER 20 MEQ PO TBCR
40.0000 meq | EXTENDED_RELEASE_TABLET | Freq: Every day | ORAL | Status: DC
  Administered 2017-01-19 – 2017-01-21 (×3): 40 meq via ORAL
  Filled 2017-01-19 (×3): qty 2

## 2017-01-19 MED ORDER — METOPROLOL TARTRATE 5 MG/5ML IV SOLN
INTRAVENOUS | Status: AC
  Filled 2017-01-19: qty 5

## 2017-01-19 MED ORDER — ALPRAZOLAM 0.25 MG PO TABS
0.2500 mg | ORAL_TABLET | Freq: Every day | ORAL | Status: DC | PRN
Start: 2017-01-19 — End: 2017-01-21

## 2017-01-19 MED ORDER — NITROGLYCERIN 0.4 MG SL SUBL
0.8000 mg | SUBLINGUAL_TABLET | Freq: Once | SUBLINGUAL | Status: AC
  Administered 2017-01-19: 0.8 mg via SUBLINGUAL

## 2017-01-19 MED ORDER — NITROGLYCERIN 0.4 MG SL SUBL
SUBLINGUAL_TABLET | SUBLINGUAL | Status: AC
Start: 2017-01-19 — End: 2017-01-20
  Filled 2017-01-19: qty 2

## 2017-01-19 MED ORDER — IOPAMIDOL (ISOVUE-370) INJECTION 76%
INTRAVENOUS | Status: AC
  Administered 2017-01-19: 100 mL
  Filled 2017-01-19: qty 100

## 2017-01-19 NOTE — Progress Notes (Signed)
Progress Note  Patient Name: Nicole Cabrera Date of Encounter: 01/19/2017  Primary Cardiologist: Dr. Harl Bowie  Subjective   Denies any chest pain or pressure this am.  No SOB  Inpatient Medications    Scheduled Meds: . aspirin EC  81 mg Oral Daily  . atorvastatin  40 mg Oral q1800  . clopidogrel  75 mg Oral Daily  . metoprolol tartrate  2.5 mg Intravenous Q6H  . potassium chloride  40 mEq Oral Daily   Continuous Infusions: . heparin 1,250 Units/hr (01/19/17 0818)   PRN Meds: acetaminophen, albuterol, ALPRAZolam   Vital Signs    Vitals:   01/18/17 1929 01/18/17 2329 01/19/17 0330 01/19/17 0749  BP: (!) 156/82 (!) 149/81 (!) 151/76 (!) 161/78  Pulse: 68  71 73  Resp: (!) 24 (!) 30 (!) 25 (!) 27  Temp: 98 F (36.7 C) 98.1 F (36.7 C) 98.6 F (37 C) 98.8 F (37.1 C)  TempSrc: Oral Oral Oral Oral  SpO2: 92% 94% 92% 93%  Weight:      Height:        Intake/Output Summary (Last 24 hours) at 01/19/17 0941 Last data filed at 01/19/17 0500  Gross per 24 hour  Intake           709.85 ml  Output              250 ml  Net           459.85 ml   Filed Weights   01/17/17 1607  Weight: 249 lb (112.9 kg)    Telemetry    NSR - Personally Reviewed  ECG    No new EKG to review - Personally Reviewed  Physical Exam   GEN: No acute distress.   Neck: No JVD Cardiac: RRR, no murmurs, rubs, or gallops.  Respiratory: Clear to auscultation bilaterally. GI: Soft, nontender, non-distended  MS: No edema; No deformity. Neuro:  Nonfocal  Psych: Normal affect   Labs    Chemistry Recent Labs Lab 01/17/17 1629 01/17/17 1648 01/18/17 0745 01/19/17 0214  NA 141 140 136 135  K 3.3* 3.2* 3.5 3.2*  CL 103 99* 103 104  CO2 28  --  24 25  GLUCOSE 129* 125* 108* 104*  BUN 16 17 9 7   CREATININE 0.63 0.60 0.61 0.60  CALCIUM 9.2  --  8.5* 8.3*  PROT 7.1  --  6.4*  --   ALBUMIN 3.8  --  3.3*  --   AST 34  --  41  --   ALT 32  --  33  --   ALKPHOS 81  --  81  --     BILITOT 0.5  --  0.8  --   GFRNONAA >60  --  >60 >60  GFRAA >60  --  >60 >60  ANIONGAP 10  --  9 6     Hematology Recent Labs Lab 01/17/17 1629 01/17/17 1648 01/18/17 0745 01/19/17 0214  WBC 9.7  --  10.3 9.8  RBC 5.20*  --  5.00 4.90  HGB 14.5 14.6 14.3 13.7  HCT 45.6 43.0 43.1 42.1  MCV 87.7  --  86.2 85.9  MCH 27.9  --  28.6 28.0  MCHC 31.8  --  33.2 32.5  RDW 14.2  --  14.2 14.4  PLT 299  --  313 264    Cardiac Enzymes Recent Labs Lab 01/17/17 1749 01/18/17 0223 01/18/17 0745 01/18/17 1151  TROPONINI 1.45* 2.15* 1.60* 0.96*  Recent Labs Lab 01/17/17 1646  TROPIPOC 0.23*     BNPNo results for input(s): BNP, PROBNP in the last 168 hours.   DDimer  Recent Labs Lab 01/18/17 0232  DDIMER 0.37     Radiology    Ct Angio Head W Or Wo Contrast  Result Date: 01/18/2017 CLINICAL DATA:  Stroke EXAM: CT ANGIOGRAPHY HEAD TECHNIQUE: Multidetector CT imaging of the head was performed using the standard protocol during bolus administration of intravenous contrast. Multiplanar CT image reconstructions and MIPs were obtained to evaluate the vascular anatomy. CONTRAST:  50 mL Isovue 370 IV COMPARISON:  MRI head 01/18/2017.  CT head 01/17/2017 FINDINGS: CTA HEAD Anterior circulation: Ectatic and tortuous cervical internal carotid artery bilaterally. Mild atherosclerotic disease in the cavernous carotid bilaterally without stenosis. Anterior middle cerebral artery is patent bilaterally without stenosis. Posterior circulation: Distal vertebral arteries patent bilaterally without stenosis. Basilar widely patent. PICA, AICA, superior cerebellar, and posterior cerebral artery is patent bilaterally without stenosis Venous sinuses: Patent Anatomic variants: None Delayed phase: Normal enhancement. Subtle hypodensity left occipital parietal lobe compatible with recent infarct is noted on MRI. Small right occipital infarct as noted on MRI. Negative for intracranial hemorrhage.  IMPRESSION: No significant intracranial stenosis Recent small infarcts in the left occipital parietal lobe and right occipital lobe without hemorrhage. Electronically Signed   By: Franchot Gallo M.D.   On: 01/18/2017 10:19   Mr Brain Wo Contrast  Result Date: 01/18/2017 CLINICAL DATA:  Word-finding difficulties after lunch yesterday. Confusion. History of hypertension, diabetes. EXAM: MRI HEAD WITHOUT CONTRAST TECHNIQUE: Multiplanar, multiecho pulse sequences of the brain and surrounding structures were obtained without intravenous contrast. COMPARISON:  CT HEAD January 17, 2017 and MRI of the head March 22, 2013 FINDINGS: BRAIN: Reduced diffusion LEFT greater than RIGHT parietal cortex. Relatively normalized ADC values on the LEFT with susceptibility artifact. Low ADC values on the RIGHT. A few scattered chronic micro hemorrhages present. Patchy supratentorial white matter FLAIR T2 hyperintensities. Ventricles and sulci are normal for patient's age. No midline shift, mass effect or masses. Prominent basal ganglia perivascular spaces associated with chronic small vessel ischemic disease. No abnormal extra-axial fluid collections. VASCULAR: Normal major intracranial vascular flow voids present at skull base. SKULL AND UPPER CERVICAL SPINE: No abnormal sellar expansion. No suspicious calvarial bone marrow signal. Craniocervical junction maintained. SINUSES/ORBITS: Trace ethmoid mucosal thickening. Small LEFT mastoid effusion. The included ocular globes and orbital contents are non-suspicious. OTHER: None. IMPRESSION: 1. Acute small RIGHT parietal infarct. Subacute LEFT patchy parietal infarct with petechial hemorrhage. Though PCA territory infarcts are possible, findings favor sequelae of PRES. 2. Mild to moderate chronic small vessel ischemic disease. Electronically Signed   By: Elon Alas M.D.   On: 01/18/2017 01:42   Dg Chest Portable 1 View  Result Date: 01/17/2017 CLINICAL DATA:  Elevated  troponin EXAM: PORTABLE CHEST 1 VIEW COMPARISON:  09/14/2016 FINDINGS: Mild cardiomegaly with central vascular congestion. No pleural effusion or focal consolidation. No pneumothorax. IMPRESSION: Cardiomegaly with vascular congestion. Electronically Signed   By: Donavan Foil M.D.   On: 01/17/2017 19:47   Ct Head Code Stroke Wo Contrast  Result Date: 01/17/2017 CLINICAL DATA:  Code stroke. Altered level of consciousness. Could not remember how to get home. EXAM: CT HEAD WITHOUT CONTRAST TECHNIQUE: Contiguous axial images were obtained from the base of the skull through the vertex without intravenous contrast. COMPARISON:  08/08/2015 FINDINGS: Brain: No evidence of acute infarction, hemorrhage, hydrocephalus, extra-axial collection or mass lesion/mass effect. Mild volume loss and chronic  small vessel ischemia. Vascular: Atherosclerotic calcification.  No high-density vessel. Skull: Negative for fracture Sinuses/Orbits: Negative Other: These results were called by telephone at the time of interpretation on 01/17/2017 at 4:28 pm to Dr. Virgel Manifold , who verbally acknowledged these results. ASPECTS [facility] Stroke Program Early CT Score) Not scored with this history IMPRESSION: No acute finding or change from 2017. Electronically Signed   By: Monte Fantasia M.D.   On: 01/17/2017 16:29    Cardiac Studies   2D echo 01/18/2017 Study Conclusions  - Left ventricle: The cavity size was normal. There was moderate   concentric hypertrophy. Systolic function was normal. The   estimated ejection fraction was in the range of 55% to 60%. Wall   motion was normal; there were no regional wall motion   abnormalities. Diastolic function assessment limited in the   setting of mitral annular calcification. - Mitral valve: Moderate annular and leaflet calcification with   mildly restricted movement. Mean PG 5 mmHg at HR 71 bpm MVA by   VTI 1.5 cm2. Unchanged compared to prior echocardiogram on   09/28/2016. No  other significant valvular abnormality. - Left atrium: The atrium was moderately dilated. - No other significant valvular abnormality. - No significant change compared to prior echocardiogram   09/28/2016.   Patient Profile     71 y.o. female with a history of HTN, PAF, OSA on CPAP and morbid obesity, admitted with slurred speech, HA and elevated trop with MRI of the brain showing PRES vs acute infarcts with petechial hemorrhage.  Apparently had a history of PAF and was awaiting going on anticoagulation until after a colonoscopy.   Assessment & Plan    1.  Acute multifocal ischemic CVAs with PRES - infarcts possibly related to PAF - Neuro has on Plavix and ASA  2.  Elevated troponin with no history of CP or SOB and 2D echo with normal LVF.  In setting of normal LVF this is unlikely to represent ACS and likely elevated due to acute CVA. - I will get a coronary CTA with morphology to assess for CAD as cath is contraindicated in acute CVA .  If no CAD then would stop ASA, Heparin and Plavix and favor starting Eliquis when ok with Neuro - continue BB and statin  3.  PAF - maintaining NSR at present - continue BB - she is on IV Heparin but would transition to Eliquis only if no CAD on coronary CTA.   4.  HTN - BP stable on exam  - continue BB and ARB  For questions or updates, please contact [place] Please consult [email or web address] for contact info under Cardiology/STEMI.      Signed, Fransico Him, MD  01/19/2017, 9:41 AM

## 2017-01-19 NOTE — Progress Notes (Signed)
STROKE TEAM PROGRESS NOTE   SUBJECTIVE (INTERVAL HISTORY) Her  son is at the bedside.   Overall she feels her condition is improving. No new events reported overnight. Speech and hand weakness improved but visual deficit persists  OBJECTIVE  Recent Labs Lab 01/18/17 0005  GLUCAP 122*    Recent Labs Lab 01/17/17 1629 01/17/17 1648 01/18/17 0223 01/18/17 0745 01/19/17 0214  NA 141 140  --  136 135  K 3.3* 3.2*  --  3.5 3.2*  CL 103 99*  --  103 104  CO2 28  --   --  24 25  GLUCOSE 129* 125*  --  108* 104*  BUN 16 17  --  9 7  CREATININE 0.63 0.60  --  0.61 0.60  CALCIUM 9.2  --   --  8.5* 8.3*  MG  --   --  1.8  --   --     Recent Labs Lab 01/17/17 1629 01/18/17 0745  AST 34 41  ALT 32 33  ALKPHOS 81 81  BILITOT 0.5 0.8  PROT 7.1 6.4*  ALBUMIN 3.8 3.3*    Recent Labs Lab 01/17/17 1629 01/17/17 1648 01/18/17 0745 01/19/17 0214  WBC 9.7  --  10.3 9.8  NEUTROABS 6.0  --   --  5.6  HGB 14.5 14.6 14.3 13.7  HCT 45.6 43.0 43.1 42.1  MCV 87.7  --  86.2 85.9  PLT 299  --  313 264    Recent Labs Lab 01/17/17 1749 01/18/17 0223 01/18/17 0745 01/18/17 1151  TROPONINI 1.45* 2.15* 1.60* 0.96*    Recent Labs  01/17/17 1629  LABPROT 13.3  INR 1.02    Recent Labs  01/17/17 1723  COLORURINE YELLOW  LABSPEC 1.017  PHURINE 6.0  GLUCOSEU NEGATIVE  HGBUR NEGATIVE  BILIRUBINUR NEGATIVE  KETONESUR NEGATIVE  PROTEINUR NEGATIVE  NITRITE NEGATIVE  LEUKOCYTESUR NEGATIVE       Component Value Date/Time   CHOL 168 01/18/2017 0223   TRIG 158 (H) 01/18/2017 0223   HDL 37 (L) 01/18/2017 0223   CHOLHDL 4.5 01/18/2017 0223   VLDL 32 01/18/2017 0223   LDLCALC 99 01/18/2017 0223   Lab Results  Component Value Date   HGBA1C 6.3 (H) 01/17/2017      Component Value Date/Time   LABOPIA NONE DETECTED 01/17/2017 1723   COCAINSCRNUR NONE DETECTED 01/17/2017 1723   LABBENZ NONE DETECTED 01/17/2017 1723   AMPHETMU NONE DETECTED 01/17/2017 1723   THCU NONE  DETECTED 01/17/2017 1723   LABBARB NONE DETECTED 01/17/2017 1723     Recent Labs Lab 01/17/17 1629  ETH <10    IMAGING: I have personally reviewed the radiological images below and agree with the radiology interpretations.  Ct Angio Head W Or Wo Contrast Result Date: 01/18/2017  IMPRESSION: No significant intracranial stenosis Recent small infarcts in the left occipital parietal lobe and right occipital lobe without hemorrhage.   Mr Brain Wo Contrast Result Date: 01/18/2017 IMPRESSION: 1. Acute small RIGHT parietal infarct. Subacute LEFT patchy parietal infarct with petechial hemorrhage. Though PCA territory infarcts are possible, findings favor sequelae of PRES. 2. Mild to moderate chronic small vessel ischemic disease.   Ct Head Code Stroke Wo Contrast Result Date: 01/17/2017 FINDINGS: Brain: No evidence of acute infarction, hemorrhage, hydrocephalus, extra-axial collection or mass lesion/mass effect. Mild volume loss and chronic small vessel ischemia. Vascular: Atherosclerotic calcification.  No high-density vessel. Skull: Negative for fracture Sinuses/Orbits: Negative Other:    2D Echo Left ventricle: The cavity size was  normal. There was moderate concentric hypertrophy. Systolic function was normal. The estimated ejection fraction was in the range of 55% to 60%. Wall motion was normal; there were no regional wall motion abnormalities. Diastolic function assessment limited in the setting of mitral annular calcification. Mitral valve: Moderate annular and leaflet calcification with mildly restricted movement. Unchanged compared to prior echocardiogram on  09/28/2016. No other significant valvular abnormality. Left atrium: The atrium was moderately dilated. No other significant valvular abnormality. No significant change compared to prior echocardiogram 09/28/2016.   B/L Carotid Doppler:         PENDING   PHYSICAL EXAM Temp:  [98 F (36.7 C)-98.8 F (37.1 C)] 98.8 F (37.1 C)  (10/30 1126) Pulse Rate:  [68-82] 68 (10/30 1126) Resp:  [24-30] 24 (10/30 1126) BP: (147-172)/(72-88) 172/88 (10/30 1126) SpO2:  [92 %-98 %] 96 % (10/30 1126)  General - Well nourished, well developed elderly lady, in no apparent distress Respiratory - No labored breathing noted on exam Cardiovascular - Regular rate and rhythm  Mental Status -  Level of arousal and orientation to time, place, and person were intact. Language - +word finding difficulties/recall, Attention span and concentration was poor, but answered all examiners questions Recent and remote memory appears intact Fund of Knowledge was assessed, appears intact  Cranial Nerves II - XII - II - Right field cut. Easily identifies objects. III, IV, VI - Extraocular movements intact. V - Facial sensation appears intact bilaterally. VII - Facial movement appears intact bilaterally VIII - Hearing & vestibular appears intact bilaterally X - Palate elevates symmetrically XI - Chin turning & shoulder shrug intact bilaterally. XII - Tongue protrusion intact  Motor: Right :  Upper extremity   4/5, difficulty with fine motor                         movements                                     Left:     Upper extremity   4/5             Lower extremity   4/5                                                   Lower extremity   4/5 Tone and bulk:normal tone throughout; no atrophy noted Sensory: light touch intact grossly throughout, bilaterally  ASSESSMENT AND PLAN: Ms. ERIKAH THUMM is a 71 y.o. female with PMH of  HTN, Paroxysmal Afib, OSA on CPAP and morbid obesity admitted for presentation of Migraine-like symptoms slurred speech and H/A per Tele Neurology at [facility]  Transferred to [facility] for elevated and trending troponins with concern for NSTEMI. MRI Brain was obtained which showed acute and subacute infarcts parietal infarcts with petechial hemorrhage. Neurology was consulted for acute stroke  Acute small  Right parietal infarct. Subacute Left patchy parietal infarct with petechial hemorrhage: Likely cardio-embolic secondary to MI and AFIB   Resultant:   +word finding difficulties/recall, Right field cut and Right sided weakness   MRI:  Acute small RIGHT parietal infarct. Subacute LEFT patchy parietal infarct with petechial hemorrhage.   CTA Head:                 small infarcts in the left occipital parietal lobe and right occipital lobe without hemorrhage.    CT Head:    Negative  2D Echo      LVEF 55-60 %. No significant change compared to prior echocardiogram 09/28/2016.    B/L Carotid Doppler:         PENDING   LDL:  99  HgbA1c:  6.3   VTE prophylaxis: SCD's / Lovenox / Heparin /Xarelto  Diet Heart Room service appropriate? Yes; Fluid consistency: Thin    aspirin 81 mg daily prior to admission, now on clopidogrel 75 mg daily and ASA 81 mg daily.    Patient counseled to be compliant with her antithrombotic medications        Therapy recommendations:  PENDING       Disposition: Likely CIR            Recommendations:   Discontinue Heparin when safe to do so per Cardiology   Continue Plavix and ASA 81 mg of now. Will consider starting Eliquis BID in a few days due to bleeding risk, will closely monitor patients continued improvement in exam.  Ongoing aggressive stroke risk factor management        Follow up Appointments:    PCP follow up   Follow up with [facility], Dr Leonie Man in 6 weeks  NSTEMI:  Management per Cardiology  Troponin's trending up  Paroxysmal AFIB:  Will consider starting Eliquis BID in a few days. Will closely monitor patients exam.  Hypertension: Stable, some elevated B/P's noted overnight. Permissive hypertension (OK if <220/120) for 24-48 hours post stroke and then gradually normalized within 5-7 days.  Long term BP goal normotensive. May slowly restart home B/P medications after 48 hours with  close PCP follow up.  Hyperlipidemia:  Home meds:  NONE  LDL     99, goal < 70  Now on  Lipitor to 40 mg daily  Continue statin at discharge  Other Stroke Risk Factors:  Advanced age  Obesity, Body mass index is 49.45 kg/m.   Hx TIA's x 3  Migraines  Obstructive sleep apnea, on CPAP at home   Hospital day # 2   Await cardiology decision on antiplatelets after coronary angio. D/w Dr Radford Pax and son I have personally examined this patient, reviewed notes, independently viewed imaging studies, participated in medical decision making and plan of care.ROS completed by me personally and pertinent positives fully documented  I have made any additions or clarifications directly to the above note. Agree with note above.  She presented with aphasia and right-sided weakness in setting of atrial fibrillation and acute non-ST elevation myocardial infarction. She remains at risk for recurrent stroke and neurological worsening. Agree with short-term anticoagulation with IV heparin but she will eventually need long-term anticoagulation with eliquis after 3-5 days. Cardiology following for acute MI. Discuss with Dr. Verlon Au.This patient is critically ill and at significant risk of neurological worsening, death and care requires constant monitoring of vital signs, hemodynamics,respiratory and cardiac monitoring, extensive review of multiple databases, frequent neurological assessment, discussion with family, other specialists and medical decision making of high complexity.I have made any additions or clarifications directly to the above note.This critical care time does not reflect procedure time, or teaching time or supervisory time of PA/NP/Med Resident etc but could  involve care discussion time.  I spent 30 minutes of neurocritical care time  in the care of  this patient.      Antony Contras, MD Medical Director [facility] Pager: [phone] 01/19/2017 1:53 PM  To contact Stroke  Continuity provider, please refer to [email or web address] After hours, contact General Neurology

## 2017-01-19 NOTE — Progress Notes (Signed)
[place] for Heparin Indication: afib, new CVA  Allergies  Allergen Reactions  . Bactrim [Sulfamethoxazole-Trimethoprim] Other (See Comments)    Unknown   . Ciprofloxacin Other (See Comments)    GI upset  . Hydroxyzine Hcl     Other reaction(s): Other (See Comments) Pt becomes physchotic.  Marland Kitchen Keflet [Cephalexin] Other (See Comments)    GI upset  . Lisinopril Cough  . Spironolactone Nausea Only  . Vistaril [Hydroxyzine Hcl] Other (See Comments)    Pt becomes physchotic.  . Adhesive [Tape] Itching, Rash and Other (See Comments)    Burning  . Erythromycin Rash  . Latex Rash  . Penicillins Rash    Has patient had a PCN reaction causing immediate rash, facial/tongue/throat swelling, SOB or lightheadedness with hypotension: Yes Has patient had a PCN reaction causing severe rash involving mucus membranes or skin necrosis: No Has patient had a PCN reaction that required hospitalization No Has patient had a PCN reaction occurring within the last 10 years: No If all of the above answers are "NO", then may proceed with Cephalosporin use.   . Tetracycline Rash    Patient Measurements: Height: 4' 11.5" (151.1 cm) Weight: 249 lb (112.9 kg) IBW/kg (Calculated) : 44.35 Heparin Dosing Weight:  72.4 kg  Vital Signs: Temp: 98.1 F (36.7 C) (10/29 2329) Temp Source: Oral (10/29 2329) BP: 149/81 (10/29 2329) Pulse Rate: 68 (10/29 1929)  Labs:  Recent Labs  01/17/17 1629 01/17/17 1648  01/18/17 0223 01/18/17 0232 01/18/17 0745 01/18/17 1151 01/18/17 1555 01/18/17 2326  HGB 14.5 14.6  --   --   --  14.3  --   --   --   HCT 45.6 43.0  --   --   --  43.1  --   --   --   PLT 299  --   --   --   --  313  --   --   --   APTT 30  --   --   --   --   --   --   --   --   LABPROT 13.3  --   --   --   --   --   --   --   --   INR 1.02  --   --   --   --   --   --   --   --   HEPARINUNFRC  --   --   --   --  <0.10*  --   --  <0.10* 0.31  CREATININE  0.63 0.60  --   --   --  0.61  --   --   --   TROPONINI  --   --   < > 2.15*  --  1.60* 0.96*  --   --   < > = values in this interval not displayed.  Estimated Creatinine Clearance: 72 mL/min (by C-G formula based on SCr of 0.61 mg/dL).   Assessment: 71 y.o. female with Afib and CVA for heparin  Goal of Therapy:  Heparin level 0.3-0.5 Monitor platelets by anticoagulation protocol: Yes   Plan:  Continue Heparin at current rate Follow-up am labs.   Nicole Cabrera, [place] Curb 01/19/2017,12:18 AM

## 2017-01-19 NOTE — Progress Notes (Signed)
PROGRESS NOTE    Nicole Cabrera  FXT:[id number] DOB: [birth year] DOA: 01/17/2017 PCP: [name], MD   Specialists:     Brief Narrative:  71 year old female With hypertension New diagnosis 09/14/2016 paroxysmal atrial fibrillation, Chads score 6  Epistaxis complicating issue as per progress note 09/21/16 and was hesitant to start anticoagulation OSA on CPAP 11 cm followed by Dr. Annamaria Boots HTN  Sent over from [facility] hospital 01/17/2017 slurred speech and headache MRI brain showed acute subacute infarcts with petechial hemorrhage  Further workup carotid ultrasound echocardiogram Elevated and trending troponins concerning for non-ST segment MI  Cardiology neurology consulted-initially felt to hold heparin would be risky however with rising troponin heparin drip started at low dose under guidance of neurology and cardiology Concern for press syndrome by neurology and CT scan repeated   Assessment & Plan:   Active Problems:   Elevated troponin   Cerebral embolism with cerebral infarction   Intracerebral hemorrhage   Non-ST MI  Further planning as per cardiology  continue ASA 81 as well as Plavix 35  Cont IV heparin gtt-Cardiology ordering CTA of chest re: calcium scoring as Cath contraidicated  Given unclear etiology as a stroke versus press syndrome--neurology has elected to have BP below 299  systolic  Added metoprolol 2.5 q6 hrs SYs BP> 242  Probable Embolic CVAstroke   RPR HIV ammonia B12 folate neg at time of admission   CTA head repeat ordered shows smaller embolic Strokes L and R parietal areas   currently still on IV heparin  ? Elliquis + ASA and plavix on d/c--will defer to neurology and cardiology  Atrial fibrillation chads score at least 5-6, untreated with anticoagulation secondary to epistaxis?  GI bleed risk  Patient currently is rate controlled at this time--re-starting PO metoprolol 25 bid  If heart rate >120 may use as needed metoprolol as  above  KeepK >4, replacing K, checking am [place]  HTN  See above discussion  OSA supposed to be on CPAP 11 cm  Restart the same during hospitalization    DVT prophylaxis: on heparin asa and plavix pending review form Neurology Code Status: full  Family Communication: d/w son Disposition Plan:  Inpatient SDU for now   Consultants:   Neuro   Cardiology  Procedures:   MRI ECHO  CT head  Antimicrobials:   nohne    Subjective:  aake alert less irritable Moves all 4 limbs No cp No le edema Vision fair  Objective: Vitals:   01/18/17 2329 01/19/17 0330 01/19/17 0749 01/19/17 1126  BP: (!) 149/81 (!) 151/76 (!) 161/78 (!) 172/88  Pulse:  71 73 68  Resp: (!) 30 (!) 25 (!) 27 (!) 24  Temp: 98.1 F (36.7 C) 98.6 F (37 C) 98.8 F (37.1 C) 98.8 F (37.1 C)  TempSrc: Oral Oral Oral Oral  SpO2: 94% 92% 93% 96%  Weight:      Height:        Intake/Output Summary (Last 24 hours) at 01/19/17 1243 Last data filed at 01/19/17 0500  Gross per 24 hour  Intake           709.85 ml  Output              250 ml  Net           459.85 ml   Filed Weights   01/17/17 1607  Weight: 112.9 kg (249 lb)    Examination:  Awake alert Some mild R hemianopsia--cannot fully visualize fingers on  R temporal side  Slight weak R hand No drift, no slurred speech/twist of mouth Power 5/5 Reflexes diminished ncat Cta b ,  s1 s 2irreg rate controlled Sensory intact   Data Reviewed: I have personally reviewed following labs and imaging studies  CBC:  Recent Labs Lab 01/17/17 1629 01/17/17 1648 01/18/17 0745 01/19/17 0214  WBC 9.7  --  10.3 9.8  NEUTROABS 6.0  --   --  5.6  HGB 14.5 14.6 14.3 13.7  HCT 45.6 43.0 43.1 42.1  MCV 87.7  --  86.2 85.9  PLT 299  --  313 081   Basic Metabolic Panel:  Recent Labs Lab 01/17/17 1629 01/17/17 1648 01/18/17 0223 01/18/17 0745 01/19/17 0214  NA 141 140  --  136 135  K 3.3* 3.2*  --  3.5 3.2*  CL 103 99*  --  103 104   CO2 28  --   --  24 25  GLUCOSE 129* 125*  --  108* 104*  BUN 16 17  --  9 7  CREATININE 0.63 0.60  --  0.61 0.60  CALCIUM 9.2  --   --  8.5* 8.3*  MG  --   --  1.8  --   --    GFR: Estimated Creatinine Clearance: 72 mL/min (by C-G formula based on SCr of 0.6 mg/dL). Liver Function Tests:  Recent Labs Lab 01/17/17 1629 01/18/17 0745  AST 34 41  ALT 32 33  ALKPHOS 81 81  BILITOT 0.5 0.8  PROT 7.1 6.4*  ALBUMIN 3.8 3.3*   No results for input(s): LIPASE, AMYLASE in the last 168 hours.  Recent Labs Lab 01/18/17 0223  AMMONIA 28   Coagulation Profile:  Recent Labs Lab 01/17/17 1629  INR 1.02   Cardiac Enzymes:  Recent Labs Lab 01/17/17 1749 01/18/17 0223 01/18/17 0745 01/18/17 1151  TROPONINI 1.45* 2.15* 1.60* 0.96*   BNP (last 3 results) No results for input(s): PROBNP in the last 8760 hours. HbA1C:  Recent Labs  01/17/17 2359  HGBA1C 6.3*   CBG:  Recent Labs Lab 01/18/17 0005  GLUCAP 122*   Lipid Profile:  Recent Labs  01/18/17 0223  CHOL 168  HDL 37*  LDLCALC 99  TRIG 158*  CHOLHDL 4.5   Thyroid Function Tests: No results for input(s): TSH, T4TOTAL, FREET4, T3FREE, THYROIDAB in the last 72 hours. Anemia Panel:  Recent Labs  01/18/17 0223  VITAMINB12 599  FOLATE 28.0   Urine analysis:    Component Value Date/Time   COLORURINE YELLOW 01/17/2017 1723   APPEARANCEUR HAZY (A) 01/17/2017 1723   LABSPEC 1.017 01/17/2017 1723   PHURINE 6.0 01/17/2017 1723   GLUCOSEU NEGATIVE 01/17/2017 1723   HGBUR NEGATIVE 01/17/2017 1723   BILIRUBINUR NEGATIVE 01/17/2017 1723   BILIRUBINUR n 11/18/2015 [place] 01/17/2017 1723   PROTEINUR NEGATIVE 01/17/2017 1723   UROBILINOGEN negative 11/18/2015 1325   UROBILINOGEN 0.2 09/02/2014 1847   NITRITE NEGATIVE 01/17/2017 1723   LEUKOCYTESUR NEGATIVE 01/17/2017 1723     Radiology Studies: Reviewed images personally in health database    Scheduled Meds: . iopamidol       . aspirin EC  81 mg Oral Daily  . atorvastatin  40 mg Oral q1800  . clopidogrel  75 mg Oral Daily  . metoprolol tartrate  2.5 mg Intravenous Q6H  . potassium chloride  40 mEq Oral Daily   Continuous Infusions: . heparin 1,250 Units/hr (01/19/17 0818)     LOS: 2 days  Time spent: [place], MD [facility]   If 7PM-7AM, please contact night-coverage [email or web address] Password [email or web address] 01/19/2017, 12:43 PM

## 2017-01-20 DIAGNOSIS — E7800 Pure hypercholesterolemia, unspecified: Secondary | ICD-10-CM

## 2017-01-20 DIAGNOSIS — I10 Essential (primary) hypertension: Secondary | ICD-10-CM

## 2017-01-20 LAB — BASIC METABOLIC PANEL
Anion gap: 10 (ref 5–15)
BUN: 7 mg/dL (ref 6–20)
CO2: 22 mmol/L (ref 22–32)
Calcium: 8.5 mg/dL — ABNORMAL LOW (ref 8.9–10.3)
Chloride: 104 mmol/L (ref 101–111)
Creatinine, Ser: 0.61 mg/dL (ref 0.44–1.00)
GFR calc Af Amer: >60 mL/min (ref >60)
GFR calc non Af Amer: >60 mL/min (ref >60)
Glucose, Bld: 113 mg/dL — ABNORMAL HIGH (ref 65–99)
Potassium: 3.9 mmol/L (ref 3.5–5.1)
Sodium: 136 mmol/L (ref 135–145)

## 2017-01-20 LAB — HEPARIN LEVEL (UNFRACTIONATED): Heparin Unfractionated: 0.34 IU/mL (ref 0.30–0.70)

## 2017-01-20 LAB — CBC
HCT: 44.3 % (ref 36.0–46.0)
Hemoglobin: 14.1 g/dL (ref 12.0–15.0)
MCH: 27.6 pg (ref 26.0–34.0)
MCHC: 31.8 g/dL (ref 30.0–36.0)
MCV: 86.7 fL (ref 78.0–100.0)
Platelets: 258 10*3/uL (ref 150–400)
RBC: 5.11 MIL/uL (ref 3.87–5.11)
RDW: 14.6 % (ref 11.5–15.5)
WBC: 10.5 10*3/uL (ref 4.0–10.5)

## 2017-01-20 LAB — MAGNESIUM: Magnesium: 2.1 mg/dL (ref 1.7–2.4)

## 2017-01-20 MED ORDER — APIXABAN 5 MG PO TABS
5.0000 mg | ORAL_TABLET | Freq: Two times a day (BID) | ORAL | Status: DC
  Administered 2017-01-20 – 2017-01-21 (×3): 5 mg via ORAL
  Filled 2017-01-20 (×3): qty 1

## 2017-01-20 NOTE — Progress Notes (Signed)
Progress Note  Patient Name: Nicole Cabrera Date of Encounter: 01/20/2017  Primary Cardiologist: Dr. Harl Bowie  Subjective   No chest pain or pressure  Inpatient Medications    Scheduled Meds: . aspirin EC  81 mg Oral Daily  . atorvastatin  40 mg Oral q1800  . clopidogrel  75 mg Oral Daily  . metoprolol tartrate  2.5 mg Intravenous Q6H  . potassium chloride  40 mEq Oral Daily   Continuous Infusions: . heparin 1,250 Units/hr (01/19/17 2300)   PRN Meds: acetaminophen, albuterol, ALPRAZolam, metoprolol tartrate   Vital Signs    Vitals:   01/19/17 2000 01/19/17 2300 01/20/17 0300 01/20/17 0700  BP: (!) 141/86 (!) 155/90 135/68   Pulse: 73 61    Resp: 16 (!) 28 20   Temp: 98.1 F (36.7 C) 98.1 F (36.7 C) 98.3 F (36.8 C) 98 F (36.7 C)  TempSrc: Oral Oral Oral Oral  SpO2: 93% 93% 94%   Weight:      Height:        Intake/Output Summary (Last 24 hours) at 01/20/17 0941 Last data filed at 01/19/17 2300  Gross per 24 hour  Intake            461.7 ml  Output              300 ml  Net            161.7 ml   Filed Weights   01/17/17 1607  Weight: 249 lb (112.9 kg)    Telemetry    NSR - Personally Reviewed  ECG    No new EKG to review - Personally Reviewed  Physical Exam   GEN: No acute distress.   Neck: No JVD Cardiac: RRR, no murmurs, rubs, or gallops.  Respiratory: Clear to auscultation bilaterally. GI: Soft, nontender, non-distended  MS: No edema; No deformity. Neuro:  Nonfocal  Psych: Normal affect   Labs    Chemistry Recent Labs Lab 01/17/17 1629  01/18/17 0745 01/19/17 0214 01/20/17 0330  NA 141  < > 136 135 136  K 3.3*  < > 3.5 3.2* 3.9  CL 103  < > 103 104 104  CO2 28  --  24 25 22   GLUCOSE 129*  < > 108* 104* 113*  BUN 16  < > 9 7 7   CREATININE 0.63  < > 0.61 0.60 0.61  CALCIUM 9.2  --  8.5* 8.3* 8.5*  PROT 7.1  --  6.4*  --   --   ALBUMIN 3.8  --  3.3*  --   --   AST 34  --  41  --   --   ALT 32  --  33  --   --   ALKPHOS  81  --  81  --   --   BILITOT 0.5  --  0.8  --   --   GFRNONAA >60  --  >60 >60 >60  GFRAA >60  --  >60 >60 >60  ANIONGAP 10  --  9 6 10   < > = values in this interval not displayed.   Hematology Recent Labs Lab 01/18/17 0745 01/19/17 0214 01/20/17 0330  WBC 10.3 9.8 10.5  RBC 5.00 4.90 5.11  HGB 14.3 13.7 14.1  HCT 43.1 42.1 44.3  MCV 86.2 85.9 86.7  MCH 28.6 28.0 27.6  MCHC 33.2 32.5 31.8  RDW 14.2 14.4 14.6  PLT 313 264 258    Cardiac Enzymes Recent Labs Lab  01/17/17 1749 01/18/17 0223 01/18/17 0745 01/18/17 1151  TROPONINI 1.45* 2.15* 1.60* 0.96*    Recent Labs Lab 01/17/17 1646  TROPIPOC 0.23*     BNPNo results for input(s): BNP, PROBNP in the last 168 hours.   DDimer  Recent Labs Lab 01/18/17 0232  DDIMER 0.37     Radiology    Ct Coronary Morph W/cta Cor W/score W/ca W/cm &/or Wo/cm  Result Date: 01/19/2017 CLINICAL DATA:  87 -year-old female with h/o PAF who was admitted with an acute multifocal ischemic CVAs and elevated troponin. Evaluate for CAD. EXAM: Cardiac/Coronary  CT TECHNIQUE: The patient was scanned on a [employer]. FINDINGS: A 120 kV prospective scan was triggered in the descending thoracic aorta at 111 HU's. Axial non-contrast 3 mm slices were carried out through the heart. The data set was analyzed on a dedicated work station and scored using the [place]. Gantry rotation speed was 250 msecs and collimation was .6 mm. 10 mg of iv Metoprolol and 0.8 mg of sl NTG was given. The 3D data set was reconstructed in 5% intervals of the 67-82 % of the R-R cycle. Diastolic phases were analyzed on a dedicated work station using MPR, MIP and VRT modes. The patient received 80 cc of contrast. Aorta:  Normal size.  No calcifications.  No dissection. Aortic Valve:  Trileaflet.  No calcifications. Coronary Arteries:  Normal coronary origin.  Right dominance. Study quality is affected by patient's size and poor contrast timing. RCA is a large  dominant artery that gives rise to PDA and PLVB. There is mild diffuse non-calcified plaque. Left main is a large artery that gives rise to LAD and LCX arteries. There is no plaque. LAD is a medium caliber vessel that gives rise to one diagonal branch. There is mild calcified plaque in the proximal LAD with associated stenosis 25-50%. Mid LAD has mild noncalcified plaque. Distal LAD has very small lumen. D1 has no obvious plaque. LCX is a non-dominant artery that gives rise to one OM1 branch. There is diffuse mild noncalcified plaque. Other findings: Normal pulmonary vein drainage into the left atrium. Normal left atrial appendage without a thrombus. Mildly dilated pulmonary artery measuring 32 mm suspicious for pulmonary hypertension. Severe mitral annular calcifications. IMPRESSION: 1. Coronary calcium score of 7. This was 80 percentile for age and sex matched control. 2. Normal coronary origin with right dominance. 3. Study quality is affected by patient's size and poor contrast timing. However, there appears to be only mild diffuse non-obstructive CAD. Aggressive risk factor modification is recommended. 4. Large left atrial appendage without a thrombus. 5. Mildly dilated pulmonary artery measuring 32 mm suspicious for pulmonary hypertension. 6. Severe mitral annular calcifications. Ena Dawley Electronically Signed   By: Ena Dawley   On: 01/19/2017 18:07    Cardiac Studies   Coronary CTA 01/19/2017 IMPRESSION: 1. Coronary calcium score of 7. This was 69 percentile for age and sex matched control.  2. Normal coronary origin with right dominance.  3. Study quality is affected by patient's size and poor contrast timing. However, there appears to be only mild diffuse non-obstructive CAD. Aggressive risk factor modification is recommended.  4. Large left atrial appendage without a thrombus.  5. Mildly dilated pulmonary artery measuring 32 mm suspicious for pulmonary  hypertension.  6. Severe mitral annular calcifications.  Patient Profile     71 y.o. female with a history of HTN, PAF, OSA on CPAP and morbid obesity, admitted with slurred speech, HA and elevated  trop with MRI of the brain showing PRES vs acute infarcts with petechial hemorrhage.  Apparently had a history of PAF and was awaiting going on anticoagulation until after a colonoscopy.   Assessment & Plan    1.  Acute multifocal ischemic CVAs with PRES - infarcts possibly related to PAF - Coronary CTA with large LAA with no thrombus - Neuro has on Plavix and ASA currently but would stop these in light of petechial hemorrhages in MRI and place on DOAC only for PAF.  2.  Elevated troponin with no history of CP or SOB and 2D echo with normal LVF.  In setting of normal LVF this is unlikely to represent ACS and likely elevated due to acute CVA. - Peak trop 2.15 and trending downward - coronary CTA with low calcium score of 7 and only mild non obstructive CAD - needs aggressive risk factor modification - continue BB and statin (LDL goal < 70 and LDL this admission 99)  3.  PAF - maintaining NSR at present - continue BB - she is on IV Heparin but would transition to Eliquis per Neuro  4.  HTN - BP stable on exam  - continue BB and ARB  5.  Hyperlipidemia with LDL 99 (goal < 70) - started on statin - will need FLP and ALT in 6 weeks outpt  No other recs at this time. Will sign off.  Call with any questions.  For questions or updates, please contact [place] Please consult [email or web address] for contact info under Cardiology/STEMI.      Signed, Fransico Him, MD  01/20/2017, 9:41 AM

## 2017-01-20 NOTE — Progress Notes (Signed)
PROGRESS NOTE  Nicole Cabrera DUK:[id number] DOB: [birth year] DOA: 01/17/2017 PCP: [name], MD  HPI/Recap of past 37 hours: 71 year old female with past medical history of hypertension, paroxysmal A. fib, obstructive sleep apnea, morbid obesity, presented with slurred speech, headaches.  MRI brain showed acute infarcts with petechial hemorrhage versus PRES.  Patient was also found to have elevated troponin.  Patient admitted for further management  Today, patient reported to be improving, denied any chest pain, shortness of breath, headaches, abdominal pain, nausea/vomiting, fever/chills, dizziness.  Speech has significantly improved.  Assessment/Plan: Active Problems:   Elevated troponin   Cerebral embolism with cerebral infarction   Intracerebral hemorrhage   #Acute CVA due to right parietal infarct, subacute left parietal infarct with PRES Improving Likely cardioembolic secondary to A. fib, was not on anticoagulation due to recent colonoscopy MRI showed acute small right parietal infarct, subacute left patchy parietal infarct with petechial hemorrhage CTA head showed small infarct in the left occipital parietal lobe and right occipital lobe without hemorrhage CT head negative Echo shows LVEF 55-60% Bilateral carotid Doppler pending Lipid panel with LDL 99, started statins Neurology on board: Start Eliquis, discontinue clopidogrel, aspirin and heparin Monitor very closely for bleeding risk  #Elevated troponin Chest pain free, denies shortness of breath Troponin downward trending Echo with normal LVEF as above Cardiology on board: Unlikely ACS, more likely due to acute CVA Coronary CTA with large LAA with no thrombus, low calcium score of 7, only mild nonobstructive CAD Continue Lopressor and statins  #Paroxysmal A. Fib Chads score 5 Normal sinus rhythm, rate controlled Continue Lopressor, started Eliquis Keep potassium greater than 4, magnesium greater than  2  #Hypertension Stable Permissive hypertension okay, gradually normalize On Lopressor IV prn for now Home meds: Losartan-HCT, PO lopressor  #Hyperlipidemia LDL 99 Continue statin  # Pre-diabetes A1c 6.3 Lifestyle modification PCP to follow outpatient  #OSA On CPAP   Code Status: Full  Family Communication: Son at bedside  Disposition Plan: Home once stable   Consultants:  Neurology  Cardiology  Procedures:  None  Antimicrobials:  None  DVT prophylaxis:  Eliquis   Objective: Vitals:   01/20/17 0800 01/20/17 0900 01/20/17 1000 01/20/17 1130  BP: 123/73 (!) 144/90 (!) 144/84   Pulse:      Resp: (!) 24 19 (!) 30   Temp:    98.1 F (36.7 C)  TempSrc:    Oral  SpO2:    98%  Weight:      Height:        Intake/Output Summary (Last 24 hours) at 01/20/17 1249 Last data filed at 01/20/17 1200  Gross per 24 hour  Intake            864.2 ml  Output              300 ml  Net            564.2 ml   Filed Weights   01/17/17 1607  Weight: 112.9 kg (249 lb)    Exam:   General: Awake alert oriented x3  Cardiovascular: Heart sounds S1-S2 present no added sound  Respiratory: Chest clear bilaterally  Abdomen: Obese, soft, nontender, nondistended  Neuro: Upper and lower extremity 4/5, sensation intact, no focal neurologic deficit noted  Skin: Normal  Psychiatry: Normal mood   Data Reviewed: CBC:  Recent Labs Lab 01/17/17 1629 01/17/17 1648 01/18/17 0745 01/19/17 0214 01/20/17 0330  WBC 9.7  --  10.3 9.8 10.5  NEUTROABS 6.0  --   --  5.6  --   HGB 14.5 14.6 14.3 13.7 14.1  HCT 45.6 43.0 43.1 42.1 44.3  MCV 87.7  --  86.2 85.9 86.7  PLT 299  --  313 264 637   Basic Metabolic Panel:  Recent Labs Lab 01/17/17 1629 01/17/17 1648 01/18/17 0223 01/18/17 0745 01/19/17 0214 01/20/17 0330  NA 141 140  --  136 135 136  K 3.3* 3.2*  --  3.5 3.2* 3.9  CL 103 99*  --  103 104 104  CO2 28  --   --  24 25 22   GLUCOSE 129* 125*  --  108*  104* 113*  BUN 16 17  --  9 7 7   CREATININE 0.63 0.60  --  0.61 0.60 0.61  CALCIUM 9.2  --   --  8.5* 8.3* 8.5*  MG  --   --  1.8  --   --  2.1   GFR: Estimated Creatinine Clearance: 72 mL/min (by C-G formula based on SCr of 0.61 mg/dL). Liver Function Tests:  Recent Labs Lab 01/17/17 1629 01/18/17 0745  AST 34 41  ALT 32 33  ALKPHOS 81 81  BILITOT 0.5 0.8  PROT 7.1 6.4*  ALBUMIN 3.8 3.3*   No results for input(s): LIPASE, AMYLASE in the last 168 hours.  Recent Labs Lab 01/18/17 0223  AMMONIA 28   Coagulation Profile:  Recent Labs Lab 01/17/17 1629  INR 1.02   Cardiac Enzymes:  Recent Labs Lab 01/17/17 1749 01/18/17 0223 01/18/17 0745 01/18/17 1151  TROPONINI 1.45* 2.15* 1.60* 0.96*   BNP (last 3 results) No results for input(s): PROBNP in the last 8760 hours. HbA1C:  Recent Labs  01/17/17 2359  HGBA1C 6.3*   CBG:  Recent Labs Lab 01/18/17 0005  GLUCAP 122*   Lipid Profile:  Recent Labs  01/18/17 0223  CHOL 168  HDL 37*  LDLCALC 99  TRIG 158*  CHOLHDL 4.5   Thyroid Function Tests: No results for input(s): TSH, T4TOTAL, FREET4, T3FREE, THYROIDAB in the last 72 hours. Anemia Panel:  Recent Labs  01/18/17 0223  VITAMINB12 599  FOLATE 28.0   Urine analysis:    Component Value Date/Time   COLORURINE YELLOW 01/17/2017 1723   APPEARANCEUR HAZY (A) 01/17/2017 1723   LABSPEC 1.017 01/17/2017 1723   PHURINE 6.0 01/17/2017 1723   GLUCOSEU NEGATIVE 01/17/2017 1723   HGBUR NEGATIVE 01/17/2017 1723   BILIRUBINUR NEGATIVE 01/17/2017 1723   BILIRUBINUR n 11/18/2015 1325   KETONESUR NEGATIVE 01/17/2017 1723   PROTEINUR NEGATIVE 01/17/2017 1723   UROBILINOGEN negative 11/18/2015 1325   UROBILINOGEN 0.2 09/02/2014 1847   NITRITE NEGATIVE 01/17/2017 1723   LEUKOCYTESUR NEGATIVE 01/17/2017 1723   Sepsis Labs: @LABRCNTIP (procalcitonin:4,lacticidven:4)  ) Recent Results (from the past 240 hour(s))  MRSA PCR Screening     Status: None     Collection Time: 01/17/17 11:22 PM  Result Value Ref Range Status   MRSA by PCR NEGATIVE NEGATIVE Final    Comment:        The GeneXpert MRSA Assay (FDA approved for NASAL specimens only), is one component of a comprehensive MRSA colonization surveillance program. It is not intended to diagnose MRSA infection nor to guide or monitor treatment for MRSA infections.       Studies: Ct Coronary Morph W/cta Cor W/score W/ca W/cm &/or Wo/cm  Addendum Date: 01/20/2017   ADDENDUM REPORT: 01/20/2017 11:30 EXAM: OVER-READ INTERPRETATION  CT CHEST The following report is an over-read performed by radiologist Dr. Collene Leyden [facility] on 01/20/2017. This over-read does not include interpretation of cardiac or coronary anatomy or pathology. The coronary CTA Interpretation by the cardiologist is attached. COMPARISON:  None. FINDINGS: Heart is mildly enlarged.  Aorta is normal caliber. No adenopathy in the lower mediastinum or hila. Linear atelectasis or scarring in the lung bases. No effusions. 8 mm nodule in the right middle lobe on image 18. 7 mm nodule in the right middle lobe on image 27. 5 mm nodule in the right lower lobe on image 24. Imaging into the upper abdomen shows no acute findings. Chest wall soft tissues are unremarkable. No acute bony abnormality. IMPRESSION: Several small nodules in the right middle lobe and right lower lobe, the largest 8 mm. Non-contrast chest CT at 3-6 months is recommended. If the nodules are stable at time of repeat CT, then future CT at 18-24 months (from today's scan) is considered optional for low-risk patients, but is recommended for high-risk patients. This recommendation follows the consensus statement: Guidelines for Management of Incidental Pulmonary Nodules Detected on CT Images: From the Fleischner Society 2017; Radiology 2017; 284:228-243. Electronically Signed   By: Rolm Baptise M.D.   On: 01/20/2017 11:30   Result Date:  01/20/2017 CLINICAL DATA:  20 -year-old female with h/o PAF who was admitted with an acute multifocal ischemic CVAs and elevated troponin. Evaluate for CAD. EXAM: Cardiac/Coronary  CT TECHNIQUE: The patient was scanned on a [employer]. FINDINGS: A 120 kV prospective scan was triggered in the descending thoracic aorta at 111 HU's. Axial non-contrast 3 mm slices were carried out through the heart. The data set was analyzed on a dedicated work station and scored using the [place]. Gantry rotation speed was 250 msecs and collimation was .6 mm. 10 mg of iv Metoprolol and 0.8 mg of sl NTG was given. The 3D data set was reconstructed in 5% intervals of the 67-82 % of the R-R cycle. Diastolic phases were analyzed on a dedicated work station using MPR, MIP and VRT modes. The patient received 80 cc of contrast. Aorta:  Normal size.  No calcifications.  No dissection. Aortic Valve:  Trileaflet.  No calcifications. Coronary Arteries:  Normal coronary origin.  Right dominance. Study quality is affected by patient's size and poor contrast timing. RCA is a large dominant artery that gives rise to PDA and PLVB. There is mild diffuse non-calcified plaque. Left main is a large artery that gives rise to LAD and LCX arteries. There is no plaque. LAD is a medium caliber vessel that gives rise to one diagonal branch. There is mild calcified plaque in the proximal LAD with associated stenosis 25-50%. Mid LAD has mild noncalcified plaque. Distal LAD has very small lumen. D1 has no obvious plaque. LCX is a non-dominant artery that gives rise to one OM1 branch. There is diffuse mild noncalcified plaque. Other findings: Normal pulmonary vein drainage into the left atrium. Normal left atrial appendage without a thrombus. Mildly dilated pulmonary artery measuring 32 mm suspicious for pulmonary hypertension. Severe mitral annular calcifications. IMPRESSION: 1. Coronary calcium score of 7. This was 79 percentile for age and  sex matched control. 2. Normal coronary origin with right dominance. 3. Study quality is affected by patient's size and poor contrast timing. However, there appears to be only mild diffuse non-obstructive CAD. Aggressive risk factor modification is recommended. 4. Large left atrial appendage without a thrombus. 5. Mildly dilated pulmonary artery measuring 32 mm suspicious for pulmonary hypertension. 6. Severe mitral annular calcifications. Ena Dawley  Electronically Signed: By: Ena Dawley On: 01/19/2017 18:07    Scheduled Meds: . apixaban  5 mg Oral BID  . atorvastatin  40 mg Oral q1800  . metoprolol tartrate  2.5 mg Intravenous Q6H  . potassium chloride  40 mEq Oral Daily    Continuous Infusions:   LOS: 3 days     Alma Friendly, MD [facility] Pager [phone]-  If 7PM-7AM, please contact night-coverage [email or web address] Password [email or web address] 01/20/2017, 12:49 PM

## 2017-01-20 NOTE — Progress Notes (Signed)
STROKE TEAM PROGRESS NOTE   SUBJECTIVE (INTERVAL HISTORY) Her  friend is at the bedside.   Overall she feels her condition is improving. No new events reported overnight. Reviewed cardiology recommendations  OBJECTIVE  Recent Labs Lab 01/18/17 0005  GLUCAP 122*    Recent Labs Lab 01/17/17 1629 01/17/17 1648 01/18/17 0223 01/18/17 0745 01/19/17 0214 01/20/17 0330  NA 141 140  --  136 135 136  K 3.3* 3.2*  --  3.5 3.2* 3.9  CL 103 99*  --  103 104 104  CO2 28  --   --  24 25 22   GLUCOSE 129* 125*  --  108* 104* 113*  BUN 16 17  --  9 7 7   CREATININE 0.63 0.60  --  0.61 0.60 0.61  CALCIUM 9.2  --   --  8.5* 8.3* 8.5*  MG  --   --  1.8  --   --  2.1    Recent Labs Lab 01/17/17 1629 01/18/17 0745  AST 34 41  ALT 32 33  ALKPHOS 81 81  BILITOT 0.5 0.8  PROT 7.1 6.4*  ALBUMIN 3.8 3.3*    Recent Labs Lab 01/17/17 1629 01/17/17 1648 01/18/17 0745 01/19/17 0214 01/20/17 0330  WBC 9.7  --  10.3 9.8 10.5  NEUTROABS 6.0  --   --  5.6  --   HGB 14.5 14.6 14.3 13.7 14.1  HCT 45.6 43.0 43.1 42.1 44.3  MCV 87.7  --  86.2 85.9 86.7  PLT 299  --  313 264 258    Recent Labs Lab 01/17/17 1749 01/18/17 0223 01/18/17 0745 01/18/17 1151  TROPONINI 1.45* 2.15* 1.60* 0.96*    Recent Labs  01/17/17 1629  LABPROT 13.3  INR 1.02    Recent Labs  01/17/17 1723  COLORURINE YELLOW  LABSPEC 1.017  PHURINE 6.0  GLUCOSEU NEGATIVE  HGBUR NEGATIVE  BILIRUBINUR NEGATIVE  KETONESUR NEGATIVE  PROTEINUR NEGATIVE  NITRITE NEGATIVE  LEUKOCYTESUR NEGATIVE       Component Value Date/Time   CHOL 168 01/18/2017 0223   TRIG 158 (H) 01/18/2017 0223   HDL 37 (L) 01/18/2017 0223   CHOLHDL 4.5 01/18/2017 0223   VLDL 32 01/18/2017 0223   LDLCALC 99 01/18/2017 0223   Lab Results  Component Value Date   HGBA1C 6.3 (H) 01/17/2017      Component Value Date/Time   LABOPIA NONE DETECTED 01/17/2017 1723   COCAINSCRNUR NONE DETECTED 01/17/2017 1723   LABBENZ NONE DETECTED  01/17/2017 1723   AMPHETMU NONE DETECTED 01/17/2017 1723   THCU NONE DETECTED 01/17/2017 1723   LABBARB NONE DETECTED 01/17/2017 1723     Recent Labs Lab 01/17/17 1629  ETH <10    IMAGING: I have personally reviewed the radiological images below and agree with the radiology interpretations.  Ct Angio Head W Or Wo Contrast Result Date: 01/18/2017  IMPRESSION: No significant intracranial stenosis Recent small infarcts in the left occipital parietal lobe and right occipital lobe without hemorrhage.   Mr Brain Wo Contrast Result Date: 01/18/2017 IMPRESSION: 1. Acute small RIGHT parietal infarct. Subacute LEFT patchy parietal infarct with petechial hemorrhage. Though PCA territory infarcts are possible, findings favor sequelae of PRES. 2. Mild to moderate chronic small vessel ischemic disease.   Ct Head Code Stroke Wo Contrast Result Date: 01/17/2017 FINDINGS: Brain: No evidence of acute infarction, hemorrhage, hydrocephalus, extra-axial collection or mass lesion/mass effect. Mild volume loss and chronic small vessel ischemia. Vascular: Atherosclerotic calcification.  No high-density vessel. Skull: Negative for  fracture Sinuses/Orbits: Negative Other:    2D Echo Left ventricle: The cavity size was normal. There was moderate concentric hypertrophy. Systolic function was normal. The estimated ejection fraction was in the range of 55% to 60%. Wall motion was normal; there were no regional wall motion abnormalities. Diastolic function assessment limited in the setting of mitral annular calcification. Mitral valve: Moderate annular and leaflet calcification with mildly restricted movement. Unchanged compared to prior echocardiogram on  09/28/2016. No other significant valvular abnormality. Left atrium: The atrium was moderately dilated. No other significant valvular abnormality. No significant change compared to prior echocardiogram 09/28/2016.   B/L Carotid Doppler:          PENDING   PHYSICAL EXAM Temp:  [98 F (36.7 C)-99.3 F (37.4 C)] 98.1 F (36.7 C) (10/31 1130) Pulse Rate:  [61-75] 61 (10/30 2300) Resp:  [16-30] 30 (10/31 1000) BP: (120-160)/(68-92) 144/84 (10/31 1000) SpO2:  [93 %-98 %] 98 % (10/31 1130)  General - Well nourished, well developed elderly lady, in no apparent distress Respiratory - No labored breathing noted on exam Cardiovascular - Regular rate and rhythm  Mental Status -  Level of arousal and orientation to time, place, and person were intact. Language - +word finding difficulties/recall, Attention span and concentration was poor, but answered all examiners questions Recent and remote memory appears intact Fund of Knowledge was assessed, appears intact  Cranial Nerves II - XII - II - Right field partial cut. Easily identifies objects. III, IV, VI - Extraocular movements intact. V - Facial sensation appears intact bilaterally. VII - Facial movement appears intact bilaterally VIII - Hearing & vestibular appears intact bilaterally X - Palate elevates symmetrically XI - Chin turning & shoulder shrug intact bilaterally. XII - Tongue protrusion intact  Motor: Right :  Upper extremity   4/5, difficulty with fine motor                         movements                                     Left:     Upper extremity   4/5             Lower extremity   4/5                                                   Lower extremity   4/5 Tone and bulk:normal tone throughout; no atrophy noted Sensory: light touch intact grossly throughout, bilaterally  ASSESSMENT AND PLAN: Nicole Cabrera is a 71 y.o. female with PMH of  HTN, Paroxysmal Afib, OSA on CPAP and morbid obesity admitted for presentation of Migraine-like symptoms slurred speech and H/A per Tele Neurology at [facility].  Transferred to [facility] for elevated and trending troponins with concern for NSTEMI. MRI Brain was obtained which showed acute and subacute infarcts parietal  infarcts with petechial hemorrhage. Neurology was consulted for acute stroke  Acute small Right parietal infarct. Subacute Left patchy parietal infarct with petechial hemorrhage: Likely cardio-embolic secondary to MI and AFIB   Resultant:   +word finding difficulties/recall, Right field cut and Right sided weakness   MRI:  Acute small RIGHT parietal infarct. Subacute LEFT patchy parietal infarct with petechial hemorrhage.   CTA Head:                 small infarcts in the left occipital parietal lobe and right occipital lobe without hemorrhage.    CT Head:    Negative  2D Echo      LVEF 55-60 %. No significant change compared to prior echocardiogram 09/28/2016.    B/L Carotid Doppler:         PENDING   LDL:  99  HgbA1c:  6.3   VTE prophylaxis: SCD's / Lovenox / Heparin /Xarelto  Diet Heart Room service appropriate? Yes; Fluid consistency: Thin    aspirin 81 mg daily prior to admission, now on clopidogrel 75 mg daily and ASA 81 mg daily.    Patient counseled to be compliant with her antithrombotic medications        Therapy recommendations:  PENDING       Disposition: Likely CIR            Recommendations:   Discontinue Heparin   And Plavix and ASA 81 mg of now. Start Eliquis BID in a few days due to bleeding risk, will closely monitor patients continued improvement in exam.  Ongoing aggressive stroke risk factor management        Follow up Appointments:    PCP follow up   Follow up with [facility], Dr Leonie Man in 6 weeks  NSTEMI:  Management per Cardiology  Troponin's trending up  Paroxysmal AFIB:  Will consider starting Eliquis BID in a few days. Will closely monitor patients exam.  Hypertension: Stable, some elevated B/P's noted overnight. Permissive hypertension (OK if <220/120) for 24-48 hours post stroke and then gradually normalized within 5-7 days.  Long term BP goal normotensive. May slowly restart home  B/P medications after 48 hours with close PCP follow up.  Hyperlipidemia:  Home meds:  NONE  LDL     99, goal < 70  Now on  Lipitor to 40 mg daily  Continue statin at discharge  Other Stroke Risk Factors:  Advanced age  Obesity, Body mass index is 49.45 kg/m.   Hx TIA's x 3  Migraines  Obstructive sleep apnea, on CPAP at home   Hospital day # 3  Stop heparin, aspirin and Plavix and instead do eliquis alone. Patient advised not to drive till peripheral vision loss improves. Stroke team will sign off. Kindly call for questions. Follow-up in the stroke clinic in 6 weeks. Discussed with Dr. Marisa Hua, MD Medical Director [place] Pager: [phone] 01/20/2017 2:04 PM  To contact Stroke Continuity provider, please refer to [email or web address] After hours, contact General Neurology

## 2017-01-20 NOTE — Progress Notes (Signed)
ANTICOAGULATION CONSULT NOTE - Follow Up Consult  Pharmacy Consult for Heparin Indication: afib, new CVA  Allergies  Allergen Reactions  . Bactrim [Sulfamethoxazole-Trimethoprim] Other (See Comments)    Unknown   . Ciprofloxacin Other (See Comments)    GI upset  . Hydroxyzine Hcl     Other reaction(s): Other (See Comments) Pt becomes physchotic.  Marland Kitchen Keflet [Cephalexin] Other (See Comments)    GI upset  . Lisinopril Cough  . Spironolactone Nausea Only  . Vistaril [Hydroxyzine Hcl] Other (See Comments)    Pt becomes physchotic.  . Adhesive [Tape] Itching, Rash and Other (See Comments)    Burning  . Erythromycin Rash  . Latex Rash  . Penicillins Rash    Has patient had a PCN reaction causing immediate rash, facial/tongue/throat swelling, SOB or lightheadedness with hypotension: Yes Has patient had a PCN reaction causing severe rash involving mucus membranes or skin necrosis: No Has patient had a PCN reaction that required hospitalization No Has patient had a PCN reaction occurring within the last 10 years: No If all of the above answers are "NO", then may proceed with Cephalosporin use.   . Tetracycline Rash    Patient Measurements: Height: 4' 11.5" (151.1 cm) Weight: 249 lb (112.9 kg) IBW/kg (Calculated) : 44.35 Heparin Dosing Weight:  72.4 kg  Vital Signs: Temp: 98 F (36.7 C) (10/31 0700) Temp Source: Oral (10/31 0700) BP: 135/68 (10/31 0300) Pulse Rate: 61 (10/30 2300)  Labs:  Recent Labs  01/17/17 1629  01/18/17 0223  01/18/17 0745 01/18/17 1151  01/18/17 2326 01/19/17 0214 01/20/17 0330  HGB 14.5  < >  --   --  14.3  --   --   --  13.7 14.1  HCT 45.6  < >  --   --  43.1  --   --   --  42.1 44.3  PLT 299  --   --   --  313  --   --   --  264 258  APTT 30  --   --   --   --   --   --   --   --   --   LABPROT 13.3  --   --   --   --   --   --   --   --   --   INR 1.02  --   --   --   --   --   --   --   --   --   HEPARINUNFRC  --   --   --   < >  --   --    < > 0.31 0.28* 0.34  CREATININE 0.63  < >  --   --  0.61  --   --   --  0.60 0.61  TROPONINI  --   < > 2.15*  --  1.60* 0.96*  --   --   --   --   < > = values in this interval not displayed.  Estimated Creatinine Clearance: 72 mL/min (by C-G formula based on SCr of 0.61 mg/dL).   Assessment:  Anticoag: Afib - on heparin, Chadsvasc 6.  Higher risk for bleeding given suspected blood in stool and epistaxis?  However, due to rising troponins and potential that current CVA caused by afib, MD decided to start heparin, but target lower HL 0.3-0.5 Heparin 1250 uts/hr HL 0.34 at goal, CBC ok.    Goal of Therapy:  Heparin level  0.3-0.5 Monitor platelets by anticoagulation protocol: Yes   Plan:  Continue  IV heparin 1250 units/hr See neurology recommendations > follow up transition to apixabn 5mg  BID Consider dropping ASA and continuing clopidogrel to decrease risk of bleeding with triple therapy.       Bonnita Nasuti Pharm.D. CPP, BCPS Clinical Pharmacist [phone] 01/20/2017 9:32 AM

## 2017-01-21 LAB — BASIC METABOLIC PANEL
Anion gap: 7 (ref 5–15)
BUN: 13 mg/dL (ref 6–20)
CO2: 24 mmol/L (ref 22–32)
Calcium: 8.8 mg/dL — ABNORMAL LOW (ref 8.9–10.3)
Chloride: 106 mmol/L (ref 101–111)
Creatinine, Ser: 0.64 mg/dL (ref 0.44–1.00)
GFR calc Af Amer: >60 mL/min (ref >60)
GFR calc non Af Amer: >60 mL/min (ref >60)
Glucose, Bld: 102 mg/dL — ABNORMAL HIGH (ref 65–99)
Potassium: 4 mmol/L (ref 3.5–5.1)
Sodium: 137 mmol/L (ref 135–145)

## 2017-01-21 LAB — CBC
HCT: 44.4 % (ref 36.0–46.0)
Hemoglobin: 14.3 g/dL (ref 12.0–15.0)
MCH: 28 pg (ref 26.0–34.0)
MCHC: 32.2 g/dL (ref 30.0–36.0)
MCV: 87.1 fL (ref 78.0–100.0)
Platelets: 243 10*3/uL (ref 150–400)
RBC: 5.1 MIL/uL (ref 3.87–5.11)
RDW: 14.6 % (ref 11.5–15.5)
WBC: 10.5 10*3/uL (ref 4.0–10.5)

## 2017-01-21 MED ORDER — ATORVASTATIN CALCIUM 40 MG PO TABS: 40.0000 mg | ORAL_TABLET | Freq: Every day | ORAL | 0 refills | Status: DC

## 2017-01-21 MED ORDER — APIXABAN 5 MG PO TABS: 5.0000 mg | ORAL_TABLET | Freq: Two times a day (BID) | ORAL | 0 refills | Status: DC

## 2017-01-21 MED ORDER — POTASSIUM CHLORIDE CRYS ER 20 MEQ PO TBCR: 20.0000 meq | EXTENDED_RELEASE_TABLET | Freq: Every day | ORAL | 0 refills | Status: DC

## 2017-01-21 NOTE — Evaluation (Addendum)
Physical Therapy Evaluation Patient Details Name: Nicole Cabrera MRN: [id number] DOB: May 06, 1945 Today's Date: 01/21/2017   History of Present Illness  71 year old female with past medical history of hypertension, paroxysmal A. fib, obstructive sleep apnea, morbid obesity, presented with slurred speech, headaches.  MRI brain showed acute infarcts with petechial hemorrhage versus PRES.  Patient was also found to have elevated troponin.  Patient admitted for further management  MRI showed Acute small RIGHT parietal infarct. Subacute LEFT patchy parietal infarct with petechial hemorrhage. Though PCA territory infarcts are possible, findings favor sequelae of PRES. 2. Mild to moderate chronic small vessel ischemic disease. CT showed Recent small infarcts in the left occipital parietal lobe and right occipital lobe without hemorrhage.   Clinical Impression  Pt admitted with above diagnosis. Pt currently with functional limitations due to the deficits listed below (see PT Problem List). Pt was able to ambulate with cane and is close to baseline.  Just feels a little weak.  Will benefit from Outpt PT to progress to prior level of function.  Pt will benefit from skilled PT to increase their independence and safety with mobility to allow discharge to the venue listed below.      Follow Up Recommendations Outpatient PT    Equipment Recommendations  None recommended by PT    Recommendations for Other Services       Precautions / Restrictions Precautions Precautions: Fall Restrictions Weight Bearing Restrictions: No      Mobility  Bed Mobility Overal bed mobility: Independent                Transfers Overall transfer level: Independent                  Ambulation/Gait Ambulation/Gait assistance: Min guard;Supervision Ambulation Distance (Feet): 200 Feet Assistive device: Straight cane Gait Pattern/deviations: Step-through pattern;Decreased stride length   Gait velocity  interpretation: Below normal speed for age/gender General Gait Details: Pt did well with ambulation.  States she does not feel at baseline but no LOB with activity.  Did provide mild perturbations. Pt went into bathroom and needed supervision.  Cleaned herself.   Stairs            Wheelchair Mobility    Modified Rankin (Stroke Patients Only)       Balance Overall balance assessment: Needs assistance Sitting-balance support: No upper extremity supported;Feet supported Sitting balance-Leahy Scale: Good     Standing balance support: Single extremity supported;During functional activity Standing balance-Leahy Scale: Fair Standing balance comment: Pt can stand statically without UE support.  Stood at sink and washed hands.                   Standardized Balance Assessment Standardized Balance Assessment : Dynamic Gait Index   Dynamic Gait Index Level Surface: Normal Change in Gait Speed: Normal Gait with Horizontal Head Turns: Normal Gait with Vertical Head Turns: Normal Gait and Pivot Turn: Mild Impairment Step Over Obstacle: Mild Impairment Step Around Obstacles: Normal Steps: Mild Impairment Total Score: 21       Pertinent Vitals/Pain Pain Assessment: No/denies pain  VSS  Home Living Family/patient expects to be discharged to:: Private residence Living Arrangements: Spouse/significant other Available Help at Discharge: Family;Available 24 hours/day (husband or son can be present ) Type of Home: House Home Access: Ramped entrance     Home Layout: Two level;Laundry or work area in [place]: Marine scientist - single point      Prior Function Level of Independence: Independent  Comments: Pt uses cane.  She drives, does Doctor, hospital, as well as medication management      Hand Dominance   Dominant Hand: Right    Extremity/Trunk Assessment   Upper Extremity Assessment Upper Extremity Assessment: RUE  deficits/detail RUE Deficits / Details: (P) grossly 4/5 - 4+/5    Lower Extremity Assessment Lower Extremity Assessment: (P) Defer to PT evaluation    Cervical / Trunk Assessment Cervical / Trunk Assessment: (P) Normal  Communication   Communication: No difficulties  Cognition Arousal/Alertness: Awake/alert Behavior During Therapy: WFL for tasks assessed/performed Overall Cognitive Status: Impaired/Different from baseline Area of Impairment: Attention;Problem solving                   Current Attention Level: Alternating;Divided         Problem Solving: Difficulty sequencing;Requires verbal cues General Comments: Pt with difficulty with serial addition and subtraction by 2's while ambulating       General Comments      Exercises     Assessment/Plan    PT Assessment Patient needs continued PT services  PT Problem List Decreased activity tolerance;Decreased mobility;Decreased balance;Decreased knowledge of use of DME;Decreased safety awareness;Decreased knowledge of precautions       PT Treatment Interventions DME instruction;Gait training;Functional mobility training;Therapeutic exercise;Therapeutic activities;Stair training;Patient/family education;Balance training    PT Goals (Current goals can be found in the Care Plan section)  Acute Rehab PT Goals Patient Stated Goal: to go home PT Goal Formulation: With patient Time For Goal Achievement: 01/28/17 Potential to Achieve Goals: Good    Frequency Min 3X/week   Barriers to discharge        Co-evaluation               AM-PAC PT "6 Clicks" Daily Activity  Outcome Measure Difficulty turning over in bed (including adjusting bedclothes, sheets and blankets)?: None Difficulty moving from lying on back to sitting on the side of the bed? : None Difficulty sitting down on and standing up from a chair with arms (e.g., wheelchair, bedside commode, etc,.)?: None Help needed moving to and from a bed to  chair (including a wheelchair)?: None Help needed walking in hospital room?: A Little Help needed climbing 3-5 steps with a railing? : A Little 6 Click Score: 22    End of Session Equipment Utilized During Treatment: Gait belt Activity Tolerance: Patient tolerated treatment well Patient left: in chair;with call bell/phone within reach;with family/visitor present Nurse Communication: Mobility status PT Visit Diagnosis: Unsteadiness on feet (R26.81);Muscle weakness (generalized) (M62.81)    Time: 1023-1050 PT Time Calculation (min) (ACUTE ONLY): 27 min   Charges:   PT Evaluation $PT Eval Moderate Complexity: 1 Mod PT Treatments $Gait Training: 8-22 mins   PT G Codes:        Kahlen Boyde,PT Acute Rehabilitation [phone] (pager)   Denice Paradise 01/21/2017, 1:28 PM

## 2017-01-21 NOTE — Discharge Summary (Signed)
Discharge Summary  Nicole Cabrera HEN:[id number] DOB: 1945/10/15  PCP: [name], MD  Admit date: 01/17/2017 Discharge date: 01/21/2017  Time spent: >44mins  Recommendations for Outpatient Follow-up:  1. Neurology in 6 weeks 2. PCP in 5 days  Discharge Diagnoses:  Active Hospital Problems   Diagnosis Date Noted  . Cerebral embolism with cerebral infarction 01/18/2017  . Intracerebral hemorrhage 01/18/2017  . Elevated troponin 01/17/2017    Resolved Hospital Problems   Diagnosis Date Noted Date Resolved  No resolved problems to display.    Discharge Condition: Stable  Diet recommendation: Heart healthy  Vitals:   01/21/17 1027 01/21/17 1230  BP: (!) 145/90 (!) 144/77  Pulse: 81   Resp: 17   Temp: 97.8 F (36.6 C) 97.8 F (36.6 C)  SpO2: 93%     History of present illness:  71 year old female with past medical history of hypertension, paroxysmal A. fib, obstructive sleep apnea, morbid obesity, presented with slurred speech and headaches.  MRI brain showed acute infarct with petechial hemorrhage versus PRES. patient was also found to have elevated troponin.  Patient admitted for further management  Today, patient reports significant improvement, denies any chest pain shortness of breath, headaches, abdominal pain, nausea/vomiting, fever/chills, dizziness.  No focal neurologic deficit noted.  Patient still has peripheral visual loss and has been strongly advised against driving until reevaluated by neurology as an outpatient.  Hospital Course:  Active Problems:   Elevated troponin   Cerebral embolism with cerebral infarction   Intracerebral hemorrhage  #Acute CVA due to right parietal infarct, subacute left parietal infarct with PRES Improved Likely cardioembolic secondary to A. fib, was not on any anticoagulation MRI showed acute small right parietal infarct, subacute left patchy parietal infarct with petechial hemorrhage CT head negative Echo showed LVEF  55-60% Lipid panel with LDL 99, started on statins Neurology on board, recommend starting patient on Eliquis and discontinuing home aspirin Patient advised to monitor for any signs of bleeding Follow up with neurology in 6 weeks PT/OT as an outpt  #Elevated troponin Chest pain-free, denies shortness of breath Echo as above Cardiology on board, unlikely ACS Coronary CTA with large LAA with no thrombus, low calcium score of 7, only mild nonobstructive CAD Continue lopressor and statins  #Paroxysmal Afib CHADSVACs score of 5 NSR, rate controlled Continue Lopressor, Eliquis  #Hypertension Stable Restart home meds losartan-HCT, p.o. Lopressor  #Hyperlipidemia LDL 99 Continue statin  #Prediabetes A1c 6.3 Lifestyle modification PCP to follow-up patient's  #Obstructive sleep apnea Continue CPAP    Procedures: None  Consultations:  Neurology  Cardiology  Discharge Exam: BP (!) 144/77 (BP Location: Left Arm)   Pulse 81   Temp 97.8 F (36.6 C) (Oral)   Resp 17   Ht 4' 11.5" (1.511 m)   Wt 112.9 kg (249 lb)   LMP 01/22/2011   SpO2 93%   BMI 49.45 kg/m   General: Awake, alert, oriented x3, normal speech Cardiovascular: S1-S2 present, no added heart sounds Respiratory: Chest clear bilaterally  Discharge Instructions You were cared for by a hospitalist during your hospital stay. If you have any questions about your discharge medications or the care you received while you were in the hospital after you are discharged, you can call the unit and asked to speak with the hospitalist on call if the hospitalist that took care of you is not available. Once you are discharged, your primary care physician will handle any further medical issues. Please note that NO REFILLS for any discharge  medications will be authorized once you are discharged, as it is imperative that you return to your primary care physician (or establish a relationship with a primary care physician if you do  not have one) for your aftercare needs so that they can reassess your need for medications and monitor your lab values.  Discharge Instructions    Ambulatory referral to Neurology    Complete by:  As directed    An appointment is requested in approximately: 6 weeks Follow up with stroke clinic (Dr Leonie Man preferred, if not available, then consider Cecille Rubin, Dr  Caesar Chestnut, [facility] or Jaynee Eagles whoever is available) at [facility] in about 6-8 weeks. Thanks.   Diet - low sodium heart healthy    Complete by:  As directed    Increase activity slowly    Complete by:  As directed      Allergies as of 01/21/2017      Reactions   Bactrim [sulfamethoxazole-trimethoprim] Other (See Comments)   Unknown    Ciprofloxacin Other (See Comments)   GI upset   Hydroxyzine Hcl    Other reaction(s): Other (See Comments) Pt becomes physchotic.   Keflet [cephalexin] Other (See Comments)   GI upset   Lisinopril Cough   Spironolactone Nausea Only   Vistaril [hydroxyzine Hcl] Other (See Comments)   Pt becomes physchotic.   Adhesive [tape] Itching, Rash, Other (See Comments)   Burning   Erythromycin Rash   Latex Rash   Penicillins Rash   Has patient had a PCN reaction causing immediate rash, facial/tongue/throat swelling, SOB or lightheadedness with hypotension: Yes Has patient had a PCN reaction causing severe rash involving mucus membranes or skin necrosis: No Has patient had a PCN reaction that required hospitalization No Has patient had a PCN reaction occurring within the last 10 years: No If all of the above answers are "NO", then may proceed with Cephalosporin use.   Tetracycline Rash      Medication List    STOP taking these medications   aspirin EC 81 MG tablet     TAKE these medications   acetaminophen 500 MG tablet Commonly known as:  TYLENOL Take 500 mg by mouth every 6 (six) hours as needed for moderate pain.   albuterol 108 (90 Base) MCG/ACT inhaler Commonly known as:  PROVENTIL  HFA;VENTOLIN HFA Inhale 2 puffs into the lungs every 6 (six) hours as needed for wheezing or shortness of breath.   ALPRAZolam 0.5 MG tablet Commonly known as:  XANAX Take 0.25 mg by mouth daily as needed for anxiety.   apixaban 5 MG Tabs tablet Commonly known as:  ELIQUIS Take 1 tablet (5 mg total) by mouth 2 (two) times daily.   atorvastatin 40 MG tablet Commonly known as:  LIPITOR Take 1 tablet (40 mg total) by mouth daily at 6 PM.   cholecalciferol 1000 units tablet Commonly known as:  VITAMIN D Take 1,000 Units by mouth every morning.   losartan-hydrochlorothiazide 100-25 MG tablet Commonly known as:  HYZAAR Take 1 tablet by mouth every morning.   metoprolol tartrate 25 MG tablet Commonly known as:  LOPRESSOR Take 1 tablet (25 mg total) by mouth 2 (two) times daily.   Misc. Devices Misc by Does not apply route at bedtime. CPAP   multivitamin with minerals tablet Take 1 tablet by mouth every morning. Centrum Silver Womens 50+   potassium chloride SA 20 MEQ tablet Commonly known as:  K-DUR,KLOR-CON Take 1 tablet (20 mEq total) by mouth daily.  Allergies  Allergen Reactions  . Bactrim [Sulfamethoxazole-Trimethoprim] Other (See Comments)    Unknown   . Ciprofloxacin Other (See Comments)    GI upset  . Hydroxyzine Hcl     Other reaction(s): Other (See Comments) Pt becomes physchotic.  Marland Kitchen Keflet [Cephalexin] Other (See Comments)    GI upset  . Lisinopril Cough  . Spironolactone Nausea Only  . Vistaril [Hydroxyzine Hcl] Other (See Comments)    Pt becomes physchotic.  . Adhesive [Tape] Itching, Rash and Other (See Comments)    Burning  . Erythromycin Rash  . Latex Rash  . Penicillins Rash    Has patient had a PCN reaction causing immediate rash, facial/tongue/throat swelling, SOB or lightheadedness with hypotension: Yes Has patient had a PCN reaction causing severe rash involving mucus membranes or skin necrosis: No Has patient had a PCN reaction that  required hospitalization No Has patient had a PCN reaction occurring within the last 10 years: No If all of the above answers are "NO", then may proceed with Cephalosporin use.   . Tetracycline Rash   Follow-up Information    Garvin Fila, MD. Schedule an appointment as soon as possible for a visit in 6 week(s).   Specialties:  Neurology, Radiology Contact information: [address] [place] [address] [phone]        [name], MD. Schedule an appointment as soon as possible for a visit in 5 day(s).   Specialty:  Family Medicine Contact information: [place] [address] [phone]            The results of significant diagnostics from this hospitalization (including imaging, microbiology, ancillary and laboratory) are listed below for reference.    Significant Diagnostic Studies: Ct Angio Head W Or Wo Contrast  Result Date: 01/18/2017 CLINICAL DATA:  Stroke EXAM: CT ANGIOGRAPHY HEAD TECHNIQUE: Multidetector CT imaging of the head was performed using the standard protocol during bolus administration of intravenous contrast. Multiplanar CT image reconstructions and MIPs were obtained to evaluate the vascular anatomy. CONTRAST:  50 mL Isovue 370 IV COMPARISON:  MRI head 01/18/2017.  CT head 01/17/2017 FINDINGS: CTA HEAD Anterior circulation: Ectatic and tortuous cervical internal carotid artery bilaterally. Mild atherosclerotic disease in the cavernous carotid bilaterally without stenosis. Anterior middle cerebral artery is patent bilaterally without stenosis. Posterior circulation: Distal vertebral arteries patent bilaterally without stenosis. Basilar widely patent. PICA, AICA, superior cerebellar, and posterior cerebral artery is patent bilaterally without stenosis Venous sinuses: Patent Anatomic variants: None Delayed phase: Normal enhancement. Subtle hypodensity left occipital parietal lobe compatible with recent infarct is noted  on MRI. Small right occipital infarct as noted on MRI. Negative for intracranial hemorrhage. IMPRESSION: No significant intracranial stenosis Recent small infarcts in the left occipital parietal lobe and right occipital lobe without hemorrhage. Electronically Signed   By: Franchot Gallo M.D.   On: 01/18/2017 10:19   Mr Brain Wo Contrast  Result Date: 01/18/2017 CLINICAL DATA:  Word-finding difficulties after lunch yesterday. Confusion. History of hypertension, diabetes. EXAM: MRI HEAD WITHOUT CONTRAST TECHNIQUE: Multiplanar, multiecho pulse sequences of the brain and surrounding structures were obtained without intravenous contrast. COMPARISON:  CT HEAD January 17, 2017 and MRI of the head March 22, 2013 FINDINGS: BRAIN: Reduced diffusion LEFT greater than RIGHT parietal cortex. Relatively normalized ADC values on the LEFT with susceptibility artifact. Low ADC values on the RIGHT. A few scattered chronic micro hemorrhages present. Patchy supratentorial white matter FLAIR T2 hyperintensities. Ventricles and sulci are normal  for patient's age. No midline shift, mass effect or masses. Prominent basal ganglia perivascular spaces associated with chronic small vessel ischemic disease. No abnormal extra-axial fluid collections. VASCULAR: Normal major intracranial vascular flow voids present at skull base. SKULL AND UPPER CERVICAL SPINE: No abnormal sellar expansion. No suspicious calvarial bone marrow signal. Craniocervical junction maintained. SINUSES/ORBITS: Trace ethmoid mucosal thickening. Small LEFT mastoid effusion. The included ocular globes and orbital contents are non-suspicious. OTHER: None. IMPRESSION: 1. Acute small RIGHT parietal infarct. Subacute LEFT patchy parietal infarct with petechial hemorrhage. Though PCA territory infarcts are possible, findings favor sequelae of PRES. 2. Mild to moderate chronic small vessel ischemic disease. Electronically Signed   By: Elon Alas M.D.   On: 01/18/2017  01:42   Ct Coronary Morph W/cta Cor W/score W/ca W/cm &/or Wo/cm  Addendum Date: 01/20/2017   ADDENDUM REPORT: 01/20/2017 11:30 EXAM: OVER-READ INTERPRETATION  CT CHEST The following report is an over-read performed by radiologist Dr. Collene Leyden [facility] on 01/20/2017. This over-read does not include interpretation of cardiac or coronary anatomy or pathology. The coronary CTA Interpretation by the cardiologist is attached. COMPARISON:  None. FINDINGS: Heart is mildly enlarged.  Aorta is normal caliber. No adenopathy in the lower mediastinum or hila. Linear atelectasis or scarring in the lung bases. No effusions. 8 mm nodule in the right middle lobe on image 18. 7 mm nodule in the right middle lobe on image 27. 5 mm nodule in the right lower lobe on image 24. Imaging into the upper abdomen shows no acute findings. Chest wall soft tissues are unremarkable. No acute bony abnormality. IMPRESSION: Several small nodules in the right middle lobe and right lower lobe, the largest 8 mm. Non-contrast chest CT at 3-6 months is recommended. If the nodules are stable at time of repeat CT, then future CT at 18-24 months (from today's scan) is considered optional for low-risk patients, but is recommended for high-risk patients. This recommendation follows the consensus statement: Guidelines for Management of Incidental Pulmonary Nodules Detected on CT Images: From the Fleischner Society 2017; Radiology 2017; 284:228-243. Electronically Signed   By: Rolm Baptise M.D.   On: 01/20/2017 11:30   Result Date: 01/20/2017 CLINICAL DATA:  80 -year-old female with h/o PAF who was admitted with an acute multifocal ischemic CVAs and elevated troponin. Evaluate for CAD. EXAM: Cardiac/Coronary  CT TECHNIQUE: The patient was scanned on a [employer]. FINDINGS: A 120 kV prospective scan was triggered in the descending thoracic aorta at 111 HU's. Axial non-contrast 3 mm slices were carried out through the  heart. The data set was analyzed on a dedicated work station and scored using the [place]. Gantry rotation speed was 250 msecs and collimation was .6 mm. 10 mg of iv Metoprolol and 0.8 mg of sl NTG was given. The 3D data set was reconstructed in 5% intervals of the 67-82 % of the R-R cycle. Diastolic phases were analyzed on a dedicated work station using MPR, MIP and VRT modes. The patient received 80 cc of contrast. Aorta:  Normal size.  No calcifications.  No dissection. Aortic Valve:  Trileaflet.  No calcifications. Coronary Arteries:  Normal coronary origin.  Right dominance. Study quality is affected by patient's size and poor contrast timing. RCA is a large dominant artery that gives rise to PDA and PLVB. There is mild diffuse non-calcified plaque. Left main is a large artery that gives rise to LAD and LCX arteries. There is no plaque. LAD is a medium caliber  vessel that gives rise to one diagonal branch. There is mild calcified plaque in the proximal LAD with associated stenosis 25-50%. Mid LAD has mild noncalcified plaque. Distal LAD has very small lumen. D1 has no obvious plaque. LCX is a non-dominant artery that gives rise to one OM1 branch. There is diffuse mild noncalcified plaque. Other findings: Normal pulmonary vein drainage into the left atrium. Normal left atrial appendage without a thrombus. Mildly dilated pulmonary artery measuring 32 mm suspicious for pulmonary hypertension. Severe mitral annular calcifications. IMPRESSION: 1. Coronary calcium score of 7. This was 59 percentile for age and sex matched control. 2. Normal coronary origin with right dominance. 3. Study quality is affected by patient's size and poor contrast timing. However, there appears to be only mild diffuse non-obstructive CAD. Aggressive risk factor modification is recommended. 4. Large left atrial appendage without a thrombus. 5. Mildly dilated pulmonary artery measuring 32 mm suspicious for pulmonary hypertension. 6.  Severe mitral annular calcifications. Ena Dawley Electronically Signed: By: Ena Dawley On: 01/19/2017 18:07   Dg Chest Portable 1 View  Result Date: 01/17/2017 CLINICAL DATA:  Elevated troponin EXAM: PORTABLE CHEST 1 VIEW COMPARISON:  09/14/2016 FINDINGS: Mild cardiomegaly with central vascular congestion. No pleural effusion or focal consolidation. No pneumothorax. IMPRESSION: Cardiomegaly with vascular congestion. Electronically Signed   By: Donavan Foil M.D.   On: 01/17/2017 19:47   Dg Hip Unilat With Pelvis 2-3 Views Left  Result Date: 01/20/2017 This exam has been resulted and the report is available in the [facility] system and it can be accessed by selecting the PACS imaging link.  Ct Head Code Stroke Wo Contrast  Result Date: 01/17/2017 CLINICAL DATA:  Code stroke. Altered level of consciousness. Could not remember how to get home. EXAM: CT HEAD WITHOUT CONTRAST TECHNIQUE: Contiguous axial images were obtained from the base of the skull through the vertex without intravenous contrast. COMPARISON:  08/08/2015 FINDINGS: Brain: No evidence of acute infarction, hemorrhage, hydrocephalus, extra-axial collection or mass lesion/mass effect. Mild volume loss and chronic small vessel ischemia. Vascular: Atherosclerotic calcification.  No high-density vessel. Skull: Negative for fracture Sinuses/Orbits: Negative Other: These results were called by telephone at the time of interpretation on 01/17/2017 at 4:28 pm to Dr. Virgel Manifold , who verbally acknowledged these results. ASPECTS [facility] Stroke Program Early CT Score) Not scored with this history IMPRESSION: No acute finding or change from 2017. Electronically Signed   By: Monte Fantasia M.D.   On: 01/17/2017 16:29    Microbiology: Recent Results (from the past 240 hour(s))  MRSA PCR Screening     Status: None   Collection Time: 01/17/17 11:22 PM  Result Value Ref Range Status   MRSA by PCR NEGATIVE NEGATIVE Final    Comment:         The GeneXpert MRSA Assay (FDA approved for NASAL specimens only), is one component of a comprehensive MRSA colonization surveillance program. It is not intended to diagnose MRSA infection nor to guide or monitor treatment for MRSA infections.      Labs: Basic Metabolic Panel:  Recent Labs Lab 01/17/17 1629 01/17/17 1648 01/18/17 0223 01/18/17 0745 01/19/17 0214 01/20/17 0330 01/21/17 0339  NA 141 140  --  136 135 136 137  K 3.3* 3.2*  --  3.5 3.2* 3.9 4.0  CL 103 99*  --  103 104 104 106  CO2 28  --   --  24 25 22 24   GLUCOSE 129* 125*  --  108* 104* 113* 102*  BUN 16 17  --  9 7 7 13   CREATININE 0.63 0.60  --  0.61 0.60 0.61 0.64  CALCIUM 9.2  --   --  8.5* 8.3* 8.5* 8.8*  MG  --   --  1.8  --   --  2.1  --    Liver Function Tests:  Recent Labs Lab 01/17/17 1629 01/18/17 0745  AST 34 41  ALT 32 33  ALKPHOS 81 81  BILITOT 0.5 0.8  PROT 7.1 6.4*  ALBUMIN 3.8 3.3*   No results for input(s): LIPASE, AMYLASE in the last 168 hours.  Recent Labs Lab 01/18/17 0223  AMMONIA 28   CBC:  Recent Labs Lab 01/17/17 1629 01/17/17 1648 01/18/17 0745 01/19/17 0214 01/20/17 0330 01/21/17 0339  WBC 9.7  --  10.3 9.8 10.5 10.5  NEUTROABS 6.0  --   --  5.6  --   --   HGB 14.5 14.6 14.3 13.7 14.1 14.3  HCT 45.6 43.0 43.1 42.1 44.3 44.4  MCV 87.7  --  86.2 85.9 86.7 87.1  PLT 299  --  313 264 258 243   Cardiac Enzymes:  Recent Labs Lab 01/17/17 1749 01/18/17 0223 01/18/17 0745 01/18/17 1151  TROPONINI 1.45* 2.15* 1.60* 0.96*   BNP: BNP (last 3 results) No results for input(s): BNP in the last 8760 hours.  ProBNP (last 3 results) No results for input(s): PROBNP in the last 8760 hours.  CBG:  Recent Labs Lab 01/18/17 0005  GLUCAP 122*       Signed:  Alma Friendly, MD [facility] 01/21/2017, 2:08 PM

## 2017-01-21 NOTE — Care Management Note (Addendum)
Case Management Note  Patient Details  Name: TOSHIBA NULL MRN: [id number] Date of Birth: 10/28/1945  Subjective/Objective:     Pt admitted with Cerebral infection               Action/Plan:  PTA from home independent. Pt has PCP and denied barriers to obtaining and paying for medications as prescribed.  Pt will discharge home on Eliquis - beneift check submitted - prior auth required.  CM communicated requirement with attending and attending has agreed to perform prior to discharge from facility.  CM provided free 30 day Eliquis card to pt and confirmed with preferred pharmacy that inventory is available for fill and also that pharmacy will accept free 30day card.  CM received verbal from attending to arrange outpt PT and OT - pt chose [facility] - referral made through epic.    Expected Discharge Date:  01/21/17               Expected Discharge Plan:  Home/Self Care (outpt OT recommended)  In-House Referral:     Discharge planning Services  CM Consult  Post Acute Care Choice:    Choice offered to:  Patient  DME Arranged:    DME Agency:     HH Arranged:   ([place]) [place] Agency:     Status of Service:  Completed, signed off  If discussed at [employer] of Stay Meetings, dates discussed:    Additional Comments:  Maryclare Labrador, RN 01/21/2017, 1:19 PM

## 2017-01-21 NOTE — Progress Notes (Signed)
Benefit check for Eliquis    RE: Benefit check  Received: Today  Message Contents  Memory Argue CNA        # 2. S/W  ABBY @ [facility] # [id number]    ELIQUIS 5 MG BID   COVER- YES  CO-PAY- ZERO DOLLARS  PRIOR APPROVAL-YES # [phone]   PHARMACY : [facility]

## 2017-01-21 NOTE — Progress Notes (Signed)
Care manager talked pt and son regarding Elquis medication due to expensive drugs. Patient and son understand it. Medication prescription via e-prescription to pt's pharmacy. Removed PIV x 3. Provide discharge instruction to patient and her son and provided PT/OT prescriptions. Pt's left the room. HS [employer]

## 2017-01-21 NOTE — Progress Notes (Signed)
ANTICOAGULATION CONSULT NOTE - Follow Up Consult  Pharmacy Consult for Eliquis Indication: afib, new CVA  Allergies  Allergen Reactions  . Bactrim [Sulfamethoxazole-Trimethoprim] Other (See Comments)    Unknown   . Ciprofloxacin Other (See Comments)    GI upset  . Hydroxyzine Hcl     Other reaction(s): Other (See Comments) Pt becomes physchotic.  Marland Kitchen Keflet [Cephalexin] Other (See Comments)    GI upset  . Lisinopril Cough  . Spironolactone Nausea Only  . Vistaril [Hydroxyzine Hcl] Other (See Comments)    Pt becomes physchotic.  . Adhesive [Tape] Itching, Rash and Other (See Comments)    Burning  . Erythromycin Rash  . Latex Rash  . Penicillins Rash    Has patient had a PCN reaction causing immediate rash, facial/tongue/throat swelling, SOB or lightheadedness with hypotension: Yes Has patient had a PCN reaction causing severe rash involving mucus membranes or skin necrosis: No Has patient had a PCN reaction that required hospitalization No Has patient had a PCN reaction occurring within the last 10 years: No If all of the above answers are "NO", then may proceed with Cephalosporin use.   . Tetracycline Rash    Patient Measurements: Height: 4' 11.5" (151.1 cm) Weight: 249 lb (112.9 kg) IBW/kg (Calculated) : 44.35 Heparin Dosing Weight:  72.4 kg  Vital Signs: Temp: 97.9 F (36.6 C) (10/31 2343) Temp Source: Oral (10/31 2343) BP: 146/84 (11/01 0529) Pulse Rate: 77 (11/01 0529)  Labs:  Recent Labs  01/18/17 1151  01/18/17 2326  01/19/17 0214 01/20/17 0330 01/21/17 0339  HGB  --   --   --   < > 13.7 14.1 14.3  HCT  --   --   --   --  42.1 44.3 44.4  PLT  --   --   --   --  264 258 243  HEPARINUNFRC  --   < > 0.31  --  0.28* 0.34  --   CREATININE  --   --   --   --  0.60 0.61 0.64  TROPONINI 0.96*  --   --   --   --   --   --   < > = values in this interval not displayed.  Estimated Creatinine Clearance: 72 mL/min (by C-G formula based on SCr of 0.64  mg/dL).   Assessment:  Anticoag: Afib - on heparin, Chadsvasc 6.  Higher risk for bleeding given suspected blood in stool and epistaxis?  However, due to rising troponins and potential that current CVA caused by afib, MD decided to start heparin, but target lower HL 0.3-0.5  Heparin transitioned to Eliquis 10/31.   Goal of Therapy:  Monitor platelets by anticoagulation protocol: Yes   Plan:  Eliquis 5 mg BID.   No ASA or Plavix for now per Neuro rec's.     Uvaldo Rising, BCPS  Clinical Pharmacist Pager [phone]  01/21/2017 9:47 AM

## 2017-01-21 NOTE — Evaluation (Signed)
Occupational Therapy Evaluation Patient Details Name: Nicole Cabrera MRN: [id number] DOB: 1945/11/13 Today's Date: 01/21/2017    History of Present Illness 71 year old female with past medical history of hypertension, paroxysmal A. fib, obstructive sleep apnea, morbid obesity, presented with slurred speech, headaches.  MRI brain showed acute infarcts with petechial hemorrhage versus PRES.  Patient was also found to have elevated troponin.  Patient admitted for further management  MRI showed Acute small RIGHT parietal infarct. Subacute LEFT patchy parietal infarct with petechial hemorrhage. Though PCA territory infarcts are possible, findings favor sequelae of PRES. 2. Mild to moderate chronic small vessel ischemic disease. CT showed Recent small infarcts in the left occipital parietal lobe and right occipital lobe without hemorrhage.    Clinical Impression   .Pt admitted with above. She demonstrates the below listed deficits. She demonstrated difficulty with higher level cognitive skills.   She lives at home with spouse who is supportive.  Feel she will benefit from [place], and recommend pt does not drive until cleared by OT - pt and son in agreement with the below plan.  Pt for discharge today.  Will defer further OT to OP.     Follow Up Recommendations  Outpatient OT;Supervision - Intermittent    Equipment Recommendations  None recommended by OT    Recommendations for Other Services       Precautions / Restrictions Precautions Precautions: Fall Restrictions Weight Bearing Restrictions: No      Mobility Bed Mobility Overal bed mobility: Independent                Transfers Overall transfer level: Independent Equipment used: Straight cane                  Balance Overall balance assessment: Needs assistance Sitting-balance support: No upper extremity supported;Feet supported Sitting balance-Leahy Scale: Good     Standing balance support: Single extremity  supported;During functional activity Standing balance-Leahy Scale: Fair Standing balance comment: Pt can stand statically without UE support.  Stood at sink and washed hands.                   Standardized Balance Assessment Standardized Balance Assessment : Dynamic Gait Index   Dynamic Gait Index Level Surface: Normal Change in Gait Speed: Normal Gait with Horizontal Head Turns: Normal Gait with Vertical Head Turns: Normal Gait and Pivot Turn: Mild Impairment Step Over Obstacle: Mild Impairment Step Around Obstacles: Normal Steps: Mild Impairment Total Score: 21     ADL either performed or assessed with clinical judgement   ADL Overall ADL's : Needs assistance/impaired Eating/Feeding: Independent   Grooming: Wash/dry hands;Wash/dry face;Oral care;Brushing hair;Supervision/safety;Standing   Upper Body Bathing: Set up;Supervision/ safety;Sitting   Lower Body Bathing: Supervison/ safety;Sit to/from stand   Upper Body Dressing : Set up;Supervision/safety;Sitting   Lower Body Dressing: Supervision/safety;Sit to/from stand   Toilet Transfer: Supervision/safety;Ambulation;Comfort height toilet;Grab bars;RW   Toileting- Clothing Manipulation and Hygiene: Supervision/safety;Sit to/from stand       Functional mobility during ADLs: Supervision/safety;Cane       Vision Baseline Vision/History: Wears glasses Wears Glasses: Reading only Patient Visual Report: Other (comment) Vision Assessment?: Yes Eye Alignment: Within Functional Limits Ocular Range of Motion: Within Functional Limits Alignment/Gaze Preference: Within Defined Limits Tracking/Visual Pursuits: Able to track stimulus in all quads without difficulty Visual Fields: No apparent deficits Additional Comments: Pt reports when even occurred, she was driving on wrong side of road, and was seeing "spirals" on people's faces.   She does not have reading glasses  with her, so unable to accurately assess reading  abilities.  She has been able to read font on her phone.  She is able to read clock accurately, read headlines, and scan ads for info without difficulty      Perception Perception Perception Tested?: Yes Comments: no obvious deficits noted    Praxis Praxis Praxis tested?: Within functional limits    Pertinent Vitals/Pain Pain Assessment: No/denies pain     Hand Dominance Right   Extremity/Trunk Assessment Upper Extremity Assessment Upper Extremity Assessment: RUE deficits/detail RUE Deficits / Details: grossly 4/5 - 4+/5   Lower Extremity Assessment Lower Extremity Assessment: Defer to PT evaluation   Cervical / Trunk Assessment Cervical / Trunk Assessment: Normal   Communication Communication Communication: No difficulties   Cognition Arousal/Alertness: Awake/alert Behavior During Therapy: WFL for tasks assessed/performed Overall Cognitive Status: Impaired/Different from baseline Area of Impairment: Attention;Problem solving                   Current Attention Level: Alternating;Divided         Problem Solving: Difficulty sequencing;Requires verbal cues General Comments: Pt with difficulty with serial addition and subtraction by 2's while ambulating    General Comments  son reports pt with obvious cognitive deficits 2 days ago - she was unable to turn on her cell phone and had intermittent confusion.  He reports this is better, but both pt and son are in agreement with OPOT assessmeent     Exercises     Shoulder Instructions      Home Living Family/patient expects to be discharged to:: Private residence Living Arrangements: Spouse/significant other Available Help at Discharge: Family;Available 24 hours/day (husband or son can be present ) Type of Home: House Home Access: Ramped entrance     Home Layout: Two level;Laundry or work area in Building surveyor of Steps: 13 Alternate Level Stairs-Rails: Right Bathroom Shower/Tub:  Occupational psychologist: Handicapped height     Home Equipment: Marine scientist - single point          Prior Functioning/Environment Level of Independence: Independent        Comments: Pt uses cane.  She drives, does Doctor, hospital, as well as medication management         OT Problem List: Decreased cognition;Impaired balance (sitting and/or standing);Obesity;Decreased strength      OT Treatment/Interventions:      OT Goals(Current goals can be found in the care plan section) Acute Rehab OT Goals Patient Stated Goal: to go home OT Goal Formulation: All assessment and education complete, DC therapy  OT Frequency:     Barriers to D/C:            Co-evaluation              AM-PAC PT "6 Clicks" Daily Activity     Outcome Measure Help from another person eating meals?: None Help from another person taking care of personal grooming?: A Little Help from another person toileting, which includes using toliet, bedpan, or urinal?: A Little Help from another person bathing (including washing, rinsing, drying)?: A Little Help from another person to put on and taking off regular upper body clothing?: A Little Help from another person to put on and taking off regular lower body clothing?: A Little 6 Click Score: 19   End of Session Nurse Communication: Mobility status  Activity Tolerance: Patient tolerated treatment well Patient left: in chair;with call bell/phone within reach;with family/visitor present  OT Visit Diagnosis:  Cognitive communication deficit (R41.841) Symptoms and signs involving cognitive functions: Cerebral infarction                Time: 1030-1314 OT Time Calculation (min): 36 min Charges:  OT General Charges $OT Visit: 1 Visit OT Evaluation $OT Eval Moderate Complexity: 1 Mod OT Treatments $Therapeutic Activity: 8-22 mins G-Codes:     [facility], OTR/L [phone]   Lucille Passy M 01/21/2017, 1:35 PM

## 2017-01-25 ENCOUNTER — Other Ambulatory Visit: Payer: Self-pay

## 2017-01-25 NOTE — Patient Outreach (Signed)
[facility]) Care Management  01/25/2017  TYLENE QUASHIE [birth year] [id number]   EMMI: stroke Referral date: 01/25/17 Referral source: EMMI stroke red alert Referral reason: Feeling worse overall: YES, Scheduled a follow up appointment: NO Day # 1  Telephone call to patient regarding EMMI stroke red alert. HIPAA verified with patient. RNCM discussed EMMI stroke follow up program. Patient states she has scheduled a follow up appointment with her primary doctor for 01/27/17.  Patient states she will call today to scheduled outpatient rehab and appointment with neurologist. Patient states she has all of her medications and is taking them as prescribed. Patient states she has transportation to her appointments. Patient denies any new onset of symptoms. RNCM reviewed signs/ symptoms of stroke with patient. Advised to call 911 for stroke like symptoms.  RNCM advised patient to notify MD of any changes in condition prior to scheduled appointment. RNCM verified patient aware of 911 services for urgent/ emergent needs. Patient denies having any additional needs. Patient verbally agreed to ongoing EMMI stroke calls.   ASSESSMENT; Per patients discharge summary  Admit date: 01/17/2017 Discharge date: 01/21/2017 Discharge Diagnoses:       Active Hospital Problems   Diagnosis Date Noted  . Cerebral embolism with cerebral infarction 01/18/2017  . Intracerebral hemorrhage 01/18/2017  . Elevated troponin 01/17/2017    71 year old female with past medical history of hypertension, paroxysmal A. fib, obstructive sleep apnea, morbid obesity,  PLAN: RNCM will refer patient to care management assistant to close due to patietn being assessed and having no further needs.   Reatha Sur RN,BSN,CCM [facility] Telephonic  [phone]

## 2017-02-05 ENCOUNTER — Ambulatory Visit (HOSPITAL_COMMUNITY): Payer: 59 | Admitting: Physical Therapy

## 2017-02-05 ENCOUNTER — Other Ambulatory Visit: Payer: Self-pay

## 2017-02-05 ENCOUNTER — Ambulatory Visit (HOSPITAL_COMMUNITY): Payer: 59 | Attending: Internal Medicine | Admitting: Physical Therapy

## 2017-02-05 ENCOUNTER — Ambulatory Visit (HOSPITAL_COMMUNITY): Payer: 59

## 2017-02-05 ENCOUNTER — Encounter (HOSPITAL_COMMUNITY): Payer: Self-pay

## 2017-02-05 ENCOUNTER — Encounter (HOSPITAL_COMMUNITY): Payer: 59

## 2017-02-05 DIAGNOSIS — R29898 Other symptoms and signs involving the musculoskeletal system: Secondary | ICD-10-CM | POA: Insufficient documentation

## 2017-02-05 DIAGNOSIS — M6281 Muscle weakness (generalized): Secondary | ICD-10-CM | POA: Diagnosis present

## 2017-02-05 DIAGNOSIS — R2681 Unsteadiness on feet: Secondary | ICD-10-CM | POA: Insufficient documentation

## 2017-02-05 NOTE — Patient Instructions (Signed)
Balance: Unilateral   At a counter Attempt to balance on left leg, eyes open. Hold _2-30___ seconds. Repeat _5___ times per set. Do ___1_ sets per session. Do _2___ sessions per day. Perform exercise with eyes closed.2  http://orth.exer.us/28   Copyright  VHI. All rights reserved.  Functional Quadriceps: Sit to Stand   Stomach tight  Sit on edge of chair, feet flat on floor. Stand upright, extending knees fully. Repeat _5-10__ times per set. Do __1__ sets per session. Do _2___ sessions per day.  http://orth.exer.us/734   Copyright  VHI. All rights reserved.  Strengthening: Hip Abduction (Side-Lying)    Tighten muscles on front of left thigh, then lift leg _12___ inches from surface, keeping knee locked.  Repeat __10__ times per set. Do ___1_ sets per session. Do _2___ sessions per day.  http://orth.exer.us/622   Copyright  VHI. All rights reserved.  Bridging    Slowly raise buttocks from floor, keeping stomach tight. Repeat __10__ times per set. Do __1__ sets per session. Do __2_ sessions per day.  http://orth.exer.us/1096   Copyright  VHI. All rights reserved.  Strengthening: Straight Leg Raise (Phase 1)    Tighten muscles on front of right thigh, then lift leg __15__ inches from surface, keeping knee locked.  Repeat ___10_ times per set. Do __1__ sets per session. Do ____2 sessions per day.  http://orth.exer.us/614   Copyright  VHI. All rights reserved.

## 2017-02-05 NOTE — Patient Instructions (Signed)
See scanned exercise sheet.

## 2017-02-05 NOTE — Therapy (Signed)
[place], [place], [address] Phone: [phone]   Fax:  [phone]  Occupational Therapy Evaluation  Patient Details  Name: Nicole Cabrera MRN: [id number] Date of Birth: [birth year] Referring Provider: Alma Friendly, MD   Encounter Date: 02/05/2017  OT End of Session - 02/05/17 1106    Visit Number  1    Number of Visits  1    Authorization Type  [employer]    OT Start Time  [phone]    OT Stop Time  0930    OT Time Calculation (min)  25 min    Activity Tolerance  Patient tolerated treatment well    Behavior During Therapy  [facility] for tasks assessed/performed       Past Medical History:  Diagnosis Date  . Anxiety    xanax  . Arthritis    shoulders/hips - tx with OTC meds  . Breast cyst 03/1990   pt denies 12/05/13  . Bronchitis   . Cervical tab 1972  . 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 Diabetes mellitus without complication ([place])    stated not diabetic but her AIC has been high so she is monitored closely   . Endometrial polyp 04/2000  . Hemorrhoids   . Hypertension   . Kidney stones   . Obesity   . Osteopenia 05/2007  . Peripheral neuropathy   . PMB (postmenopausal bleeding) 04/2000  . Renal stone    seen on [place] 1/16 or 2/16  . SAB (spontaneous abortion) 1981  . Shortness of breath    with exertion  . Sleep apnea    CPAP  . SVD (spontaneous vaginal delivery) 04/1964, 05/1978   x3  . TIA (transient ischemic attack)     Past Surgical History:  Procedure Laterality Date  . CERVICAL DISC SURGERY  2/93  . CESAREAN SECTION  6/83   with BTL  . DILATATION & CURETTAGE/HYSTEROSCOPY WITH RESECTOCOPE N/A 01/23/2011   Performed by Lyman Speller, MD at [facility] ORS  . HYSTEROSCOPY  5/02, 01/2011  . LITHOTRIPSY  08/30/2014   with Dr. Jeffie Pollock at [facility]  . TOTAL KNEE ARTHROPLASTY Right 6/08   right  . TOTAL KNEE ARTHROPLASTY Left 4/10   left   . WRIST SURGERY Left 4/07   left    There were no vitals filed for this visit.  Subjective Assessment - 02/05/17 1059    Subjective   S: There are times when I'll notice things out of the corner of my eye that aren't there when I look.     Pertinent History  Patient is a 71 y/o female S/P right parietal infarct, subacute left parietal infarct  which was sustained on 01/17/17. Patient reports that at the time of the stroke she was experiencing vision issues and higher cognitive deficits. Dr. Horris Latino, Adline Peals, MD has referred patient to occupational therapy for evaluation and treatment.     Patient Stated Goals  None stated    Currently in Pain?  No/denies        [facility] OT Assessment - 02/05/17 2878      Assessment   Diagnosis  CVA    Referring Provider  Alma Friendly, MD    Onset Date  01/17/17    Assessment  No follow up scheduled with nuerologist at this time.    Prior Therapy  Acute OT evaluation only      Precautions   Precautions  None  Restrictions   Weight Bearing Restrictions  No      Balance Screen   Has the patient fallen in the past 6 months  No      Home  Environment   Family/patient expects to be discharged to:  Private residence    Living Arrangements  Spouse/significant other      Prior Function   Level of [place]  Retired    Leisure  painting      ADL   ADL comments  Pt reports no difficulty completing daily tasks.       Mobility   Mobility Status  Independent      Written Expression   Dominant Hand  Right      Vision - History   Baseline Vision  Wears glasses for distance only    Visual History  Cataracts both eyes    Patient Visual Report  Unable to keep objects in focus      Vision Assessment   Eye Alignment  Within Functional Limits    Vision Assessment  Vision tested    Ocular Range of Motion  Within Functional Limits    Alignment/Gaze Preference  Within Defined Limits    Tracking/Visual Pursuits  Able to track stimulus in  all quads without difficulty    Convergence  Within functional limits    Visual Fields  No apparent deficits    Depth Perception  WFL      Cognition   Overall Cognitive Status  Within Functional Limits for tasks assessed      ROM / Strength   AROM / PROM / Strength  Strength BUE WFL in all ranges.                      OT Education - 02/05/17 1105    Education provided  Yes    Education Details  Pt provided with eye exercises.    Person(s) Educated  Patient    Methods  Explanation;Demonstration;Verbal cues;Handout    Comprehension  Verbalized understanding;Returned demonstration       OT Short Term Goals - 02/05/17 1108      OT SHORT TERM GOAL #1   Title  Pt will be educated on HEP to increase visual scanning and focus abilities to prepare her to return to driving.     Time  1    Period  Days    Status  Achieved    Target Date  02/05/17               Plan - 02/05/17 1106    Clinical Impression Statement  A: Pt presents with some mild occurances of difficulty keeping objects in focus at times. Peripheral visual checked and no deficits noted. Patient was provided with HEP for eye exercises to work on visual scanning and focus. No further OT needs at this time.    Occupational Profile and client history currently impacting functional performance  N/A    Occupational performance deficits (Please refer to evaluation for details):  -- N/A    Rehab Potential  Excellent    OT Frequency  One time visit    OT Treatment/Interventions  Patient/family education    Plan  P: One time visit with HEP.    Clinical Decision Making  Limited treatment options, no task modification necessary    Consulted and Agree with Plan of Care  Patient       Patient will benefit from skilled therapeutic intervention in  order to improve the following deficits and impairments:  (N/A)  Visit Diagnosis: Other symptoms and signs involving the musculoskeletal system - Plan: Ot plan of  care cert/re-cert  G-Codes - 25/05/39 1109    Functional Assessment Tool Used (Outpatient only)  clinical judgement    Functional Limitation  Other OT primary    Other OT Primary Current Status (J6734)  At least 1 percent but less than 20 percent impaired, limited or restricted    Other OT Primary Goal Status (L9379)  At least 1 percent but less than 20 percent impaired, limited or restricted    Other OT Primary Discharge Status [phone])  At least 1 percent but less than 20 percent impaired, limited or restricted       Problem List Patient Active Problem List   Diagnosis Date Noted  . Cerebral embolism with cerebral infarction 01/18/2017  . Intracerebral hemorrhage 01/18/2017  . Elevated troponin 01/17/2017  . Obesity 06/20/2015  . Nephrolithiasis 07/09/2014  . Asthma with bronchitis 02/23/2014  . Aphasia 02/27/2013  . ACUTE BRONCHITIS 08/30/2009  . HYPERTENSION 02/22/2007  . Obstructive sleep apnea 02/22/2007  . ALLERGY 02/22/2007   Ailene Ravel, OTR/L,CBIS  [phone]  02/05/2017, 11:11 AM  [place], [place], [address] Phone: [phone]   Fax:  [phone]  Name: Nicole Cabrera MRN: [id number] Date of Birth: Jan 30, 1946

## 2017-02-05 NOTE — Therapy (Signed)
[place], [place], [address] Phone: [phone]   Fax:  [phone]  Physical Therapy Evaluation  Patient Details  Name: Nicole Cabrera MRN: [id number] Date of Birth: 28-May-1945 Referring Provider: Dallas Breeding   Encounter Date: 02/05/2017  PT End of Session - 02/05/17 0936    Visit Number  1    Number of Visits  1    Date for PT Re-Evaluation  03/07/17    Authorization Type  [employer]    Authorization - Visit Number  1    Authorization - Number of Visits  1    PT Start Time  0820    PT Stop Time  0900    PT Time Calculation (min)  40 min    Activity Tolerance  Patient tolerated treatment well    Behavior During Therapy  [facility] for tasks assessed/performed       Past Medical History:  Diagnosis Date  . Anxiety    xanax  . Arthritis    shoulders/hips - tx with OTC meds  . Breast cyst 03/1990   pt denies 12/05/13  . Bronchitis   . Cervical tab 1972  . 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 Diabetes mellitus without complication ([place])    stated not diabetic but her AIC has been high so she is monitored closely   . Endometrial polyp 04/2000  . Hemorrhoids   . Hypertension   . Kidney stones   . Obesity   . Osteopenia 05/2007  . Peripheral neuropathy   . PMB (postmenopausal bleeding) 04/2000  . Renal stone    seen on [place] 1/16 or 2/16  . SAB (spontaneous abortion) 1981  . Shortness of breath    with exertion  . Sleep apnea    CPAP  . SVD (spontaneous vaginal delivery) 04/1964, 05/1978   x3  . TIA (transient ischemic attack)     Past Surgical History:  Procedure Laterality Date  . CERVICAL DISC SURGERY  2/93  . CESAREAN SECTION  6/83   with BTL  . DILATATION & CURETTAGE/HYSTEROSCOPY WITH RESECTOCOPE N/A 01/23/2011   Performed by Lyman Speller, MD at [facility] ORS  . HYSTEROSCOPY  5/02, 01/2011  . LITHOTRIPSY  08/30/2014   with Dr. Jeffie Pollock at [facility]  . TOTAL KNEE ARTHROPLASTY Right  6/08   right  . TOTAL KNEE ARTHROPLASTY Left 4/10   left   . WRIST SURGERY Left 4/07   left    There were no vitals filed for this visit.   Subjective Assessment - 02/05/17 0823    Subjective  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  [name] states that she was using a cane prior to her CVA due to falling after having knee surgery.  She states that her balance has been off since her knee surgeries as well.  She states that she feel that her functional level since her stroke is about at prior level.     Pertinent History  B TKR, A-fib, HTN, DM, obesity, back pain     How long can you sit comfortably?  no problem    How long can you stand comfortably?  Pt can only stand for short period of time due to her back pain     How long can you walk comfortably?  ambulates with a cane,  PT is able to walk normal distances pt is a sedentary individual     Patient Stated Goals  Here due to the  MD sending her to therapy.    Currently in Pain?  No/denies         [facility] PT Assessment - 02/05/17 0001      Assessment   Medical Diagnosis  Rt parietal CVA    Referring Provider  Dallas Breeding    Onset Date/Surgical Date  01/17/17    Next MD Visit  unknown    Prior Therapy  acute      Precautions   Precautions  None      Restrictions   Weight Bearing Restrictions  No      Balance Screen   Has the patient fallen in the past 6 months  No    Has the patient had a decrease in activity level because of a fear of falling?   No    Is the patient reluctant to leave their home because of a fear of falling?   No      Home Social worker  Private residence    Home Access  Stairs to enter    Entrance Stairs-Number of Steps  2    [place]  Two level;Laundry or work area in basement      Prior Function   Level of Independence  Independent    Vocation  Retired    Investment banker, operational   Overall Cognitive Status  Within Functional Limits for tasks assessed       Functional Tests   Functional tests  Single leg stance;Sit to Stand      Single Leg Stance   Comments  B 2 seconds each       Sit to Stand   Comments  5x 1.03      ROM / Strength   AROM / PROM / Strength  Strength      Strength   Strength Assessment Site  Hip;Knee;Ankle    Right/Left Hip  Right;Left    Right Hip Flexion  3+/5    Right Hip Extension  2-/5    Right Hip ABduction  3-/5    Left Hip Flexion  3+/5    Left Hip Extension  2-/5    Left Hip ABduction  3-/5    Right/Left Knee  Right;Left    Right Knee Flexion  4+/5    Right Knee Extension  4+/5    Left Knee Flexion  3+/5    Left Knee Extension  4+/5    Right/Left Ankle  Right;Left    Right Ankle Dorsiflexion  4+/5    Left Ankle Dorsiflexion  5/5      Ambulation/Gait   Ambulation Distance (Feet)  334 Feet    Assistive device  Straight cane    Gait Comments  3 minutes              Objective measurements completed on examination: See above findings.      [facility] Adult PT Treatment/Exercise - 02/05/17 0001      Exercises   Exercises  Knee/Hip      Knee/Hip Exercises: Standing   SLS  x5      Knee/Hip Exercises: Seated   Sit to Sand  5 reps      Knee/Hip Exercises: Supine   Bridges  5 reps    Straight Leg Raises  Both;5 reps      Knee/Hip Exercises: Sidelying   Hip ABduction  Both;5 reps             PT Education - 02/05/17 7619  Education provided  Yes    Education Details  The importance of walking; walking sheet and HEP    Person(s) Educated  Patient    Methods  Explanation;Handout    Comprehension  Verbalized understanding       PT Short Term Goals - 03/02/2017 0950      PT SHORT TERM GOAL #1   Title  PT to be completing a HEP to improve gait velocity and decrease risk of falling     Time  1    Period  Days    Status  Achieved                Plan - 03/02/2017 0946    Clinical Impression Statement  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   She is being referred to skilled physical therapy.  She feels that she is at prior functional level althought this level is below what would be expected for pt age due to other medical issues.  Evaluation demonstrates decreased activity tolerance, decreased LE and core strength B and decreased balance.  The patient was aware of all of these issue prior to her stroke.  The pt has elected to work on an individulaized home program.     History and Personal Factors relevant to plan of care:  Afib, DM, HTN, obesity, chronic back pain, TKR    Clinical Presentation  Stable    Clinical Decision Making  Low    Rehab Potential  Good    PT Frequency  1x / week    PT Duration  -- 1 week    PT Treatment/Interventions  Patient/family education    PT Next Visit Plan  Discharge to HEP        Patient will benefit from skilled therapeutic intervention in order to improve the following deficits and impairments:  Abnormal gait, Decreased activity tolerance, Decreased balance, Decreased endurance, Decreased strength, Difficulty walking, Pain  Visit Diagnosis: Muscle weakness (generalized)  Unsteadiness on feet  G-Codes - March 02, 2017 0951    Functional Assessment Tool Used (Outpatient Only)  clinical judgement:  3 minute walk test, mm testing, balance     Functional Limitation  Mobility: Walking and moving around    Mobility: Walking and Moving Around Current Status (C9470)  At least 40 percent but less than 60 percent impaired, limited or restricted    Mobility: Walking and Moving Around Goal Status [phone])  At least 40 percent but less than 60 percent impaired, limited or restricted    Mobility: Walking and Moving Around Discharge Status [phone])  At least 40 percent but less than 60 percent impaired, limited or restricted        Problem List Patient Active Problem List   Diagnosis Date Noted  . Cerebral embolism with cerebral infarction 01/18/2017  . Intracerebral hemorrhage 01/18/2017  . Elevated  troponin 01/17/2017  . Obesity 06/20/2015  . Nephrolithiasis 07/09/2014  . Asthma with bronchitis 02/23/2014  . Aphasia 02/27/2013  . ACUTE BRONCHITIS 08/30/2009  . HYPERTENSION 02/22/2007  . Obstructive sleep apnea 02/22/2007  . ALLERGY 02/22/2007   Rayetta Humphrey, PT CLT [phone] 2017/03/02, 9:52 AM  [facility] [address] [place], [place], [address] Phone: [phone]   Fax:  [phone]  Name: Nicole Cabrera MRN: [id number] Date of Birth: 1945/09/16

## 2017-02-12 DIAGNOSIS — I6350 Cerebral infarction due to unspecified occlusion or stenosis of unspecified cerebral artery: Secondary | ICD-10-CM | POA: Insufficient documentation

## 2017-02-19 ENCOUNTER — Telehealth: Payer: Self-pay | Admitting: Cardiology

## 2017-02-19 MED ORDER — ATORVASTATIN CALCIUM 40 MG PO TABS: 40.0000 mg | ORAL_TABLET | Freq: Every day | ORAL | 6 refills | Status: DC

## 2017-02-19 MED ORDER — APIXABAN 5 MG PO TABS: 5.0000 mg | ORAL_TABLET | Freq: Two times a day (BID) | ORAL | 6 refills | Status: DC

## 2017-02-19 MED ORDER — POTASSIUM CHLORIDE CRYS ER 20 MEQ PO TBCR: 20.0000 meq | EXTENDED_RELEASE_TABLET | Freq: Every day | ORAL | 6 refills | Status: DC

## 2017-02-19 NOTE — Telephone Encounter (Signed)
Patient states she needs Eliquis, Lipitor and Potassium sent to [facility] in [place]. / tg

## 2017-02-19 NOTE — Telephone Encounter (Signed)
Refill complete 

## 2017-02-26 ENCOUNTER — Encounter: Payer: Self-pay | Admitting: Obstetrics & Gynecology

## 2017-02-26 ENCOUNTER — Other Ambulatory Visit: Payer: Self-pay

## 2017-02-26 ENCOUNTER — Ambulatory Visit (INDEPENDENT_AMBULATORY_CARE_PROVIDER_SITE_OTHER): Payer: 59 | Admitting: Obstetrics & Gynecology

## 2017-02-26 VITALS — BP 154/86 | HR 66 | Resp 14 | Ht 59.0 in | Wt 243.0 lb

## 2017-02-26 DIAGNOSIS — Z01419 Encounter for gynecological examination (general) (routine) without abnormal findings: Secondary | ICD-10-CM

## 2017-02-26 NOTE — Progress Notes (Signed)
71 y.o. [id number] MarriedCaucasianF here for annual exam.  Had a small stroke this fall.  Diagnosed with a fib this year.  Followed by Dr. Harl Bowie, cardiology.      Denies vaginal bleeding.  Vulvar itching resolved after changing pads.    Patient's last menstrual period was 01/22/2011.          Sexually active: No.  The current method of family planning is post menopausal status.    Exercising: No.  The patient does not participate in regular exercise at present. Smoker:  no  Health Maintenance: Pap: 11/18/15 Negative  06/29/2013 negative  History of abnormal Pap:  no MMG:  10/02/16 BIRADS 1 negative/density c Colonoscopy:  2007.  Pt knows this is overdue.  She is seeing Dr. Silverio Decamp at [place].   BMD: 2014 heel test-normal TDaP:  Unsure  Pneumonia vaccine(s):  PCP Zostavax:   Never.  Declines having this.   Hep C testing: 11/18/15 Negative Screening Labs: recent hospitalization, Hb today: same, Urine today: not collected   reports that  has never smoked. she has never used smokeless tobacco. She reports that she does not drink alcohol or use drugs.  Past Medical History:  Diagnosis Date  . Anxiety    xanax  . Arthritis    shoulders/hips - tx with OTC meds  . Breast cyst 03/1990   pt denies 12/05/13  . Bronchitis   . Cervical tab 1972  . 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 Diabetes mellitus without complication ([place])    stated not diabetic but her AIC has been high so she is monitored closely   . Endometrial polyp 04/2000  . Hemorrhoids   . Hypertension   . Kidney stones   . Obesity   . Osteopenia 05/2007  . Peripheral neuropathy   . PMB (postmenopausal bleeding) 04/2000  . Renal stone    seen on [place] 1/16 or 2/16  . SAB (spontaneous abortion) 1981  . Shortness of breath    with exertion  . Sleep apnea    CPAP  . SVD (spontaneous vaginal delivery) 04/1964, 05/1978   x3  . TIA (transient ischemic attack)     Past Surgical History:  Procedure Laterality Date   . CERVICAL DISC SURGERY  2/93  . CESAREAN SECTION  6/83   with BTL  . HYSTEROSCOPY  5/02, 01/2011  . LITHOTRIPSY  08/30/2014   with Dr. Jeffie Pollock at [facility]  . TOTAL KNEE ARTHROPLASTY Right 6/08   right  . TOTAL KNEE ARTHROPLASTY Left 4/10   left   . WRIST SURGERY Left 4/07   left    Current Outpatient Medications  Medication Sig Dispense Refill  . acetaminophen (TYLENOL) 500 MG tablet Take 500 mg by mouth every 6 (six) hours as needed for moderate pain.    Marland Kitchen albuterol (PROVENTIL HFA;VENTOLIN HFA) 108 (90 Base) MCG/ACT inhaler Inhale 2 puffs into the lungs every 6 (six) hours as needed for wheezing or shortness of breath. 1 Inhaler 6  . ALPRAZolam (XANAX) 0.5 MG tablet Take 0.25 mg by mouth daily as needed for anxiety.     Marland Kitchen apixaban (ELIQUIS) 5 MG TABS tablet Take 1 tablet (5 mg total) by mouth 2 (two) times daily. 60 tablet 6  . atorvastatin (LIPITOR) 40 MG tablet Take 1 tablet (40 mg total) by mouth daily at 6 PM. 30 tablet 6  . cholecalciferol (VITAMIN D) 1000 UNITS tablet Take 1,000 Units by mouth every morning.     Marland Kitchen losartan-hydrochlorothiazide (HYZAAR) 100-25 MG per  tablet Take 1 tablet by mouth every morning.     . metoprolol tartrate (LOPRESSOR) 25 MG tablet Take 1 tablet (25 mg total) by mouth 2 (two) times daily. 60 tablet 6  . Misc. Devices MISC by Does not apply route at bedtime. CPAP    . Multiple Vitamins-Minerals (MULTIVITAMIN WITH MINERALS) tablet Take 1 tablet by mouth every morning. Centrum Silver Womens 50+    . potassium chloride SA (K-DUR,KLOR-CON) 20 MEQ tablet Take 1 tablet (20 mEq total) by mouth daily. 30 tablet 6   No current facility-administered medications for this visit.     Family History  Problem Relation Age of Onset  . Heart disease Father   . Colon cancer Mother   . Heart disease Mother   . Breast cancer Maternal Aunt   . Colon cancer Maternal Aunt   . Colon cancer Maternal Uncle   . Ehlers-Danlos syndrome Other        neice-being tested  4/15    ROS:  Pertinent items are noted in HPI.  Otherwise, a comprehensive ROS was negative.  Exam:   BP (!) 154/86 (BP Location: Right Arm, Patient Position: Sitting, Cuff Size: Large)   Pulse 66   Resp 14   Ht 4\' 11"  (1.499 m)   Wt 243 lb (110.2 kg)   LMP 01/22/2011   BMI 49.08 kg/m     Height: 4\' 11"  (149.9 cm)  Ht Readings from Last 3 Encounters:  02/26/17 4\' 11"  (1.499 m)  01/17/17 4' 11.5" (1.511 m)  01/05/17 4' 11.5" (1.511 m)    General appearance: alert, cooperative and appears stated age Head: Normocephalic, without obvious abnormality, atraumatic Neck: no adenopathy, supple, symmetrical, trachea midline and thyroid normal to inspection and palpation Lungs: clear to auscultation bilaterally Breasts: normal appearance, no masses or tenderness Heart: regular rate and rhythm Abdomen: soft, non-tender; bowel sounds normal; no masses,  no organomegaly Extremities: extremities normal, atraumatic, no cyanosis or edema Skin: Skin color, texture, turgor normal. No rashes or lesions Lymph nodes: Cervical, supraclavicular, and axillary nodes normal. No abnormal inguinal nodes palpated Neurologic: Grossly normal   Pelvic: External genitalia:  no lesions              Urethra:  normal appearing urethra with no masses, tenderness or lesions              Bartholins and Skenes: normal                 Vagina: normal appearing vagina with normal color and discharge, no lesions              Cervix: no lesions              Pap taken: No. Bimanual Exam:  Uterus:  normal size, contour, position, consistency, mobility, non-tender              Adnexa: normal adnexa and no mass, fullness, tenderness               Rectovaginal: Confirms               Anus:  normal sphincter tone, no lesions  Chaperone was present for exam.  A:  Well Woman with normal exam PMP, no HRT Morbid obesity Hypertension Afib Diabetes Stroke   P:   Mammogram guidelines reviewed pap smear not  obtained Blood work UTD Declines Shingrix vaccination return annually or prn

## 2017-03-05 ENCOUNTER — Telehealth: Payer: Self-pay | Admitting: *Deleted

## 2017-03-05 ENCOUNTER — Encounter: Payer: Self-pay | Admitting: Gastroenterology

## 2017-03-05 ENCOUNTER — Ambulatory Visit (INDEPENDENT_AMBULATORY_CARE_PROVIDER_SITE_OTHER): Payer: 59 | Admitting: Gastroenterology

## 2017-03-05 VITALS — BP 148/80 | HR 72 | Ht 59.0 in | Wt 244.0 lb

## 2017-03-05 DIAGNOSIS — Z7901 Long term (current) use of anticoagulants: Secondary | ICD-10-CM | POA: Diagnosis not present

## 2017-03-05 DIAGNOSIS — Z800 Family history of malignant neoplasm of digestive organs: Secondary | ICD-10-CM

## 2017-03-05 NOTE — Progress Notes (Signed)
Nicole PENNINGS    [id number]    July 11, 1945  Primary Care Physician:Tapper, Zella Richer., MD  Referring Physician: [name], MD [place], [place] [address]  Chief complaint: Colorectal cancer screening  HPI: 71 year old female previously followed by Dr. Olevia Perches, last seen in September 2015 is here to reestablish care.  She has family history of colon cancer in her mother.  Last colonoscopy in October 2007 and prior to that in 1994 and 2000.  She had acute CVA in setting of new onset A. fib in October 2018 and is currently on Eliquis She has intermittent bright red blood per rectum otherwise negative for any nausea, vomiting, dysphagia, abdominal pain or melena.   Outpatient Encounter Medications as of 03/05/2017  Medication Sig  . acetaminophen (TYLENOL) 500 MG tablet Take 500 mg by mouth every 6 (six) hours as needed for moderate pain.  Marland Kitchen albuterol (PROVENTIL HFA;VENTOLIN HFA) 108 (90 Base) MCG/ACT inhaler Inhale 2 puffs into the lungs every 6 (six) hours as needed for wheezing or shortness of breath.  . ALPRAZolam (XANAX) 0.5 MG tablet Take 0.25 mg by mouth daily as needed for anxiety.   Marland Kitchen apixaban (ELIQUIS) 5 MG TABS tablet Take 1 tablet (5 mg total) by mouth 2 (two) times daily.  Marland Kitchen atorvastatin (LIPITOR) 40 MG tablet Take 1 tablet (40 mg total) by mouth daily at 6 PM.  . cholecalciferol (VITAMIN D) 1000 UNITS tablet Take 1,000 Units by mouth every morning.   Marland Kitchen losartan-hydrochlorothiazide (HYZAAR) 100-25 MG per tablet Take 1 tablet by mouth every morning.   . metoprolol tartrate (LOPRESSOR) 25 MG tablet Take 1 tablet (25 mg total) by mouth 2 (two) times daily.  . Misc. Devices MISC by Does not apply route at bedtime. CPAP  . Multiple Vitamins-Minerals (MULTIVITAMIN WITH MINERALS) tablet Take 1 tablet by mouth every morning. Centrum Silver Womens 50+  . potassium chloride SA (K-DUR,KLOR-CON) 20 MEQ tablet Take 1 tablet (20 mEq total) by mouth daily.    No facility-administered encounter medications on file as of 03/05/2017.     Allergies as of 03/05/2017 - Review Complete 03/05/2017  Allergen Reaction Noted  . Bactrim [sulfamethoxazole-trimethoprim] Other (See Comments) 02/24/2013  . Ciprofloxacin Other (See Comments) 06/29/2013  . Hydroxyzine hcl  01/22/2011  . Keflet [cephalexin] Other (See Comments) 06/29/2013  . Lisinopril Cough 02/24/2013  . Spironolactone Nausea Only 02/24/2013  . Vistaril [hydroxyzine hcl] Other (See Comments) 01/22/2011  . Adhesive [tape] Itching, Rash, and Other (See Comments) 07/15/2014  . Erythromycin Rash   . Latex Rash   . Penicillins Rash   . Tetracycline Rash     Past Medical History:  Diagnosis Date  . Anxiety    xanax  . Arthritis    shoulders/hips - tx with OTC meds  . Breast cyst 03/1990   pt denies 12/05/13  . Bronchitis   . Cervical tab 1972  . 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 Diabetes mellitus without complication ([place])    stated not diabetic but her AIC has been high so she is monitored closely   . Endometrial polyp 04/2000  . Hemorrhoids   . Hypertension   . Kidney stones   . Obesity   . Osteopenia 05/2007  . Peripheral neuropathy   . PMB (postmenopausal bleeding) 04/2000  . SAB (spontaneous abortion) 1981  . Sleep apnea    CPAP  . SVD (spontaneous vaginal delivery) 04/1964, 05/1978   x3  .  TIA (transient ischemic attack)     Past Surgical History:  Procedure Laterality Date  . CERVICAL DISC SURGERY  2/93  . CESAREAN SECTION  6/83   with BTL  . HYSTEROSCOPY  5/02, 01/2011  . LITHOTRIPSY  08/30/2014   with Dr. Jeffie Pollock at [facility]  . TOTAL KNEE ARTHROPLASTY Right 6/08   right  . TOTAL KNEE ARTHROPLASTY Left 4/10   left   . WRIST SURGERY Left 4/07   left    Family History  Problem Relation Age of Onset  . Heart disease Father   . Colon cancer Mother   . Heart disease Mother   . Breast cancer Maternal Aunt   . Colon cancer Maternal Aunt   .  Colon cancer Maternal Uncle   . Ehlers-Danlos syndrome Other        neice-being tested 4/15    Social History   Socioeconomic History  . Marital status: Married    Spouse name: [name]  . Number of children: 3  . Years of education: college  . Highest education level: Not on file  Social Needs  . Financial resource strain: Not on file  . Food insecurity - worry: Not on file  . Food insecurity - inability: Not on file  . Transportation needs - medical: Not on file  . Transportation needs - non-medical: Not on file  Occupational History  . 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 Smoking status: Never Smoker  . Smokeless tobacco: Never Used  Substance and Sexual Activity  . Alcohol use: No    Alcohol/week: 0.0 oz    Comment: maybe once a month  . Drug use: No  . Sexual activity: No    Partners: Male    Birth control/protection: Surgical    Comment: BTL  Other Topics Concern  . Not on file  Social History Narrative   Patient is married [name])   Patient has three children.   Patient is a homemaker.   Education- College   Right handed.   Caffeine- one coke cola             Review of systems: Review of Systems  Constitutional: Negative for fever and chills.  HENT: Negative.   Eyes: Negative for blurred vision.  Respiratory: Negative for cough, shortness of breath and wheezing.   Cardiovascular: Negative for chest pain and palpitations.  Gastrointestinal: as per HPI Genitourinary: Negative for dysuria, urgency, frequency and hematuria.  Musculoskeletal: Negative for myalgias, back pain and joint pain.  Skin: Negative for itching and rash.  Neurological: Negative for dizziness, tremors, focal weakness, seizures and loss of consciousness.  Endo/Heme/Allergies: Positive for seasonal allergies.  Psychiatric/Behavioral: Negative for depression, suicidal ideas and hallucinations.  All other systems reviewed and are  negative.   Physical Exam: Vitals:   03/05/17 1442  BP: (!) 148/80  Pulse: 72   Body mass index is 49.28 kg/m. Gen:      No acute distress, walks with cane HEENT:  EOMI, sclera anicteric Neck:     No masses; no thyromegaly Lungs:    Clear to auscultation bilaterally; normal respiratory effort CV:         Regular rate and rhythm; no murmurs Abd:      + bowel sounds; soft, non-tender; no palpable masses, no distension Ext:    No edema; adequate peripheral perfusion Skin:      Warm and dry; no rash Neuro: alert and oriented x  3 Psych: normal mood and affect  Data Reviewed:  Reviewed labs, radiology imaging, old records and pertinent past GI work up   Assessment and Plan/Recommendations:  71 year old female with history of hypertension, A. fib, recent CVA October 2018, family history of colon cancer here to discuss colorectal cancer screening Patient has been having intermittent bright red blood per rectum She is past due for screening colonoscopy We will discuss with cardiology if okay to hold Eliquis 2 days prior to the procedure The risks and benefits as well as alternatives of endoscopic procedure(s) have been discussed and reviewed. All questions answered. The patient agrees to proceed.    Damaris Hippo , MD [phone] Mon-Fri 8a-5p [phone] after 5p, weekends, holidays  CC: [name], MD

## 2017-03-05 NOTE — Telephone Encounter (Signed)
  03/05/2017   RE: Nicole Cabrera DOB: [birth year] MRN: [id number]   Dear  Dr Harl Bowie,    We have scheduled the above patient for an endoscopic procedure. Our records show that she is on anticoagulation therapy.   Please advise as to how long the patient may come off her therapy of Eliquis prior to the procedure, which is scheduled for 03/15/2017.  Please fax back/ or route the completed form to [place] at [phone].   Sincerely,    Genella Mech ,CMA AAMA

## 2017-03-05 NOTE — Patient Instructions (Signed)
You have been scheduled for a colonoscopy. Please follow written instructions given to you at your visit today.  Please pick up your prep supplies at the pharmacy within the next 1-3 days. If you use inhalers (even only as needed), please bring them with you on the day of your procedure. Your physician has requested that you go to www.startemmi.com and enter the access code given to you at your visit today. This web site gives a general overview about your procedure. However, you should still follow specific instructions given to you by our office regarding your preparation for the procedure.  You will be contacted by our office prior to your procedure for directions on holding your Eliquis.  If you do not hear from our office 1 week prior to your scheduled procedure, please call [phone] to discuss.

## 2017-03-08 NOTE — Telephone Encounter (Signed)
Pt takes Eliquis for afib with CHADS2VASc score of 5 (age, sex, HTN, stroke). Renal function is normal. Given hx of stroke, recommend only holding Eliquis for 24 hours prior to procedure and resuming as soon as possible after. Clearance routed to Genella Mech, CMA.

## 2017-03-09 NOTE — Telephone Encounter (Signed)
Pt informed ok to hold eliquis 24 hours before procedure

## 2017-03-10 ENCOUNTER — Other Ambulatory Visit: Payer: Self-pay | Admitting: Internal Medicine

## 2017-03-15 ENCOUNTER — Encounter: Payer: Self-pay | Admitting: Gastroenterology

## 2017-03-15 ENCOUNTER — Ambulatory Visit (AMBULATORY_SURGERY_CENTER): Payer: 59 | Admitting: Gastroenterology

## 2017-03-15 VITALS — BP 144/80 | HR 67 | Temp 98.4°F | Resp 18 | Wt 244.0 lb

## 2017-03-15 DIAGNOSIS — D128 Benign neoplasm of rectum: Secondary | ICD-10-CM

## 2017-03-15 DIAGNOSIS — D129 Benign neoplasm of anus and anal canal: Secondary | ICD-10-CM

## 2017-03-15 DIAGNOSIS — Z1211 Encounter for screening for malignant neoplasm of colon: Secondary | ICD-10-CM

## 2017-03-15 DIAGNOSIS — D122 Benign neoplasm of ascending colon: Secondary | ICD-10-CM

## 2017-03-15 DIAGNOSIS — D123 Benign neoplasm of transverse colon: Secondary | ICD-10-CM

## 2017-03-15 DIAGNOSIS — K621 Rectal polyp: Secondary | ICD-10-CM | POA: Diagnosis not present

## 2017-03-15 MED ORDER — SODIUM CHLORIDE 0.9 % IV SOLN: 500.0000 mL | INTRAVENOUS | Status: DC

## 2017-03-15 NOTE — Progress Notes (Signed)
Called to room to assist during endoscopic procedure.  Patient ID and intended procedure confirmed with present staff. Received instructions for my participation in the procedure from the performing physician.  

## 2017-03-15 NOTE — Patient Instructions (Signed)
**Handouts given to patient on polyps, tics, and hems**  YOU HAD AN ENDOSCOPIC PROCEDURE TODAY AT [facility]:   Refer to the procedure report that was given to you for any specific questions about what was found during the examination.  If the procedure report does not answer your questions, please call your gastroenterologist to clarify.  If you requested that your care partner not be given the details of your procedure findings, then the procedure report has been included in a sealed envelope for you to review at your convenience later.  YOU SHOULD EXPECT: Some feelings of bloating in the abdomen. Passage of more gas than usual.  Walking can help get rid of the air that was put into your GI tract during the procedure and reduce the bloating. If you had a lower endoscopy (such as a colonoscopy or flexible sigmoidoscopy) you may notice spotting of blood in your stool or on the toilet paper. If you underwent a bowel prep for your procedure, you may not have a normal bowel movement for a few days.  Please Note:  You might notice some irritation and congestion in your nose or some drainage.  This is from the oxygen used during your procedure.  There is no need for concern and it should clear up in a day or so.  SYMPTOMS TO REPORT IMMEDIATELY:   Following lower endoscopy (colonoscopy or flexible sigmoidoscopy):  Excessive amounts of blood in the stool  Significant tenderness or worsening of abdominal pains  Swelling of the abdomen that is new, acute  Fever of 100F or higher   Following upper endoscopy (EGD)  Vomiting of blood or coffee ground material  New chest pain or pain under the shoulder blades  Painful or persistently difficult swallowing  New shortness of breath  Fever of 100F or higher  Black, tarry-looking stools  For urgent or emergent issues, a gastroenterologist can be reached at any hour by calling [phone].   DIET:  We do recommend a small meal at  first, but then you may proceed to your regular diet.  Drink plenty of fluids but you should avoid alcoholic beverages for 24 hours.  ACTIVITY:  You should plan to take it easy for the rest of today and you should NOT DRIVE or use heavy machinery until tomorrow (because of the sedation medicines used during the test).    FOLLOW UP: Our staff will call the number listed on your records the next business day following your procedure to check on you and address any questions or concerns that you may have regarding the information given to you following your procedure. If we do not reach you, we will leave a message.  However, if you are feeling well and you are not experiencing any problems, there is no need to return our call.  We will assume that you have returned to your regular daily activities without incident.  If any biopsies were taken you will be contacted by phone or by letter within the next 1-3 weeks.  Please call us at [phone] if you have not heard about the biopsies in 3 weeks.    SIGNATURES/CONFIDENTIALITY: You and/or your care partner have signed paperwork which will be entered into your electronic medical record.  These signatures attest to the fact that that the information above on your After Visit Summary has been reviewed and is understood.  Full responsibility of the confidentiality of this discharge information lies with you and/or your care-partner.YOU HAD AN  ENDOSCOPIC PROCEDURE TODAY AT [facility]:   Refer to the procedure report that was given to you for any specific questions about what was found during the examination.  If the procedure report does not answer your questions, please call your gastroenterologist to clarify.  If you requested that your care partner not be given the details of your procedure findings, then the procedure report has been included in a sealed envelope for you to review at your convenience later.  YOU SHOULD EXPECT: Some  feelings of bloating in the abdomen. Passage of more gas than usual.  Walking can help get rid of the air that was put into your GI tract during the procedure and reduce the bloating. If you had a lower endoscopy (such as a colonoscopy or flexible sigmoidoscopy) you may notice spotting of blood in your stool or on the toilet paper. If you underwent a bowel prep for your procedure, you may not have a normal bowel movement for a few days.  Please Note:  You might notice some irritation and congestion in your nose or some drainage.  This is from the oxygen used during your procedure.  There is no need for concern and it should clear up in a day or so.  SYMPTOMS TO REPORT IMMEDIATELY:   Following lower endoscopy (colonoscopy or flexible sigmoidoscopy):  Excessive amounts of blood in the stool  Significant tenderness or worsening of abdominal pains  Swelling of the abdomen that is new, acute  Fever of 100F or higher  For urgent or emergent issues, a gastroenterologist can be reached at any hour by calling [phone].   DIET:  We do recommend a small meal at first, but then you may proceed to your regular diet.  Drink plenty of fluids but you should avoid alcoholic beverages for 24 hours.  ACTIVITY:  You should plan to take it easy for the rest of today and you should NOT DRIVE or use heavy machinery until tomorrow (because of the sedation medicines used during the test).    FOLLOW UP: Our staff will call the number listed on your records the next business day following your procedure to check on you and address any questions or concerns that you may have regarding the information given to you following your procedure. If we do not reach you, we will leave a message.  However, if you are feeling well and you are not experiencing any problems, there is no need to return our call.  We will assume that you have returned to your regular daily activities without incident.  If any biopsies were taken  you will be contacted by phone or by letter within the next 1-3 weeks.  Please call us at [phone] if you have not heard about the biopsies in 3 weeks.    SIGNATURES/CONFIDENTIALITY: You and/or your care partner have signed paperwork which will be entered into your electronic medical record.  These signatures attest to the fact that that the information above on your After Visit Summary has been reviewed and is understood.  Full responsibility of the confidentiality of this discharge information lies with you and/or your care-partner.

## 2017-03-15 NOTE — Op Note (Addendum)
[facility] Patient Name: Nicole Cabrera Procedure Date: 03/15/2017 11:00 AM MRN: [id number] Endoscopist: [name] , MD Age: 71 Referring MD:  Date of Birth: [birth year] Gender: Female Account #: [id number] Procedure:                Colonoscopy Indications:              Screening in patient at increased risk: Family                            history of 1st-degree relative with colorectal                            cancer Medicines:                Monitored Anesthesia Care Procedure:                Pre-Anesthesia Assessment:                           - Prior to the procedure, a History and Physical                            was performed, and patient medications and                            allergies were reviewed. The patient's tolerance of                            previous anesthesia was also reviewed. The risks                            and benefits of the procedure and the sedation                            options and risks were discussed with the patient.                            All questions were answered, and informed consent                            was obtained. Prior Anticoagulants: The patient                            last took Eliquis (apixaban) 2 days prior to the                            procedure. ASA Grade Assessment: III - A patient                            with severe systemic disease. After reviewing the                            risks and benefits, the patient was deemed in  satisfactory condition to undergo the procedure.                           After obtaining informed consent, the colonoscope                            was passed under direct vision. Throughout the                            procedure, the patient's blood pressure, pulse, and                            oxygen saturations were monitored continuously. The                            Model PCF-[id number]) scope was introduced                             through the anus and advanced to the the cecum,                            identified by appendiceal orifice and ileocecal                            valve. The colonoscopy was performed without                            difficulty. The patient tolerated the procedure                            well. The quality of the bowel preparation was                            good. The ileocecal valve, appendiceal orifice, and                            rectum were photographed. Scope In: 11:08:34 AM Scope Out: 11:28:10 AM Scope Withdrawal Time: 0 hours 15 minutes 40 seconds  Total Procedure Duration: 0 hours 19 minutes 36 seconds  Findings:                 The perianal and digital rectal examinations were                            normal.                           A 20 mm polyp was found in the ascending colon. The                            polyp was multi-lobulated and pedunculated. The                            polyp was removed with a hot snare. Resection and  retrieval were complete.                           Five sessile polyps were found in the rectum and                            transverse colon. The polyps were 1 to 2 mm in                            size. These polyps were removed with a cold biopsy                            forceps. Resection and retrieval were complete.                           A 5 mm polyp was found in the transverse colon. The                            polyp was sessile. The polyp was removed with a                            cold snare. Resection and retrieval were complete.                           A few small and large-mouthed diverticula were                            found in the sigmoid colon, descending colon,                            transverse colon and ascending colon.                           Non-bleeding internal hemorrhoids were found during                            retroflexion. The  hemorrhoids were medium-sized. Complications:            No immediate complications. Estimated Blood Loss:     Estimated blood loss was minimal. Impression:               - One 20 mm polyp in the ascending colon, removed                            with a hot snare. Resected and retrieved.                           - Five 1 to 2 mm polyps in the rectum and in the                            transverse colon, removed with a cold biopsy  forceps. Resected and retrieved.                           - One 5 mm polyp in the transverse colon, removed                            with a cold snare. Resected and retrieved.                           - Diverticulosis in the sigmoid colon, in the                            descending colon, in the transverse colon and in                            the ascending colon.                           - Non-bleeding internal hemorrhoids. Recommendation:           - Patient has a contact number available for                            emergencies. The signs and symptoms of potential                            delayed complications were discussed with the                            patient. Return to normal activities tomorrow.                            Written discharge instructions were provided to the                            patient.                           - Resume previous diet.                           - Continue present medications.                           - Await pathology results.                           - Repeat colonoscopy in 3 - 5 years for                            surveillance based on pathology results.                           - Resume Eliquis (apixaban) at prior dose in 2  days. Refer to managing physician for further                            adjustment of therapy. [name], MD 03/15/2017 11:35:08 AM This report has been signed electronically.

## 2017-03-15 NOTE — Progress Notes (Signed)
A/ox3 pleased with MAC, report to [employer]

## 2017-03-18 ENCOUNTER — Telehealth: Payer: Self-pay | Admitting: *Deleted

## 2017-03-18 ENCOUNTER — Observation Stay (HOSPITAL_COMMUNITY)
Admission: EM | Admit: 2017-03-18 | Discharge: 2017-03-19 | Disposition: A | Discharge status: Home / Self Care | Payer: 59 | Attending: Internal Medicine | Admitting: Internal Medicine

## 2017-03-18 ENCOUNTER — Telehealth: Payer: Self-pay | Admitting: Gastroenterology

## 2017-03-18 ENCOUNTER — Other Ambulatory Visit: Payer: Self-pay

## 2017-03-18 ENCOUNTER — Encounter (HOSPITAL_COMMUNITY): Payer: Self-pay | Admitting: Emergency Medicine

## 2017-03-18 DIAGNOSIS — M19011 Primary osteoarthritis, right shoulder: Secondary | ICD-10-CM | POA: Diagnosis not present

## 2017-03-18 DIAGNOSIS — F419 Anxiety disorder, unspecified: Secondary | ICD-10-CM | POA: Diagnosis not present

## 2017-03-18 DIAGNOSIS — Z7901 Long term (current) use of anticoagulants: Secondary | ICD-10-CM

## 2017-03-18 DIAGNOSIS — K921 Melena: Principal | ICD-10-CM

## 2017-03-18 DIAGNOSIS — M19012 Primary osteoarthritis, left shoulder: Secondary | ICD-10-CM | POA: Insufficient documentation

## 2017-03-18 DIAGNOSIS — R011 Cardiac murmur, unspecified: Secondary | ICD-10-CM | POA: Insufficient documentation

## 2017-03-18 DIAGNOSIS — K635 Polyp of colon: Secondary | ICD-10-CM | POA: Diagnosis not present

## 2017-03-18 DIAGNOSIS — M160 Bilateral primary osteoarthritis of hip: Secondary | ICD-10-CM | POA: Diagnosis not present

## 2017-03-18 DIAGNOSIS — K633 Ulcer of intestine: Secondary | ICD-10-CM | POA: Diagnosis not present

## 2017-03-18 DIAGNOSIS — Z91040 Latex allergy status: Secondary | ICD-10-CM | POA: Insufficient documentation

## 2017-03-18 DIAGNOSIS — Z881 Allergy status to other antibiotic agents status: Secondary | ICD-10-CM | POA: Insufficient documentation

## 2017-03-18 DIAGNOSIS — H04123 Dry eye syndrome of bilateral lacrimal glands: Secondary | ICD-10-CM | POA: Insufficient documentation

## 2017-03-18 DIAGNOSIS — D62 Acute posthemorrhagic anemia: Secondary | ICD-10-CM

## 2017-03-18 DIAGNOSIS — I482 Chronic atrial fibrillation: Secondary | ICD-10-CM | POA: Diagnosis not present

## 2017-03-18 DIAGNOSIS — I639 Cerebral infarction, unspecified: Secondary | ICD-10-CM | POA: Diagnosis present

## 2017-03-18 DIAGNOSIS — K625 Hemorrhage of anus and rectum: Secondary | ICD-10-CM

## 2017-03-18 DIAGNOSIS — Z8249 Family history of ischemic heart disease and other diseases of the circulatory system: Secondary | ICD-10-CM | POA: Insufficient documentation

## 2017-03-18 DIAGNOSIS — K922 Gastrointestinal hemorrhage, unspecified: Secondary | ICD-10-CM

## 2017-03-18 DIAGNOSIS — E1142 Type 2 diabetes mellitus with diabetic polyneuropathy: Secondary | ICD-10-CM | POA: Insufficient documentation

## 2017-03-18 DIAGNOSIS — Z8673 Personal history of transient ischemic attack (TIA), and cerebral infarction without residual deficits: Secondary | ICD-10-CM | POA: Diagnosis not present

## 2017-03-18 DIAGNOSIS — E785 Hyperlipidemia, unspecified: Secondary | ICD-10-CM | POA: Diagnosis not present

## 2017-03-18 DIAGNOSIS — Z888 Allergy status to other drugs, medicaments and biological substances status: Secondary | ICD-10-CM | POA: Insufficient documentation

## 2017-03-18 DIAGNOSIS — E876 Hypokalemia: Secondary | ICD-10-CM | POA: Diagnosis not present

## 2017-03-18 DIAGNOSIS — E669 Obesity, unspecified: Secondary | ICD-10-CM | POA: Diagnosis not present

## 2017-03-18 DIAGNOSIS — G4733 Obstructive sleep apnea (adult) (pediatric): Secondary | ICD-10-CM | POA: Diagnosis not present

## 2017-03-18 DIAGNOSIS — I10 Essential (primary) hypertension: Secondary | ICD-10-CM | POA: Diagnosis present

## 2017-03-18 DIAGNOSIS — K5730 Diverticulosis of large intestine without perforation or abscess without bleeding: Secondary | ICD-10-CM | POA: Insufficient documentation

## 2017-03-18 DIAGNOSIS — Z96653 Presence of artificial knee joint, bilateral: Secondary | ICD-10-CM | POA: Insufficient documentation

## 2017-03-18 DIAGNOSIS — Z6841 Body Mass Index (BMI) 40.0 and over, adult: Secondary | ICD-10-CM | POA: Diagnosis not present

## 2017-03-18 DIAGNOSIS — M8580 Other specified disorders of bone density and structure, unspecified site: Secondary | ICD-10-CM | POA: Diagnosis not present

## 2017-03-18 DIAGNOSIS — J45909 Unspecified asthma, uncomplicated: Secondary | ICD-10-CM | POA: Diagnosis not present

## 2017-03-18 DIAGNOSIS — I4891 Unspecified atrial fibrillation: Secondary | ICD-10-CM | POA: Diagnosis present

## 2017-03-18 DIAGNOSIS — Z880 Allergy status to penicillin: Secondary | ICD-10-CM | POA: Diagnosis not present

## 2017-03-18 DIAGNOSIS — Z882 Allergy status to sulfonamides status: Secondary | ICD-10-CM | POA: Insufficient documentation

## 2017-03-18 DIAGNOSIS — Z9989 Dependence on other enabling machines and devices: Secondary | ICD-10-CM | POA: Insufficient documentation

## 2017-03-18 LAB — COMPREHENSIVE METABOLIC PANEL
ALT: 19 U/L (ref 14–54)
AST: 24 U/L (ref 15–41)
Albumin: 3.5 g/dL (ref 3.5–5.0)
Alkaline Phosphatase: 88 U/L (ref 38–126)
Anion gap: 9 (ref 5–15)
BUN: 17 mg/dL (ref 6–20)
CO2: 27 mmol/L (ref 22–32)
Calcium: 8.8 mg/dL — ABNORMAL LOW (ref 8.9–10.3)
Chloride: 102 mmol/L (ref 101–111)
Creatinine, Ser: 1 mg/dL (ref 0.44–1.00)
GFR calc Af Amer: >60 mL/min (ref >60)
GFR calc non Af Amer: 55 mL/min — ABNORMAL LOW (ref >60)
Glucose, Bld: 119 mg/dL — ABNORMAL HIGH (ref 65–99)
Potassium: 3.4 mmol/L — ABNORMAL LOW (ref 3.5–5.1)
Sodium: 138 mmol/L (ref 135–145)
Total Bilirubin: 0.6 mg/dL (ref 0.3–1.2)
Total Protein: 6.5 g/dL (ref 6.5–8.1)

## 2017-03-18 LAB — CBC
HCT: 39.4 % (ref 36.0–46.0)
Hemoglobin: 12.5 g/dL (ref 12.0–15.0)
MCH: 27.8 pg (ref 26.0–34.0)
MCHC: 31.7 g/dL (ref 30.0–36.0)
MCV: 87.8 fL (ref 78.0–100.0)
Platelets: 240 10*3/uL (ref 150–400)
RBC: 4.49 MIL/uL (ref 3.87–5.11)
RDW: 14.6 % (ref 11.5–15.5)
WBC: 14.4 10*3/uL — ABNORMAL HIGH (ref 4.0–10.5)

## 2017-03-18 LAB — CBC WITH DIFFERENTIAL/PLATELET
Basophils Absolute: 0 10*3/uL (ref 0.0–0.1)
Basophils Relative: 0 %
Eosinophils Absolute: 0.3 10*3/uL (ref 0.0–0.7)
Eosinophils Relative: 3 %
HCT: 43.8 % (ref 36.0–46.0)
Hemoglobin: 13.9 g/dL (ref 12.0–15.0)
Lymphocytes Relative: 25 %
Lymphs Abs: 3.3 10*3/uL (ref 0.7–4.0)
MCH: 28.3 pg (ref 26.0–34.0)
MCHC: 31.7 g/dL (ref 30.0–36.0)
MCV: 89 fL (ref 78.0–100.0)
Monocytes Absolute: 0.8 10*3/uL (ref 0.1–1.0)
Monocytes Relative: 6 %
Neutro Abs: 8.5 10*3/uL — ABNORMAL HIGH (ref 1.7–7.7)
Neutrophils Relative %: 66 %
Platelets: 285 10*3/uL (ref 150–400)
RBC: 4.92 MIL/uL (ref 3.87–5.11)
RDW: 14.7 % (ref 11.5–15.5)
WBC: 13 10*3/uL — ABNORMAL HIGH (ref 4.0–10.5)

## 2017-03-18 LAB — PROTIME-INR
INR: 1.1
Prothrombin Time: 14.1 seconds (ref 11.4–15.2)

## 2017-03-18 LAB — APTT: aPTT: 34 seconds (ref 24–36)

## 2017-03-18 MED ORDER — LOSARTAN POTASSIUM-HCTZ 100-25 MG PO TABS: 1.0000 | ORAL_TABLET | Freq: Every morning | ORAL | Status: DC

## 2017-03-18 MED ORDER — ATORVASTATIN CALCIUM 40 MG PO TABS
40.0000 mg | ORAL_TABLET | Freq: Every day | ORAL | Status: DC
  Filled 2017-03-18: qty 1

## 2017-03-18 MED ORDER — ONDANSETRON HCL 4 MG/2ML IJ SOLN: 4.0000 mg | Freq: Three times a day (TID) | INTRAMUSCULAR | Status: DC | PRN

## 2017-03-18 MED ORDER — ZOLPIDEM TARTRATE 5 MG PO TABS: 5.0000 mg | ORAL_TABLET | Freq: Every evening | ORAL | Status: DC | PRN

## 2017-03-18 MED ORDER — ADULT MULTIVITAMIN W/MINERALS CH
1.0000 | ORAL_TABLET | Freq: Every morning | ORAL | Status: DC
  Administered 2017-03-19: 1 via ORAL
  Filled 2017-03-18: qty 1

## 2017-03-18 MED ORDER — GUAIFENESIN ER 600 MG PO TB12: 600.0000 mg | ORAL_TABLET | Freq: Two times a day (BID) | ORAL | Status: DC | PRN

## 2017-03-18 MED ORDER — ACETAMINOPHEN 500 MG PO TABS: 500.0000 mg | ORAL_TABLET | Freq: Four times a day (QID) | ORAL | Status: DC | PRN

## 2017-03-18 MED ORDER — PEG 3350-KCL-NABCB-NACL-NASULF 236 G PO SOLR
4000.0000 mL | Freq: Once | ORAL | Status: AC
  Administered 2017-03-19: 4000 mL via ORAL
  Filled 2017-03-18: qty 4000

## 2017-03-18 MED ORDER — METOPROLOL TARTRATE 25 MG PO TABS
25.0000 mg | ORAL_TABLET | Freq: Two times a day (BID) | ORAL | Status: DC
  Administered 2017-03-19 (×2): 25 mg via ORAL
  Filled 2017-03-18 (×2): qty 1

## 2017-03-18 MED ORDER — POLYVINYL ALCOHOL 1.4 % OP SOLN: OPHTHALMIC | Status: DC | PRN

## 2017-03-18 MED ORDER — SODIUM CHLORIDE 0.9 % IV BOLUS (SEPSIS)
1000.0000 mL | Freq: Once | INTRAVENOUS | Status: AC
  Administered 2017-03-18: 1000 mL via INTRAVENOUS

## 2017-03-18 MED ORDER — ALPRAZOLAM 0.25 MG PO TABS: 0.2500 mg | ORAL_TABLET | Freq: Every day | ORAL | Status: DC | PRN

## 2017-03-18 MED ORDER — POTASSIUM CHLORIDE 20 MEQ/15ML (10%) PO SOLN
20.0000 meq | Freq: Once | ORAL | Status: AC
  Administered 2017-03-19: 20 meq via ORAL
  Filled 2017-03-18: qty 15

## 2017-03-18 MED ORDER — MORPHINE SULFATE (PF) 4 MG/ML IV SOLN: 1.0000 mg | INTRAVENOUS | Status: DC | PRN

## 2017-03-18 MED ORDER — VITAMIN D 1000 UNITS PO TABS
1000.0000 [IU] | ORAL_TABLET | Freq: Every morning | ORAL | Status: DC
  Administered 2017-03-19: 1000 [IU] via ORAL
  Filled 2017-03-18: qty 1

## 2017-03-18 MED ORDER — SODIUM CHLORIDE 0.9 % IV SOLN
INTRAVENOUS | Status: DC
  Administered 2017-03-18 – 2017-03-19 (×2): via INTRAVENOUS

## 2017-03-18 MED ORDER — HYDRALAZINE HCL 20 MG/ML IJ SOLN: 5.0000 mg | INTRAMUSCULAR | Status: DC | PRN

## 2017-03-18 MED ORDER — ALBUTEROL SULFATE (2.5 MG/3ML) 0.083% IN NEBU: 2.5000 mg | INHALATION_SOLUTION | RESPIRATORY_TRACT | Status: DC | PRN

## 2017-03-18 NOTE — Telephone Encounter (Signed)
  Follow up Call-  Call back number 03/15/2017  Post procedure Call Back phone  # [phone]  Permission to leave phone message Yes  Some recent data might be hidden     Patient questions:  Do you have a fever, pain , or abdominal swelling? No. Pain Score  0 *  Have you tolerated food without any problems? Yes.    Have you been able to return to your normal activities? Yes.    Do you have any questions about your discharge instructions: Diet   No. Medications  No. Follow up visit  No.  Do you have questions or concerns about your Care? No.  Actions: * If pain score is 4 or above: No action needed, pain <4.

## 2017-03-18 NOTE — ED Triage Notes (Addendum)
Patient reports rectal bleeding onset 6 pm this evening with intermittent low abdominal cramping , colonoscopy with polypectomy last 03/15/17 by [facility] , advised to go to ER for evaluation . Currently taking Eliquis .

## 2017-03-18 NOTE — H&P (Signed)
History and Physical    Nicole Cabrera:[id number] DOB: April 06, 1945 DOA: 03/18/2017  Referring MD/NP/PA:   PCP: [name], MD   Patient coming from:  The patient is coming from home.  At baseline, pt is independent for most of ADL.   Chief Complaint: Rectal bleeding  HPI: Nicole Cabrera is a 71 y.o. female with medical history significant of hypertension, hyperlipidemia, diabetes mellitus, stroke, TIA, anxiety, OSA on CPAP, atrial fibrillation on Eliquis, who present with rectal bleeding.  Patient states that she underwent colonoscopy and had 7 polyps removed on 03/15/17. Today she noticed rectal bleeding. She has had 4 episode of bright red bloody stool. She also has mild lower abdominal cramps, which has resolved. Patient does not have nausea and vomiting. She had dizziness, but no chest pain or SOB. Patient does not have cough, fever, chills, symptoms of UTI or unilateral weakness. Patient has atrial fibrillation, she just restarted Eliquis today after the colonoscopy.  ED Course: pt was found to have Hemoglobin 13.9-->12.5, INR 1.01, potassium 3.4, creatinine normal, bradycardia, no tachypnea, O2 sats 91 100% on room air. Patient is admitted to telemetry bed as inpatient. GI, Dr. Havery Moros was consulted.  Review of Systems:   General: no fevers, chills, no body weight gain, has poor appetite, has fatigue HEENT: no blurry vision, hearing changes or sore throat Respiratory: no dyspnea, coughing, wheezing CV: no chest pain, no palpitations GI: no nausea, vomiting, diarrhea, constipation. Has abdominal pain and rectal bleeding. GU: no dysuria, burning on urination, increased urinary frequency, hematuria  Ext: no leg edema Neuro: no unilateral weakness, numbness, or tingling, no vision change or hearing loss. Has dizziness. Skin: no rash, no skin tear. MSK: No muscle spasm, no deformity, no limitation of range of movement in spin Heme: No easy bruising.  Travel history: No  recent long distant travel.  Allergy:  Allergies  Allergen Reactions  . Bactrim [Sulfamethoxazole-Trimethoprim] Other (See Comments)    Unknown   . Ciprofloxacin Other (See Comments)    GI upset  . Hydroxyzine Hcl     Other reaction(s): Other (See Comments) Pt becomes physchotic.  Marland Kitchen Keflet [Cephalexin] Other (See Comments)    GI upset  . Lisinopril Cough  . Spironolactone Nausea Only  . Vistaril [Hydroxyzine Hcl] Other (See Comments)    Pt becomes physchotic.  . Adhesive [Tape] Itching, Rash and Other (See Comments)    Burning  . Erythromycin Rash  . Latex Rash  . Penicillins Rash    Has patient had a PCN reaction causing immediate rash, facial/tongue/throat swelling, SOB or lightheadedness with hypotension: Yes Has patient had a PCN reaction causing severe rash involving mucus membranes or skin necrosis: No Has patient had a PCN reaction that required hospitalization No Has patient had a PCN reaction occurring within the last 10 years: No If all of the above answers are "NO", then may proceed with Cephalosporin use.   . Tetracycline Rash    Past Medical History:  Diagnosis Date  . Allergy   . Anxiety    xanax  . Arthritis    shoulders/hips - tx with OTC meds  . Breast cyst 03/1990   pt denies 12/05/13  . Bronchitis   . Cataract   . Cervical tab 1972  . 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 Diabetes mellitus without complication ([place])    stated not diabetic but her AIC has been high so she is monitored closely   . Endometrial polyp 04/2000  . Heart murmur   .  Hemorrhoids   . Hypertension   . Kidney stones   . Obesity   . Osteopenia 05/2007  . Peripheral neuropathy   . PMB (postmenopausal bleeding) 04/2000  . SAB (spontaneous abortion) 1981  . Sleep apnea    CPAP  . Stroke ([place])   . SVD (spontaneous vaginal delivery) 04/1964, 05/1978   x3  . TIA (transient ischemic attack)     Past Surgical History:  Procedure Laterality Date  . CERVICAL DISC  SURGERY  2/93  . CESAREAN SECTION  6/83   with BTL  . COLONOSCOPY W/ POLYPECTOMY    . HYSTEROSCOPY  5/02, 01/2011  . LITHOTRIPSY  08/30/2014   with Dr. Jeffie Pollock at [facility]  . TOTAL KNEE ARTHROPLASTY Right 6/08   right  . TOTAL KNEE ARTHROPLASTY Left 4/10   left   . WRIST SURGERY Left 4/07   left    Social History:  reports that  has never smoked. she has never used smokeless tobacco. She reports that she does not drink alcohol or use drugs.  Family History:  Family History  Problem Relation Age of Onset  . Heart disease Father   . Colon cancer Mother   . Heart disease Mother   . Breast cancer Maternal Aunt   . Colon cancer Maternal Aunt   . Colon cancer Maternal Uncle   . Ehlers-Danlos syndrome Other        neice-being tested 4/15     Prior to Admission medications   Medication Sig Start Date End Date Taking? Authorizing Provider  acetaminophen (TYLENOL) 500 MG tablet Take 500 mg by mouth every 6 (six) hours as needed for moderate pain.   Yes [provider]  ALPRAZolam Duanne Moron) 0.5 MG tablet Take 0.25 mg by mouth daily as needed for anxiety.  10/27/13  Yes [provider]  apixaban (ELIQUIS) 5 MG TABS tablet Take 1 tablet (5 mg total) by mouth 2 (two) times daily. 02/19/17  Yes BranchAlphonse Guild, MD  atorvastatin (LIPITOR) 40 MG tablet Take 1 tablet (40 mg total) by mouth daily at 6 PM. 02/19/17  Yes Branch, Alphonse Guild, MD  cholecalciferol (VITAMIN D) 1000 UNITS tablet Take 1,000 Units by mouth every morning.    Yes [provider]  guaiFENesin (MUCINEX) 600 MG 12 hr tablet Take 600 mg by mouth 2 (two) times daily as needed for cough.   Yes [provider]  losartan-hydrochlorothiazide (HYZAAR) 100-25 MG per tablet Take 1 tablet by mouth every morning.    Yes [provider]  metoprolol tartrate (LOPRESSOR) 25 MG tablet Take 1 tablet (25 mg total) by mouth 2 (two) times daily. 10/15/16  Yes BranchAlphonse Guild, MD  Multiple  Vitamins-Minerals (MULTIVITAMIN WITH MINERALS) tablet Take 1 tablet by mouth every morning. Centrum Silver Womens 50+   Yes [provider]  Polyethyl Glycol-Propyl Glycol (SYSTANE OP) Place 1 drop into both eyes as needed (dry eyes).   Yes [provider]  potassium chloride SA (K-DUR,KLOR-CON) 20 MEQ tablet Take 1 tablet (20 mEq total) by mouth daily. 02/19/17  Yes Arnoldo Lenis, MD  PROAIR HFA 108 [phone] Base) MCG/ACT inhaler INHALE TWO PUFFS BY MOUTH EVERY 6 HOURS AS NEEDED FOR WHEEZING AND SHORTNESS OF BREATH 03/10/17  Yes Deneise Lever, MD  Misc. Devices MISC by Does not apply route at bedtime. CPAP    [provider]    Physical Exam: Vitals:   03/18/17 2250 03/19/17 0000 03/19/17 0045 03/19/17 0605  BP: 128/60 118/60 Marland Kitchen)  129/58 132/65  Pulse: 69 81 74 70  Resp: (!) 23 20 19 20   Temp:   98 F (36.7 C) 98.6 F (37 C)  TempSrc:   Oral Oral  SpO2: 95% 92% 95% 98%  Weight:   110.3 kg (243 lb 2.7 oz)   Height:   4\' 11"  (1.499 m)    General: Not in acute distress HEENT:       Eyes: PERRL, EOMI, no scleral icterus.       ENT: No discharge from the ears and nose, no pharynx injection, no tonsillar enlargement.        Neck: No JVD, no bruit, no mass felt. Heme: No neck lymph node enlargement. Cardiac: S1/S2, RRR, No murmurs, No gallops or rubs. Respiratory: No rales, wheezing, rhonchi or rubs. GI: Soft, nondistended, mild tenderness in lower abdomen, no rebound pain, no organomegaly, BS present. GU: No hematuria Ext: No pitting leg edema bilaterally. 2+DP/PT pulse bilaterally. Musculoskeletal: No joint deformities, No joint redness or warmth, no limitation of ROM in spin. Skin: No rashes.  Neuro: Alert, oriented X3, cranial nerves II-XII grossly intact, moves all extremities normally. Psych: Patient is not psychotic, no suicidal or hemocidal ideation.  Labs on Admission: I have personally reviewed following labs and imaging studies  CBC: Recent  Labs  Lab 03/18/17 1940 03/18/17 2306 03/19/17 0350  WBC 13.0* 14.4* 11.4*  NEUTROABS 8.5*  --   --   HGB 13.9 12.5 12.1  HCT 43.8 39.4 38.1  MCV 89.0 87.8 87.6  PLT 285 240 419   Basic Metabolic Panel: Recent Labs  Lab 03/18/17 1940 03/19/17 0350  NA 138 138  K 3.4* 3.7  CL 102 107  CO2 27 24  GLUCOSE 119* 120*  BUN 17 12  CREATININE 1.00 0.64  CALCIUM 8.8* 8.2*   GFR: Estimated Creatinine Clearance: 70.2 mL/min (by C-G formula based on SCr of 0.64 mg/dL). Liver Function Tests: Recent Labs  Lab 03/18/17 1940  AST 24  ALT 19  ALKPHOS 88  BILITOT 0.6  PROT 6.5  ALBUMIN 3.5   No results for input(s): LIPASE, AMYLASE in the last 168 hours. No results for input(s): AMMONIA in the last 168 hours. Coagulation Profile: Recent Labs  Lab 03/18/17 1940  INR 1.10   Cardiac Enzymes: No results for input(s): CKTOTAL, CKMB, CKMBINDEX, TROPONINI in the last 168 hours. BNP (last 3 results) No results for input(s): PROBNP in the last 8760 hours. HbA1C: No results for input(s): HGBA1C in the last 72 hours. CBG: No results for input(s): GLUCAP in the last 168 hours. Lipid Profile: No results for input(s): CHOL, HDL, LDLCALC, TRIG, CHOLHDL, LDLDIRECT in the last 72 hours. Thyroid Function Tests: No results for input(s): TSH, T4TOTAL, FREET4, T3FREE, THYROIDAB in the last 72 hours. Anemia Panel: No results for input(s): VITAMINB12, FOLATE, FERRITIN, TIBC, IRON, RETICCTPCT in the last 72 hours. Urine analysis:    Component Value Date/Time   COLORURINE YELLOW 01/17/2017 1723   APPEARANCEUR HAZY (A) 01/17/2017 1723   LABSPEC 1.017 01/17/2017 1723   PHURINE 6.0 01/17/2017 1723   GLUCOSEU NEGATIVE 01/17/2017 1723   HGBUR NEGATIVE 01/17/2017 1723   BILIRUBINUR NEGATIVE 01/17/2017 1723   BILIRUBINUR n 11/18/2015 1325   KETONESUR NEGATIVE 01/17/2017 1723   PROTEINUR NEGATIVE 01/17/2017 1723   UROBILINOGEN negative 11/18/2015 1325   UROBILINOGEN 0.2 09/02/2014 1847    NITRITE NEGATIVE 01/17/2017 1723   LEUKOCYTESUR NEGATIVE 01/17/2017 1723   Sepsis Labs: @LABRCNTIP (procalcitonin:4,lacticidven:4) )No results found for this or any previous visit (  from the past 240 hour(s)).   Radiological Exams on Admission: No results found.   EKG: Independently reviewed.  Sinus rhythm, QTC 479, LAD, low voltage.   Assessment/Plan Principal Problem:   Rectal bleeding Active Problems:   Essential hypertension   Asthma with bronchitis   Obesity   Stroke (cerebrum) (HCC)   Atrial fibrillation (HCC)   Hypokalemia   HLD (hyperlipidemia)   Rectal bleeding: likely related to recent polypectomy. Hemoglobin is 13.9-->12.5. Hemodynamically stable currently. Dr. Havery Moros of GI was consulted--> recommended to do bowel preparation for colonoscopy tomorrow   - Admit to telemetry bed as inpatient - GI consulted by Ed, will follow up recommendations - NPO  - IVF: 1L NS bolus, then at 125 mL/hr - Zofran IV for nausea - Avoid NSAIDs and SQ heparin - Maintain IV access (2 large bore IVs if possible). - Monitor closely and follow q6h cbc, transfuse as necessary, if Hgb<7.0 - LaB: INR, PTT and type screen - bowel preparation going on  HTN:  -Continue home medications: Metoprolol -Hold Hyzaar due to soft blood pressure -IV hydralazine prn  Asthma with bronchitis: stable -prn albuterol nebs  Atrial Fibrillation: CHA2DS2-VASc Score is 6, needs oral anticoagulation. Patient is on Eliquis at home. Heart rate is well controlled. Now has GBI -hold Eliquis -continue metoprolol  Stroke and HLD -on lipiotor  Hypokalemia: K= 3.4 on admission. - Repleted   DVT ppx: SCD Code Status: Full code Family Communication:  Yes, patient's husband at bed side Disposition Plan:  Anticipate discharge back to previous home environment Consults called:  GI, Dr. Havery Moros Admission status:  Inpatient/tele        Date of Service 03/19/2017    [place]  [facility] Pager [phone]  If 7PM-7AM, please contact night-coverage [email or web address] Password [email or web address] 03/19/2017, 6:09 AM

## 2017-03-18 NOTE — Telephone Encounter (Signed)
Got called from the ER. Patient had additional episodes of passing red blood per rectum and went to ED. Hgb is stable but with multiple episodes of bleeding tonight she is being admitted. Bowel prep this evening, possible colonoscopy tomorrow

## 2017-03-18 NOTE — ED Notes (Signed)
ED Provider at bedside. 

## 2017-03-18 NOTE — ED Notes (Signed)
Admitting at bedside 

## 2017-03-18 NOTE — Telephone Encounter (Signed)
The patient called this evening. She had a colonoscopy with Dr. Silverio Decamp on 12/24 with multiple polyps removed, the largest was 1-2cm in the right colon removed via hot snare. She resumed her Eliquis today at 8 AM. She states she just had a bowel movement mixed with some bright red blood, hard to quantify for her how much blood she passed, but this is new for her. No abdominal pain. No lightheadedness or dizziness, she otherwise feels well without complaints. I instructed her to hold the Eliquis (she was due for another dose tonight), stay on clear liquid diet, and go to the ER for further evaluation tonight if she has any further bleeding, lightheaded / dizziness, etc. The ER should contact me for further recommendations if she goes there. Otherwise I told her she will be contacted for reassessment tomorrow morning by our office if does not go to the hospital tonight. She will hold Eliquis until she hears from [place]. She verbalized understanding and should have low threshold for going to the ER tonight   [name], [place]

## 2017-03-18 NOTE — ED Provider Notes (Signed)
[facility] EMERGENCY DEPARTMENT Provider Note   CSN: [id number] Arrival date & time: 03/18/17  1922     History   Chief Complaint Chief Complaint  Patient presents with  . Rectal Bleeding    Colonoscopy 03/15/17    HPI Nicole Cabrera is a 71 y.o. female.  Pt presents to the ED today with rectal bleeding.  Pt has a hx of a.fib for which she is on Eliquis.  The pt's Eliquis was held for 2 days prior to a colonoscopy on 12/24 by Dr. Silverio Decamp with [facility].  She had multiple polyps removed during the procedure.  She restarted the Eliquis this morning.  She started bleeding this evening around 1800.  Pt did call the on call physician at [facility] (Dr. Havery Moros) who told her to hold the evening Eliquis and come to the ED if bleeding worsens.  The pt has had a few more episodes of bleeding since then, so she came in.  CHA2DS2/VAS Stroke Risk Points      6 >= 2 Points: High Risk  1 - 1.99 Points: Medium Risk  0 Points: Low Risk    The previous score was 5 on 01/17/2017.:  Change:     Details    This score determines the patient's risk of having a stroke if the  patient has atrial fibrillation.       Points Metrics  0 Has Congestive Heart Failure:  No   1 Has Vascular Disease:  Yes   1 Has Hypertension:  Yes   1 Age:  26   0 Has Diabetes:  No   2 Had Stroke:  Yes  Had TIA:  Yes  Had thromboembolism:  No   1 Female:  Yes                Past Medical History:  Diagnosis Date  . Allergy   . Anxiety    xanax  . Arthritis    shoulders/hips - tx with OTC meds  . Breast cyst 03/1990   pt denies 12/05/13  . Bronchitis   . Cataract   . Cervical tab 1972  . 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 Diabetes mellitus without complication ([place])    stated not diabetic but her AIC has been high so she is monitored closely   . Endometrial polyp 04/2000  . Heart murmur   . Hemorrhoids   . Hypertension   . Kidney stones   . Obesity   . Osteopenia  05/2007  . Peripheral neuropathy   . PMB (postmenopausal bleeding) 04/2000  . SAB (spontaneous abortion) 1981  . Sleep apnea    CPAP  . Stroke ([place])   . SVD (spontaneous vaginal delivery) 04/1964, 05/1978   x3  . TIA (transient ischemic attack)     Patient Active Problem List   Diagnosis Date Noted  . 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 Cerebral embolism with cerebral infarction 01/18/2017  . Intracerebral hemorrhage 01/18/2017  . Elevated troponin 01/17/2017  . Atrial fibrillation ([place]) 10/27/2016  . Obesity 06/20/2015  . Nephrolithiasis 07/09/2014  . Asthma with bronchitis 02/23/2014  . Aphasia 02/27/2013  . ACUTE BRONCHITIS 08/30/2009  . Essential hypertension 02/22/2007  . Obstructive sleep apnea 02/22/2007  . ALLERGY 02/22/2007    Past Surgical History:  Procedure Laterality Date  . CERVICAL DISC SURGERY  2/93  . CESAREAN SECTION  6/83   with BTL  . COLONOSCOPY W/ POLYPECTOMY    . HYSTEROSCOPY  5/02, 01/2011  .  LITHOTRIPSY  08/30/2014   with Dr. Jeffie Pollock at [facility]  . TOTAL KNEE ARTHROPLASTY Right 6/08   right  . TOTAL KNEE ARTHROPLASTY Left 4/10   left   . WRIST SURGERY Left 4/07   left    OB History    Gravida Para Term Preterm AB Living   5 3     2 3    SAB TAB Ectopic Multiple Live Births   1 1             Home Medications    Prior to Admission medications   Medication Sig Start Date End Date Taking? Authorizing Provider  acetaminophen (TYLENOL) 500 MG tablet Take 500 mg by mouth every 6 (six) hours as needed for moderate pain.   Yes [provider]  ALPRAZolam Duanne Moron) 0.5 MG tablet Take 0.25 mg by mouth daily as needed for anxiety.  10/27/13  Yes [provider]  apixaban (ELIQUIS) 5 MG TABS tablet Take 1 tablet (5 mg total) by mouth 2 (two) times daily. 02/19/17  Yes BranchAlphonse Guild, MD  atorvastatin (LIPITOR) 40 MG tablet Take 1 tablet (40 mg total) by mouth daily at 6 PM. 02/19/17  Yes Branch, Alphonse Guild, MD    cholecalciferol (VITAMIN D) 1000 UNITS tablet Take 1,000 Units by mouth every morning.    Yes [provider]  guaiFENesin (MUCINEX) 600 MG 12 hr tablet Take 600 mg by mouth 2 (two) times daily as needed for cough.   Yes [provider]  losartan-hydrochlorothiazide (HYZAAR) 100-25 MG per tablet Take 1 tablet by mouth every morning.    Yes [provider]  metoprolol tartrate (LOPRESSOR) 25 MG tablet Take 1 tablet (25 mg total) by mouth 2 (two) times daily. 10/15/16  Yes BranchAlphonse Guild, MD  Multiple Vitamins-Minerals (MULTIVITAMIN WITH MINERALS) tablet Take 1 tablet by mouth every morning. Centrum Silver Womens 50+   Yes [provider]  Polyethyl Glycol-Propyl Glycol (SYSTANE OP) Place 1 drop into both eyes as needed (dry eyes).   Yes [provider]  potassium chloride SA (K-DUR,KLOR-CON) 20 MEQ tablet Take 1 tablet (20 mEq total) by mouth daily. 02/19/17  Yes Arnoldo Lenis, MD  PROAIR HFA 108 [phone] Base) MCG/ACT inhaler INHALE TWO PUFFS BY MOUTH EVERY 6 HOURS AS NEEDED FOR WHEEZING AND SHORTNESS OF BREATH 03/10/17  Yes Deneise Lever, MD  Misc. Devices MISC by Does not apply route at bedtime. CPAP    [provider]    Family History Family History  Problem Relation Age of Onset  . Heart disease Father   . Colon cancer Mother   . Heart disease Mother   . Breast cancer Maternal Aunt   . Colon cancer Maternal Aunt   . Colon cancer Maternal Uncle   . Ehlers-Danlos syndrome Other        neice-being tested 4/15    Social History Social History   Tobacco Use  . Smoking status: Never Smoker  . Smokeless tobacco: Never Used  Substance Use Topics  . Alcohol use: No    Alcohol/week: 0.0 oz    Comment: maybe once a month  . Drug use: No     Allergies   Bactrim [sulfamethoxazole-trimethoprim]; Ciprofloxacin; Hydroxyzine hcl; Keflet [cephalexin]; Lisinopril; Spironolactone; Vistaril [hydroxyzine hcl]; Adhesive [tape];  Erythromycin; Latex; Penicillins; and Tetracycline   Review of Systems Review of Systems  Gastrointestinal: Positive for blood in stool.  All other systems reviewed and are negative.    Physical Exam Updated Vital  Signs BP 129/66   Pulse 66   Temp 97.9 F (36.6 C) (Oral)   Resp 17   Ht 4\' 11"  (1.499 m)   Wt 110.2 kg (243 lb)   LMP 01/22/2011   SpO2 100%   BMI 49.08 kg/m   Physical Exam  Constitutional: She is oriented to person, place, and time. She appears well-developed and well-nourished.  HENT:  Head: Normocephalic and atraumatic.  Right Ear: External ear normal.  Left Ear: External ear normal.  Nose: Nose normal.  Mouth/Throat: Oropharynx is clear and moist.  Eyes: Conjunctivae and EOM are normal. Pupils are equal, round, and reactive to light.  Neck: Normal range of motion. Neck supple.  Cardiovascular: Normal rate, regular rhythm, normal heart sounds and intact distal pulses.  Pulmonary/Chest: Effort normal and breath sounds normal.  Abdominal: Soft. Bowel sounds are normal.  Genitourinary:  Genitourinary Comments: Gross blood on underwear and around rectum.  Musculoskeletal: Normal range of motion.  Neurological: She is alert and oriented to person, place, and time.  Skin: Skin is warm and dry. Capillary refill takes less than 2 seconds.  Psychiatric: She has a normal mood and affect. Her behavior is normal. Judgment and thought content normal.  Nursing note and vitals reviewed.    ED Treatments / Results  Labs (all labs ordered are listed, but only abnormal results are displayed) Labs Reviewed  CBC WITH DIFFERENTIAL/PLATELET - Abnormal; Notable for the following components:      Result Value   WBC 13.0 (*)    Neutro Abs 8.5 (*)    All other components within normal limits  COMPREHENSIVE METABOLIC PANEL - Abnormal; Notable for the following components:   Potassium 3.4 (*)    Glucose, Bld 119 (*)    Calcium 8.8 (*)    GFR calc non Af Amer 55 (*)     All other components within normal limits  PROTIME-INR  SAMPLE TO BLOOD BANK    EKG  EKG Interpretation  Date/Time:  Thursday March 18 2017 20:43:37 EST Ventricular Rate:  59 PR Interval:    QRS Duration: 89 QT Interval:  443 QTC Calculation: 439 R Axis:   -152 Text Interpretation:  Sinus rhythm Prolonged PR interval Right axis deviation No significant change since last tracing Confirmed by Isla Pence [phone]) on 03/18/2017 8:52:06 PM       Radiology No results found.  Procedures Procedures (including critical care time)  Medications Ordered in ED Medications  polyethylene glycol (GoLYTELY/NuLYTELY) solution 4,000 mL (not administered)  sodium chloride 0.9 % bolus 1,000 mL (1,000 mLs Intravenous New Bag/Given 03/18/17 2129)     Initial Impression / Assessment and Plan / ED Course  I have reviewed the triage vital signs and the nursing notes.  Pertinent labs & imaging results that were available during my care of the patient were reviewed by me and considered in my medical decision making (see chart for details).  The pt was d/w Dr. Havery Moros (GI) who recommended a bowel prep tonight in case GI had to do a colonoscopy in the morning.  He said to have pt npo after bowel prep and recheck H &H in a few hours.  Hospitalist to admit.  Pt d/w Dr. Blaine Hamper (triad) for admission.     Final Clinical Impressions(s) / ED Diagnoses   Final diagnoses:  Acute GI bleeding  On apixaban therapy    ED Discharge Orders    None       Isla Pence, MD 03/18/17 2207

## 2017-03-18 NOTE — ED Notes (Signed)
Notified pharmacy for [place].

## 2017-03-19 ENCOUNTER — Other Ambulatory Visit: Payer: Self-pay

## 2017-03-19 ENCOUNTER — Inpatient Hospital Stay (HOSPITAL_COMMUNITY): Payer: 59 | Admitting: Certified Registered Nurse Anesthetist

## 2017-03-19 ENCOUNTER — Encounter (HOSPITAL_COMMUNITY): Payer: Self-pay | Admitting: *Deleted

## 2017-03-19 ENCOUNTER — Encounter (HOSPITAL_COMMUNITY)
Admission: EM | Disposition: A | Discharge status: Home / Self Care | Payer: Self-pay | Source: Home / Self Care | Attending: Emergency Medicine

## 2017-03-19 DIAGNOSIS — D62 Acute posthemorrhagic anemia: Secondary | ICD-10-CM | POA: Diagnosis not present

## 2017-03-19 DIAGNOSIS — Z7901 Long term (current) use of anticoagulants: Secondary | ICD-10-CM

## 2017-03-19 DIAGNOSIS — K625 Hemorrhage of anus and rectum: Secondary | ICD-10-CM | POA: Diagnosis not present

## 2017-03-19 DIAGNOSIS — E785 Hyperlipidemia, unspecified: Secondary | ICD-10-CM | POA: Diagnosis present

## 2017-03-19 DIAGNOSIS — K5730 Diverticulosis of large intestine without perforation or abscess without bleeding: Secondary | ICD-10-CM

## 2017-03-19 DIAGNOSIS — E876 Hypokalemia: Secondary | ICD-10-CM | POA: Diagnosis present

## 2017-03-19 DIAGNOSIS — K633 Ulcer of intestine: Secondary | ICD-10-CM

## 2017-03-19 DIAGNOSIS — K921 Melena: Secondary | ICD-10-CM

## 2017-03-19 DIAGNOSIS — K922 Gastrointestinal hemorrhage, unspecified: Secondary | ICD-10-CM | POA: Diagnosis not present

## 2017-03-19 DIAGNOSIS — D120 Benign neoplasm of cecum: Secondary | ICD-10-CM

## 2017-03-19 HISTORY — PX: COLONOSCOPY: SHX5424

## 2017-03-19 LAB — CBC
HCT: 38.1 % (ref 36.0–46.0)
Hemoglobin: 12.1 g/dL (ref 12.0–15.0)
MCH: 27.8 pg (ref 26.0–34.0)
MCHC: 31.8 g/dL (ref 30.0–36.0)
MCV: 87.6 fL (ref 78.0–100.0)
Platelets: 241 10*3/uL (ref 150–400)
RBC: 4.35 MIL/uL (ref 3.87–5.11)
RDW: 14.6 % (ref 11.5–15.5)
WBC: 11.4 10*3/uL — ABNORMAL HIGH (ref 4.0–10.5)

## 2017-03-19 LAB — GLUCOSE, CAPILLARY
Glucose-Capillary: 97 mg/dL (ref 65–99)
Glucose-Capillary: 95 mg/dL (ref 65–99)

## 2017-03-19 LAB — SAMPLE TO BLOOD BANK

## 2017-03-19 LAB — BASIC METABOLIC PANEL
Anion gap: 7 (ref 5–15)
BUN: 12 mg/dL (ref 6–20)
CO2: 24 mmol/L (ref 22–32)
Calcium: 8.2 mg/dL — ABNORMAL LOW (ref 8.9–10.3)
Chloride: 107 mmol/L (ref 101–111)
Creatinine, Ser: 0.64 mg/dL (ref 0.44–1.00)
GFR calc Af Amer: >60 mL/min (ref >60)
GFR calc non Af Amer: >60 mL/min (ref >60)
Glucose, Bld: 120 mg/dL — ABNORMAL HIGH (ref 65–99)
Potassium: 3.7 mmol/L (ref 3.5–5.1)
Sodium: 138 mmol/L (ref 135–145)

## 2017-03-19 SURGERY — COLONOSCOPY
Anesthesia: Monitor Anesthesia Care

## 2017-03-19 MED ORDER — HYDROCHLOROTHIAZIDE 25 MG PO TABS
25.0000 mg | ORAL_TABLET | Freq: Every day | ORAL | Status: DC
  Administered 2017-03-19: 25 mg via ORAL
  Filled 2017-03-19: qty 1

## 2017-03-19 MED ORDER — ONDANSETRON HCL 4 MG/2ML IJ SOLN
INTRAMUSCULAR | Status: DC | PRN
  Administered 2017-03-19: 4 mg via INTRAVENOUS

## 2017-03-19 MED ORDER — PROPOFOL 500 MG/50ML IV EMUL
INTRAVENOUS | Status: DC | PRN
  Administered 2017-03-19: 100 ug/kg/min via INTRAVENOUS

## 2017-03-19 MED ORDER — LIDOCAINE 2% (20 MG/ML) 5 ML SYRINGE
INTRAMUSCULAR | Status: DC | PRN
  Administered 2017-03-19: 60 mg via INTRAVENOUS
  Administered 2017-03-19: 40 mg via INTRAVENOUS

## 2017-03-19 MED ORDER — SODIUM CHLORIDE 0.9 % IV SOLN: INTRAVENOUS | Status: DC

## 2017-03-19 MED ORDER — LOSARTAN POTASSIUM 50 MG PO TABS
100.0000 mg | ORAL_TABLET | Freq: Every day | ORAL | Status: DC
  Administered 2017-03-19: 100 mg via ORAL
  Filled 2017-03-19: qty 2

## 2017-03-19 MED ORDER — PROPOFOL 10 MG/ML IV BOLUS
INTRAVENOUS | Status: DC | PRN
  Administered 2017-03-19: 20 mg via INTRAVENOUS

## 2017-03-19 NOTE — H&P (View-Only) (Signed)
Consultation  Referring Provider: Dr. Wyline Copas     Primary Care Physician:  [name], MD Primary Gastroenterologist: Dr. Silverio Decamp        Reason for Consultation: Rectal bleeding         HPI:   Nicole Cabrera is a 71 y.o. female with a past medical history as listed below including an acute CVA in the setting of new onset A. fib in October 2018 on Eliquis, and obstructive sleep apnea on CPAP, who presented to the ER on 03/18/17 with a complaint of rectal bleeding.    Per review of chart patient recently underwent colonoscopy on 03/15/17 with removal of 7 polyps.     Today, the patient describes that yesterday, late afternoon, she had a bowel movement which was full of "bright red blood", this occurred one more time before the patient called our on-call physician and was told to come to the ER.  While in the ER patient had 2 further bright red bloody bowel movements, the last around 6:30pm, and upon returning to her chair in the waiting room her husband tells me that she "passed out".  He describes her as being slumped in the chair and he had to get the nursing staff to go ahead and pull her in the back.  Patient has had no further overt bright red bloody bowel movements but did start a bowel prep last night and tells me that throughout the night she started to run "more and more clear".  Patient does describe accompanying lower abdominal cramps when her bleeding started, but now this is just a "here and there" ache which the patient tells me is likely gas or "things moving around in there".  She has remained somewhat dizzy this morning when standing.      Patient does tell me she restarted her Eliquis yesterday as instructed after time for colonoscopy.    Patient denies fever, chills, melena, vomiting, heartburn or reflux.  ED Course: pt was found to have Hemoglobin 13.9-->12.5, INR 1.01, potassium 3.4, creatinine normal, bradycardia, no tachypnea, O2 sats 91 100% on room air. Patient is  admitted to telemetry bed as inpatient.   Recent GI history: 03/15/17-colonoscopy, Dr. Silverio Decamp: Impression: one 20 mm polyp in the ascending colon removed with a hot snare, five 1-2 mm polyps in the rectum and transverse colon removed with a cold biopsy forceps, one 5 mm polyp in the transverse colon removed with a cold snare, diverticulosis in sigmoid colon and descending colon, transverse colon and ascending colon as well as nonbleeding internal hemorrhoids. Pathology: not yet available  Past Medical History:  Diagnosis Date  . Allergy   . Anxiety    xanax  . Arthritis    shoulders/hips - tx with OTC meds  . Breast cyst 03/1990   pt denies 12/05/13  . Bronchitis   . Cataract   . Cervical tab 1972  . 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 Diabetes mellitus without complication ([place])    stated not diabetic but her AIC has been high so she is monitored closely   . Endometrial polyp 04/2000  . Heart murmur   . Hemorrhoids   . Hypertension   . Kidney stones   . Obesity   . Osteopenia 05/2007  . Peripheral neuropathy   . PMB (postmenopausal bleeding) 04/2000  . SAB (spontaneous abortion) 1981  . Sleep apnea    CPAP  . Stroke ([place])   . SVD (spontaneous vaginal delivery) 04/1964, 05/1978  x3  . TIA (transient ischemic attack)     Past Surgical History:  Procedure Laterality Date  . CERVICAL DISC SURGERY  2/93  . CESAREAN SECTION  6/83   with BTL  . COLONOSCOPY W/ POLYPECTOMY    . HYSTEROSCOPY  5/02, 01/2011  . LITHOTRIPSY  08/30/2014   with Dr. Jeffie Pollock at [facility]  . TOTAL KNEE ARTHROPLASTY Right 6/08   right  . TOTAL KNEE ARTHROPLASTY Left 4/10   left   . WRIST SURGERY Left 4/07   left    Family History  Problem Relation Age of Onset  . Heart disease Father   . Colon cancer Mother   . Heart disease Mother   . Breast cancer Maternal Aunt   . Colon cancer Maternal Aunt   . Colon cancer Maternal Uncle   . Ehlers-Danlos syndrome Other        neice-being  tested 4/15     Social History   Tobacco Use  . Smoking status: Never Smoker  . Smokeless tobacco: Never Used  Substance Use Topics  . Alcohol use: No    Alcohol/week: 0.0 oz    Comment: maybe once a month  . Drug use: No    Prior to Admission medications   Medication Sig Start Date End Date Taking? Authorizing Provider  acetaminophen (TYLENOL) 500 MG tablet Take 500 mg by mouth every 6 (six) hours as needed for moderate pain.   Yes [provider]  ALPRAZolam Duanne Moron) 0.5 MG tablet Take 0.25 mg by mouth daily as needed for anxiety.  10/27/13  Yes [provider]  apixaban (ELIQUIS) 5 MG TABS tablet Take 1 tablet (5 mg total) by mouth 2 (two) times daily. 02/19/17  Yes BranchAlphonse Guild, MD  atorvastatin (LIPITOR) 40 MG tablet Take 1 tablet (40 mg total) by mouth daily at 6 PM. 02/19/17  Yes Branch, Alphonse Guild, MD  cholecalciferol (VITAMIN D) 1000 UNITS tablet Take 1,000 Units by mouth every morning.    Yes [provider]  guaiFENesin (MUCINEX) 600 MG 12 hr tablet Take 600 mg by mouth 2 (two) times daily as needed for cough.   Yes [provider]  losartan-hydrochlorothiazide (HYZAAR) 100-25 MG per tablet Take 1 tablet by mouth every morning.    Yes [provider]  metoprolol tartrate (LOPRESSOR) 25 MG tablet Take 1 tablet (25 mg total) by mouth 2 (two) times daily. 10/15/16  Yes BranchAlphonse Guild, MD  Multiple Vitamins-Minerals (MULTIVITAMIN WITH MINERALS) tablet Take 1 tablet by mouth every morning. Centrum Silver Womens 50+   Yes [provider]  Polyethyl Glycol-Propyl Glycol (SYSTANE OP) Place 1 drop into both eyes as needed (dry eyes).   Yes [provider]  potassium chloride SA (K-DUR,KLOR-CON) 20 MEQ tablet Take 1 tablet (20 mEq total) by mouth daily. 02/19/17  Yes Arnoldo Lenis, MD  PROAIR HFA 108 [phone] Base) MCG/ACT inhaler INHALE TWO PUFFS BY MOUTH EVERY 6 HOURS AS NEEDED FOR WHEEZING AND SHORTNESS OF BREATH  03/10/17  Yes Deneise Lever, MD  Misc. Devices MISC by Does not apply route at bedtime. CPAP    [provider]    Current Facility-Administered Medications  Medication Dose Route Frequency Provider Last Rate Last Dose  . 0.9 %  sodium chloride infusion   Intravenous Continuous Ivor Costa, MD 125 mL/hr at 03/19/17 0027    . acetaminophen (TYLENOL) tablet 500 mg  500 mg Oral Q6H PRN Ivor Costa, MD      .  albuterol (PROVENTIL) (2.5 MG/3ML) 0.083% nebulizer solution 2.5 mg  2.5 mg Nebulization Q4H PRN Ivor Costa, MD      . ALPRAZolam Duanne Moron) tablet 0.25 mg  0.25 mg Oral Daily PRN Ivor Costa, MD      . atorvastatin (LIPITOR) tablet 40 mg  40 mg Oral q1800 Ivor Costa, MD      . cholecalciferol (VITAMIN D) tablet 1,000 Units  1,000 Units Oral q morning - 10a Ivor Costa, MD      . guaiFENesin ([place]) 12 hr tablet 600 mg  600 mg Oral BID PRN Ivor Costa, MD      . hydrALAZINE (APRESOLINE) injection 5 mg  5 mg Intravenous Q2H PRN Ivor Costa, MD      . losartan (COZAAR) tablet 100 mg  100 mg Oral Daily Ivor Costa, MD       And  . hydrochlorothiazide (HYDRODIURIL) tablet 25 mg  25 mg Oral Daily Ivor Costa, MD      . metoprolol tartrate (LOPRESSOR) tablet 25 mg  25 mg Oral BID Ivor Costa, MD   25 mg at 03/19/17 0035  . morphine 4 MG/ML injection 1 mg  1 mg Intravenous Q4H PRN Ivor Costa, MD      . multivitamin with minerals tablet 1 tablet  1 tablet Oral q morning - 10a Ivor Costa, MD      . ondansetron [facility]) injection 4 mg  4 mg Intravenous Q8H PRN Ivor Costa, MD      . polyvinyl alcohol (LIQUIFILM TEARS) 1.4 % ophthalmic solution   Both Eyes PRN Ivor Costa, MD      . zolpidem (AMBIEN) tablet 5 mg  5 mg Oral QHS PRN Ivor Costa, MD        Allergies as of 03/18/2017 - Review Complete 03/18/2017  Allergen Reaction Noted  . Bactrim [sulfamethoxazole-trimethoprim] Other (See Comments) 02/24/2013  . Ciprofloxacin Other (See Comments) 06/29/2013  . Hydroxyzine hcl  01/22/2011  . Keflet  [cephalexin] Other (See Comments) 06/29/2013  . Lisinopril Cough 02/24/2013  . Spironolactone Nausea Only 02/24/2013  . Vistaril [hydroxyzine hcl] Other (See Comments) 01/22/2011  . Adhesive [tape] Itching, Rash, and Other (See Comments) 07/15/2014  . Erythromycin Rash   . Latex Rash   . Penicillins Rash   . Tetracycline Rash      Review of Systems:    Constitutional: No fever or chills Skin: No rash  Cardiovascular: No chest pain Respiratory: No SOB Gastrointestinal: See HPI and otherwise negative Genitourinary: No dysuria  Neurological: See HPI Musculoskeletal: No new muscle or joint pain Hematologic: No bruising Psychiatric: No history of depression or anxiety   Physical Exam:  Vital signs in last 24 hours: Temp:  [97.9 F (36.6 C)-98.6 F (37 C)] 98.6 F (37 C) (12/28 9323) Pulse Rate:  [57-81] 70 (12/28 0605) Resp:  [17-23] 20 (12/28 0605) BP: (94-182)/(51-94) 132/65 (12/28 0605) SpO2:  [91 %-100 %] 98 % (12/28 0605) Weight:  [243 lb (110.2 kg)-243 lb 2.7 oz (110.3 kg)] 243 lb 2.7 oz (110.3 kg) (12/28 0045) Last BM Date: 03/18/17 General:   Pleasant obese Caucasian female appears to be in NAD, Well developed, Well nourished, alert and cooperative Head:  Normocephalic and atraumatic. Eyes:   PEERL, EOMI. No icterus. Conjunctiva pink. Ears:  Normal auditory acuity. Neck:  Supple Throat: Oral cavity and pharynx without inflammation, swelling or lesion.  Lungs: Respirations even and unlabored. Lungs clear to auscultation bilaterally.   No wheezes, crackles, or rhonchi.  Heart: Normal S1, S2. No MRG.  Regular rate and rhythm. No peripheral edema, cyanosis or pallor.  Abdomen:  Soft, nondistended, mild generalized ttp, No rebound or guarding. Normal bowel sounds. No appreciable masses or hepatomegaly. Rectal:  Not performed.  Msk:  Symmetrical without gross deformities. Peripheral pulses intact.  Extremities:  Without edema, no deformity or joint abnormality.    Neurologic:  Alert and  oriented x4;  grossly normal neurologically.  Skin:   Dry and intact without significant lesions or rashes. Psychiatric: Demonstrates good judgement and reason without abnormal affect or behaviors.   LAB RESULTS: Recent Labs    03/18/17 1940 03/18/17 2306 03/19/17 0350  WBC 13.0* 14.4* 11.4*  HGB 13.9 12.5 12.1  HCT 43.8 39.4 38.1  PLT 285 240 241   BMET Recent Labs    03/18/17 1940 03/19/17 0350  NA 138 138  K 3.4* 3.7  CL 102 107  CO2 27 24  GLUCOSE 119* 120*  BUN 17 12  CREATININE 1.00 0.64  CALCIUM 8.8* 8.2*   LFT Recent Labs    03/18/17 1940  PROT 6.5  ALBUMIN 3.5  AST 24  ALT 19  ALKPHOS 88  BILITOT 0.6   PT/INR Recent Labs    03/18/17 1940  LABPROT 14.1  INR 1.10   PREVIOUS ENDOSCOPIES:            See HPI   Impression / Plan:   Impression: 1. Rectal Bleeding:7 polyps removed 03/15/17, pt started with brbpr yesterday x4, cleared overnight with prep, started Eliquis yesterday; Likely post-polypectomy bleed 2. ABLA: with above hgb at admission 13.0-->11.4 this morning; likely due to above 3. Chronic Anticoagulation: with Eliquis for AFib- held after colo, restarted 03/18/17, on hold again  Plan: 1.  Colonoscopy scheduled for 1030 this morning with Dr. Loletha Carrow.  Reviewed risks, benefits, limitations and alternatives and patient agrees to proceed. 2.  Patient to remain n.p.o. until after time procedure. 3.  Continue to hold Eliquis 4.  Please await further recommendations from Dr. Loletha Carrow after time of colonoscopy today.  Thank you for your kind consultation, we will continue to follow.  Lavone Nian [facility]  03/19/2017, 8:36 AM Pager #: [phone]  I have reviewed the entire case in detail with the above APP and discussed the plan in detail.  Therefore, I agree with the diagnoses recorded above. In addition,  I have personally interviewed and examined the patient and have personally reviewed any abdominal/pelvic CT scan  images.  My additional thoughts are as follows:  Post polypectomy bleed - seems to have stopped for now.  Colonoscopy to identify source and possibly clip to prevent rebleed due to need for [facility] soon.   [name] Pager [phone]  Mon-Fri 8a-5p [phone] after 5p, weekends, holidays

## 2017-03-19 NOTE — Op Note (Signed)
[facility] Patient Name: Nicole Cabrera Procedure Date : 03/19/2017 MRN: [id number] Attending MD: Estill Cotta. Loletha Carrow , MD Date of Birth: 1945/11/13 CSN: [id number] Age: 71 Admit Type: Inpatient Procedure:                Colonoscopy Indications:              Hematochezia, Acute post hemorrhagic anemia,                            post-polypectomy bleed Providers:                Mallie Mussel L. Loletha Carrow, MD, Cleda Daub, RN, Cherylynn Ridges, Technician, Edmonia James, CRNA Referring MD:              Medicines:                Monitored Anesthesia Care Complications:            No immediate complications. Estimated Blood Loss:     Estimated blood loss: none. Procedure:                Pre-Anesthesia Assessment:                           - Prior to the procedure, a History and Physical                            was performed, and patient medications and                            allergies were reviewed. The patient's tolerance of                            previous anesthesia was also reviewed. The risks                            and benefits of the procedure and the sedation                            options and risks were discussed with the patient.                            All questions were answered, and informed consent                            was obtained. Prior Anticoagulants: The patient has                            taken Eliquis (apixaban), last dose was 1 day prior                            to procedure. ASA Grade Assessment: III - A patient  with severe systemic disease. After reviewing the                            risks and benefits, the patient was deemed in                            satisfactory condition to undergo the procedure.                           After obtaining informed consent, the colonoscope                            was passed under direct vision. Throughout the   procedure, the patient's blood pressure, pulse, and                            oxygen saturations were monitored continuously. The                            EC-[id number] ([id number]) scope was introduced through                            the anus and advanced to the the cecum, identified                            by appendiceal orifice and ileocecal valve. The                            colonoscopy was performed without difficulty. The                            patient tolerated the procedure well. The quality                            of the bowel preparation was good. The ileocecal                            valve, appendiceal orifice, and rectum were                            photographed. The quality of the bowel preparation                            was evaluated using the BBPS [facility] Bowel                            Preparation Scale) with scores of: Right Colon = 2,                            Transverse Colon = 2 and Left Colon = 2. The total                            BBPS score equals 6. The bowel preparation used was  MoviPrep. Scope In: 10:48:25 AM Scope Out: 11:05:09 AM Scope Withdrawal Time: 0 hours 14 minutes 16 seconds  Total Procedure Duration: 0 hours 16 minutes 44 seconds  Findings:      The perianal and digital rectal examinations were normal.      A 3 mm polyp was found in the cecum. The polyp was sessile. It was not       removed because the patient took Eliquis 24 hrs prior.      A single (solitary) eight mm ulcer was found in the proximal ascending       colon (polypectomy site). No bleeding was present. Stigmata of recent       bleeding were present. For hemostasis, three hemostatic clips were       successfully placed (MR conditional). There was no bleeding during the       procedure.      There were multiple other small, clean-based polypectomy sites scattered       in the colon. No stigmata of bleeding seen on those, so no  intervention       taken.      A few small-mouthed diverticula were found in the sigmoid colon.      The exam was otherwise without abnormality on direct and retroflexion       views. Impression:               - One 3 mm polyp in the cecum.                           - A single (solitary) ulcer in the proximal                            ascending colon. Clips (MR conditional) were placed.                           - Diverticulosis in the sigmoid colon.                           - The examination was otherwise normal on direct                            and retroflexion views.                           - No specimens collected. Moderate Sedation:      MAC sedation used Recommendation:           - Return patient to hospital ward for possible                            discharge same day.                           - Resume regular diet.                           - Resume Eliquis (apixaban) at prior dose in 3 days.                           - Repeat colonoscopy at  appointment to be scheduled                            for polypectomy, per Dr. Silverio Decamp. Procedure Code(s):        --- Professional ---                           [phone], Colonoscopy, flexible; with control of                            bleeding, any method Diagnosis Code(s):        --- Professional ---                           D12.0, Benign neoplasm of cecum                           K63.3, Ulcer of intestine                           K92.1, Melena (includes Hematochezia)                           D62, Acute posthemorrhagic anemia                           K57.30, Diverticulosis of large intestine without                            perforation or abscess without bleeding CPT copyright 2016 American Medical Association. All rights reserved. The codes documented in this report are preliminary and upon coder review may  be revised to meet current compliance requirements. Henry L. Loletha Carrow, MD 03/19/2017 11:21:21 AM This report has  been signed electronically. Number of Addenda: 0

## 2017-03-19 NOTE — Transfer of Care (Signed)
Immediate Anesthesia Transfer of Care Note  Patient: Nicole Cabrera  Procedure(s) Performed: COLONOSCOPY (N/A )  Patient Location: Endoscopy Unit  Anesthesia Type:MAC  Level of Consciousness: awake and alert   Airway & Oxygen Therapy: Patient Spontanous Breathing  Post-op Assessment: Report given to RN and Post -op Vital signs reviewed and stable  Post vital signs: Reviewed and stable  Last Vitals:  Vitals:   03/19/17 0605 03/19/17 0943  BP: 132/65 (!) 158/56  Pulse: 70 64  Resp: 20 17  Temp: 37 C 36.7 C  SpO2: 98% 96%    Last Pain:  Vitals:   03/19/17 0943  TempSrc: Oral  PainSc:          Complications: No apparent anesthesia complications

## 2017-03-19 NOTE — Progress Notes (Signed)
Report received. Room ready.  

## 2017-03-19 NOTE — Telephone Encounter (Signed)
Thanks Richardson Landry. Patient is admitted s/p colonoscopy with stigmata of bleeding at prior polypectomy site. Called patient, unable to reach.

## 2017-03-19 NOTE — Discharge Summary (Signed)
Physician Discharge Summary  Nicole Cabrera JQB:[id number] DOB: 1945-05-10 DOA: 03/18/2017  PCP: [name], MD  Admit date: 03/18/2017 Discharge date: 03/19/2017  Admitted From: Home Disposition:  Home  Recommendations for Outpatient Follow-up:  1. Follow up with PCP in  2-3 weeks 2. Follow up with GI as scheduled  Discharge Condition:Stable CODE STATUS:Full Diet recommendation: Heart healthy   Brief/Interim Summary: Please see admit h and p for details. Briefly, 71 y.o. female with medical history significant of hypertension, hyperlipidemia, diabetes mellitus, stroke, TIA, anxiety, OSA on CPAP, atrial fibrillation on Eliquis, who present with rectal bleeding.  Rectal bleeding: likely related to recent polypectomy. Presenting hemoglobin is 13.9-->12.5. GI consulted and patient underwent colonoscopy with findings of ulcer located near recent polypectomy. Site was clipped and patient remained stable. Patient stable for discharge today from GI standpoint.  HTN:  -Continue home medications: Metoprolol -Resume home meds on discharge -Stable  Asthma with bronchitis: stable -prn albuterol nebs as needed  Atrial Fibrillation: CHA2DS2-VASc Score is 6, needs oral anticoagulation. Patient is on Eliquis at home. Heart rate is well controlled. Now has GBI -held Eliquis initially on admit, to resume on discharge per GI -continued metoprolol  Stroke and HLD -on lipiotor  Hypokalemia: K= 3.4 on admission. - Repleted    Discharge Diagnoses:  Principal Problem:   Rectal bleeding Active Problems:   Essential hypertension   Asthma with bronchitis   Obesity   Stroke (cerebrum) ([place])   Atrial fibrillation (HCC)   Hypokalemia   HLD (hyperlipidemia)   Hematochezia   Acute blood loss anemia    Discharge Instructions   Allergies as of 03/19/2017      Reactions   Bactrim [sulfamethoxazole-trimethoprim] Other (See Comments)   Unknown    Ciprofloxacin Other (See  Comments)   GI upset   Hydroxyzine Hcl    Other reaction(s): Other (See Comments) Pt becomes physchotic.   Keflet [cephalexin] Other (See Comments)   GI upset   Lisinopril Cough   Spironolactone Nausea Only   Vistaril [hydroxyzine Hcl] Other (See Comments)   Pt becomes physchotic.   Adhesive [tape] Itching, Rash, Other (See Comments)   Burning   Erythromycin Rash   Latex Rash   Penicillins Rash   Has patient had a PCN reaction causing immediate rash, facial/tongue/throat swelling, SOB or lightheadedness with hypotension: Yes Has patient had a PCN reaction causing severe rash involving mucus membranes or skin necrosis: No Has patient had a PCN reaction that required hospitalization No Has patient had a PCN reaction occurring within the last 10 years: No If all of the above answers are "NO", then may proceed with Cephalosporin use.   Tetracycline Rash      Medication List    TAKE these medications   acetaminophen 500 MG tablet Commonly known as:  TYLENOL Take 500 mg by mouth every 6 (six) hours as needed for moderate pain.   ALPRAZolam 0.5 MG tablet Commonly known as:  XANAX Take 0.25 mg by mouth daily as needed for anxiety.   apixaban 5 MG Tabs tablet Commonly known as:  ELIQUIS Take 1 tablet (5 mg total) by mouth 2 (two) times daily.   atorvastatin 40 MG tablet Commonly known as:  LIPITOR Take 1 tablet (40 mg total) by mouth daily at 6 PM.   cholecalciferol 1000 units tablet Commonly known as:  VITAMIN D Take 1,000 Units by mouth every morning.   guaiFENesin 600 MG 12 hr tablet Commonly known as:  MUCINEX Take 600 mg by mouth  2 (two) times daily as needed for cough.   losartan-hydrochlorothiazide 100-25 MG tablet Commonly known as:  HYZAAR Take 1 tablet by mouth every morning.   metoprolol tartrate 25 MG tablet Commonly known as:  LOPRESSOR Take 1 tablet (25 mg total) by mouth 2 (two) times daily.   Misc. Devices Misc by Does not apply route at bedtime.  CPAP   multivitamin with minerals tablet Take 1 tablet by mouth every morning. Centrum Silver Womens 50+   potassium chloride SA 20 MEQ tablet Commonly known as:  K-DUR,KLOR-CON Take 1 tablet (20 mEq total) by mouth daily.   PROAIR HFA 108 (90 Base) MCG/ACT inhaler Generic drug:  albuterol INHALE TWO PUFFS BY MOUTH EVERY 6 HOURS AS NEEDED FOR WHEEZING AND SHORTNESS OF BREATH   SYSTANE OP Place 1 drop into both eyes as needed (dry eyes).      Follow-up Information    [name], MD. Schedule an appointment as soon as possible for a visit in 2 week(s).   Specialty:  Family Medicine Contact information: [address] [place] [address] [phone]        [name], MD. Schedule an appointment as soon as possible for a visit.   Specialty:  Gastroenterology Contact information: [place] [address] [phone]          Allergies  Allergen Reactions  . Bactrim [Sulfamethoxazole-Trimethoprim] Other (See Comments)    Unknown   . Ciprofloxacin Other (See Comments)    GI upset  . Hydroxyzine Hcl     Other reaction(s): Other (See Comments) Pt becomes physchotic.  Marland Kitchen Keflet [Cephalexin] Other (See Comments)    GI upset  . Lisinopril Cough  . Spironolactone Nausea Only  . Vistaril [Hydroxyzine Hcl] Other (See Comments)    Pt becomes physchotic.  . Adhesive [Tape] Itching, Rash and Other (See Comments)    Burning  . Erythromycin Rash  . Latex Rash  . Penicillins Rash    Has patient had a PCN reaction causing immediate rash, facial/tongue/throat swelling, SOB or lightheadedness with hypotension: Yes Has patient had a PCN reaction causing severe rash involving mucus membranes or skin necrosis: No Has patient had a PCN reaction that required hospitalization No Has patient had a PCN reaction occurring within the last 10 years: No If all of the above answers are "NO", then may proceed with Cephalosporin use.   . Tetracycline Rash     Consultations:  GI  Procedures/Studies:  No results found.  Subjective: Without complaints  Discharge Exam: Vitals:   03/19/17 1120 03/19/17 1130  BP: 125/67 138/67  Pulse: 70 62  Resp: 14 17  Temp:    SpO2: 94% 96%   Vitals:   03/19/17 0943 03/19/17 1117 03/19/17 1120 03/19/17 1130  BP: (!) 158/56 125/67 125/67 138/67  Pulse: 64 68 70 62  Resp: 17 (!) 22 14 17   Temp: 98 F (36.7 C) 97.8 F (36.6 C)    TempSrc: Oral Axillary    SpO2: 96% 96% 94% 96%  Weight:      Height:        General: Pt is alert, awake, not in acute distress Cardiovascular: RRR, S1/S2 +, no rubs, no gallops Respiratory: CTA bilaterally, no wheezing, no rhonchi Abdominal: Soft, NT, ND, bowel sounds + Extremities: no edema, no cyanosis   The results of significant diagnostics from this hospitalization (including imaging, microbiology, ancillary and laboratory) are listed below for reference.     Microbiology: No results  found for this or any previous visit (from the past 240 hour(s)).   Labs: BNP (last 3 results) No results for input(s): BNP in the last 8760 hours. Basic Metabolic Panel: Recent Labs  Lab 03/18/17 1940 03/19/17 0350  NA 138 138  K 3.4* 3.7  CL 102 107  CO2 27 24  GLUCOSE 119* 120*  BUN 17 12  CREATININE 1.00 0.64  CALCIUM 8.8* 8.2*   Liver Function Tests: Recent Labs  Lab 03/18/17 1940  AST 24  ALT 19  ALKPHOS 88  BILITOT 0.6  PROT 6.5  ALBUMIN 3.5   No results for input(s): LIPASE, AMYLASE in the last 168 hours. No results for input(s): AMMONIA in the last 168 hours. CBC: Recent Labs  Lab 03/18/17 1940 03/18/17 2306 03/19/17 0350  WBC 13.0* 14.4* 11.4*  NEUTROABS 8.5*  --   --   HGB 13.9 12.5 12.1  HCT 43.8 39.4 38.1  MCV 89.0 87.8 87.6  PLT 285 240 241   Cardiac Enzymes: No results for input(s): CKTOTAL, CKMB, CKMBINDEX, TROPONINI in the last 168 hours. BNP: Invalid input(s): POCBNP CBG: Recent Labs  Lab 03/19/17 0754  03/19/17 1207  GLUCAP 97 95   D-Dimer No results for input(s): DDIMER in the last 72 hours. Hgb A1c No results for input(s): HGBA1C in the last 72 hours. Lipid Profile No results for input(s): CHOL, HDL, LDLCALC, TRIG, CHOLHDL, LDLDIRECT in the last 72 hours. Thyroid function studies No results for input(s): TSH, T4TOTAL, T3FREE, THYROIDAB in the last 72 hours.  Invalid input(s): FREET3 Anemia work up No results for input(s): VITAMINB12, FOLATE, FERRITIN, TIBC, IRON, RETICCTPCT in the last 72 hours. Urinalysis    Component Value Date/Time   COLORURINE YELLOW 01/17/2017 1723   APPEARANCEUR HAZY (A) 01/17/2017 1723   LABSPEC 1.017 01/17/2017 1723   PHURINE 6.0 01/17/2017 1723   GLUCOSEU NEGATIVE 01/17/2017 1723   HGBUR NEGATIVE 01/17/2017 1723   BILIRUBINUR NEGATIVE 01/17/2017 1723   BILIRUBINUR n 11/18/2015 1325   KETONESUR NEGATIVE 01/17/2017 1723   PROTEINUR NEGATIVE 01/17/2017 1723   UROBILINOGEN negative 11/18/2015 1325   UROBILINOGEN 0.2 09/02/2014 1847   NITRITE NEGATIVE 01/17/2017 1723   LEUKOCYTESUR NEGATIVE 01/17/2017 1723   Sepsis Labs Invalid input(s): PROCALCITONIN,  WBC,  LACTICIDVEN Microbiology No results found for this or any previous visit (from the past 240 hour(s)).   SIGNED:   Marylu Lund, MD  [facility] 03/19/2017, 12:45 PM  If 7PM-7AM, please contact night-coverage [email or web address] Password [email or web address]

## 2017-03-19 NOTE — Consult Note (Signed)
Consultation  Referring Provider: Dr. Wyline Copas     Primary Care Physician:  [name], MD Primary Gastroenterologist: Dr. Silverio Decamp        Reason for Consultation: Rectal bleeding         HPI:   Nicole Cabrera is a 71 y.o. female with a past medical history as listed below including an acute CVA in the setting of new onset A. fib in October 2018 on Eliquis, and obstructive sleep apnea on CPAP, who presented to the ER on 03/18/17 with a complaint of rectal bleeding.    Per review of chart patient recently underwent colonoscopy on 03/15/17 with removal of 7 polyps.     Today, the patient describes that yesterday, late afternoon, she had a bowel movement which was full of "bright red blood", this occurred one more time before the patient called our on-call physician and was told to come to the ER.  While in the ER patient had 2 further bright red bloody bowel movements, the last around 6:30pm, and upon returning to her chair in the waiting room her husband tells me that she "passed out".  He describes her as being slumped in the chair and he had to get the nursing staff to go ahead and pull her in the back.  Patient has had no further overt bright red bloody bowel movements but did start a bowel prep last night and tells me that throughout the night she started to run "more and more clear".  Patient does describe accompanying lower abdominal cramps when her bleeding started, but now this is just a "here and there" ache which the patient tells me is likely gas or "things moving around in there".  She has remained somewhat dizzy this morning when standing.      Patient does tell me she restarted her Eliquis yesterday as instructed after time for colonoscopy.    Patient denies fever, chills, melena, vomiting, heartburn or reflux.  ED Course: pt was found to have Hemoglobin 13.9-->12.5, INR 1.01, potassium 3.4, creatinine normal, bradycardia, no tachypnea, O2 sats 91 100% on room air. Patient is  admitted to telemetry bed as inpatient.   Recent GI history: 03/15/17-colonoscopy, Dr. Silverio Decamp: Impression: one 20 mm polyp in the ascending colon removed with a hot snare, five 1-2 mm polyps in the rectum and transverse colon removed with a cold biopsy forceps, one 5 mm polyp in the transverse colon removed with a cold snare, diverticulosis in sigmoid colon and descending colon, transverse colon and ascending colon as well as nonbleeding internal hemorrhoids. Pathology: not yet available  Past Medical History:  Diagnosis Date  . Allergy   . Anxiety    xanax  . Arthritis    shoulders/hips - tx with OTC meds  . Breast cyst 03/1990   pt denies 12/05/13  . Bronchitis   . Cataract   . Cervical tab 1972  . 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 Diabetes mellitus without complication ([place])    stated not diabetic but her AIC has been high so she is monitored closely   . Endometrial polyp 04/2000  . Heart murmur   . Hemorrhoids   . Hypertension   . Kidney stones   . Obesity   . Osteopenia 05/2007  . Peripheral neuropathy   . PMB (postmenopausal bleeding) 04/2000  . SAB (spontaneous abortion) 1981  . Sleep apnea    CPAP  . Stroke ([employer])   . SVD (spontaneous vaginal delivery) 04/1964, 05/1978  x3  . TIA (transient ischemic attack)     Past Surgical History:  Procedure Laterality Date  . CERVICAL DISC SURGERY  2/93  . CESAREAN SECTION  6/83   with BTL  . COLONOSCOPY W/ POLYPECTOMY    . HYSTEROSCOPY  5/02, 01/2011  . LITHOTRIPSY  08/30/2014   with Dr. Jeffie Pollock at [facility]  . TOTAL KNEE ARTHROPLASTY Right 6/08   right  . TOTAL KNEE ARTHROPLASTY Left 4/10   left   . WRIST SURGERY Left 4/07   left    Family History  Problem Relation Age of Onset  . Heart disease Father   . Colon cancer Mother   . Heart disease Mother   . Breast cancer Maternal Aunt   . Colon cancer Maternal Aunt   . Colon cancer Maternal Uncle   . Ehlers-Danlos syndrome Other        neice-being  tested 4/15     Social History   Tobacco Use  . Smoking status: Never Smoker  . Smokeless tobacco: Never Used  Substance Use Topics  . Alcohol use: No    Alcohol/week: 0.0 oz    Comment: maybe once a month  . Drug use: No    Prior to Admission medications   Medication Sig Start Date End Date Taking? Authorizing Provider  acetaminophen (TYLENOL) 500 MG tablet Take 500 mg by mouth every 6 (six) hours as needed for moderate pain.   Yes [provider]  ALPRAZolam Duanne Moron) 0.5 MG tablet Take 0.25 mg by mouth daily as needed for anxiety.  10/27/13  Yes [provider]  apixaban (ELIQUIS) 5 MG TABS tablet Take 1 tablet (5 mg total) by mouth 2 (two) times daily. 02/19/17  Yes BranchAlphonse Guild, MD  atorvastatin (LIPITOR) 40 MG tablet Take 1 tablet (40 mg total) by mouth daily at 6 PM. 02/19/17  Yes Branch, Alphonse Guild, MD  cholecalciferol (VITAMIN D) 1000 UNITS tablet Take 1,000 Units by mouth every morning.    Yes [provider]  guaiFENesin (MUCINEX) 600 MG 12 hr tablet Take 600 mg by mouth 2 (two) times daily as needed for cough.   Yes [provider]  losartan-hydrochlorothiazide (HYZAAR) 100-25 MG per tablet Take 1 tablet by mouth every morning.    Yes [provider]  metoprolol tartrate (LOPRESSOR) 25 MG tablet Take 1 tablet (25 mg total) by mouth 2 (two) times daily. 10/15/16  Yes BranchAlphonse Guild, MD  Multiple Vitamins-Minerals (MULTIVITAMIN WITH MINERALS) tablet Take 1 tablet by mouth every morning. Centrum Silver Womens 50+   Yes [provider]  Polyethyl Glycol-Propyl Glycol (SYSTANE OP) Place 1 drop into both eyes as needed (dry eyes).   Yes [provider]  potassium chloride SA (K-DUR,KLOR-CON) 20 MEQ tablet Take 1 tablet (20 mEq total) by mouth daily. 02/19/17  Yes Arnoldo Lenis, MD  PROAIR HFA 108 [phone] Base) MCG/ACT inhaler INHALE TWO PUFFS BY MOUTH EVERY 6 HOURS AS NEEDED FOR WHEEZING AND SHORTNESS OF BREATH  03/10/17  Yes Deneise Lever, MD  Misc. Devices MISC by Does not apply route at bedtime. CPAP    [provider]    Current Facility-Administered Medications  Medication Dose Route Frequency Provider Last Rate Last Dose  . 0.9 %  sodium chloride infusion   Intravenous Continuous Ivor Costa, MD 125 mL/hr at 03/19/17 0027    . acetaminophen (TYLENOL) tablet 500 mg  500 mg Oral Q6H PRN Ivor Costa, MD      .  albuterol (PROVENTIL) (2.5 MG/3ML) 0.083% nebulizer solution 2.5 mg  2.5 mg Nebulization Q4H PRN Ivor Costa, MD      . ALPRAZolam Duanne Moron) tablet 0.25 mg  0.25 mg Oral Daily PRN Ivor Costa, MD      . atorvastatin (LIPITOR) tablet 40 mg  40 mg Oral q1800 Ivor Costa, MD      . cholecalciferol (VITAMIN D) tablet 1,000 Units  1,000 Units Oral q morning - 10a Ivor Costa, MD      . guaiFENesin ([place]) 12 hr tablet 600 mg  600 mg Oral BID PRN Ivor Costa, MD      . hydrALAZINE (APRESOLINE) injection 5 mg  5 mg Intravenous Q2H PRN Ivor Costa, MD      . losartan (COZAAR) tablet 100 mg  100 mg Oral Daily Ivor Costa, MD       And  . hydrochlorothiazide (HYDRODIURIL) tablet 25 mg  25 mg Oral Daily Ivor Costa, MD      . metoprolol tartrate (LOPRESSOR) tablet 25 mg  25 mg Oral BID Ivor Costa, MD   25 mg at 03/19/17 0035  . morphine 4 MG/ML injection 1 mg  1 mg Intravenous Q4H PRN Ivor Costa, MD      . multivitamin with minerals tablet 1 tablet  1 tablet Oral q morning - 10a Ivor Costa, MD      . ondansetron [facility]) injection 4 mg  4 mg Intravenous Q8H PRN Ivor Costa, MD      . polyvinyl alcohol (LIQUIFILM TEARS) 1.4 % ophthalmic solution   Both Eyes PRN Ivor Costa, MD      . zolpidem (AMBIEN) tablet 5 mg  5 mg Oral QHS PRN Ivor Costa, MD        Allergies as of 03/18/2017 - Review Complete 03/18/2017  Allergen Reaction Noted  . Bactrim [sulfamethoxazole-trimethoprim] Other (See Comments) 02/24/2013  . Ciprofloxacin Other (See Comments) 06/29/2013  . Hydroxyzine hcl  01/22/2011  . Keflet  [cephalexin] Other (See Comments) 06/29/2013  . Lisinopril Cough 02/24/2013  . Spironolactone Nausea Only 02/24/2013  . Vistaril [hydroxyzine hcl] Other (See Comments) 01/22/2011  . Adhesive [tape] Itching, Rash, and Other (See Comments) 07/15/2014  . Erythromycin Rash   . Latex Rash   . Penicillins Rash   . Tetracycline Rash      Review of Systems:    Constitutional: No fever or chills Skin: No rash  Cardiovascular: No chest pain Respiratory: No SOB Gastrointestinal: See HPI and otherwise negative Genitourinary: No dysuria  Neurological: See HPI Musculoskeletal: No new muscle or joint pain Hematologic: No bruising Psychiatric: No history of depression or anxiety   Physical Exam:  Vital signs in last 24 hours: Temp:  [97.9 F (36.6 C)-98.6 F (37 C)] 98.6 F (37 C) (12/28 7673) Pulse Rate:  [57-81] 70 (12/28 0605) Resp:  [17-23] 20 (12/28 0605) BP: (94-182)/(51-94) 132/65 (12/28 0605) SpO2:  [91 %-100 %] 98 % (12/28 0605) Weight:  [243 lb (110.2 kg)-243 lb 2.7 oz (110.3 kg)] 243 lb 2.7 oz (110.3 kg) (12/28 0045) Last BM Date: 03/18/17 General:   Pleasant obese Caucasian female appears to be in NAD, Well developed, Well nourished, alert and cooperative Head:  Normocephalic and atraumatic. Eyes:   PEERL, EOMI. No icterus. Conjunctiva pink. Ears:  Normal auditory acuity. Neck:  Supple Throat: Oral cavity and pharynx without inflammation, swelling or lesion.  Lungs: Respirations even and unlabored. Lungs clear to auscultation bilaterally.   No wheezes, crackles, or rhonchi.  Heart: Normal S1, S2. No MRG.  Regular rate and rhythm. No peripheral edema, cyanosis or pallor.  Abdomen:  Soft, nondistended, mild generalized ttp, No rebound or guarding. Normal bowel sounds. No appreciable masses or hepatomegaly. Rectal:  Not performed.  Msk:  Symmetrical without gross deformities. Peripheral pulses intact.  Extremities:  Without edema, no deformity or joint abnormality.    Neurologic:  Alert and  oriented x4;  grossly normal neurologically.  Skin:   Dry and intact without significant lesions or rashes. Psychiatric: Demonstrates good judgement and reason without abnormal affect or behaviors.   LAB RESULTS: Recent Labs    03/18/17 1940 03/18/17 2306 03/19/17 0350  WBC 13.0* 14.4* 11.4*  HGB 13.9 12.5 12.1  HCT 43.8 39.4 38.1  PLT 285 240 241   BMET Recent Labs    03/18/17 1940 03/19/17 0350  NA 138 138  K 3.4* 3.7  CL 102 107  CO2 27 24  GLUCOSE 119* 120*  BUN 17 12  CREATININE 1.00 0.64  CALCIUM 8.8* 8.2*   LFT Recent Labs    03/18/17 1940  PROT 6.5  ALBUMIN 3.5  AST 24  ALT 19  ALKPHOS 88  BILITOT 0.6   PT/INR Recent Labs    03/18/17 1940  LABPROT 14.1  INR 1.10   PREVIOUS ENDOSCOPIES:            See HPI   Impression / Plan:   Impression: 1. Rectal Bleeding:7 polyps removed 03/15/17, pt started with brbpr yesterday x4, cleared overnight with prep, started Eliquis yesterday; Likely post-polypectomy bleed 2. ABLA: with above hgb at admission 13.0-->11.4 this morning; likely due to above 3. Chronic Anticoagulation: with Eliquis for AFib- held after colo, restarted 03/18/17, on hold again  Plan: 1.  Colonoscopy scheduled for 1030 this morning with Dr. Loletha Carrow.  Reviewed risks, benefits, limitations and alternatives and patient agrees to proceed. 2.  Patient to remain n.p.o. until after time procedure. 3.  Continue to hold Eliquis 4.  Please await further recommendations from Dr. Loletha Carrow after time of colonoscopy today.  Thank you for your kind consultation, we will continue to follow.  Lavone Nian [facility]  03/19/2017, 8:36 AM Pager #: [phone]  I have reviewed the entire case in detail with the above APP and discussed the plan in detail.  Therefore, I agree with the diagnoses recorded above. In addition,  I have personally interviewed and examined the patient and have personally reviewed any abdominal/pelvic CT scan  images.  My additional thoughts are as follows:  Post polypectomy bleed - seems to have stopped for now.  Colonoscopy to identify source and possibly clip to prevent rebleed due to need for [facility] soon.   [name] Pager [phone]  Mon-Fri 8a-5p [phone] after 5p, weekends, holidays

## 2017-03-19 NOTE — Interval H&P Note (Signed)
History and Physical Interval Note:  03/19/2017 10:25 AM  Nicole Cabrera  has presented today for surgery, with the diagnosis of hematochezia  The various methods of treatment have been discussed with the patient and family. After consideration of risks, benefits and other options for treatment, the patient has consented to  Procedure(s): COLONOSCOPY (N/A) as a surgical intervention .  The patient's history has been reviewed, patient examined, no change in status, stable for surgery.  I have reviewed the patient's chart and labs.  Questions were answered to the patient's satisfaction.     [name]

## 2017-03-19 NOTE — Anesthesia Procedure Notes (Signed)
Procedure Name: MAC Performed by: Valda Favia, CRNA Pre-anesthesia Checklist: Patient identified, Emergency Drugs available, Suction available, Patient being monitored and Timeout performed Patient Re-evaluated:Patient Re-evaluated prior to induction Oxygen Delivery Method: Simple face mask Preoxygenation: Pre-oxygenation with 100% oxygen Placement Confirmation: positive ETCO2 Dental Injury: Teeth and Oropharynx as per pre-operative assessment

## 2017-03-19 NOTE — Progress Notes (Signed)
Arrival Method: Patient arrived in stretcher from ED. Mental Orientation: alert and oriented Telemetry:yes Assessment: See Doc Flow sheets. Skin: Warm, dry and intact. IV: Peripheral I right forearm Pain:.denies Fall Prevention Safety Plan: Patient educated about fall prevention safety plan, understood and acknowledged. Admission Screening5100 Orientation: Patient has been oriented to the unit, staff and to the room.

## 2017-03-19 NOTE — Anesthesia Postprocedure Evaluation (Signed)
Anesthesia Post Note  Patient: Nicole Cabrera  Procedure(s) Performed: COLONOSCOPY (N/A )     Patient location during evaluation: PACU Anesthesia Type: MAC Level of consciousness: awake and alert Pain management: pain level controlled Vital Signs Assessment: post-procedure vital signs reviewed and stable Respiratory status: spontaneous breathing, nonlabored ventilation, respiratory function stable and patient connected to nasal cannula oxygen Cardiovascular status: stable and blood pressure returned to baseline Postop Assessment: no apparent nausea or vomiting Anesthetic complications: no    Last Vitals:  Vitals:   03/19/17 1120 03/19/17 1130  BP: 125/67 138/67  Pulse: 70 62  Resp: 14 17  Temp:    SpO2: 94% 96%    Last Pain:  Vitals:   03/19/17 1117  TempSrc: Axillary  PainSc:                  Karyl Kinnier Ellender

## 2017-03-19 NOTE — Anesthesia Preprocedure Evaluation (Signed)
Anesthesia Evaluation  Patient identified by MRN, date of birth, ID band Patient awake    Reviewed: Allergy & Precautions, NPO status , Patient's Chart, lab work & pertinent test results  History of Anesthesia Complications Negative for: history of anesthetic complications  Airway Mallampati: III  TM Distance: >3 FB Neck ROM: Full    Dental no notable dental hx. (+) Dental Advisory Given   Pulmonary neg pulmonary ROS,    Pulmonary exam normal        Cardiovascular hypertension, Pt. on home beta blockers Normal cardiovascular exam  Study Conclusions  - Left ventricle: The cavity size was normal. There was moderate concentric hypertrophy. Systolic function was normal. The estimated ejection fraction was in the range of 55% to 60%. Wall motion was normal; there were no regional wall motion abnormalities.      Neuro/Psych PSYCHIATRIC DISORDERS Anxiety TIA Neuromuscular disease CVA    GI/Hepatic negative GI ROS, Neg liver ROS,   Endo/Other  diabetes  Renal/GU negative Renal ROS  negative genitourinary   Musculoskeletal negative musculoskeletal ROS (+)   Abdominal   Peds negative pediatric ROS (+)  Hematology negative hematology ROS (+)   Anesthesia Other Findings   Reproductive/Obstetrics negative OB ROS                             Anesthesia Physical Anesthesia Plan  ASA: III  Anesthesia Plan: MAC   Post-op Pain Management:    Induction:   PONV Risk Score and Plan: 2 and Ondansetron and Propofol infusion  Airway Management Planned: Natural Airway and Simple Face Mask  Additional Equipment:   Intra-op Plan:   Post-operative Plan:   Informed Consent: I have reviewed the patients History and Physical, chart, labs and discussed the procedure including the risks, benefits and alternatives for the proposed anesthesia with the patient or authorized representative who has  indicated his/her understanding and acceptance.   Dental advisory given  Plan Discussed with:   Anesthesia Plan Comments:         Anesthesia Quick Evaluation

## 2017-03-19 NOTE — Progress Notes (Signed)
Nicole Cabrera to be D/C'd Home per MD order.  Discussed prescriptions and follow up appointments with the patient. Prescriptions given to patient, medication list explained in detail. Pt verbalized understanding.  Allergies as of 03/19/2017      Reactions   Bactrim [sulfamethoxazole-trimethoprim] Other (See Comments)   Unknown    Ciprofloxacin Other (See Comments)   GI upset   Hydroxyzine Hcl    Other reaction(s): Other (See Comments) Pt becomes physchotic.   Keflet [cephalexin] Other (See Comments)   GI upset   Lisinopril Cough   Spironolactone Nausea Only   Vistaril [hydroxyzine Hcl] Other (See Comments)   Pt becomes physchotic.   Adhesive [tape] Itching, Rash, Other (See Comments)   Burning   Erythromycin Rash   Latex Rash   Penicillins Rash   Has patient had a PCN reaction causing immediate rash, facial/tongue/throat swelling, SOB or lightheadedness with hypotension: Yes Has patient had a PCN reaction causing severe rash involving mucus membranes or skin necrosis: No Has patient had a PCN reaction that required hospitalization No Has patient had a PCN reaction occurring within the last 10 years: No If all of the above answers are "NO", then may proceed with Cephalosporin use.   Tetracycline Rash      Medication List    TAKE these medications   acetaminophen 500 MG tablet Commonly known as:  TYLENOL Take 500 mg by mouth every 6 (six) hours as needed for moderate pain.   ALPRAZolam 0.5 MG tablet Commonly known as:  XANAX Take 0.25 mg by mouth daily as needed for anxiety.   apixaban 5 MG Tabs tablet Commonly known as:  ELIQUIS Take 1 tablet (5 mg total) by mouth 2 (two) times daily.   atorvastatin 40 MG tablet Commonly known as:  LIPITOR Take 1 tablet (40 mg total) by mouth daily at 6 PM.   cholecalciferol 1000 units tablet Commonly known as:  VITAMIN D Take 1,000 Units by mouth every morning.   guaiFENesin 600 MG 12 hr tablet Commonly known as:  MUCINEX Take  600 mg by mouth 2 (two) times daily as needed for cough.   losartan-hydrochlorothiazide 100-25 MG tablet Commonly known as:  HYZAAR Take 1 tablet by mouth every morning.   metoprolol tartrate 25 MG tablet Commonly known as:  LOPRESSOR Take 1 tablet (25 mg total) by mouth 2 (two) times daily.   Misc. Devices Misc by Does not apply route at bedtime. CPAP   multivitamin with minerals tablet Take 1 tablet by mouth every morning. Centrum Silver Womens 50+   potassium chloride SA 20 MEQ tablet Commonly known as:  K-DUR,KLOR-CON Take 1 tablet (20 mEq total) by mouth daily.   PROAIR HFA 108 (90 Base) MCG/ACT inhaler Generic drug:  albuterol INHALE TWO PUFFS BY MOUTH EVERY 6 HOURS AS NEEDED FOR WHEEZING AND SHORTNESS OF BREATH   SYSTANE OP Place 1 drop into both eyes as needed (dry eyes).       Vitals:   03/19/17 1120 03/19/17 1130  BP: 125/67 138/67  Pulse: 70 62  Resp: 14 17  Temp:    SpO2: 94% 96%    Skin clean, dry and intact without evidence of skin break down, no evidence of skin tears noted. IV catheter discontinued intact. Site without signs and symptoms of complications. Dressing and pressure applied. Pt denies pain at this time. No complaints noted.  An After Visit Summary was printed and given to the patient. Patient escorted via [place], and D/C home via private auto.  Chuck Hint RN [facility] [employer]

## 2017-03-21 ENCOUNTER — Encounter (HOSPITAL_COMMUNITY): Payer: Self-pay | Admitting: Gastroenterology

## 2017-03-24 ENCOUNTER — Telehealth: Payer: Self-pay | Admitting: Internal Medicine

## 2017-03-24 DIAGNOSIS — R911 Solitary pulmonary nodule: Secondary | ICD-10-CM

## 2017-03-24 NOTE — Telephone Encounter (Signed)
Spoke with patient Pt stated she had a stroke on 01/17/17, during this admission CTA was preformed  Pt was seen at [facility] on 02/26/17 and made aware that 2 nodules were found on CT Pt would like more information on the nodule findings, and if a appt is needed.  CY please advise  Current Outpatient Medications on File Prior to Visit  Medication Sig Dispense Refill  . acetaminophen (TYLENOL) 500 MG tablet Take 500 mg by mouth every 6 (six) hours as needed for moderate pain.    Marland Kitchen ALPRAZolam (XANAX) 0.5 MG tablet Take 0.25 mg by mouth daily as needed for anxiety.     Marland Kitchen apixaban (ELIQUIS) 5 MG TABS tablet Take 1 tablet (5 mg total) by mouth 2 (two) times daily. 60 tablet 6  . atorvastatin (LIPITOR) 40 MG tablet Take 1 tablet (40 mg total) by mouth daily at 6 PM. 30 tablet 6  . cholecalciferol (VITAMIN D) 1000 UNITS tablet Take 1,000 Units by mouth every morning.     Marland Kitchen guaiFENesin (MUCINEX) 600 MG 12 hr tablet Take 600 mg by mouth 2 (two) times daily as needed for cough.    . losartan-hydrochlorothiazide (HYZAAR) 100-25 MG per tablet Take 1 tablet by mouth every morning.     . metoprolol tartrate (LOPRESSOR) 25 MG tablet Take 1 tablet (25 mg total) by mouth 2 (two) times daily. 60 tablet 6  . Misc. Devices MISC by Does not apply route at bedtime. CPAP    . Multiple Vitamins-Minerals (MULTIVITAMIN WITH MINERALS) tablet Take 1 tablet by mouth every morning. Centrum Silver Womens 50+    . Polyethyl Glycol-Propyl Glycol (SYSTANE OP) Place 1 drop into both eyes as needed (dry eyes).    . potassium chloride SA (K-DUR,KLOR-CON) 20 MEQ tablet Take 1 tablet (20 mEq total) by mouth daily. 30 tablet 6  . PROAIR HFA 108 (90 Base) MCG/ACT inhaler INHALE TWO PUFFS BY MOUTH EVERY 6 HOURS AS NEEDED FOR WHEEZING AND SHORTNESS OF BREATH 8.5 g 0   No current facility-administered medications on file prior to visit.     Allergies  Allergen Reactions  . Bactrim [Sulfamethoxazole-Trimethoprim] Other (See Comments)   Unknown   . Ciprofloxacin Other (See Comments)    GI upset  . Hydroxyzine Hcl     Other reaction(s): Other (See Comments) Pt becomes physchotic.  Marland Kitchen Keflet [Cephalexin] Other (See Comments)    GI upset  . Lisinopril Cough  . Spironolactone Nausea Only  . Vistaril [Hydroxyzine Hcl] Other (See Comments)    Pt becomes physchotic.  . Adhesive [Tape] Itching, Rash and Other (See Comments)    Burning  . Erythromycin Rash  . Latex Rash  . Penicillins Rash    Has patient had a PCN reaction causing immediate rash, facial/tongue/throat swelling, SOB or lightheadedness with hypotension: Yes Has patient had a PCN reaction causing severe rash involving mucus membranes or skin necrosis: No Has patient had a PCN reaction that required hospitalization No Has patient had a PCN reaction occurring within the last 10 years: No If all of the above answers are "NO", then may proceed with Cephalosporin use.   . Tetracycline Rash

## 2017-03-25 NOTE — Telephone Encounter (Signed)
I looked quickly and didn't see mention of lung nodules on CT heart study done in October. Can you ask he if she had a CT of chest somewhere else, or on which study she was told nodules were seen, so I can take a look. Thanks.

## 2017-03-25 NOTE — Telephone Encounter (Signed)
Spoke with pt regarding below message.  Pt states she did not have additional CT scans. Pt state she will call her GYN office to confirm which scan the nodules were seen on.  Will await call back.

## 2017-03-26 ENCOUNTER — Telehealth: Payer: Self-pay | Admitting: Obstetrics & Gynecology

## 2017-03-26 NOTE — Telephone Encounter (Signed)
Spoke with patient in office, advised as seen below per Dr. Sabra Heck. Patient verbalizes understanding and is agreeable.  Routing to provider for final review. Patient is agreeable to disposition. Will close encounter.

## 2017-03-26 NOTE — Telephone Encounter (Signed)
Patient has a question about her last visit with Dr Sabra Heck and where is it noted in the chart regarding her finding 2 nodules on her lungs. Her pulmonary doctor would like to know.

## 2017-03-26 NOTE — Telephone Encounter (Signed)
Patient waiting in lobby; brought in information from GYN; pt decline request to leave the information and wants to speak to a nurse

## 2017-03-26 NOTE — Telephone Encounter (Signed)
The chest CT showed some small nodules in the right lung. Most commonly these will turn out to be scars, but there is a protocol for following them to make sure they are stable.  Please order non-contrast CT chest in 4 months for dx lung nodules,  Please schedule her an ROV with me after that to follow-up.

## 2017-03-26 NOTE — Telephone Encounter (Signed)
Pt called back and stated she will call her GYN today for the information. Will await her call back.

## 2017-03-26 NOTE — Telephone Encounter (Signed)
Spoke with pt. She is aware of CY's response. Order has been placed for follow up CT. ROV has been scheduled for 07/26/17 at 2:30pm. Nothing further was needed.

## 2017-03-26 NOTE — Telephone Encounter (Addendum)
Left message to call Sharee Pimple at [phone].  Reviewed with Dr. Sabra Heck -see Addended CT report dated 01/20/17 attached to CT dated 01/19/17, available in Epic.

## 2017-03-26 NOTE — Telephone Encounter (Signed)
Received CT chest from patient in the lobby. She dropped off a copy. For CY to review. I will place on CY cart.

## 2017-03-26 NOTE — Telephone Encounter (Signed)
ATC pt, no answer. Left message for pt to call back.  

## 2017-03-29 ENCOUNTER — Encounter: Payer: Self-pay | Admitting: Gastroenterology

## 2017-03-31 ENCOUNTER — Telehealth: Payer: Self-pay

## 2017-03-31 NOTE — Telephone Encounter (Signed)
Appointment noted.

## 2017-03-31 NOTE — Telephone Encounter (Signed)
-----   Message from Elmer Ramp sent at 03/31/2017 10:52 AM EST ----- Patient has been scheduled for follow up visit with Dr.Nandigam 05/17/17 at 1:45pm declined sooner appt with APP. Thank you, Lorriane Shire ----- Message ----- From: Doristine Counter, RN Sent: 03/31/2017  10:32 AM To: Elmer Ramp  I was noticing on this patient's discharge instructions she was to schedule follow up visit with Dr. Silverio Decamp. Can you please contact her to schedule follow up with her or APP. Thank you. Almyra Free

## 2017-04-30 ENCOUNTER — Ambulatory Visit: Payer: 59 | Admitting: Gastroenterology

## 2017-05-06 ENCOUNTER — Encounter: Payer: Self-pay | Admitting: Neurology

## 2017-05-06 ENCOUNTER — Ambulatory Visit (INDEPENDENT_AMBULATORY_CARE_PROVIDER_SITE_OTHER): Payer: BLUE CROSS/BLUE SHIELD | Admitting: Neurology

## 2017-05-06 VITALS — BP 150/77 | HR 60 | Ht 59.0 in | Wt 244.4 lb

## 2017-05-06 DIAGNOSIS — E785 Hyperlipidemia, unspecified: Secondary | ICD-10-CM

## 2017-05-06 DIAGNOSIS — I480 Paroxysmal atrial fibrillation: Secondary | ICD-10-CM

## 2017-05-06 DIAGNOSIS — I10 Essential (primary) hypertension: Secondary | ICD-10-CM

## 2017-05-06 DIAGNOSIS — I6349 Cerebral infarction due to embolism of other cerebral artery: Secondary | ICD-10-CM | POA: Diagnosis not present

## 2017-05-06 MED ORDER — ATORVASTATIN CALCIUM 20 MG PO TABS: 20.0000 mg | ORAL_TABLET | Freq: Every day | ORAL | 1 refills | Status: DC

## 2017-05-06 MED ORDER — COENZYME Q10 100 MG PO CAPS: 200.0000 mg | ORAL_CAPSULE | Freq: Every day | ORAL | 1 refills | Status: DC

## 2017-05-06 NOTE — Patient Instructions (Addendum)
I had a long d/w patient about his recent stroke, risk for recurrent stroke/TIAs, personally independently reviewed imaging studies and stroke evaluation results and answered questions.Continue Eliquis (apixaban) daily  for secondary stroke prevention and maintain strict control of hypertension with blood pressure goal below 130/90, diabetes with hemoglobin A1c goal below 6.5% and lipids with LDL cholesterol goal below 70 mg/dL. I also advised the patient to eat a healthy diet with plenty of whole grains, cereals, fruits and vegetables, exercise regularly and maintain ideal body weight Followup in the future with Janett Billow, NP in 6 months. -decrease lipitor from 40mg  to 20mg   -start CoEnzyme 10 200mg  daily to help with statin side effects -increase exercise - activity for atleast 30 minutes per day   DASH Eating Plan DASH stands for "Dietary Approaches to Stop Hypertension." The DASH eating plan is a healthy eating plan that has been shown to reduce high blood pressure (hypertension). It may also reduce your risk for type 2 diabetes, heart disease, and stroke. The DASH eating plan may also help with weight loss. What are tips for following this plan? General guidelines  Avoid eating more than 2,300 mg (milligrams) of salt (sodium) a day. If you have hypertension, you may need to reduce your sodium intake to 1,500 mg a day.  Limit alcohol intake to no more than 1 drink a day for nonpregnant women and 2 drinks a day for men. One drink equals 12 oz of beer, 5 oz of wine, or 1 oz of hard liquor.  Work with your health care provider to maintain a healthy body weight or to lose weight. Ask what an ideal weight is for you.  Get at least 30 minutes of exercise that causes your heart to beat faster (aerobic exercise) most days of the week. Activities may include walking, swimming, or biking.  Work with your health care provider or diet and nutrition specialist (dietitian) to adjust your eating plan to your  individual calorie needs. Reading food labels  Check food labels for the amount of sodium per serving. Choose foods with less than 5 percent of the Daily Value of sodium. Generally, foods with less than 300 mg of sodium per serving fit into this eating plan.  To find whole grains, look for the word "whole" as the first word in the ingredient list. Shopping  Buy products labeled as "low-sodium" or "no salt added."  Buy fresh foods. Avoid canned foods and premade or frozen meals. Cooking  Avoid adding salt when cooking. Use salt-free seasonings or herbs instead of table salt or sea salt. Check with your health care provider or pharmacist before using salt substitutes.  Do not fry foods. Cook foods using healthy methods such as baking, boiling, grilling, and broiling instead.  Cook with heart-healthy oils, such as olive, canola, soybean, or sunflower oil. Meal planning   Eat a balanced diet that includes: ? 5 or more servings of fruits and vegetables each day. At each meal, try to fill half of your plate with fruits and vegetables. ? Up to 6-8 servings of whole grains each day. ? Less than 6 oz of lean meat, poultry, or fish each day. A 3-oz serving of meat is about the same size as a deck of cards. One egg equals 1 oz. ? 2 servings of low-fat dairy each day. ? A serving of nuts, seeds, or beans 5 times each week. ? Heart-healthy fats. Healthy fats called Omega-3 fatty acids are found in foods such as flaxseeds and coldwater  fish, like sardines, salmon, and mackerel.  Limit how much you eat of the following: ? Canned or prepackaged foods. ? Food that is high in trans fat, such as fried foods. ? Food that is high in saturated fat, such as fatty meat. ? Sweets, desserts, sugary drinks, and other foods with added sugar. ? Full-fat dairy products.  Do not salt foods before eating.  Try to eat at least 2 vegetarian meals each week.  Eat more home-cooked food and less restaurant,  buffet, and fast food.  When eating at a restaurant, ask that your food be prepared with less salt or no salt, if possible. What foods are recommended? The items listed may not be a complete list. Talk with your dietitian about what dietary choices are best for you. Grains Whole-grain or whole-wheat bread. Whole-grain or whole-wheat pasta. Brown rice. Modena Morrow. Bulgur. Whole-grain and low-sodium cereals. Pita bread. Low-fat, low-sodium crackers. Whole-wheat flour tortillas. Vegetables Fresh or frozen vegetables (raw, steamed, roasted, or grilled). Low-sodium or reduced-sodium tomato and vegetable juice. Low-sodium or reduced-sodium tomato sauce and tomato paste. Low-sodium or reduced-sodium canned vegetables. Fruits All fresh, dried, or frozen fruit. Canned fruit in natural juice (without added sugar). Meat and other protein foods Skinless chicken or [place]. Ground chicken or [place]. Pork with fat trimmed off. Fish and seafood. Egg whites. Dried beans, peas, or lentils. Unsalted nuts, nut butters, and seeds. Unsalted canned beans. Lean cuts of beef with fat trimmed off. Low-sodium, lean deli meat. Dairy Low-fat (1%) or fat-free (skim) milk. Fat-free, low-fat, or reduced-fat cheeses. Nonfat, low-sodium ricotta or cottage cheese. Low-fat or nonfat yogurt. Low-fat, low-sodium cheese. Fats and oils Soft margarine without trans fats. Vegetable oil. Low-fat, reduced-fat, or light mayonnaise and salad dressings (reduced-sodium). Canola, safflower, olive, soybean, and sunflower oils. Avocado. Seasoning and other foods Herbs. Spices. Seasoning mixes without salt. Unsalted popcorn and pretzels. Fat-free sweets. What foods are not recommended? The items listed may not be a complete list. Talk with your dietitian about what dietary choices are best for you. Grains Baked goods made with fat, such as croissants, muffins, or some breads. Dry pasta or rice meal packs. Vegetables Creamed or fried  vegetables. Vegetables in a cheese sauce. Regular canned vegetables (not low-sodium or reduced-sodium). Regular canned tomato sauce and paste (not low-sodium or reduced-sodium). Regular tomato and vegetable juice (not low-sodium or reduced-sodium). Angie Fava. Olives. Fruits Canned fruit in a light or heavy syrup. Fried fruit. Fruit in cream or butter sauce. Meat and other protein foods Fatty cuts of meat. Ribs. Fried meat. Berniece Salines. Sausage. Bologna and other processed lunch meats. Salami. Fatback. Hotdogs. Bratwurst. Salted nuts and seeds. Canned beans with added salt. Canned or smoked fish. Whole eggs or egg yolks. Chicken or [place] with skin. Dairy Whole or 2% milk, cream, and half-and-half. Whole or full-fat cream cheese. Whole-fat or sweetened yogurt. Full-fat cheese. Nondairy creamers. Whipped toppings. Processed cheese and cheese spreads. Fats and oils Butter. Stick margarine. Lard. Shortening. Ghee. Bacon fat. Tropical oils, such as coconut, palm kernel, or palm oil. Seasoning and other foods Salted popcorn and pretzels. Onion salt, garlic salt, seasoned salt, table salt, and sea salt. Worcestershire sauce. Tartar sauce. Barbecue sauce. Teriyaki sauce. Soy sauce, including reduced-sodium. Steak sauce. Canned and packaged gravies. Fish sauce. Oyster sauce. Cocktail sauce. Horseradish that you find on the shelf. Ketchup. Mustard. Meat flavorings and tenderizers. Bouillon cubes. Hot sauce and Tabasco sauce. Premade or packaged marinades. Premade or packaged taco seasonings. Relishes. Regular salad dressings. Where to find more  information:  National Heart, Lung, and Blood Institute: https://wilson-eaton.com/  American Heart Association: www.heart.org Summary  The DASH eating plan is a healthy eating plan that has been shown to reduce high blood pressure (hypertension). It may also reduce your risk for type 2 diabetes, heart disease, and stroke.  With the DASH eating plan, you should limit salt (sodium)  intake to 2,300 mg a day. If you have hypertension, you may need to reduce your sodium intake to 1,500 mg a day.  When on the DASH eating plan, aim to eat more fresh fruits and vegetables, whole grains, lean proteins, low-fat dairy, and heart-healthy fats.  Work with your health care provider or diet and nutrition specialist (dietitian) to adjust your eating plan to your individual calorie needs. This information is not intended to replace advice given to you by your health care provider. Make sure you discuss any questions you have with your health care provider. Document Released: 02/26/2011 Document Revised: 03/02/2016 Document Reviewed: 03/02/2016 Elsevier Interactive Patient Education  2018 [employer].  Stroke Prevention Some health problems and behaviors may make it more likely for you to have a stroke. Below are ways to lessen your risk of having a stroke.  Be active for at least 30 minutes on most or all days.  Do not smoke. Try not to be around others who smoke.  Do not drink too much alcohol. ? Do not have more than 2 drinks a day if you are a man. ? Do not have more than 1 drink a day if you are a woman and are not pregnant.  Eat healthy foods, such as fruits and vegetables. If you were put on a specific diet, follow the diet as told.  Keep your cholesterol levels under control through diet and medicines. Look for foods that are low in saturated fat, trans fat, cholesterol, and are high in fiber.  If you have diabetes, follow all diet plans and take your medicine as told.  Ask your doctor if you need treatment to lower your blood pressure. If you have high blood pressure (hypertension), follow all diet plans and take your medicine as told by your doctor.  If you are 2-33 years old, have your blood pressure checked every 3-5 years. If you are age 72 or older, have your blood pressure checked every year.  Keep a healthy weight. Eat foods that are low in calories, salt,  saturated fat, trans fat, and cholesterol.  Do not take drugs.  Avoid birth control pills, if this applies. Talk to your doctor about the risks of taking birth control pills.  Talk to your doctor if you have sleep problems (sleep apnea).  Take all medicine as told by your doctor. ? You may be told to take aspirin or blood thinner medicine. Take this medicine as told by your doctor. ? Understand your medicine instructions.  Make sure any other conditions you have are being taken care of.  Get help right away if:  You suddenly lose feeling (you feel numb) or have weakness in your face, arm, or leg.  Your face or eyelid hangs down to one side.  You suddenly feel confused.  You have trouble talking (aphasia) or understanding what people are saying.  You suddenly have trouble seeing in one or both eyes.  You suddenly have trouble walking.  You are dizzy.  You lose your balance or your movements are clumsy (uncoordinated).  You suddenly have a very bad headache and you do not know the  cause.  You have new chest pain.  Your heart feels like it is fluttering or skipping a beat (irregular heartbeat). Do not wait to see if the symptoms above go away. Get help right away. Call your local emergency services (911 in [place] Do not drive yourself to the hospital. This information is not intended to replace advice given to you by your health care provider. Make sure you discuss any questions you have with your health care provider. Document Released: 09/08/2011 Document Revised: 08/15/2015 Document Reviewed: 09/09/2012 Elsevier Interactive Patient Education  [employer].

## 2017-05-06 NOTE — Progress Notes (Signed)
[facility] [address] [place]. [place] [address] [phone]       OFFICE FOLLOW UP NOTE  [name] Date of Birth:  04/18/1945 Medical Record Number:  [id number]   Reason for Referral:  Hospital stroke follow up  CHIEF COMPLAINT:  Chief Complaint  Patient presents with  . 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 History obtained from patient and chart review. Reviewed all radiology images and labs personally. Nicole Cabrera is a 72 year old female with PMH of HTN, PAF, HLD, OSA on CPAP and morbid obesity who was admitted to [facility] for slurred speech/word finding difficulties, HA, right visual field  cut and right sided weakness. Patient used Tele Neurology at [facility] hospital and was transferred to [facility] for evaluation of stroke. CT negative for hemorrhage. MRI reviewed and showed acute small right paretal infarct and subacute left patchy parietal infarct with petechial hemorrhage. CTA negative for intracranial stenosis. 2D Echo negative for PFO or cardiac source of embolis and showed LVEF 55-60%. LDL 99 and HgbA1c 6.3. Prior to admission patient was on aspirin 81mg .  Patient was diagnosed with atrial fibrillation in June 2018. And was prescribed eliquis but Patient opted to hold off on anticoagulation due to upcoming colonoscopy and did not restart her eliquis after the procedure.  Patient started on Eliquis during hospitalization in October 2018 for stroke.  Patient also discharged on Lipitor 40 mg.  Patient discharged home in stable condition.            Since discharge, patient has been doing well overall.  Patient is accompanied at today's visit by her husband.  Patient continues to take Eliquis for atrial  fibrillation and denies side effects of increased bleeding or bruising.  Patient did have hospitalization on 03/18/17 for rectal bleeding which was found to be related to recent polypectomy.  Patient recovered well and was restarted on Eliquis at discharge.  Patient continues to take Lipitor 40 mg but is complaining of increased muscle and joint pains.  Patient has a history of OSA and does use CPAP nightly.  Patient does complain that she feels his memory has worsened since his stroke but does have a history of memory issues prior to the stroke.  Blood pressure at today's visit is 150/77.  Patient states she does check this at home and typically runs 140/70-80.  Patient denies new or worsening stroke/TIA symptoms.  ROS:   14 system review of systems performed and negative with exception of apnea, joint pain, back pain, aching muscles, walking difficulty, itching, and memory loss.  PMH:  Past Medical History:  Diagnosis Date  . Allergy   . Anxiety    xanax  . Arthritis    shoulders/hips - tx with OTC meds  . Breast cyst 03/1990   pt denies 12/05/13  . Bronchitis   . Cataract   . Cervical tab 1972  . 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 Diabetes mellitus without complication ([place])    stated not diabetic but her AIC has been high so she is monitored closely   . Endometrial polyp 04/2000  . Heart murmur   . Hemorrhoids   . Hypertension   . Kidney stones   .  Obesity   . Osteopenia 05/2007  . Peripheral neuropathy   . PMB (postmenopausal bleeding) 04/2000  . SAB (spontaneous abortion) 1981  . Sleep apnea    CPAP  . Stroke ([place])   . SVD (spontaneous vaginal delivery) 04/1964, 05/1978   x3  . TIA (transient ischemic attack)     PSH:  Past Surgical History:  Procedure Laterality Date  . CERVICAL DISC SURGERY  2/93  . CESAREAN SECTION  6/83   with BTL  . COLONOSCOPY N/A 03/19/2017   Procedure: COLONOSCOPY;  Surgeon: Doran Stabler, MD;  Location: [facility];   Service: Gastroenterology;  Laterality: N/A;  . COLONOSCOPY W/ POLYPECTOMY    . HYSTEROSCOPY  5/02, 01/2011  . LITHOTRIPSY  08/30/2014   with Dr. Jeffie Pollock at [facility]  . TOTAL KNEE ARTHROPLASTY Right 6/08   right  . TOTAL KNEE ARTHROPLASTY Left 4/10   left   . WRIST SURGERY Left 4/07   left    Social History:  Social History   Socioeconomic History  . Marital status: Married    Spouse name: [name]  . Number of children: 3  . Years of education: college  . Highest education level: Not on file  Social Needs  . Financial resource strain: Not on file  . Food insecurity - worry: Not on file  . Food insecurity - inability: Not on file  . Transportation needs - medical: Not on file  . Transportation needs - non-medical: Not on file  Occupational History  . 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 Smoking status: Never Smoker  . Smokeless tobacco: Never Used  Substance and Sexual Activity  . Alcohol use: No    Alcohol/week: 0.0 oz    Comment: maybe once a month  . Drug use: No  . Sexual activity: No    Partners: Male    Birth control/protection: Surgical    Comment: BTL  Other Topics Concern  . Not on file  Social History Narrative   Patient is married [name])   Patient has three children.   Patient is a homemaker.   Education- College   Right handed.   Caffeine- one coke cola           Family History:  Family History  Problem Relation Age of Onset  . Heart disease Father   . Colon cancer Mother   . Heart disease Mother   . Breast cancer Maternal Aunt   . Colon cancer Maternal Aunt   . Colon cancer Maternal Uncle   . Ehlers-Danlos syndrome Other        neice-being tested 4/15    Medications:   Current Outpatient Medications on File Prior to Visit  Medication Sig Dispense Refill  . acetaminophen (TYLENOL) 500 MG tablet Take 500 mg by mouth every 6 (six) hours as needed for moderate pain.    Marland Kitchen ALPRAZolam  (XANAX) 0.5 MG tablet Take 0.25 mg by mouth daily as needed for anxiety.     Marland Kitchen apixaban (ELIQUIS) 5 MG TABS tablet Take 1 tablet (5 mg total) by mouth 2 (two) times daily. 60 tablet 6  . cholecalciferol (VITAMIN D) 1000 UNITS tablet Take 1,000 Units by mouth every morning.     Marland Kitchen guaiFENesin (MUCINEX) 600 MG 12 hr tablet Take 600 mg by mouth 2 (two) times daily as needed for cough.    . losartan-hydrochlorothiazide (HYZAAR) 100-25 MG per tablet Take 1 tablet by mouth  every morning.     . metoprolol tartrate (LOPRESSOR) 25 MG tablet Take 1 tablet (25 mg total) by mouth 2 (two) times daily. 60 tablet 6  . Misc. Devices MISC by Does not apply route at bedtime. CPAP    . Multiple Vitamins-Minerals (MULTIVITAMIN WITH MINERALS) tablet Take 1 tablet by mouth every morning. Centrum Silver Womens 50+    . Polyethyl Glycol-Propyl Glycol (SYSTANE OP) Place 1 drop into both eyes as needed (dry eyes).    . potassium chloride SA (K-DUR,KLOR-CON) 20 MEQ tablet Take 1 tablet (20 mEq total) by mouth daily. 30 tablet 6  . PROAIR HFA 108 (90 Base) MCG/ACT inhaler INHALE TWO PUFFS BY MOUTH EVERY 6 HOURS AS NEEDED FOR WHEEZING AND SHORTNESS OF BREATH 8.5 g 0   No current facility-administered medications on file prior to visit.     Allergies:   Allergies  Allergen Reactions  . Bactrim [Sulfamethoxazole-Trimethoprim] Other (See Comments)    Unknown   . Ciprofloxacin Other (See Comments)    GI upset  . Hydroxyzine Hcl     Other reaction(s): Other (See Comments) Pt becomes physchotic.  Marland Kitchen Keflet [Cephalexin] Other (See Comments)    GI upset  . Lisinopril Cough  . Spironolactone Nausea Only  . Vistaril [Hydroxyzine Hcl] Other (See Comments)    Pt becomes physchotic.  . Adhesive [Tape] Itching, Rash and Other (See Comments)    Burning  . Erythromycin Rash  . Latex Rash  . Penicillins Rash    Has patient had a PCN reaction causing immediate rash, facial/tongue/throat swelling, SOB or lightheadedness with  hypotension: Yes Has patient had a PCN reaction causing severe rash involving mucus membranes or skin necrosis: No Has patient had a PCN reaction that required hospitalization No Has patient had a PCN reaction occurring within the last 10 years: No If all of the above answers are "NO", then may proceed with Cephalosporin use.   . Tetracycline Rash    Physical Exam  Vitals:   05/06/17 1347  BP: (!) 150/77  Pulse: 60  Weight: 244 lb 6.4 oz (110.9 kg)  Height: 4\' 11"  (1.499 m)   Body mass index is 49.36 kg/m. No exam data present  General: well developed, elderly caucasian obese female, well nourished, seated, in no evident distress Head: head normocephalic and atraumatic.   Neck: supple with no carotid or supraclavicular bruits Cardiovascular: regular rate and rhythm, no murmurs Musculoskeletal: no deformity Skin:  no rash/petichiae Vascular:  Normal pulses all extremities  Neurologic Exam Mental Status: Awake and fully alert. Oriented to place and time. Recent and remote memory intact. Clock drawing 4/4. Recall 2/3. Animal naming 14 in 60 seconds. Attention span, concentration and fund of knowledge appropriate. Mood and affect appropriate.  Cranial Nerves: Fundoscopic exam reveals sharp disc margins. Pupils equal, briskly reactive to light. Extraocular movements full without nystagmus. Visual fields full to confrontation. Hearing intact. Facial sensation intact. Face, tongue, palate moves normally and symmetrically.  Motor: Normal bulk and tone. Normal strength in all tested extremity muscles. Sensory: intact to touch , pinprick , position and vibratory sensation.  Coordination: Rapid alternating movements normal in all extremities. Finger-to-nose and heel-to-shin performed accurately bilaterally. Gait and Station: Arises from chair without difficulty. Stance is normal. Gait demonstrates cane assistance and hobbling gait due to knee pain per patient. Tandem gait difficulty.    Reflexes: 1+ and symmetric. Toes downgoing.    NIHSS  0 Modified Rankin  1 HAS-BLED 3 CHA2DS2-VASc 6    Diagnostic Data (  Labs, Imaging, Testing)  Ct Angio Head W Or Wo Contrast Result Date: 01/18/2017  IMPRESSION: No significant intracranial stenosis Recent small infarcts in the left occipital parietal lobe and right occipital lobe without hemorrhage.   Mr Brain Wo Contrast Result Date: 01/18/2017 IMPRESSION: 1. Acute small RIGHT parietal infarct. Subacute LEFT patchy parietal infarct with petechial hemorrhage. Though PCA territory infarcts are possible, findings favor sequelae of PRES. 2. Mild to moderate chronic small vessel ischemic disease.   Ct Head Code Stroke Wo Contrast Result Date: 01/17/2017 FINDINGS: Brain: No evidence of acute infarction, hemorrhage, hydrocephalus, extra-axial collection or mass lesion/mass effect. Mild volume loss and chronic small vessel ischemia. Vascular: Atherosclerotic calcification.  No high-density vessel. Skull: Negative for fracture Sinuses/Orbits: Negative Other:   2D Echo Left ventricle: The cavity size was normal. There was moderate concentric hypertrophy. Systolic function was normal. The estimated ejection fraction was in the range of 55% to 60%. Wall motion was normal; there were no regional wall motion abnormalities. Diastolic function assessment limited in the setting of mitral annular calcification. Mitral valve: Moderate annular and leaflet calcification with mildly restricted movement. Unchanged compared to prior echocardiogram on 09/28/2016. No other significant valvular abnormality. Left atrium: The atrium was moderately dilated. No other significant valvular abnormality. No significant change compared to prior echocardiogram 09/28/2016.    ASSESSMENT: 72 y.o. year old female here with right parietal infarct and subacute left parietal infarct on 01/18/2017 secondary to embolis due to atrial fibrillation on aspirin 81mg . Vascular  risk factors are HTN, HLD, obesity, sleep apnea and atrial fibrillation.   PLAN:  I had a long d/w patient about his recent stroke, risk for recurrent stroke, personally independently reviewed imaging studies and stroke evaluation results and answered questions.Continue Eliquis (apixaban) daily  for secondary stroke prevention and maintain strict control of hypertension with blood pressure goal below 130/90, diabetes with hemoglobin A1c goal below 6.5% and lipids with LDL cholesterol goal below 70 mg/dL. I also advised the patient to eat a healthy diet with plenty of whole grains, cereals, fruits and vegetables, exercise regularly and maintain ideal body weight she was advised to be compliant with her CPAP for sleep apnea every night. Followup in the future with Janett Billow, NP in 6 months. -decrease lipitor from 40mg  to 20mg  due to possible side effects of muscle pain. If she is unable to tolerate even this dose may consider switching to Crestor in the future -start CoEnzyme 10 200mg  daily to help with statin side effects -increase exercise - activity for atleast 30 minutes per day -memory loss concern - encouraged memory exercises such as suduko or word search   Meds ordered this encounter  Medications  . atorvastatin (LIPITOR) 20 MG tablet    Sig: Take 1 tablet (20 mg total) by mouth daily.    Dispense:  90 tablet    Refill:  1  . Coenzyme Q10 (TH CO Q-10) 100 MG capsule    Sig: Take 2 capsules (200 mg total) by mouth daily.    Dispense:  1 capsule    Refill:  1   Return in about 6 months (around 11/03/2017).  Greater than 50% of time during this 25  minute visit was spent on counseling,explanation of diagnosis, planning of further management of recent stroke, atrial fibrillation, HLD, HTN, and weight loss. Discussion with patient and family and coordination of care and education regarding control of risk factors and importance of anticoagulation medication for atrial fibrillation. Antony Contras, [facility] [phone]  [place], [place] [address]  Phone [phone] Fax [phone]

## 2017-05-17 ENCOUNTER — Other Ambulatory Visit (INDEPENDENT_AMBULATORY_CARE_PROVIDER_SITE_OTHER): Payer: BLUE CROSS/BLUE SHIELD

## 2017-05-17 ENCOUNTER — Ambulatory Visit (INDEPENDENT_AMBULATORY_CARE_PROVIDER_SITE_OTHER): Payer: BLUE CROSS/BLUE SHIELD | Admitting: Gastroenterology

## 2017-05-17 ENCOUNTER — Encounter: Payer: Self-pay | Admitting: Gastroenterology

## 2017-05-17 VITALS — BP 132/80 | HR 67 | Ht 59.5 in | Wt 245.0 lb

## 2017-05-17 DIAGNOSIS — D374 Neoplasm of uncertain behavior of colon: Secondary | ICD-10-CM | POA: Diagnosis not present

## 2017-05-17 DIAGNOSIS — Z86010 Personal history of colonic polyps: Secondary | ICD-10-CM

## 2017-05-17 DIAGNOSIS — Z7901 Long term (current) use of anticoagulants: Secondary | ICD-10-CM

## 2017-05-17 DIAGNOSIS — Z9889 Other specified postprocedural states: Secondary | ICD-10-CM

## 2017-05-17 LAB — HEMATOCRIT: HCT: 40.7 % (ref 36.0–46.0)

## 2017-05-17 LAB — HEMOGLOBIN: Hemoglobin: 13.4 g/dL (ref 12.0–15.0)

## 2017-05-17 NOTE — Patient Instructions (Signed)
Go to the basement for labs today  Your recall colonoscopy is 02/2020   If you are age 73 or older, your body mass index should be between 23-30. Your Body mass index is 48.66 kg/m. If this is out of the aforementioned range listed, please consider follow up with your Primary Care Provider.  If you are age 52 or younger, your body mass index should be between 19-25. Your Body mass index is 48.66 kg/m. If this is out of the aformentioned range listed, please consider follow up with your Primary Care Provider.

## 2017-05-17 NOTE — Progress Notes (Signed)
Nicole Cabrera    [id number]    [birth year]  Primary Care Physician:Tapper, Zella Richer., MD  Referring Physician: [name], MD [place], [place] [address]  Chief complaint: Post polypectomy bleed  HPI: 72 year old female with history of A. fib, CVA on chronic Eliquis status post colonoscopy March 15, 2017 for colorectal cancer screening due to family history of colon cancer in her mother.  Prior to that last colonoscopy was in October 2007.  On March 15, 2017 she had multiple polyps (adenomas) removed, largest was 20 mm multi lobed pedunculated polyp in the ascending colon was removed with hot snare.  Patient presented to ER on March 18, 2017 with rectal bleeding after she restarted Eliquis.  Hemoglobin dropped from 14 to 12, did not require any blood transfusion.  She underwent repeat colonoscopy March 19, 2017, noted a single ulcer at the site of hot snare in ascending colon with no active bleeding, 3 hemostatic clips were placed.   She restarted Eliquis in 3 days.  No further bleeding.  She is feeling well overall. Denies any nausea, vomiting, abdominal pain, melena or bright red blood per rectum    Outpatient Encounter Medications as of 05/17/2017  Medication Sig  . acetaminophen (TYLENOL) 500 MG tablet Take 500 mg by mouth every 6 (six) hours as needed for moderate pain.  Marland Kitchen ALPRAZolam (XANAX) 0.5 MG tablet Take 0.25 mg by mouth daily as needed for anxiety.   Marland Kitchen apixaban (ELIQUIS) 5 MG TABS tablet Take 1 tablet (5 mg total) by mouth 2 (two) times daily.  Marland Kitchen atorvastatin (LIPITOR) 20 MG tablet Take 1 tablet (20 mg total) by mouth daily.  . cholecalciferol (VITAMIN D) 1000 UNITS tablet Take 1,000 Units by mouth every morning.   . Coenzyme Q10 (TH CO Q-10) 100 MG capsule Take 2 capsules (200 mg total) by mouth daily.  Marland Kitchen guaiFENesin (MUCINEX) 600 MG 12 hr tablet Take 600 mg by mouth 2 (two) times daily as needed for cough.  .  losartan-hydrochlorothiazide (HYZAAR) 100-25 MG per tablet Take 1 tablet by mouth every morning.   . metoprolol tartrate (LOPRESSOR) 25 MG tablet Take 1 tablet (25 mg total) by mouth 2 (two) times daily.  . Misc. Devices MISC by Does not apply route at bedtime. CPAP  . Multiple Vitamins-Minerals (MULTIVITAMIN WITH MINERALS) tablet Take 1 tablet by mouth every morning. Centrum Silver Womens 50+  . Polyethyl Glycol-Propyl Glycol (SYSTANE OP) Place 1 drop into both eyes as needed (dry eyes).  . potassium chloride SA (K-DUR,KLOR-CON) 20 MEQ tablet Take 1 tablet (20 mEq total) by mouth daily.  Marland Kitchen PROAIR HFA 108 (90 Base) MCG/ACT inhaler INHALE TWO PUFFS BY MOUTH EVERY 6 HOURS AS NEEDED FOR WHEEZING AND SHORTNESS OF BREATH   No facility-administered encounter medications on file as of 05/17/2017.     Allergies as of 05/17/2017 - Review Complete 05/17/2017  Allergen Reaction Noted  . Bactrim [sulfamethoxazole-trimethoprim] Other (See Comments) 02/24/2013  . Ciprofloxacin Other (See Comments) 06/29/2013  . Hydroxyzine hcl  01/22/2011  . Keflet [cephalexin] Other (See Comments) 06/29/2013  . Lisinopril Cough 02/24/2013  . Spironolactone Nausea Only 02/24/2013  . Vistaril [hydroxyzine hcl] Other (See Comments) 01/22/2011  . Adhesive [tape] Itching, Rash, and Other (See Comments) 07/15/2014  . Erythromycin Rash   . Latex Rash   . Penicillins Rash   . Tetracycline Rash     Past Medical History:  Diagnosis Date  .  Allergy   . Anxiety    xanax  . Arthritis    shoulders/hips - tx with OTC meds  . Breast cyst 03/1990   pt denies 12/05/13  . Bronchitis   . Cataract   . Cervical tab 1972  . 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 Diabetes mellitus without complication ([place])    stated not diabetic but her AIC has been high so she is monitored closely   . Endometrial polyp 04/2000  . Heart murmur   . Hemorrhoids   . Hypertension   . Kidney stones   . Obesity   . Osteopenia 05/2007  .  Peripheral neuropathy   . PMB (postmenopausal bleeding) 04/2000  . SAB (spontaneous abortion) 1981  . Sleep apnea    CPAP  . Stroke ([place])   . SVD (spontaneous vaginal delivery) 04/1964, 05/1978   x3  . TIA (transient ischemic attack)     Past Surgical History:  Procedure Laterality Date  . CERVICAL DISC SURGERY  2/93  . CESAREAN SECTION  6/83   with BTL  . COLONOSCOPY N/A 03/19/2017   Procedure: COLONOSCOPY;  Surgeon: Doran Stabler, MD;  Location: [facility];  Service: Gastroenterology;  Laterality: N/A;  . COLONOSCOPY W/ POLYPECTOMY    . HYSTEROSCOPY  5/02, 01/2011  . LITHOTRIPSY  08/30/2014   with Dr. Jeffie Pollock at [facility]  . TOTAL KNEE ARTHROPLASTY Right 6/08   right  . TOTAL KNEE ARTHROPLASTY Left 4/10   left   . WRIST SURGERY Left 4/07   left    Family History  Problem Relation Age of Onset  . Heart disease Father   . Colon cancer Mother   . Heart disease Mother   . Breast cancer Maternal Aunt   . Colon cancer Maternal Aunt   . Colon cancer Maternal Uncle   . Ehlers-Danlos syndrome Other        neice-being tested 4/15  . Stomach cancer Neg Hx   . Pancreatic cancer Neg Hx     Social History   Socioeconomic History  . Marital status: Married    Spouse name: [name]  . Number of children: 3  . Years of education: college  . Highest education level: Not on file  Social Needs  . Financial resource strain: Not on file  . Food insecurity - worry: Not on file  . Food insecurity - inability: Not on file  . Transportation needs - medical: Not on file  . Transportation needs - non-medical: Not on file  Occupational History  . 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 Smoking status: Never Smoker  . Smokeless tobacco: Never Used  Substance and Sexual Activity  . Alcohol use: No    Alcohol/week: 0.0 oz    Comment: maybe once a month  . Drug use: No  . Sexual activity: No    Partners: Male    Birth  control/protection: Surgical    Comment: BTL  Other Topics Concern  . Not on file  Social History Narrative   Patient is married [name])   Patient has three children.   Patient is a homemaker.   Education- College   Right handed.   Caffeine- one coke cola             Review of systems: Review of Systems  Constitutional: Negative for fever and chills.  HENT: Negative.   Eyes: Negative for blurred vision.  Respiratory: Negative for cough, shortness  of breath and wheezing.   Cardiovascular: Negative for chest pain and palpitations.  Gastrointestinal: as per HPI Genitourinary: Negative for dysuria, urgency, frequency and hematuria.  Musculoskeletal: Positive for myalgias, back pain and joint pain.  Skin: Negative for itching and rash.  Neurological: Negative for dizziness, tremors, focal weakness, seizures and loss of consciousness.  Endo/Heme/Allergies: Positive for seasonal allergies.  Psychiatric/Behavioral: Negative for depression, suicidal ideas and hallucinations.  All other systems reviewed and are negative.   Physical Exam: Vitals:   05/17/17 1339  BP: 132/80  Pulse: 67   Body mass index is 48.66 kg/m. Gen:      No acute distress HEENT:  EOMI, sclera anicteric Neck:     No masses; no thyromegaly Lungs:    Clear to auscultation bilaterally; normal respiratory effort CV:         Regular rate and rhythm; no murmurs Abd:      + bowel sounds; soft, non-tender; no palpable masses, no distension Ext:    No edema; adequate peripheral perfusion Skin:      Warm and dry; no rash Neuro: alert and oriented x 3 Psych: normal mood and affect  Data Reviewed:  Reviewed labs, radiology imaging, old records and pertinent past GI work up   Assessment and Plan/Recommendations:  72 year old female with with history of A. fib, CVA on chronic anticoagulation Eliquis here for follow-up visit after post polypectomy bleed Patient had multiple  adenomas (tubulovillous, tubular  and sessile serrated adenoma) removed on March 15, 2017 Developed post polypectomy bleed, noted clean-based ulcer and ascending colon site of polypectomy with hot snare, treated with hemostatic clips X3 Recheck hemoglobin Due for recall colonoscopy for surveillance in December 2021 Follow-up as needed 15 minutes was spent face-to-face with the patient. Greater than 50% of the time used for counseling as well as treatment plan and follow-up. She had multiple questions which were answered to her satisfaction  K. Denzil Magnuson , MD [phone] Mon-Fri 8a-5p [phone] after 5p, weekends, holidays  CC: [name], MD

## 2017-05-19 ENCOUNTER — Other Ambulatory Visit: Payer: Self-pay | Admitting: Cardiology

## 2017-05-20 ENCOUNTER — Telehealth: Payer: Self-pay

## 2017-05-20 NOTE — Telephone Encounter (Signed)
Opened in error

## 2017-06-22 DIAGNOSIS — G4733 Obstructive sleep apnea (adult) (pediatric): Secondary | ICD-10-CM | POA: Diagnosis not present

## 2017-07-21 ENCOUNTER — Ambulatory Visit (HOSPITAL_COMMUNITY)
Admission: RE | Admit: 2017-07-21 | Discharge: 2017-07-21 | Disposition: A | Discharge status: Home / Self Care | Payer: BLUE CROSS/BLUE SHIELD | Source: Ambulatory Visit | Attending: Internal Medicine | Admitting: Internal Medicine

## 2017-07-21 DIAGNOSIS — R918 Other nonspecific abnormal finding of lung field: Secondary | ICD-10-CM | POA: Insufficient documentation

## 2017-07-21 DIAGNOSIS — N200 Calculus of kidney: Secondary | ICD-10-CM | POA: Insufficient documentation

## 2017-07-21 DIAGNOSIS — I2510 Atherosclerotic heart disease of native coronary artery without angina pectoris: Secondary | ICD-10-CM | POA: Insufficient documentation

## 2017-07-21 DIAGNOSIS — I700 Atherosclerosis of aorta: Secondary | ICD-10-CM | POA: Insufficient documentation

## 2017-07-21 DIAGNOSIS — R911 Solitary pulmonary nodule: Secondary | ICD-10-CM | POA: Diagnosis not present

## 2017-07-26 ENCOUNTER — Ambulatory Visit (INDEPENDENT_AMBULATORY_CARE_PROVIDER_SITE_OTHER): Payer: BLUE CROSS/BLUE SHIELD | Admitting: Internal Medicine

## 2017-07-26 ENCOUNTER — Encounter: Payer: Self-pay | Admitting: Internal Medicine

## 2017-07-26 VITALS — BP 124/80 | HR 69 | Ht 59.0 in | Wt 246.6 lb

## 2017-07-26 DIAGNOSIS — J45909 Unspecified asthma, uncomplicated: Secondary | ICD-10-CM

## 2017-07-26 DIAGNOSIS — G4733 Obstructive sleep apnea (adult) (pediatric): Secondary | ICD-10-CM | POA: Diagnosis not present

## 2017-07-26 NOTE — Progress Notes (Signed)
HPI female never smoker followed for OSA, complicated by HBP, history of bronchitis, PAFib, CVA NPSG 07/12/85-AHI 50.7/hour, desaturation to 84%  --------------------------------------------------------------------------------------------------------- 10/01/16- 71 ear-old female never smoker followed for OSA, complicated by HBP, history of bronchitis CPAP 11/ [facility]- no download available She is using an older machine which she prefers over a newer one. We discussed replacement criteria. Pressure is comfortable. She uses it all night every night. Download is not available with this machine. She denies any breathing problems, unusual shortness of breath, cough, wheeze, palpitations or chest pain. CXR 09/14/16 IMPRESSION: 1. No active cardiopulmonary disease. No evidence of pneumonia or pulmonary edema. 2. Hyperexpanded lungs suggesting COPD. 3. Borderline cardiomegaly is stable.  07/26/2017- 67 ear-old female never smoker followed for OSA, complicated by HBP, history of bronchitis, PAFib, CVA CPAP 11/ [facility]- no download available ----OSA; DME: [facility]-Pt wears CPAP machine but too old to [facility] like to get new machine.  ProAir hfa,  Says she is breathing "fine" with rare need for rescue inhaler.  No sleep disturbance from her breathing. Old CPAP machine-time to replace.  Compliance and control have been good by her description and she continues to benefit. Had self-limited episode of atrial fibrillation, CVA last fall with visual disturbance. CT chest 07/21/2017 1. Basilar pulmonary nodules are perifissural in location, stable and most indicative of subpleural lymph nodes. 2. Aortic atherosclerosis (ICD10-170.0). Coronary artery calcification. 3. Left renal stone.  ROS-see HPI + = positive Constitutional:   No-   weight loss, night sweats, fevers, chills, +fatigue, lassitude. HEENT:   No-  headaches, difficulty swallowing, tooth/dental problems, sore throat,        No-  sneezing, itching, ear ache, nasal congestion, post nasal drip,  CV:  No-   chest pain, orthopnea, PND, swelling in lower extremities, anasarca, dizziness, palpitations Resp: +shortness of breath with exertion or at rest.              No-   productive cough,  + non-productive cough,  No- coughing up of blood.              No-   change in color of mucus.  No- wheezing.   Skin: No-   rash or lesions. GI:  No-   heartburn, indigestion, abdominal pain, nausea, vomiting,  GU: . MS:  No-   joint pain or swelling.  Neuro-     nothing unusual Psych:  No- change in mood or affect. No depression or anxiety.  No memory loss.  OBJ   General- Alert, Oriented, Affect-appropriate, Distress- none acute; + morbid obesity Skin- rash-none, lesions- none, excoriation- none Lymphadenopathy- none Head- atraumatic            Eyes- Gross vision intact, PERRLA, conjunctivae clear secretions            Ears- Hearing, canals-normal            Nose- Clear, no-Septal dev, mucus, polyps, erosion, perforation             Throat- Mallampati III , mucosa clear/ not red , drainage- none, tonsils- atrophic Neck- flexible , trachea midline, no stridor , thyroid nl, carotid no bruit Chest - symmetrical excursion , unlabored           Heart/CV- RRR , no murmur , no gallop  , no rub, nl s1 s2                           -  JVD- none , edema- none, stasis changes- none, varices- none           Lung- clear to P&A, wheeze- none, cough+ deep bronchitic , dullness-none, rub- none           Chest wall-  Abd-  Br/ Gen/ Rectal- Not done, not indicated Extrem- cyanosis- none, clubbing, none, atrophy- none, strength- nl, + cane Neuro- grossly intact to observation

## 2017-07-26 NOTE — Patient Instructions (Signed)
Order- DME [facility] please replace old CPAP machine, change to auto 10-15, mask of choice, humidifier, supplies, Air/View      No break in therapy, good compliance.  Please call if we can help

## 2017-07-30 ENCOUNTER — Ambulatory Visit (INDEPENDENT_AMBULATORY_CARE_PROVIDER_SITE_OTHER): Payer: BLUE CROSS/BLUE SHIELD | Admitting: Urology

## 2017-07-30 DIAGNOSIS — N200 Calculus of kidney: Secondary | ICD-10-CM

## 2017-08-02 ENCOUNTER — Ambulatory Visit (INDEPENDENT_AMBULATORY_CARE_PROVIDER_SITE_OTHER): Payer: BLUE CROSS/BLUE SHIELD | Admitting: Cardiology

## 2017-08-02 ENCOUNTER — Encounter: Payer: Self-pay | Admitting: Cardiology

## 2017-08-02 VITALS — BP 168/90 | HR 65 | Ht 59.0 in | Wt 247.6 lb

## 2017-08-02 DIAGNOSIS — I480 Paroxysmal atrial fibrillation: Secondary | ICD-10-CM

## 2017-08-02 DIAGNOSIS — E782 Mixed hyperlipidemia: Secondary | ICD-10-CM | POA: Diagnosis not present

## 2017-08-02 DIAGNOSIS — I10 Essential (primary) hypertension: Secondary | ICD-10-CM | POA: Diagnosis not present

## 2017-08-02 NOTE — Progress Notes (Signed)
Clinical Summary Nicole Cabrera is a 72 y.o.female seen today for follow up of the following medical problems.   1. Afib - new diagnosis during  ER visit 09/14/16 - started lopressor, some fatigue initially but resolving.   - no recent palpitations - compliant with meds. Small amount of blood in stool recently, only one isolated episode.    2. Hyperlipidemia - neurologist lowered statin, now off - muscle cramps/body aches on atorvastatin - wants avoid statins at this time.   3. OSA - uses CPAP machine, followed by Nicole Cabrera.    3. HTN  Compliant with meds. Earlier this month 124/80 though elevated today.   4. 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 She had refused anticoag prior to that.  - now on eliquis.   5. CAD - 12/2016 coronary CTA: coronary calcium score of 7, mild diffuse nonobstructive disease - elevated trop 12/2016 during admit with cardioembolic stroke, likely embolized to coronaries as well - no recent chest pain.   6. Pulmonary nodules - followed by pulm    Past Medical History:  Diagnosis Date  . Allergy   . Anxiety    xanax  . Arthritis    shoulders/hips - tx with OTC meds  . Breast cyst 03/1990   pt denies 12/05/13  . Bronchitis   . Cataract   . Cervical tab 1972  . 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 Diabetes mellitus without complication ([place])    stated not diabetic but her AIC has been high so she is monitored closely   . Endometrial polyp 04/2000  . Heart murmur   . Hemorrhoids   . Hypertension   . Kidney stones   . Obesity   . Osteopenia 05/2007  . Peripheral neuropathy   . PMB (postmenopausal bleeding) 04/2000  . SAB (spontaneous abortion) 1981  . Sleep apnea    CPAP  . Stroke ([place])   . SVD (spontaneous vaginal delivery) 04/1964, 05/1978   x3  . TIA (transient ischemic attack)      Allergies  Allergen Reactions  . Bactrim [Sulfamethoxazole-Trimethoprim] Other (See Comments)    Unknown   .  Ciprofloxacin Other (See Comments)    GI upset  . Hydroxyzine Hcl     Other reaction(s): Other (See Comments) Pt becomes physchotic.  Marland Kitchen Keflet [Cephalexin] Other (See Comments)    GI upset  . Lipitor [Atorvastatin]     Muscle cramps  . Lisinopril Cough  . Spironolactone Nausea Only  . Vistaril [Hydroxyzine Hcl] Other (See Comments)    Pt becomes physchotic.  Marland Kitchen Erythromycin Rash  . Latex Rash  . Penicillins Rash    Has patient had a PCN reaction causing immediate rash, facial/tongue/throat swelling, SOB or lightheadedness with hypotension: Yes Has patient had a PCN reaction causing severe rash involving mucus membranes or skin necrosis: No Has patient had a PCN reaction that required hospitalization No Has patient had a PCN reaction occurring within the last 10 years: No If all of the above answers are "NO", then may proceed with Cephalosporin use.   . Tape Itching, Rash and Other (See Comments)    Burning Burning  . Tetracycline Rash     Current Outpatient Medications  Medication Sig Dispense Refill  . acetaminophen (TYLENOL) 500 MG tablet Take 500 mg by mouth every 6 (six) hours as needed for moderate pain.    Marland Kitchen ALPRAZolam (XANAX) 0.5 MG tablet Take 0.25 mg by mouth daily as needed for anxiety.     Marland Kitchen  apixaban (ELIQUIS) 5 MG TABS tablet Take 1 tablet (5 mg total) by mouth 2 (two) times daily. 60 tablet 6  . cholecalciferol (VITAMIN D) 1000 UNITS tablet Take 1,000 Units by mouth every morning.     . Coenzyme Q10 (TH CO Q-10) 100 MG capsule Take 2 capsules (200 mg total) by mouth daily. 1 capsule 1  . guaiFENesin (MUCINEX) 600 MG 12 hr tablet Take 600 mg by mouth 2 (two) times daily as needed for cough.    . losartan-hydrochlorothiazide (HYZAAR) 100-25 MG per tablet Take 1 tablet by mouth every morning.     . metoprolol tartrate (LOPRESSOR) 25 MG tablet TAKE ONE TABLET BY MOUTH TWICE DAILY. 60 tablet 6  . Misc. Devices MISC by Does not apply route at bedtime. CPAP    . Multiple  Vitamins-Minerals (MULTIVITAMIN WITH MINERALS) tablet Take 1 tablet by mouth every morning. Centrum Silver Womens 50+    . Polyethyl Glycol-Propyl Glycol (SYSTANE OP) Place 1 drop into both eyes as needed (dry eyes).    . potassium chloride SA (K-DUR,KLOR-CON) 20 MEQ tablet Take 1 tablet (20 mEq total) by mouth daily. 30 tablet 6  . PROAIR HFA 108 (90 Base) MCG/ACT inhaler INHALE TWO PUFFS BY MOUTH EVERY 6 HOURS AS NEEDED FOR WHEEZING AND SHORTNESS OF BREATH 8.5 g 0   No current facility-administered medications for this visit.      Past Surgical History:  Procedure Laterality Date  . CERVICAL DISC SURGERY  2/93  . CESAREAN SECTION  6/83   with BTL  . COLONOSCOPY N/A 03/19/2017   Procedure: COLONOSCOPY;  Surgeon: Doran Stabler, MD;  Location: [place];  Service: Gastroenterology;  Laterality: N/A;  . COLONOSCOPY W/ POLYPECTOMY    . HYSTEROSCOPY  5/02, 01/2011  . LITHOTRIPSY  08/30/2014   with Nicole. Jeffie Pollock at [facility]  . TOTAL KNEE ARTHROPLASTY Right 6/08   right  . TOTAL KNEE ARTHROPLASTY Left 4/10   left   . WRIST SURGERY Left 4/07   left     Allergies  Allergen Reactions  . Bactrim [Sulfamethoxazole-Trimethoprim] Other (See Comments)    Unknown   . Ciprofloxacin Other (See Comments)    GI upset  . Hydroxyzine Hcl     Other reaction(s): Other (See Comments) Pt becomes physchotic.  Marland Kitchen Keflet [Cephalexin] Other (See Comments)    GI upset  . Lipitor [Atorvastatin]     Muscle cramps  . Lisinopril Cough  . Spironolactone Nausea Only  . Vistaril [Hydroxyzine Hcl] Other (See Comments)    Pt becomes physchotic.  Marland Kitchen Erythromycin Rash  . Latex Rash  . Penicillins Rash    Has patient had a PCN reaction causing immediate rash, facial/tongue/throat swelling, SOB or lightheadedness with hypotension: Yes Has patient had a PCN reaction causing severe rash involving mucus membranes or skin necrosis: No Has patient had a PCN reaction that required hospitalization No Has  patient had a PCN reaction occurring within the last 10 years: No If all of the above answers are "NO", then may proceed with Cephalosporin use.   . Tape Itching, Rash and Other (See Comments)    Burning Burning  . Tetracycline Rash      Family History  Problem Relation Age of Onset  . Heart disease Father   . Colon cancer Mother   . Heart disease Mother   . Breast cancer Maternal Aunt   . Colon cancer Maternal Aunt   . Colon cancer Maternal Uncle   . Ehlers-Danlos syndrome Other  neice-being tested 4/15  . Stomach cancer Neg Hx   . Pancreatic cancer Neg Hx      Social History Nicole Cabrera reports that she has never smoked. She has never used smokeless tobacco. Nicole Cabrera reports that she does not drink alcohol.   Review of Systems CONSTITUTIONAL: No weight loss, fever, chills, weakness or fatigue.  HEENT: Eyes: No visual loss, blurred vision, double vision or yellow sclerae.No hearing loss, sneezing, congestion, runny nose or sore throat.  SKIN: No rash or itching.  CARDIOVASCULAR: per hpi RESPIRATORY: No shortness of breath, cough or sputum.  GASTROINTESTINAL: No anorexia, nausea, vomiting or diarrhea. No abdominal pain or blood.  GENITOURINARY: No burning on urination, no polyuria NEUROLOGICAL: No headache, dizziness, syncope, paralysis, ataxia, numbness or tingling in the extremities. No change in bowel or bladder control.  MUSCULOSKELETAL: No muscle, back pain, joint pain or stiffness.  LYMPHATICS: No enlarged nodes. No history of splenectomy.  PSYCHIATRIC: No history of depression or anxiety.  ENDOCRINOLOGIC: No reports of sweating, cold or heat intolerance. No polyuria or polydipsia.  Marland Kitchen   Physical Examination Vitals:   08/02/17 1114  BP: (!) 168/90  Pulse: 65  SpO2: 96%   Vitals:   08/02/17 1114  Weight: 247 lb 9.6 oz (112.3 kg)  Height: 4\' 11"  (1.499 m)    Gen: resting comfortably, no acute distress HEENT: no scleral icterus, pupils equal round  and reactive, no palptable cervical adenopathy,  CV: RRR, no m/r,g no jvd Resp: Clear to auscultation bilaterally GI: abdomen is soft, non-tender, non-distended, normal bowel sounds, no hepatosplenomegaly MSK: extremities are warm, no edema.  Skin: warm, no rash Neuro:  no focal deficits Psych: appropriate affect   Diagnostic Studies  09/2016 echo Study Conclusions  - Left ventricle: The cavity size was normal. Wall thickness was increased in a pattern of mild LVH. Systolic function was normal. The estimated ejection fraction was in the range of 60% to 65%. Wall motion was normal; there were no regional wall motion abnormalities. Features are consistent with a pseudonormal left ventricular filling pattern, with concomitant abnormal relaxation and increased filling pressure (grade 2 diastolic dysfunction). - Aortic valve: Mildly calcified annulus. Trileaflet. There was trivial regurgitation. - Mitral valve: Moderately calcified annulus. The findings are consistent with mild stenosis. There was mild regurgitation. Mean gradient (D): 3 mm Hg. Valve area by pressure half-time: 1.54 cm^2. - Left atrium: The atrium was moderately dilated. - Right atrium: Central venous pressure (est): 8 mm Hg. - Tricuspid valve: There was trivial regurgitation. - Pulmonary arteries: Systolic pressure could not be accurately estimated. - Pericardium, extracardiac: There was no pericardial effusion.  Impressions:  - Mild LVH with LVEF 60-65% and grade 2 diastolic dysfunction. Moderate left atrial enlargement. Moderately calcified mitral annulus with evidence of mild mitral regurgitation and mild mitral stenosis. Sclerotic aortic valve with trivial aortic regurgitation. Trivial tricuspid regurgitation.    Assessment and Plan   1. PAF - no symptoms, continue currents meds including anticoagulation. CHADS2Vasc score is 5 also with prior cardioembolic CVA when not  on anticoag  2. HTN -elevated in clinilc, has been controlled at visits with her other providers.  - continue to monitor  3. Hyperlipidemia - muscle aches on atorvastatin, did not improve on lower dose, now off - does not wish to start alternative statin at this time   F/u 6 months      Arnoldo Lenis, M.D.

## 2017-08-02 NOTE — Patient Instructions (Signed)

## 2017-08-20 ENCOUNTER — Other Ambulatory Visit: Payer: Self-pay | Admitting: Cardiology

## 2017-08-25 ENCOUNTER — Telehealth: Payer: Self-pay | Admitting: Internal Medicine

## 2017-08-25 NOTE — Telephone Encounter (Signed)
Pt's last sleep study has been faxed to [facility]. Nothing further was needed.

## 2017-09-21 DIAGNOSIS — R7301 Impaired fasting glucose: Secondary | ICD-10-CM | POA: Diagnosis not present

## 2017-09-21 DIAGNOSIS — I10 Essential (primary) hypertension: Secondary | ICD-10-CM | POA: Diagnosis not present

## 2017-09-21 DIAGNOSIS — I480 Paroxysmal atrial fibrillation: Secondary | ICD-10-CM | POA: Diagnosis not present

## 2017-09-21 DIAGNOSIS — I6350 Cerebral infarction due to unspecified occlusion or stenosis of unspecified cerebral artery: Secondary | ICD-10-CM | POA: Diagnosis not present

## 2017-10-01 DIAGNOSIS — G4733 Obstructive sleep apnea (adult) (pediatric): Secondary | ICD-10-CM | POA: Diagnosis not present

## 2017-10-06 ENCOUNTER — Encounter (HOSPITAL_COMMUNITY): Payer: Self-pay | Admitting: Emergency Medicine

## 2017-10-06 ENCOUNTER — Emergency Department (HOSPITAL_COMMUNITY)
Admission: EM | Admit: 2017-10-06 | Discharge: 2017-10-06 | Disposition: A | Discharge status: Home / Self Care | Payer: BLUE CROSS/BLUE SHIELD | Attending: Emergency Medicine | Admitting: Emergency Medicine

## 2017-10-06 ENCOUNTER — Emergency Department (HOSPITAL_COMMUNITY): Payer: BLUE CROSS/BLUE SHIELD

## 2017-10-06 DIAGNOSIS — I480 Paroxysmal atrial fibrillation: Secondary | ICD-10-CM

## 2017-10-06 DIAGNOSIS — R0602 Shortness of breath: Secondary | ICD-10-CM | POA: Insufficient documentation

## 2017-10-06 DIAGNOSIS — R002 Palpitations: Secondary | ICD-10-CM | POA: Diagnosis not present

## 2017-10-06 DIAGNOSIS — Z79899 Other long term (current) drug therapy: Secondary | ICD-10-CM | POA: Insufficient documentation

## 2017-10-06 DIAGNOSIS — E119 Type 2 diabetes mellitus without complications: Secondary | ICD-10-CM | POA: Diagnosis not present

## 2017-10-06 DIAGNOSIS — I10 Essential (primary) hypertension: Secondary | ICD-10-CM | POA: Diagnosis not present

## 2017-10-06 DIAGNOSIS — J9811 Atelectasis: Secondary | ICD-10-CM | POA: Diagnosis not present

## 2017-10-06 DIAGNOSIS — Z96653 Presence of artificial knee joint, bilateral: Secondary | ICD-10-CM | POA: Insufficient documentation

## 2017-10-06 DIAGNOSIS — J45909 Unspecified asthma, uncomplicated: Secondary | ICD-10-CM | POA: Insufficient documentation

## 2017-10-06 HISTORY — DX: Paroxysmal atrial fibrillation: I48.0

## 2017-10-06 LAB — CBC WITH DIFFERENTIAL/PLATELET
Basophils Absolute: 0.1 10*3/uL (ref 0.0–0.1)
Basophils Relative: 1 %
Eosinophils Absolute: 0.2 10*3/uL (ref 0.0–0.7)
Eosinophils Relative: 3 %
HCT: 43 % (ref 36.0–46.0)
Hemoglobin: 13.5 g/dL (ref 12.0–15.0)
Lymphocytes Relative: 21 %
Lymphs Abs: 1.8 10*3/uL (ref 0.7–4.0)
MCH: 27.3 pg (ref 26.0–34.0)
MCHC: 31.4 g/dL (ref 30.0–36.0)
MCV: 87 fL (ref 78.0–100.0)
Monocytes Absolute: 0.8 10*3/uL (ref 0.1–1.0)
Monocytes Relative: 10 %
Neutro Abs: 5.4 10*3/uL (ref 1.7–7.7)
Neutrophils Relative %: 65 %
Platelets: 257 10*3/uL (ref 150–400)
RBC: 4.94 MIL/uL (ref 3.87–5.11)
RDW: 15.3 % (ref 11.5–15.5)
WBC: 8.3 10*3/uL (ref 4.0–10.5)

## 2017-10-06 LAB — BASIC METABOLIC PANEL
Anion gap: 7 (ref 5–15)
BUN: 14 mg/dL (ref 8–23)
CO2: 28 mmol/L (ref 22–32)
Calcium: 8.6 mg/dL — ABNORMAL LOW (ref 8.9–10.3)
Chloride: 103 mmol/L (ref 98–111)
Creatinine, Ser: 0.66 mg/dL (ref 0.44–1.00)
GFR calc Af Amer: >60 mL/min (ref >60)
GFR calc non Af Amer: >60 mL/min (ref >60)
Glucose, Bld: 125 mg/dL — ABNORMAL HIGH (ref 70–99)
Potassium: 3.7 mmol/L (ref 3.5–5.1)
Sodium: 138 mmol/L (ref 135–145)

## 2017-10-06 LAB — TROPONIN I: Troponin I: <0.03 ng/mL (ref <0.03)

## 2017-10-06 LAB — MAGNESIUM: Magnesium: 2 mg/dL (ref 1.7–2.4)

## 2017-10-06 LAB — BRAIN NATRIURETIC PEPTIDE: B Natriuretic Peptide: 138 pg/mL — ABNORMAL HIGH (ref 0.0–100.0)

## 2017-10-06 NOTE — ED Provider Notes (Signed)
[facility] EMERGENCY DEPARTMENT Provider Note   CSN: [id number] Arrival date & time: 10/06/17  1041     History   Chief Complaint Chief Complaint  Patient presents with  . Tachycardia    HPI Nicole Cabrera is a 72 y.o. female.    Pt was seen at 1100. Per pt, c/o gradual onset and resolution of one episode of "palpitations" that occurred this morning approximately 0730am. Pt states she could "feel her heart beating." Pt states she took her meds at 8am and her symptoms continued. States her HR "got up to 123" so she came to the ED for evaluation. Palpitations were associated with SOB. Since arrival to the ED, pt states she feels better. Endorses compliance with Eliquis.  Denies CP, no cough, no abd pain, no N/V/D, no back pain.   Past Medical History:  Diagnosis Date  . Allergy   . Anxiety    xanax  . Arthritis    shoulders/hips - tx with OTC meds  . Breast cyst 03/1990   pt denies 12/05/13  . Bronchitis   . Cataract   . Cervical tab 1972  . 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 Diabetes mellitus without complication ([place])    stated not diabetic but her AIC has been high so she is monitored closely   . Endometrial polyp 04/2000  . Heart murmur   . Hemorrhoids   . Hypertension   . Kidney stones   . Obesity   . Osteopenia 05/2007  . Paroxysmal atrial fibrillation ([facility])   . Peripheral neuropathy   . PMB (postmenopausal bleeding) 04/2000  . SAB (spontaneous abortion) 1981  . Sleep apnea    CPAP  . Stroke ([place])   . SVD (spontaneous vaginal delivery) 04/1964, 05/1978   x3  . TIA (transient ischemic attack)     Patient Active Problem List   Diagnosis Date Noted  . Hypokalemia 03/19/2017  . HLD (hyperlipidemia) 03/19/2017  . Hematochezia   . Acute blood loss anemia   . Rectal bleeding 03/18/2017  . Acute GI bleeding   . 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 Stroke (cerebrum) ([place]) 01/18/2017  . Intracerebral hemorrhage 01/18/2017  .  Elevated troponin 01/17/2017  . Atrial fibrillation ([place]) 10/27/2016  . Obesity 06/20/2015  . Nephrolithiasis 07/09/2014  . Asthma with bronchitis 02/23/2014  . Aphasia 02/27/2013  . ACUTE BRONCHITIS 08/30/2009  . Essential hypertension 02/22/2007  . Obstructive sleep apnea 02/22/2007  . ALLERGY 02/22/2007    Past Surgical History:  Procedure Laterality Date  . CERVICAL DISC SURGERY  2/93  . CESAREAN SECTION  6/83   with BTL  . COLONOSCOPY N/A 03/19/2017   Procedure: COLONOSCOPY;  Surgeon: Doran Stabler, MD;  Location: [facility];  Service: Gastroenterology;  Laterality: N/A;  . COLONOSCOPY W/ POLYPECTOMY    . HYSTEROSCOPY  5/02, 01/2011  . LITHOTRIPSY  08/30/2014   with Dr. Jeffie Pollock at [facility]  . TOTAL KNEE ARTHROPLASTY Right 6/08   right  . TOTAL KNEE ARTHROPLASTY Left 4/10   left   . WRIST SURGERY Left 4/07   left     OB History    Gravida  5   Para  3   Term      Preterm      AB  2   Living  3     SAB  1   TAB  1   Ectopic      Multiple      Live Births  Home Medications    Prior to Admission medications   Medication Sig Start Date End Date Taking? Authorizing Provider  cholecalciferol (VITAMIN D) 1000 UNITS tablet Take 1,000 Units by mouth every morning.    Yes [provider]  ELIQUIS 5 MG TABS tablet TAKE ONE TABLET BY MOUTH TWICE DAILY. 08/20/17  Yes BranchAlphonse Guild, MD  guaiFENesin (MUCINEX) 600 MG 12 hr tablet Take 600 mg by mouth 2 (two) times daily as needed.   Yes [provider]  losartan-hydrochlorothiazide (HYZAAR) 100-25 MG per tablet Take 1 tablet by mouth every morning.    Yes [provider]  metoprolol tartrate (LOPRESSOR) 25 MG tablet TAKE ONE TABLET BY MOUTH TWICE DAILY. 05/19/17  Yes Branch, Alphonse Guild, MD  Misc. Devices MISC by Does not apply route at bedtime. CPAP   Yes [provider]  Multiple Vitamins-Minerals (MULTIVITAMIN WITH MINERALS) tablet Take 1 tablet  by mouth every morning. Centrum Silver Womens 50+   Yes [provider]  Polyethyl Glycol-Propyl Glycol (SYSTANE OP) Place 1 drop into both eyes as needed (dry eyes).   Yes [provider]  potassium chloride SA (K-DUR,KLOR-CON) 20 MEQ tablet TAKE ONE TABLET BY MOUTH DAILY. 08/20/17  Yes Arnoldo Lenis, MD  PROAIR HFA 108 [phone] Base) MCG/ACT inhaler INHALE TWO PUFFS BY MOUTH EVERY 6 HOURS AS NEEDED FOR WHEEZING AND SHORTNESS OF BREATH 03/10/17  Yes Deneise Lever, MD    Family History Family History  Problem Relation Age of Onset  . Heart disease Father   . Colon cancer Mother   . Heart disease Mother   . Breast cancer Maternal Aunt   . Colon cancer Maternal Aunt   . Colon cancer Maternal Uncle   . Ehlers-Danlos syndrome Other        neice-being tested 4/15  . Stomach cancer Neg Hx   . Pancreatic cancer Neg Hx     Social History Social History   Tobacco Use  . Smoking status: Never Smoker  . Smokeless tobacco: Never Used  Substance Use Topics  . Alcohol use: No    Alcohol/week: 0.0 oz    Comment: maybe once a month  . Drug use: No     Allergies   Bactrim [sulfamethoxazole-trimethoprim]; Ciprofloxacin; Hydroxyzine hcl; Keflet [cephalexin]; Lipitor [atorvastatin]; Lisinopril; Spironolactone; Vistaril [hydroxyzine hcl]; Erythromycin; Latex; Penicillins; Tape; and Tetracycline   Review of Systems Review of Systems ROS: Statement: All systems negative except as marked or noted in the HPI; Constitutional: Negative for fever and chills. ; ; Eyes: Negative for eye pain, redness and discharge. ; ; ENMT: Negative for ear pain, hoarseness, nasal congestion, sinus pressure and sore throat. ; ; Cardiovascular: +SOB, palpitations. Negative for chest pain, diaphoresis, and peripheral edema. ; ; Respiratory: Negative for cough, wheezing and stridor. ; ; Gastrointestinal: Negative for nausea, vomiting, diarrhea, abdominal pain, blood in stool, hematemesis, jaundice and  rectal bleeding. . ; ; Genitourinary: Negative for dysuria, flank pain and hematuria. ; ; Musculoskeletal: Negative for back pain and neck pain. Negative for swelling and trauma.; ; Skin: Negative for pruritus, rash, abrasions, blisters, bruising and skin lesion.; ; Neuro: Negative for headache, lightheadedness and neck stiffness. Negative for weakness, altered level of consciousness, altered mental status, extremity weakness, paresthesias, involuntary movement, seizure and syncope.       Physical Exam Updated Vital Signs BP 119/62   Pulse (!) 58   Temp (!) 97.5 F (36.4 C) (Oral)   Resp 17   Ht 4\' 10"  (1.473 m)  Wt 112.5 kg (248 lb 1.6 oz)   LMP 01/22/2011   SpO2 95%   BMI 51.85 kg/m    Patient Vitals for the past 24 hrs:  BP Temp Temp src Pulse Resp SpO2 Height Weight  10/06/17 1300 129/62 - - (!) 56 19 90 % - -  10/06/17 1240 136/74 - - (!) 58 18 96 % - -  10/06/17 1230 136/74 - - (!) 57 20 97 % - -  10/06/17 1200 119/62 - - (!) 58 17 95 % - -  10/06/17 1130 126/70 - - 65 (!) 24 95 % - -  10/06/17 1102 126/67 - - - - - - -  10/06/17 1100 126/67 - - 72 (!) 21 95 % - -  10/06/17 1052 - (!) 97.5 F (36.4 C) Oral 78 20 96 % - -  10/06/17 1051 - - - - - - 4\' 10"  (1.473 m) 112.5 kg (248 lb 1.6 oz)     Physical Exam 1105: Physical examination:  Nursing notes reviewed; Vital signs and O2 SAT reviewed;  Constitutional: Well developed, Well nourished, Well hydrated, In no acute distress; Head:  Normocephalic, atraumatic; Eyes: EOMI, PERRL, No scleral icterus; ENMT: Mouth and pharynx normal, Mucous membranes moist; Neck: Supple, Full range of motion, No lymphadenopathy; Cardiovascular: Regular rate and rhythm, No gallop. HR 70's during my exam.; Respiratory: Breath sounds clear & equal bilaterally, No wheezes.  Speaking full sentences with ease, Normal respiratory effort/excursion; Chest: Nontender, Movement normal; Abdomen: Soft, Nontender, Nondistended, Normal bowel sounds;  Genitourinary: No CVA tenderness; Extremities: Peripheral pulses normal, No tenderness, +tr pedal edema bilat. No calf edema or asymmetry.; Neuro: AA&Ox3, Major CN grossly intact.  Speech clear. No gross focal motor or sensory deficits in extremities.; Skin: Color normal, Warm, Dry.   ED Treatments / Results  Labs (all labs ordered are listed, but only abnormal results are displayed)   EKG EKG Interpretation  Date/Time:  Wednesday October 06 2017 11:05:38 EDT Ventricular Rate:  70 PR Interval:    QRS Duration: 88 QT Interval:  413 QTC Calculation: 446 R Axis:   151 Text Interpretation:  Sinus rhythm Prolonged PR interval Right axis deviation Low voltage, precordial leads When compared with ECG of 03/18/2017 No significant change was found Confirmed by Francine Graven [phone]) on 10/06/2017 11:55:39 AM   Radiology   Procedures Procedures (including critical care time)  Medications Ordered in ED Medications - No data to display   Initial Impression / Assessment and Plan / ED Course  I have reviewed the triage vital signs and the nursing notes.  Pertinent labs & imaging results that were available during my care of the patient were reviewed by me and considered in my medical decision making (see chart for details).  MDM Reviewed: previous chart, nursing note and vitals Reviewed previous: labs and ECG Interpretation: labs, ECG and x-ray   Results for orders placed or performed during the hospital encounter of 60/45/40  Basic metabolic panel  Result Value Ref Range   Sodium 138 135 - 145 mmol/L   Potassium 3.7 3.5 - 5.1 mmol/L   Chloride 103 98 - 111 mmol/L   CO2 28 22 - 32 mmol/L   Glucose, Bld 125 (H) 70 - 99 mg/dL   BUN 14 8 - 23 mg/dL   Creatinine, Ser 0.66 0.44 - 1.00 mg/dL   Calcium 8.6 (L) 8.9 - 10.3 mg/dL   GFR calc non Af Amer >60 >60 mL/min   GFR calc Af Amer >60 >60  mL/min   Anion gap 7 5 - 15  Brain natriuretic peptide  Result Value Ref Range   B  Natriuretic Peptide 138.0 (H) 0.0 - 100.0 pg/mL  Troponin I  Result Value Ref Range   Troponin I <0.03 <0.03 ng/mL  CBC with Differential  Result Value Ref Range   WBC 8.3 4.0 - 10.5 K/uL   RBC 4.94 3.87 - 5.11 MIL/uL   Hemoglobin 13.5 12.0 - 15.0 g/dL   HCT 43.0 36.0 - 46.0 %   MCV 87.0 78.0 - 100.0 fL   MCH 27.3 26.0 - 34.0 pg   MCHC 31.4 30.0 - 36.0 g/dL   RDW 15.3 11.5 - 15.5 %   Platelets 257 150 - 400 K/uL   Neutrophils Relative % 65 %   Neutro Abs 5.4 1.7 - 7.7 K/uL   Lymphocytes Relative 21 %   Lymphs Abs 1.8 0.7 - 4.0 K/uL   Monocytes Relative 10 %   Monocytes Absolute 0.8 0.1 - 1.0 K/uL   Eosinophils Relative 3 %   Eosinophils Absolute 0.2 0.0 - 0.7 K/uL   Basophils Relative 1 %   Basophils Absolute 0.1 0.0 - 0.1 K/uL  Magnesium  Result Value Ref Range   Magnesium 2.0 1.7 - 2.4 mg/dL   Dg Chest 2 View Result Date: 10/06/2017 CLINICAL DATA:  Tachycardia, diabetes mellitus EXAM: CHEST - 2 VIEW COMPARISON:  01/17/2017 FINDINGS: Enlargement of cardiac silhouette with pulmonary vascular congestion. Mediastinal contours normal. Minimal RIGHT basilar atelectasis. Lungs otherwise clear. No acute infiltrate, pleural effusion or pneumothorax. Scattered degenerative changes of the thoracic spine with diffuse osseous demineralization. IMPRESSION: Enlargement of cardiac silhouette with pulmonary vascular congestion. Minimal RIGHT basilar atelectasis. Electronically Signed   By: Lavonia Dana M.D.   On: 10/06/2017 12:21    1235:  BNP mildly elevated, no old to compare. Pt's Sats stable. Pt's VS have remained stable while in the ED. HR remains 60-70's, NSR. Pt continues to deny CP/SOB. States she continues to feel "better." No clear indication for admission at this time.  T/C returned from [facility] Dr. Harl Bowie, case discussed, including:  HPI, pertinent PM/SHx, VS/PE, dx testing, ED course and treatment: appears to be pt's first episode, pt can take extra dose of metoprolol if she experiences  symptoms, otherwise take meds as prescribed, f/u office on 8/9 at 1:30pm. Dx and testing, as well as d/w Cards MD, d/w pt and family.  Questions answered.  Verb understanding, agreeable to d/c home with outpt f/u.     Final Clinical Impressions(s) / ED Diagnoses   Final diagnoses:  None    ED Discharge Orders    None       Francine Graven, DO 10/10/17 1735

## 2017-10-06 NOTE — Progress Notes (Signed)
Case discussed with Dr Thurnell Garbe, patient presents with palpitations. History of PAF, by time of ER evaluation symptoms have resolved and EKG shows SR. Likely episode of afib. Had done well based on our appt in May, does not appear to be having frequent episodes. At this time would just instruct her to take extra lopressor 25mg  as needed for palpitations. Ok to d/c from ER, we will arrange earlier f/u   Zandra Abts MD

## 2017-10-06 NOTE — ED Triage Notes (Signed)
Pt reports that her HR was elevated 123 this morning and she could feel it beating. Bp also reports was elevated. Has hx of afib and recent stoke. Denies CP. Some SOB with exertion

## 2017-10-06 NOTE — Discharge Instructions (Addendum)
Avoid avoid caffinated products, such as teas, colas, coffee, chocolate. Avoid over the counter cold medicines, herbal or "natural vitamin" products, and illicit drugs because they can contain stimulants. Take your usual medications as previously directed. Take an extra dose of metoprolol if you experience symptoms like you did today. Go to the Cardiologist's office on August 9th at 1:30pm to be seen in follow up.  Call your regular medical doctor today to schedule a follow up appointment this week.  Return to the Emergency Department immediately if worsening.

## 2017-10-06 NOTE — ED Notes (Signed)
ED Provider at bedside. 

## 2017-10-18 NOTE — Assessment & Plan Note (Signed)
Mild intermittent uncomplicated with infrequent need for rescue inhaler. Plan-continue current care.

## 2017-10-18 NOTE — Assessment & Plan Note (Signed)
Old machine but her compliance and control have been good and she continues to benefit with improved sleep. Plan-replacement CPAP changing to auto 10-15

## 2017-10-29 ENCOUNTER — Ambulatory Visit (INDEPENDENT_AMBULATORY_CARE_PROVIDER_SITE_OTHER): Payer: BLUE CROSS/BLUE SHIELD | Admitting: Student

## 2017-10-29 ENCOUNTER — Encounter: Payer: Self-pay | Admitting: Student

## 2017-10-29 VITALS — BP 142/88 | HR 64 | Ht 59.0 in | Wt 253.2 lb

## 2017-10-29 DIAGNOSIS — I10 Essential (primary) hypertension: Secondary | ICD-10-CM | POA: Diagnosis not present

## 2017-10-29 DIAGNOSIS — G4733 Obstructive sleep apnea (adult) (pediatric): Secondary | ICD-10-CM

## 2017-10-29 DIAGNOSIS — Z7901 Long term (current) use of anticoagulants: Secondary | ICD-10-CM | POA: Diagnosis not present

## 2017-10-29 DIAGNOSIS — I2584 Coronary atherosclerosis due to calcified coronary lesion: Secondary | ICD-10-CM

## 2017-10-29 DIAGNOSIS — I480 Paroxysmal atrial fibrillation: Secondary | ICD-10-CM

## 2017-10-29 DIAGNOSIS — I2510 Atherosclerotic heart disease of native coronary artery without angina pectoris: Secondary | ICD-10-CM

## 2017-10-29 MED ORDER — METOPROLOL TARTRATE 25 MG PO TABS: 25.0000 mg | ORAL_TABLET | Freq: Two times a day (BID) | ORAL | 6 refills | Status: DC

## 2017-10-29 NOTE — Patient Instructions (Signed)
Medication Instructions:   Your physician recommends that you continue on your current medications as directed. Please refer to the Current Medication list given to you today.  You may take an extra lopressor (metoprolol tartrate) as needed for palpitations.  Labwork:  NONE  Testing/Procedures:  NONE  Follow-Up:  Your physician recommends that you schedule a follow-up appointment in: 6 months with Dr. Harl Bowie. You will receive a reminder letter in the mail in about 4 months reminding you to call and schedule your appointment. If you don't receive this letter, please contact our office.  Any Other Special Instructions Will Be Listed Below (If Applicable).  If you need a refill on your cardiac medications before your next appointment, please call your pharmacy.

## 2017-10-29 NOTE — Progress Notes (Signed)
Cardiology Office Note    Date:  10/29/2017   ID:  Nicole Cabrera, Nicole Cabrera 1945-07-30, MRN [id number]  PCP:  Nicole Mealy, MD  Cardiologist: Nicole Dolly, MD    Chief Complaint  Patient presents with  . Follow-up    recent Emergency Dept visit    History of Present Illness:    Nicole Cabrera is a 72 y.o. female with past medical history of paroxysmal atrial fibrillation (on Eliquis for anticoagulation), coronary calcifications (Coronary CT in 12/2016 showing mild nonobstructive disease), HTN, HLD, OSA and prior CVA who presents to the office today for follow-up from a recent emergency department visit.  She was last examined by Nicole Cabrera in 07/2017 and denied any recent palpitations or changes in her respiratory status. She was continued on her current medication regimen at that time, including Eliquis for anticoagulation.  In the interim, she presented to [facility] ED on 10/06/2017 for evaluation of palpitations. Reported her heart rate had been in the 120's when checked at home. Upon evaluation in the ED, she reported improvement in her symptoms and denied any recurrent palpitations. Labs showed WBC 8.3, Hgb 13.5, platelets 257, Na+ 138, K+ 3.7, and creatinine 0.66. Mg 2.0. BNP slightly elevated at 138. Initial troponin negative. CXR on 10/06/2017 showed enlargement of the cardiac silhouette with pulmonary vascular congestion and minimal right basilar atelectasis. EKG showed NSR, HR 70, with first-degree AV block and no acute ST changes when compared to prior tracings. She was discharged and informed to follow-up with Cardiology in the outpatient setting.  Was also informed to take an extra Lopressor tablet if needed for palpitations.  In talking with the patient today, she reports overall doing well since her recent emergency department visit. She denies any recurrent palpitations and reports being in her normal state of health. No recent chest pain, dyspnea on exertion, orthopnea,  PND, or lower extremity edema. Reports good compliance with her Eliquis dosing. No recent melena, hematochezia, or hematuria.  She does not check her blood pressure regularly at home and it is elevated to 160/90 initially during today's visit, improved to 142/88 on recheck. Reports having a history of whitecoat hypertension.   Past Medical History:  Diagnosis Date  . Allergy   . Anxiety    xanax  . Arthritis    shoulders/hips - tx with OTC meds  . Breast cyst 03/1990   pt denies 12/05/13  . Bronchitis   . Cataract   . Cervical tab 1972  . 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 Diabetes mellitus without complication ([place])    stated not diabetic but her AIC has been high so she is monitored closely   . Endometrial polyp 04/2000  . Heart murmur   . Hemorrhoids   . Hypertension   . Kidney stones   . Obesity   . Osteopenia 05/2007  . Paroxysmal atrial fibrillation (HCC)   . Peripheral neuropathy   . PMB (postmenopausal bleeding) 04/2000  . SAB (spontaneous abortion) 1981  . Sleep apnea    CPAP  . Stroke ([place])   . SVD (spontaneous vaginal delivery) 04/1964, 05/1978   x3  . TIA (transient ischemic attack)     Past Surgical History:  Procedure Laterality Date  . CERVICAL DISC SURGERY  2/93  . CESAREAN SECTION  6/83   with BTL  . COLONOSCOPY N/A 03/19/2017   Procedure: COLONOSCOPY;  Surgeon: Doran Stabler, MD;  Location: [facility];  Service: Gastroenterology;  Laterality: N/A;  .  COLONOSCOPY W/ POLYPECTOMY    . HYSTEROSCOPY  5/02, 01/2011  . LITHOTRIPSY  08/30/2014   with Dr. Jeffie Pollock at [facility]  . TOTAL KNEE ARTHROPLASTY Right 6/08   right  . TOTAL KNEE ARTHROPLASTY Left 4/10   left   . WRIST SURGERY Left 4/07   left    Current Medications: Outpatient Medications Prior to Visit  Medication Sig Dispense Refill  . cholecalciferol (VITAMIN D) 1000 UNITS tablet Take 1,000 Units by mouth every morning.     Marland Kitchen ELIQUIS 5 MG TABS tablet TAKE ONE TABLET BY  MOUTH TWICE DAILY. 60 tablet 6  . guaiFENesin (MUCINEX) 600 MG 12 hr tablet Take 600 mg by mouth 2 (two) times daily as needed.    Marland Kitchen losartan-hydrochlorothiazide (HYZAAR) 100-25 MG per tablet Take 1 tablet by mouth every morning.     . Misc. Devices MISC by Does not apply route at bedtime. CPAP    . Multiple Vitamins-Minerals (MULTIVITAMIN WITH MINERALS) tablet Take 1 tablet by mouth every morning. Centrum Silver Womens 50+    . Polyethyl Glycol-Propyl Glycol (SYSTANE OP) Place 1 drop into both eyes as needed (dry eyes).    . potassium chloride SA (K-DUR,KLOR-CON) 20 MEQ tablet TAKE ONE TABLET BY MOUTH DAILY. 30 tablet 6  . PROAIR HFA 108 (90 Base) MCG/ACT inhaler INHALE TWO PUFFS BY MOUTH EVERY 6 HOURS AS NEEDED FOR WHEEZING AND SHORTNESS OF BREATH 8.5 g 0  . metoprolol tartrate (LOPRESSOR) 25 MG tablet TAKE ONE TABLET BY MOUTH TWICE DAILY. 60 tablet 6   No facility-administered medications prior to visit.      Allergies:   Bactrim [sulfamethoxazole-trimethoprim]; Ciprofloxacin; Hydroxyzine hcl; Keflet [cephalexin]; Lipitor [atorvastatin]; Lisinopril; Spironolactone; Vistaril [hydroxyzine hcl]; Erythromycin; Latex; Penicillins; Tape; and Tetracycline   Social History   Socioeconomic History  . Marital status: Married    Spouse name: Nicole Cabrera  . Number of children: 3  . Years of education: college  . Highest education level: Not on file  Occupational History  . 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 Financial resource strain: Not on file  . Food insecurity:    Worry: Not on file    Inability: Not on file  . Transportation needs:    Medical: Not on file    Non-medical: Not on file  Tobacco Use  . Smoking status: Never Smoker  . Smokeless tobacco: Never Used  Substance and Sexual Activity  . Alcohol use: No    Alcohol/week: 0.0 standard drinks    Comment: maybe once a month  . Drug use: No  . Sexual activity: Never    Partners:  Male    Birth control/protection: Surgical    Comment: BTL  Lifestyle  . Physical activity:    Days per week: Not on file    Minutes per session: Not on file  . Stress: Not on file  Relationships  . Social connections:    Talks on phone: Not on file    Gets together: Not on file    Attends religious service: Not on file    Active member of club or organization: Not on file    Attends meetings of clubs or organizations: Not on file    Relationship status: Not on file  Other Topics Concern  . Not on file  Social History Narrative   Patient is married Nicole Cabrera)   Patient has three children.   Patient is a homemaker.   Education- [employer]  Right handed.   Caffeine- one coke cola            Family History:  The patient's family history includes Breast cancer in her maternal aunt; Colon cancer in her maternal aunt, maternal uncle, and mother; Ehlers-Danlos syndrome in her other; Heart disease in her father and mother.   Review of Systems:   Please see the history of present illness.     General:  No chills, fever, night sweats or weight changes.  Cardiovascular:  No chest pain, dyspnea on exertion, edema, orthopnea, paroxysmal nocturnal dyspnea. Positive for palpitations (now resolved).  Dermatological: No rash, lesions/masses Respiratory: No cough, dyspnea Urologic: No hematuria, dysuria Abdominal:   No nausea, vomiting, diarrhea, bright red blood per rectum, melena, or hematemesis Neurologic:  No visual changes, wkns, changes in mental status. All other systems reviewed and are otherwise negative except as noted above.   Physical Exam:    VS:  BP (!) 142/88   Pulse 64   Ht 4\' 11"  (1.499 m)   Wt 253 lb 3.2 oz (114.9 kg)   LMP 01/22/2011   SpO2 93% Comment: on room air  BMI 51.14 kg/m    General: Well developed, well nourished Caucasian female appearing in no acute distress. Head: Normocephalic, atraumatic, sclera non-icteric, no xanthomas, nares are without  discharge.  Neck: No carotid bruits. JVD not elevated.  Lungs: Respirations regular and unlabored, without wheezes or rales.  Heart: Regular rate and rhythm. No S3 or S4.  No murmur, no rubs, or gallops appreciated. Abdomen: Soft, non-tender, non-distended with normoactive bowel sounds. No hepatomegaly. No rebound/guarding. No obvious abdominal masses. Msk:  Strength and tone appear normal for age. No joint deformities or effusions. Extremities: No clubbing or cyanosis. No edema.  Distal pedal pulses are 2+ bilaterally. Neuro: Alert and oriented X 3. Moves all extremities spontaneously. No focal deficits noted. Psych:  Responds to questions appropriately with a normal affect. Skin: No rashes or lesions noted  Wt Readings from Last 3 Encounters:  10/29/17 253 lb 3.2 oz (114.9 kg)  10/06/17 248 lb 1.6 oz (112.5 kg)  08/02/17 247 lb 9.6 oz (112.3 kg)     Studies/Labs Reviewed:   EKG:  EKG is not ordered today. EKG from 10/06/2017 is reviewed which shows NSR, HR 70, with first-degree AV block and no acute ST changes when compared to prior tracings.   Recent Labs: 03/18/2017: ALT 19 10/06/2017: B Natriuretic Peptide 138.0; BUN 14; Creatinine, Ser 0.66; Hemoglobin 13.5; Magnesium 2.0; Platelets 257; Potassium 3.7; Sodium 138   Lipid Panel    Component Value Date/Time   CHOL 168 01/18/2017 0223   TRIG 158 (H) 01/18/2017 0223   HDL 37 (L) 01/18/2017 0223   CHOLHDL 4.5 01/18/2017 0223   VLDL 32 01/18/2017 0223   LDLCALC 99 01/18/2017 0223    Additional studies/ records that were reviewed today include:   Echocardiogram: 12/2016 Study Conclusions  - Left ventricle: The cavity size was normal. There was moderate   concentric hypertrophy. Systolic function was normal. The   estimated ejection fraction was in the range of 55% to 60%. Wall   motion was normal; there were no regional wall motion   abnormalities. Diastolic function assessment limited in the   setting of mitral annular  calcification. - Mitral valve: Moderate annular and leaflet calcification with   mildly restricted movement. Mean PG 5 mmHg at HR 71 bpm MVA by   VTI 1.5 cm2. Unchanged compared to prior echocardiogram on   09/28/2016.  No other significant valvular abnormality. - Left atrium: The atrium was moderately dilated. - No other significant valvular abnormality. - No significant change compared to prior echocardiogram   09/28/2016.  Assessment:    1. Paroxysmal atrial fibrillation (HCC)   2. Current use of long term anticoagulation   3. Essential hypertension   4. Coronary artery calcification   5. OSA (obstructive sleep apnea)      Plan:   In order of problems listed above:  1. Paroxysmal Atrial Fibrillation/ Use of Long-Term Anticoagulation - The patient has a history of known paroxysmal atrial fibrillation and reports having palpitations only every 5 or 6 months. She did develop recent symptoms in 09/2017 during which her heart rate was elevated into the 120's by her report. Upon arrival to the ED, she had converted back to normal sinus rhythm. Labs showed no acute electrolyte abnormalities. She denies any repeat episodes of palpitations since. - Will continue on Lopressor 25 mg twice daily for rate control. Would not further titrate baseline dosing at this time given her resting heart rate is in the low 60's. We reviewed that she can take an additional Lopressor tablet if she develops recurrent symptoms. - She denies any evidence of active bleeding. Remains on Eliquis 5 mg twice daily for anticoagulation.  2. HTN - BP initially elevated to 160/90 during today's visit, improved to 142/88 on recheck. Reports having a history of whitecoat hypertension. I have advised her to continue to follow this in the ambulatory setting and keep a BP log. - Will continue on Hyzaar 100-25 mg daily and Lopressor 25 mg twice daily.  3. Coronary Artery Calcifications - Coronary CT in 12/2016 showed a  coronary calcium score of 7 which was 40th percentile for age and sex matched controls and showed only mild diffuse nonobstructive CAD. - Continue beta-blocker therapy. Not on ASA given the need for anticoagulation. She has been intolerant to multiple statins in the past and is not open to trying additional medications for cholesterol at this time.  4. OSA - Continued compliance with CPAP encouraged. Followed by Pulmonology.    Medication Adjustments/Labs and Tests Ordered: Current medicines are reviewed at length with the patient today.  Concerns regarding medicines are outlined above.  Medication changes, Labs and Tests ordered today are listed in the Patient Instructions below. Patient Instructions  Medication Instructions:   Your physician recommends that you continue on your current medications as directed. Please refer to the Current Medication list given to you today.  You may take an extra lopressor (metoprolol tartrate) as needed for palpitations.  Labwork:  NONE  Testing/Procedures:  NONE  Follow-Up:  Your physician recommends that you schedule a follow-up appointment in: 6 months with Nicole Cabrera. You will receive a reminder letter in the mail in about 4 months reminding you to call and schedule your appointment. If you don't receive this letter, please contact our office.  Any Other Special Instructions Will Be Listed Below (If Applicable).  If you need a refill on your cardiac medications before your next appointment, please call your pharmacy.    Signed, Erma Heritage, PA-C  10/29/2017 4:39 PM    [facility] [address] [place], [place] [address] Phone: [phone]

## 2017-11-01 DIAGNOSIS — G4733 Obstructive sleep apnea (adult) (pediatric): Secondary | ICD-10-CM | POA: Diagnosis not present

## 2017-11-01 DIAGNOSIS — H25013 Cortical age-related cataract, bilateral: Secondary | ICD-10-CM | POA: Diagnosis not present

## 2017-11-01 DIAGNOSIS — E119 Type 2 diabetes mellitus without complications: Secondary | ICD-10-CM | POA: Diagnosis not present

## 2017-11-01 DIAGNOSIS — H2513 Age-related nuclear cataract, bilateral: Secondary | ICD-10-CM | POA: Diagnosis not present

## 2017-11-01 DIAGNOSIS — H524 Presbyopia: Secondary | ICD-10-CM | POA: Diagnosis not present

## 2017-11-01 DIAGNOSIS — H35033 Hypertensive retinopathy, bilateral: Secondary | ICD-10-CM | POA: Diagnosis not present

## 2017-11-03 ENCOUNTER — Ambulatory Visit (INDEPENDENT_AMBULATORY_CARE_PROVIDER_SITE_OTHER): Payer: BLUE CROSS/BLUE SHIELD | Admitting: Adult Health

## 2017-11-03 ENCOUNTER — Encounter: Payer: Self-pay | Admitting: Adult Health

## 2017-11-03 VITALS — BP 167/86 | HR 66 | Ht 59.0 in | Wt 255.2 lb

## 2017-11-03 DIAGNOSIS — I480 Paroxysmal atrial fibrillation: Secondary | ICD-10-CM

## 2017-11-03 DIAGNOSIS — I6349 Cerebral infarction due to embolism of other cerebral artery: Secondary | ICD-10-CM

## 2017-11-03 DIAGNOSIS — E785 Hyperlipidemia, unspecified: Secondary | ICD-10-CM | POA: Diagnosis not present

## 2017-11-03 DIAGNOSIS — I10 Essential (primary) hypertension: Secondary | ICD-10-CM | POA: Diagnosis not present

## 2017-11-03 NOTE — Progress Notes (Signed)
[facility] [address] [place]. [place] [address] [phone]       OFFICE FOLLOW UP NOTE  Nicole Cabrera Date of Birth:  04-23-1945 Medical Record Number:  [id number]   Reason for Referral:  Hospital stroke follow up  CHIEF COMPLAINT:  Chief Complaint  Patient presents with  . 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 History obtained from patient and chart review. Reviewed all radiology images and labs personally. Nicole Cabrera is a 72 year old female with PMH of HTN, PAF, HLD, OSA on CPAP and morbid obesity who was admitted to [facility] for slurred speech/word finding difficulties, HA, right visual field  cut and right sided weakness. Patient used Tele Neurology at [facility] and was transferred to [facility] for evaluation of stroke. CT negative for hemorrhage. MRI reviewed and showed acute small right paretal infarct and subacute left patchy parietal infarct with petechial hemorrhage. CTA negative for intracranial stenosis. 2D Echo negative for PFO or cardiac source of embolis and showed LVEF 55-60%. LDL 99 and HgbA1c 6.3. Prior to admission patient was on aspirin 81mg .  Patient was diagnosed with atrial fibrillation in June 2018. And was prescribed eliquis but Patient opted to hold off on anticoagulation due to upcoming colonoscopy and did not restart her eliquis after the procedure.  Patient started on Eliquis during hospitalization in October 2018 for stroke.  Patient also discharged on Lipitor 40 mg.  Patient discharged home in stable condition.            05/06/16 visit: Since discharge, patient has been doing well overall.  Patient is accompanied at today's visit by her husband.  Patient continues to take Eliquis for atrial fibrillation and denies side  effects of increased bleeding or bruising.  Patient did have hospitalization on 03/18/17 for rectal bleeding which was found to be related to recent polypectomy.  Patient recovered well and was restarted on Eliquis at discharge.  Patient continues to take Lipitor 40 mg but is complaining of increased muscle and joint pains.  Patient has a history of OSA and does use CPAP nightly.  Patient does complain that she feels her memory has worsened since his stroke but does have a history of memory issues prior to the stroke.  Blood pressure at today's visit is 150/77.  Patient states she does check this at home and typically runs 140/70-80.  Patient denies new or worsening stroke/TIA symptoms.  Interval History 11/03/17: Patient is being seen today for scheduled follow-up visit and is accompanied by her son.  She continues to do well from a stroke standpoint.  She continues to take Eliquis without side effects of bleeding or bruising.  She did take decreased dose of Lipitor for 1 month but due to continued myalgias she stopped taking this medication.  Muscle cramping and pain subsided when she stopped taking Lipitor.  She has not had any repeat labs by PCP but states this will be repeated in November at follow-up appointment.  Patient declined to repeat labs at today's visit.  Recommend that if LDL greater than 70 to start statin for HLD management such as Crestor.  Blood pressure today elevated at 167/86 and patient states she does monitor this at home and typical SBP 1 50-1 60.  Patient states she has been struggling attempting to lower blood pressure at her last appointment with PCP it was agreed upon for patient to try to increase exercise and activity along with weight loss prior to adjusting medications.  Patient states she has been unable to manage weight loss at this time.  She does continue to use CPAP for OSA management.  She does complain of pain in bilateral hands which have been present for the past couple  years but have been worsening.  Recommended PCP follow-up regarding this complaint.  Patient also has short-term memory loss complaints which she states became worse when she had the stroke but has been stable since.  Recommended doing memory exercises but patient declined stating "I just do not feel like doing anything".  Denies new or worsening stroke/TIA symptoms.    ROS:   14 system review of systems performed and negative with exception of joint pain, back pain, muscle cramps, walking difficulty, cold intolerance, memory loss and anxious  PMH:  Past Medical History:  Diagnosis Date  . Allergy   . Anxiety    xanax  . Arthritis    shoulders/hips - tx with OTC meds  . Breast cyst 03/1990   pt denies 12/05/13  . Bronchitis   . Cataract   . Cervical tab 1972  . 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 Diabetes mellitus without complication ([place])    stated not diabetic but her AIC has been high so she is monitored closely   . Endometrial polyp 04/2000  . Heart murmur   . Hemorrhoids   . Hypertension   . Kidney stones   . Obesity   . Osteopenia 05/2007  . Paroxysmal atrial fibrillation (HCC)   . Peripheral neuropathy   . PMB (postmenopausal bleeding) 04/2000  . SAB (spontaneous abortion) 1981  . Sleep apnea    CPAP  . Stroke ([place])   . SVD (spontaneous vaginal delivery) 04/1964, 05/1978   x3  . TIA (transient ischemic attack)     PSH:  Past Surgical History:  Procedure Laterality Date  . CERVICAL DISC SURGERY  2/93  . CESAREAN SECTION  6/83   with BTL  . COLONOSCOPY N/A 03/19/2017   Procedure: COLONOSCOPY;  Surgeon: Doran Stabler, MD;  Location: [place];  Service: Gastroenterology;  Laterality: N/A;  . COLONOSCOPY W/ POLYPECTOMY    . HYSTEROSCOPY  5/02, 01/2011  . LITHOTRIPSY  08/30/2014   with Dr. Jeffie Pollock at [facility]  . TOTAL KNEE ARTHROPLASTY Right 6/08   right  . TOTAL KNEE ARTHROPLASTY Left 4/10   left   . WRIST SURGERY Left 4/07   left     Social History:  Social History   Socioeconomic History  . Marital status: Married    Spouse name: [name]  . Number of children: 3  . Years of education: college  . Highest education level: Not on file  Occupational History  . 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 Financial resource strain: Not on file  . Food insecurity:    Worry: Not on file    Inability: Not on file  . Transportation needs:    Medical: Not on file    Non-medical: Not on file  Tobacco Use  . Smoking status: Never Smoker  . Smokeless tobacco: Never Used  Substance and Sexual Activity  . Alcohol use: No    Alcohol/week: 0.0 standard drinks    Comment: maybe once  a month  . Drug use: No  . Sexual activity: Never    Partners: Male    Birth control/protection: Surgical    Comment: BTL  Lifestyle  . Physical activity:    Days per week: Not on file    Minutes per session: Not on file  . Stress: Not on file  Relationships  . Social connections:    Talks on phone: Not on file    Gets together: Not on file    Attends religious service: Not on file    Active member of club or organization: Not on file    Attends meetings of clubs or organizations: Not on file    Relationship status: Not on file  . Intimate partner violence:    Fear of current or ex partner: Not on file    Emotionally abused: Not on file    Physically abused: Not on file    Forced sexual activity: Not on file  Other Topics Concern  . Not on file  Social History Narrative   Patient is married [name])   Patient has three children.   Patient is a homemaker.   Education- College   Right handed.   Caffeine- one coke cola           Family History:  Family History  Problem Relation Age of Onset  . Heart disease Father   . Colon cancer Mother   . Heart disease Mother   . Breast cancer Maternal Aunt   . Colon cancer Maternal Aunt   . Colon cancer Maternal Uncle   .  Ehlers-Danlos syndrome Other        neice-being tested 4/15  . Stomach cancer Neg Hx   . Pancreatic cancer Neg Hx     Medications:   Current Outpatient Medications on File Prior to Visit  Medication Sig Dispense Refill  . cholecalciferol (VITAMIN D) 1000 UNITS tablet Take 1,000 Units by mouth every morning.     Marland Kitchen ELIQUIS 5 MG TABS tablet TAKE ONE TABLET BY MOUTH TWICE DAILY. 60 tablet 6  . guaiFENesin (MUCINEX) 600 MG 12 hr tablet Take 600 mg by mouth 2 (two) times daily as needed.    Marland Kitchen losartan-hydrochlorothiazide (HYZAAR) 100-25 MG per tablet Take 1 tablet by mouth every morning.     . metoprolol tartrate (LOPRESSOR) 25 MG tablet Take 1 tablet (25 mg total) by mouth 2 (two) times daily. May take an extra tablet as needed for palpitations 75 tablet 6  . Misc. Devices MISC by Does not apply route at bedtime. CPAP    . Multiple Vitamins-Minerals (MULTIVITAMIN WITH MINERALS) tablet Take 1 tablet by mouth every morning. Centrum Silver Womens 50+    . Polyethyl Glycol-Propyl Glycol (SYSTANE OP) Place 1 drop into both eyes as needed (dry eyes).    . potassium chloride SA (K-DUR,KLOR-CON) 20 MEQ tablet TAKE ONE TABLET BY MOUTH DAILY. 30 tablet 6  . PROAIR HFA 108 (90 Base) MCG/ACT inhaler INHALE TWO PUFFS BY MOUTH EVERY 6 HOURS AS NEEDED FOR WHEEZING AND SHORTNESS OF BREATH 8.5 g 0   No current facility-administered medications on file prior to visit.     Allergies:   Allergies  Allergen Reactions  . Bactrim [Sulfamethoxazole-Trimethoprim] Other (See Comments)    Unknown   . Ciprofloxacin Other (See Comments)    GI upset  . Hydroxyzine Hcl     Other reaction(s): Other (See Comments) Pt becomes physchotic.  Marland Kitchen Keflet [Cephalexin] Other (See Comments)    GI upset  .  Lipitor [Atorvastatin]     Muscle cramps  . Lisinopril Cough  . Spironolactone Nausea Only  . Vistaril [Hydroxyzine Hcl] Other (See Comments)    Pt becomes physchotic.  Marland Kitchen Erythromycin Rash  . Latex Rash  . Penicillins  Rash    Has patient had a PCN reaction causing immediate rash, facial/tongue/throat swelling, SOB or lightheadedness with hypotension: Yes Has patient had a PCN reaction causing severe rash involving mucus membranes or skin necrosis: No Has patient had a PCN reaction that required hospitalization No Has patient had a PCN reaction occurring within the last 10 years: No If all of the above answers are "NO", then may proceed with Cephalosporin use.   . Tape Itching, Rash and Other (See Comments)    Burning Burning  . Tetracycline Rash    Physical Exam  Vitals:   11/03/17 1540  BP: (!) 167/86  Pulse: 66  Weight: 255 lb 3.2 oz (115.8 kg)  Height: 4\' 11"  (1.499 m)   Body mass index is 51.54 kg/m. No exam data present  General: elderly caucasian obese female, well nourished, seated, in no evident distress Head: head normocephalic and atraumatic.   Neck: supple with no carotid or supraclavicular bruits Cardiovascular: regular rate and rhythm, no murmurs Musculoskeletal: no deformity Skin:  no rash/petichiae Vascular:  Normal pulses all extremities  Neurologic Exam Mental Status: Awake and fully alert. Oriented to place and time. Remote memory intact. Attention span, concentration and fund of knowledge appropriate. Mood and affect appropriate.  Cranial Nerves: Fundoscopic exam reveals sharp disc margins. Pupils equal, briskly reactive to light. Extraocular movements full without nystagmus. Visual fields full to confrontation. Hearing intact. Facial sensation intact. Face, tongue, palate moves normally and symmetrically.  Motor: Normal bulk and tone. Normal strength in all tested extremity muscles. Sensory: intact to touch , pinprick , position and vibratory sensation.  Coordination: Rapid alternating movements normal in all extremities. Finger-to-nose and heel-to-shin performed accurately bilaterally. Gait and Station: Arises from chair without difficulty. Stance is normal. Gait  demonstrates cane assistance and hobbling gait due to knee pain per patient. Tandem gait difficulty.  Reflexes: 1+ and symmetric. Toes downgoing.      Diagnostic Data (Labs, Imaging, Testing)  Ct Angio Head W Or Wo Contrast Result Date: 01/18/2017  IMPRESSION: No significant intracranial stenosis Recent small infarcts in the left occipital parietal lobe and right occipital lobe without hemorrhage.   Mr Brain Wo Contrast Result Date: 01/18/2017 IMPRESSION: 1. Acute small RIGHT parietal infarct. Subacute LEFT patchy parietal infarct with petechial hemorrhage. Though PCA territory infarcts are possible, findings favor sequelae of PRES. 2. Mild to moderate chronic small vessel ischemic disease.   Ct Head Code Stroke Wo Contrast Result Date: 01/17/2017 FINDINGS: Brain: No evidence of acute infarction, hemorrhage, hydrocephalus, extra-axial collection or mass lesion/mass effect. Mild volume loss and chronic small vessel ischemia. Vascular: Atherosclerotic calcification.  No high-density vessel. Skull: Negative for fracture Sinuses/Orbits: Negative Other:   2D Echo Left ventricle: The cavity size was normal. There was moderate concentric hypertrophy. Systolic function was normal. The estimated ejection fraction was in the range of 55% to 60%. Wall motion was normal; there were no regional wall motion abnormalities. Diastolic function assessment limited in the setting of mitral annular calcification. Mitral valve: Moderate annular and leaflet calcification with mildly restricted movement. Unchanged compared to prior echocardiogram on 09/28/2016. No other significant valvular abnormality. Left atrium: The atrium was moderately dilated. No other significant valvular abnormality. No significant change compared to prior echocardiogram 09/28/2016.  ASSESSMENT: 72 y.o. year old female here with right parietal infarct and subacute left parietal infarct on 01/18/2017 secondary to embolis due to atrial  fibrillation on aspirin 81mg . Vascular risk factors are HTN, HLD, obesity, sleep apnea and atrial fibrillation.  Patient is being seen today for follow-up and overall is stable from a stroke standpoint.  PLAN:  -Continue Eliquis (apixaban) daily  for secondary stroke prevention -F/u with PCP regarding your HLD and HTN management -recommended lipid panel check a follow-up appointment with PCP as patient requested and consider initiating statin if LDL greater than 70 -patient would not like to start any additional statins at this time until cholesterol is checked -f/u with cardiologist as scheduled for atrial fibrillation and Eliquis management -continue to monitor BP at home -Encourage mind exercises such as board games or crossword puzzles to help with short-term memory complaints -Encouraged to increase activity and daily exercise along with maintaining a healthy diet -Maintain strict control of hypertension with blood pressure goal below 130/90, diabetes with hemoglobin A1c goal below 6.5% and cholesterol with LDL cholesterol (bad cholesterol) goal below 70 mg/dL. I also advised the patient to eat a healthy diet with plenty of whole grains, cereals, fruits and vegetables, exercise regularly and maintain ideal body weight.  Follow up in 6 months or call earlier if needed  Greater than 50% of time during this 25  minute visit was spent on counseling,explanation of diagnosis, planning of further management of recent stroke, atrial fibrillation, HLD, HTN, and weight loss. Discussion with patient and family and coordination of care and education regarding control of risk factors and importance of anticoagulation medication for atrial fibrillation.  Venancio Poisson, AGNP-BC  [facility] [address] [place], [place] [address]  Phone [phone] Fax [phone] Note: This document was prepared with digital dictation and possible smart phrase technology. Any  transcriptional errors that result from this process are unintentional.

## 2017-11-03 NOTE — Patient Instructions (Signed)
Continue Eliquis (apixaban) daily for secondary stroke prevention  Continue to follow up with PCP regarding cholesterol and blood pressure management along with bilateral hand pain concerns Have PCP check cholesterol levels at follow up visit - if LDL greater than 70, recommend to start on statin such as crestor as you were unable to tolerate lipitor  Continue to follow up with cardiologist regarding atrial fibrillation and eliquis management   Continue to monitor blood pressure at home  Continue to stay active as tolerated and maintain a healthy diet  Maintain strict control of hypertension with blood pressure goal below 130/90, diabetes with hemoglobin A1c goal below 6.5% and cholesterol with LDL cholesterol (bad cholesterol) goal below 70 mg/dL. I also advised the patient to eat a healthy diet with plenty of whole grains, cereals, fruits and vegetables, exercise regularly and maintain ideal body weight.  Followup in the future with me in 6 months or call earlier if needed       Thank you for coming to see [place] at [facility]. I hope we have been able to provide you high quality care today.  You may receive a patient satisfaction survey over the next few weeks. We would appreciate your feedback and comments so that we may continue to improve ourselves and the health of our patients.

## 2017-11-04 NOTE — Progress Notes (Signed)
I agree with the above plan 

## 2017-11-16 ENCOUNTER — Other Ambulatory Visit: Payer: Self-pay | Admitting: Internal Medicine

## 2017-12-02 DIAGNOSIS — G4733 Obstructive sleep apnea (adult) (pediatric): Secondary | ICD-10-CM | POA: Diagnosis not present

## 2017-12-17 DIAGNOSIS — G4733 Obstructive sleep apnea (adult) (pediatric): Secondary | ICD-10-CM | POA: Diagnosis not present

## 2018-01-16 DIAGNOSIS — G4733 Obstructive sleep apnea (adult) (pediatric): Secondary | ICD-10-CM | POA: Diagnosis not present

## 2018-01-21 DIAGNOSIS — W19XXXA Unspecified fall, initial encounter: Secondary | ICD-10-CM

## 2018-01-21 HISTORY — DX: Unspecified fall, initial encounter: W19.XXXA

## 2018-02-01 DIAGNOSIS — R7302 Impaired glucose tolerance (oral): Secondary | ICD-10-CM | POA: Diagnosis not present

## 2018-02-01 DIAGNOSIS — Z136 Encounter for screening for cardiovascular disorders: Secondary | ICD-10-CM | POA: Diagnosis not present

## 2018-02-01 DIAGNOSIS — Z13220 Encounter for screening for lipoid disorders: Secondary | ICD-10-CM | POA: Diagnosis not present

## 2018-02-01 DIAGNOSIS — Z6841 Body Mass Index (BMI) 40.0 and over, adult: Secondary | ICD-10-CM | POA: Diagnosis not present

## 2018-02-01 DIAGNOSIS — G4700 Insomnia, unspecified: Secondary | ICD-10-CM | POA: Diagnosis not present

## 2018-02-01 DIAGNOSIS — Z0000 Encounter for general adult medical examination without abnormal findings: Secondary | ICD-10-CM | POA: Diagnosis not present

## 2018-02-12 ENCOUNTER — Emergency Department (HOSPITAL_COMMUNITY)
Admission: EM | Admit: 2018-02-12 | Discharge: 2018-02-12 | Disposition: A | Discharge status: Home / Self Care | Payer: BLUE CROSS/BLUE SHIELD | Attending: Emergency Medicine | Admitting: Emergency Medicine

## 2018-02-12 ENCOUNTER — Encounter (HOSPITAL_COMMUNITY): Payer: Self-pay

## 2018-02-12 ENCOUNTER — Emergency Department (HOSPITAL_COMMUNITY): Payer: BLUE CROSS/BLUE SHIELD

## 2018-02-12 DIAGNOSIS — Z7901 Long term (current) use of anticoagulants: Secondary | ICD-10-CM | POA: Insufficient documentation

## 2018-02-12 DIAGNOSIS — I10 Essential (primary) hypertension: Secondary | ICD-10-CM | POA: Diagnosis not present

## 2018-02-12 DIAGNOSIS — Z79899 Other long term (current) drug therapy: Secondary | ICD-10-CM | POA: Insufficient documentation

## 2018-02-12 DIAGNOSIS — S8002XA Contusion of left knee, initial encounter: Secondary | ICD-10-CM | POA: Diagnosis not present

## 2018-02-12 DIAGNOSIS — Y9301 Activity, walking, marching and hiking: Secondary | ICD-10-CM | POA: Insufficient documentation

## 2018-02-12 DIAGNOSIS — Z8673 Personal history of transient ischemic attack (TIA), and cerebral infarction without residual deficits: Secondary | ICD-10-CM | POA: Diagnosis not present

## 2018-02-12 DIAGNOSIS — Y998 Other external cause status: Secondary | ICD-10-CM | POA: Insufficient documentation

## 2018-02-12 DIAGNOSIS — R0902 Hypoxemia: Secondary | ICD-10-CM | POA: Diagnosis not present

## 2018-02-12 DIAGNOSIS — Z91040 Latex allergy status: Secondary | ICD-10-CM | POA: Diagnosis not present

## 2018-02-12 DIAGNOSIS — E119 Type 2 diabetes mellitus without complications: Secondary | ICD-10-CM | POA: Insufficient documentation

## 2018-02-12 DIAGNOSIS — S022XXA Fracture of nasal bones, initial encounter for closed fracture: Secondary | ICD-10-CM | POA: Insufficient documentation

## 2018-02-12 DIAGNOSIS — J45909 Unspecified asthma, uncomplicated: Secondary | ICD-10-CM | POA: Insufficient documentation

## 2018-02-12 DIAGNOSIS — I480 Paroxysmal atrial fibrillation: Secondary | ICD-10-CM | POA: Diagnosis not present

## 2018-02-12 DIAGNOSIS — S0993XA Unspecified injury of face, initial encounter: Secondary | ICD-10-CM | POA: Diagnosis not present

## 2018-02-12 DIAGNOSIS — E785 Hyperlipidemia, unspecified: Secondary | ICD-10-CM | POA: Diagnosis not present

## 2018-02-12 DIAGNOSIS — W010XXA Fall on same level from slipping, tripping and stumbling without subsequent striking against object, initial encounter: Secondary | ICD-10-CM | POA: Insufficient documentation

## 2018-02-12 DIAGNOSIS — Y929 Unspecified place or not applicable: Secondary | ICD-10-CM | POA: Insufficient documentation

## 2018-02-12 DIAGNOSIS — W19XXXA Unspecified fall, initial encounter: Secondary | ICD-10-CM | POA: Diagnosis not present

## 2018-02-12 MED ORDER — TETANUS-DIPHTH-ACELL PERTUSSIS 5-2.5-18.5 LF-MCG/0.5 IM SUSP
0.5000 mL | Freq: Once | INTRAMUSCULAR | Status: AC
  Administered 2018-02-12: 0.5 mL via INTRAMUSCULAR
  Filled 2018-02-12: qty 0.5

## 2018-02-12 NOTE — ED Notes (Signed)
Pt ambulated to BR with cane and minimal assist.

## 2018-02-12 NOTE — ED Provider Notes (Signed)
[facility] EMERGENCY DEPARTMENT Provider Note   CSN: [id number] Arrival date & time: 02/12/18  1445     History   Chief Complaint Chief Complaint  Patient presents with  . Fall  . Epistaxis  . Knee Injury    HPI Nicole Cabrera is a 72 y.o. female.  HPI Pt tripped and fell walking to the movies.  She fell forward and landed on her face.  Pt is hurting on her left knee and her nose.  She sustained a bloody nose.  She did not get knocked out.  She denies any neck pain.  No cp or shortness of breath.  Pt was able to get up and walk after her fall. Past Medical History:  Diagnosis Date  . Allergy   . Anxiety    xanax  . Arthritis    shoulders/hips - tx with OTC meds  . Breast cyst 03/1990   pt denies 12/05/13  . Bronchitis   . Cataract   . Cervical tab 1972  . 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 Diabetes mellitus without complication ([place])    stated not diabetic but her AIC has been high so she is monitored closely   . Endometrial polyp 04/2000  . Heart murmur   . Hemorrhoids   . Hypertension   . Kidney stones   . Obesity   . Osteopenia 05/2007  . Paroxysmal atrial fibrillation ([facility])   . Peripheral neuropathy   . PMB (postmenopausal bleeding) 04/2000  . SAB (spontaneous abortion) 1981  . Sleep apnea    CPAP  . Stroke ([place])   . SVD (spontaneous vaginal delivery) 04/1964, 05/1978   x3  . TIA (transient ischemic attack)     Patient Active Problem List   Diagnosis Date Noted  . Hypokalemia 03/19/2017  . HLD (hyperlipidemia) 03/19/2017  . Hematochezia   . Acute blood loss anemia   . Rectal bleeding 03/18/2017  . Acute GI bleeding   . 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 Stroke (cerebrum) ([place]) 01/18/2017  . Intracerebral hemorrhage 01/18/2017  . Elevated troponin 01/17/2017  . Atrial fibrillation ([place]) 10/27/2016  . Obesity 06/20/2015  . Nephrolithiasis 07/09/2014  . Asthma with bronchitis 02/23/2014  . Aphasia  02/27/2013  . ACUTE BRONCHITIS 08/30/2009  . Essential hypertension 02/22/2007  . Obstructive sleep apnea 02/22/2007  . ALLERGY 02/22/2007    Past Surgical History:  Procedure Laterality Date  . CERVICAL DISC SURGERY  2/93  . CESAREAN SECTION  6/83   with BTL  . COLONOSCOPY N/A 03/19/2017   Procedure: COLONOSCOPY;  Surgeon: Doran Stabler, MD;  Location: [facility];  Service: Gastroenterology;  Laterality: N/A;  . COLONOSCOPY W/ POLYPECTOMY    . HYSTEROSCOPY  5/02, 01/2011  . LITHOTRIPSY  08/30/2014   with Dr. Jeffie Pollock at [facility]  . TOTAL KNEE ARTHROPLASTY Right 6/08   right  . TOTAL KNEE ARTHROPLASTY Left 4/10   left   . WRIST SURGERY Left 4/07   left     OB History    Gravida  5   Para  3   Term      Preterm      AB  2   Living  3     SAB  1   TAB  1   Ectopic      Multiple      Live Births               Home Medications    Prior  to Admission medications   Medication Sig Start Date End Date Taking? Authorizing Provider  cholecalciferol (VITAMIN D) 1000 UNITS tablet Take 1,000 Units by mouth every morning.     [provider]  ELIQUIS 5 MG TABS tablet TAKE ONE TABLET BY MOUTH TWICE DAILY. 08/20/17   Arnoldo Lenis, MD  guaiFENesin (MUCINEX) 600 MG 12 hr tablet Take 600 mg by mouth 2 (two) times daily as needed.    [provider]  losartan-hydrochlorothiazide (HYZAAR) 100-25 MG per tablet Take 1 tablet by mouth every morning.     [provider]  metoprolol tartrate (LOPRESSOR) 25 MG tablet Take 1 tablet (25 mg total) by mouth 2 (two) times daily. May take an extra tablet as needed for palpitations 10/29/17   Erma Heritage, PA-C  Misc. Devices MISC by Does not apply route at bedtime. CPAP    [provider]  Multiple Vitamins-Minerals (MULTIVITAMIN WITH MINERALS) tablet Take 1 tablet by mouth every morning. Centrum Silver Womens 50+    [provider]  Polyethyl Glycol-Propyl Glycol (SYSTANE  OP) Place 1 drop into both eyes as needed (dry eyes).    [provider]  potassium chloride SA (K-DUR,KLOR-CON) 20 MEQ tablet TAKE ONE TABLET BY MOUTH DAILY. 08/20/17   Arnoldo Lenis, MD  PROAIR HFA 108 [phone] Base) MCG/ACT inhaler INHALE TWO PUFFS BY MOUTH EVERY 6 HOURS AS NEEDED FOR WHEEZING AND SHORTNESS OF BREATH 11/16/17   Deneise Lever, MD    Family History Family History  Problem Relation Age of Onset  . Heart disease Father   . Colon cancer Mother   . Heart disease Mother   . Breast cancer Maternal Aunt   . Colon cancer Maternal Aunt   . Colon cancer Maternal Uncle   . Ehlers-Danlos syndrome Other        neice-being tested 4/15  . Stomach cancer Neg Hx   . Pancreatic cancer Neg Hx     Social History Social History   Tobacco Use  . Smoking status: Never Smoker  . Smokeless tobacco: Never Used  Substance Use Topics  . Alcohol use: No    Alcohol/week: 0.0 standard drinks    Comment: maybe once a month  . Drug use: No     Allergies   Bactrim [sulfamethoxazole-trimethoprim]; Ciprofloxacin; Hydroxyzine hcl; Keflet [cephalexin]; Lipitor [atorvastatin]; Lisinopril; Spironolactone; Vistaril [hydroxyzine hcl]; Erythromycin; Latex; Penicillins; Tape; and Tetracycline   Review of Systems Review of Systems  All other systems reviewed and are negative.    Physical Exam Updated Vital Signs BP (!) 142/78   Pulse 71   Temp 97.7 F (36.5 C) (Oral)   Resp 14   LMP 01/22/2011   SpO2 97%   Physical Exam  Constitutional: She appears well-developed and well-nourished. No distress.  HENT:  Head: Normocephalic and atraumatic.  Right Ear: External ear normal.  Left Ear: External ear normal.  Dried blood tip of nose, ttp bridge of nose, wound was cleaned and irrigated and there is no evidence of nasal laceration or septal hematoma  Eyes: Conjunctivae are normal. Right eye exhibits no discharge. Left eye exhibits no discharge. No scleral icterus.  Neck: Neck  supple. No tracheal deviation present.  Cardiovascular: Normal rate, regular rhythm and intact distal pulses.  Pulmonary/Chest: Effort normal and breath sounds normal. No stridor. No respiratory distress. She has no wheezes. She has no rales.  Abdominal: Soft. Bowel sounds are normal. She exhibits no distension. There is no tenderness. There is no rebound and  no guarding.  Musculoskeletal: She exhibits no edema.       Left knee: Tenderness found.       Cervical back: Normal.       Thoracic back: Normal.       Lumbar back: Normal.  brusining and ttp left knee, no hip ttp  Neurological: She is alert. She has normal strength. No cranial nerve deficit (no facial droop, extraocular movements intact, no slurred speech) or sensory deficit. She exhibits normal muscle tone. She displays no seizure activity. Coordination normal.  Skin: Skin is warm and dry. No rash noted.  Psychiatric: She has a normal mood and affect.  Nursing note and vitals reviewed.    ED Treatments / Results  Labs (all labs ordered are listed, but only abnormal results are displayed) Labs Reviewed - No data to display  EKG None  Radiology Dg Nasal Bones  Result Date: 02/12/2018 CLINICAL DATA:  72 year old who fell onto her face earlier today. Epistaxis arising from both nostrils currently. Initial encounter. EXAM: NASAL BONES - 3+ VIEW COMPARISON:  CT head 01/18/2017 and earlier. FINDINGS: Nondisplaced fracture involving the tip of the nasal bones. No displaced fractures are identified. Gas bubbles are present in the soft tissues of the nose likely related to a laceration. IMPRESSION: Nondisplaced fracture involving the tip of the nasal bones. Electronically Signed   By: Evangeline Dakin M.D.   On: 02/12/2018 17:38   Ct Head Wo Contrast  Result Date: 02/12/2018 CLINICAL DATA:  72 year old who tripped at outside of a movie theater earlier today and fell on her face. No loss of consciousness. Initial encounter. EXAM: CT  HEAD WITHOUT CONTRAST TECHNIQUE: Contiguous axial images were obtained from the base of the skull through the vertex without intravenous contrast. COMPARISON:  CT head 01/18/2017, 01/17/2017 and earlier. MRI brain 01/18/2017 and earlier. FINDINGS: Brain: Ventricular system normal in size and appearance for age. Mild age related cortical atrophy, unchanged. Mild-to-moderate changes of small vessel disease of the white matter diffusely, unchanged. Remote LEFT POSTERIOR parietal cortical stroke with focal encephalomalacia. No mass lesion. No midline shift. No acute hemorrhage or hematoma. No extra-axial fluid collections. No evidence of acute infarction. Vascular: Minimal BILATERAL carotid siphon and mild BILATERAL vertebral artery atherosclerosis. No hyperdense vessel. Skull: No skull fracture or other focal osseous abnormality involving the skull. Sinuses/Orbits: Visualized paranasal sinuses, bilateral mastoid air cells and bilateral middle ear cavities well-aerated. Visualized orbits and globes normal in appearance. Other: The nondisplaced fracture involving the tip of the nasal bone is noted as identified on the earlier x-rays. IMPRESSION: 1. No acute intracranial abnormality. 2. Remote focal cortical stroke involving the LEFT POSTERIOR parietal lobe. 3. Stable mild to moderate chronic microvascular ischemic changes of the white matter. Electronically Signed   By: Evangeline Dakin M.D.   On: 02/12/2018 19:04   Dg Knee Complete 4 Views Left  Result Date: 02/12/2018 CLINICAL DATA:  Fall, anterior bruising of the left knee. EXAM: LEFT KNEE - COMPLETE 4+ VIEW COMPARISON:  Non FINDINGS: Total knee prosthesis observed without appreciable periprosthetic fracture or other complicating feature. No definite knee effusion. There is some infiltrative subcutaneous edema in the soft tissues anterior to the knee. IMPRESSION: 1. Subcutaneous edema anterior to the knee. No fracture or acute bony findings. Total knee prosthesis  observed. Electronically Signed   By: Van Clines M.D.   On: 02/12/2018 17:37    Procedures Procedures (including critical care time)  Medications Ordered in ED Medications  Tdap (BOOSTRIX) injection 0.5 mL (0.5  mLs Intramuscular Given 02/12/18 1720)     Initial Impression / Assessment and Plan / ED Course  I have reviewed the triage vital signs and the nursing notes.  Pertinent labs & imaging results that were available during my care of the patient were reviewed by me and considered in my medical decision making (see chart for details).  Clinical Course as of Feb 12 1925  Sat Feb 12, 2018  1925 X-rays reviewed.  No evidence of knee fracture.  CT the head negative for acute injury.  Nondisplaced fracture at the tip of the nasal bone.   [JK]    Clinical Course User Index [JK] Dorie Rank, MD  Patient does not have any evidence of serious injury on her x-rays.   Patient did ask about stopping her Eliquis for 24 hours.  I think this is reasonable to help reduce bruising.  She is on Eliquis for atrial fibrillation.  Plan on discharge home.  Discussed use of ice and over-the-counter medications as needed.  Patient's nondisplaced nasal fracture should not require any ENT treatment.  Final Clinical Impressions(s) / ED Diagnoses   Final diagnoses:  Closed fracture of nasal bone, initial encounter  Contusion of left knee, initial encounter    ED Discharge Orders    None       Dorie Rank, MD 02/12/18 1927

## 2018-02-12 NOTE — ED Triage Notes (Signed)
To room via EMS.  Pt was outside of movie theater, tripped, fell on face.  Nose bleed controlled.  C/o pain to nose and left knee.  Bruising to bilateral knees.  Swelling to left knee.  Pt uses cane.

## 2018-02-12 NOTE — Discharge Instructions (Signed)
Take over the counter medications as needed for pain, you can hold your eliquis for the next 24 hours to help reduce bruising, bleeding

## 2018-02-16 DIAGNOSIS — G4733 Obstructive sleep apnea (adult) (pediatric): Secondary | ICD-10-CM | POA: Diagnosis not present

## 2018-02-24 ENCOUNTER — Encounter: Payer: Self-pay | Admitting: Cardiology

## 2018-03-03 ENCOUNTER — Other Ambulatory Visit: Payer: Self-pay

## 2018-03-03 MED ORDER — APIXABAN 5 MG PO TABS: 5.0000 mg | ORAL_TABLET | Freq: Two times a day (BID) | ORAL | 6 refills | Status: DC

## 2018-03-03 MED ORDER — POTASSIUM CHLORIDE CRYS ER 20 MEQ PO TBCR: 20.0000 meq | EXTENDED_RELEASE_TABLET | Freq: Every day | ORAL | 6 refills | Status: DC

## 2018-03-03 MED ORDER — METOPROLOL TARTRATE 25 MG PO TABS: 25.0000 mg | ORAL_TABLET | Freq: Two times a day (BID) | ORAL | 6 refills | Status: DC

## 2018-03-15 ENCOUNTER — Other Ambulatory Visit: Payer: Self-pay | Admitting: Cardiology

## 2018-03-18 ENCOUNTER — Other Ambulatory Visit: Payer: Self-pay

## 2018-03-18 ENCOUNTER — Ambulatory Visit: Payer: BLUE CROSS/BLUE SHIELD | Admitting: Obstetrics & Gynecology

## 2018-03-18 DIAGNOSIS — G4733 Obstructive sleep apnea (adult) (pediatric): Secondary | ICD-10-CM | POA: Diagnosis not present

## 2018-03-18 MED ORDER — METOPROLOL TARTRATE 25 MG PO TABS: 25.0000 mg | ORAL_TABLET | Freq: Two times a day (BID) | ORAL | 3 refills | Status: DC

## 2018-03-18 NOTE — Telephone Encounter (Signed)
Refilled lopressor

## 2018-03-24 ENCOUNTER — Other Ambulatory Visit: Payer: Self-pay | Admitting: *Deleted

## 2018-03-24 MED ORDER — METOPROLOL TARTRATE 25 MG PO TABS: 25.0000 mg | ORAL_TABLET | Freq: Two times a day (BID) | ORAL | 3 refills | Status: DC

## 2018-04-18 DIAGNOSIS — G4733 Obstructive sleep apnea (adult) (pediatric): Secondary | ICD-10-CM | POA: Diagnosis not present

## 2018-04-20 DIAGNOSIS — R6889 Other general symptoms and signs: Secondary | ICD-10-CM | POA: Diagnosis not present

## 2018-04-20 DIAGNOSIS — Z6841 Body Mass Index (BMI) 40.0 and over, adult: Secondary | ICD-10-CM | POA: Diagnosis not present

## 2018-05-09 ENCOUNTER — Encounter: Payer: Self-pay | Admitting: Adult Health

## 2018-05-09 ENCOUNTER — Ambulatory Visit (INDEPENDENT_AMBULATORY_CARE_PROVIDER_SITE_OTHER): Payer: BLUE CROSS/BLUE SHIELD | Admitting: Adult Health

## 2018-05-09 VITALS — BP 156/80 | HR 66 | Ht 59.0 in | Wt 249.7 lb

## 2018-05-09 DIAGNOSIS — E785 Hyperlipidemia, unspecified: Secondary | ICD-10-CM | POA: Diagnosis not present

## 2018-05-09 DIAGNOSIS — I480 Paroxysmal atrial fibrillation: Secondary | ICD-10-CM | POA: Diagnosis not present

## 2018-05-09 DIAGNOSIS — I10 Essential (primary) hypertension: Secondary | ICD-10-CM | POA: Diagnosis not present

## 2018-05-09 DIAGNOSIS — I6349 Cerebral infarction due to embolism of other cerebral artery: Secondary | ICD-10-CM | POA: Diagnosis not present

## 2018-05-09 NOTE — Progress Notes (Signed)
[facility] [address] [place]. [place] [address] [phone]       OFFICE FOLLOW UP NOTE  [name] Date of Birth:  August 23, 1945 Medical Record Number:  [id number]   Reason for Referral:  Hospital stroke follow up  CHIEF COMPLAINT:  Chief Complaint  Patient presents with  . 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 History obtained from patient and chart review. Reviewed all radiology images and labs personally. Nicole Cabrera is a 73 year old female with PMH of HTN, PAF, HLD, OSA on CPAP and morbid obesity who was admitted to [facility] for slurred speech/word finding difficulties, HA, right visual field  cut and right sided weakness. Patient used Tele Neurology at [facility] hospital and was transferred to [facility] for evaluation of stroke. CT negative for hemorrhage. MRI reviewed and showed acute small right paretal infarct and subacute left patchy parietal infarct with petechial hemorrhage. CTA negative for intracranial stenosis. 2D Echo negative for PFO or cardiac source of embolis and showed LVEF 55-60%. LDL 99 and HgbA1c 6.3. Prior to admission patient was on aspirin 81mg .  Patient was diagnosed with atrial fibrillation in June 2018. And was prescribed eliquis but Patient opted to hold off on anticoagulation due to upcoming colonoscopy and did not restart her eliquis after the procedure.  Patient started on Eliquis during hospitalization in October 2018 for stroke.  Patient also discharged on Lipitor 40 mg.  Patient discharged home in stable condition.            05/06/16 visit: Since discharge, patient has been doing well overall.  Patient is accompanied at today's visit by her husband.  Patient continues to take Eliquis for atrial fibrillation and denies side  effects of increased bleeding or bruising.  Patient did have hospitalization on 03/18/17 for rectal bleeding which was found to be related to recent polypectomy.  Patient recovered well and was restarted on Eliquis at discharge.  Patient continues to take Lipitor 40 mg but is complaining of increased muscle and joint pains.  Patient has a history of OSA and does use CPAP nightly.  Patient does complain that she feels her memory has worsened since his stroke but does have a history of memory issues prior to the stroke.  Blood pressure at today's visit is 150/77.  Patient states she does check this at home and typically runs 140/70-80.  Patient denies new or worsening stroke/TIA symptoms.  Visit 11/03/17: Patient is being seen today for scheduled follow-up visit and is accompanied by her son.  She continues to do well from a stroke standpoint.  She continues to take Eliquis without side effects of bleeding or bruising.  She did take decreased dose of Lipitor for 1 month but due to continued myalgias she stopped taking this medication.  Muscle cramping and pain subsided when she stopped taking Lipitor.  She has not had any repeat labs by PCP but states this will be repeated in November at follow-up appointment.  Patient declined to repeat labs at today's visit.  Recommend that if LDL greater than 70 to start statin for HLD management such as Crestor.  Blood pressure today elevated at 167/86 and patient states she does monitor this at home and typical SBP 1 50-1 60.  Patient states she has been struggling attempting to lower blood pressure at her last appointment with PCP it was agreed upon for patient to try to increase exercise and activity along with weight loss prior to adjusting medications.  Patient states she has been unable to manage weight loss at this time.  She does continue to use CPAP for OSA management.  She does complain of pain in bilateral hands which have been present for the past couple years but  have been worsening.  Recommended PCP follow-up regarding this complaint.  Patient also has short-term memory loss complaints which she states became worse when she had the stroke but has been stable since.  Recommended doing memory exercises but patient declined stating "I just do not feel like doing anything".  Denies new or worsening stroke/TIA symptoms.  Interval History 05/09/18: Nicole Cabrera is being seen today for stroke follow-up.  She continues to do well from a stroke standpoint without residual deficits or recurring of symptoms.  She continues on Eliquis without side effects of bleeding or bruising.  After prior visit, atorvastatin discontinued due to statin myalgias and does states she had lipid panel repeated by PCP with satisfactory levels.  Blood pressure today 156/80 but does monitor at home and typically 140s/80s.  She continues compliance with CPAP for OSA management.  Denies new or worsening stroke/TIA symptoms.   ROS:   14 system review of systems performed and negative with exception of cough, incontinence of bowels, apnea, joint pain, back pain, walking difficulty, moles, itching and memory loss.  PMH:  Past Medical History:  Diagnosis Date  . Allergy   . Anxiety    xanax  . Arthritis    shoulders/hips - tx with OTC meds  . Breast cyst 03/1990   pt denies 12/05/13  . Bronchitis   . Cataract   . Cervical tab 1972  . 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 Diabetes mellitus without complication ([place])    stated not diabetic but her AIC has been high so she is monitored closely   . Endometrial polyp 04/2000  . Heart murmur   . Hemorrhoids   . Hypertension   . Kidney stones   . Obesity   . Osteopenia 05/2007  . Paroxysmal atrial fibrillation ([facility])   . Peripheral neuropathy   . PMB (postmenopausal bleeding) 04/2000  . SAB (spontaneous abortion) 1981  . Sleep apnea    CPAP  . Stroke ([place])   . SVD (spontaneous vaginal delivery) 04/1964, 05/1978   x3  . TIA  (transient ischemic attack)     PSH:  Past Surgical History:  Procedure Laterality Date  . CERVICAL DISC SURGERY  2/93  . CESAREAN SECTION  6/83   with BTL  . COLONOSCOPY N/A 03/19/2017   Procedure: COLONOSCOPY;  Surgeon: Doran Stabler, MD;  Location: [place];  Service: Gastroenterology;  Laterality: N/A;  . COLONOSCOPY W/ POLYPECTOMY    . HYSTEROSCOPY  5/02, 01/2011  . LITHOTRIPSY  08/30/2014   with Dr. Jeffie Pollock at [facility]  . TOTAL KNEE ARTHROPLASTY Right 6/08   right  . TOTAL KNEE ARTHROPLASTY Left 4/10   left   . WRIST SURGERY Left 4/07   left    Social History:  Social History   Socioeconomic History  . Marital status: Married    Spouse name: [name]  . Number of children: 3  . Years of education: college  . Highest education level: Not on file  Occupational History  . Occupation: homemaker  Employer: UNEMPLOYED    Comment: Home maker  Social Needs  . Financial resource strain: Not on file  . Food insecurity:    Worry: Not on file    Inability: Not on file  . Transportation needs:    Medical: Not on file    Non-medical: Not on file  Tobacco Use  . Smoking status: Never Smoker  . Smokeless tobacco: Never Used  Substance and Sexual Activity  . Alcohol use: No    Alcohol/week: 0.0 standard drinks    Comment: maybe once a month  . Drug use: No  . Sexual activity: Never    Partners: Male    Birth control/protection: Surgical    Comment: BTL  Lifestyle  . Physical activity:    Days per week: Not on file    Minutes per session: Not on file  . Stress: Not on file  Relationships  . Social connections:    Talks on phone: Not on file    Gets together: Not on file    Attends religious service: Not on file    Active member of club or organization: Not on file    Attends meetings of clubs or organizations: Not on file    Relationship status: Not on file  . Intimate partner violence:    Fear of current or ex partner: Not on file    Emotionally  abused: Not on file    Physically abused: Not on file    Forced sexual activity: Not on file  Other Topics Concern  . Not on file  Social History Narrative   Patient is married [name])   Patient has three children.   Patient is a homemaker.   Education- College   Right handed.   Caffeine- one coke cola           Family History:  Family History  Problem Relation Age of Onset  . Heart disease Father   . Colon cancer Mother   . Heart disease Mother   . Breast cancer Maternal Aunt   . Colon cancer Maternal Aunt   . Colon cancer Maternal Uncle   . Ehlers-Danlos syndrome Other        neice-being tested 4/15  . Stomach cancer Neg Hx   . Pancreatic cancer Neg Hx     Medications:   Current Outpatient Medications on File Prior to Visit  Medication Sig Dispense Refill  . ALPRAZolam (XANAX) 0.25 MG tablet Take by mouth.    Marland Kitchen apixaban (ELIQUIS) 5 MG TABS tablet Take 1 tablet (5 mg total) by mouth 2 (two) times daily. 60 tablet 6  . cholecalciferol (VITAMIN D) 1000 UNITS tablet Take 1,000 Units by mouth every morning.     Marland Kitchen guaiFENesin (MUCINEX) 600 MG 12 hr tablet Take 600 mg by mouth 2 (two) times daily as needed.    Marland Kitchen losartan-hydrochlorothiazide (HYZAAR) 100-25 MG per tablet Take 1 tablet by mouth every morning.     . metoprolol tartrate (LOPRESSOR) 25 MG tablet Take 1 tablet (25 mg total) by mouth 2 (two) times daily. May take an extra tablet as needed for palpitations 270 tablet 3  . Misc. Devices MISC by Does not apply route at bedtime. CPAP    . Multiple Vitamins-Minerals (MULTIVITAMIN WITH MINERALS) tablet Take 1 tablet by mouth every morning. Centrum Silver Womens 50+    . Polyethyl Glycol-Propyl Glycol (SYSTANE OP) Place 1 drop into both eyes as needed (dry eyes).    . potassium chloride SA (K-DUR,KLOR-CON) 20 MEQ tablet  TAKE ONE TABLET BY MOUTH DAILY. 30 tablet 6  . PROAIR HFA 108 (90 Base) MCG/ACT inhaler INHALE TWO PUFFS BY MOUTH EVERY 6 HOURS AS NEEDED FOR WHEEZING AND  SHORTNESS OF BREATH 8.5 g 3   No current facility-administered medications on file prior to visit.     Allergies:   Allergies  Allergen Reactions  . Bactrim [Sulfamethoxazole-Trimethoprim] Other (See Comments)    Unknown   . Ciprofloxacin Other (See Comments)    GI upset  . Hydroxyzine Hcl     Other reaction(s): Other (See Comments) Pt becomes physchotic.  Marland Kitchen Keflet [Cephalexin] Other (See Comments)    GI upset  . Lipitor [Atorvastatin]     Muscle cramps  . Lisinopril Cough  . Spironolactone Nausea Only  . Vistaril [Hydroxyzine Hcl] Other (See Comments)    Pt becomes physchotic.  Marland Kitchen Erythromycin Rash  . Latex Rash  . Penicillins Rash    Has patient had a PCN reaction causing immediate rash, facial/tongue/throat swelling, SOB or lightheadedness with hypotension: Yes Has patient had a PCN reaction causing severe rash involving mucus membranes or skin necrosis: No Has patient had a PCN reaction that required hospitalization No Has patient had a PCN reaction occurring within the last 10 years: No If all of the above answers are "NO", then may proceed with Cephalosporin use.   . Tape Itching, Rash and Other (See Comments)    Burning Burning  . Tetracycline Rash    Physical Exam  Vitals:   05/09/18 1551  BP: (!) 156/80  Pulse: 66  Weight: 249 lb 11.2 oz (113.3 kg)  Height: 4\' 11"  (1.499 m)   Body mass index is 50.43 kg/m. No exam data present  General: elderly caucasian obese female, obese, seated, in no evident distress Head: head normocephalic and atraumatic.   Neck: supple with no carotid or supraclavicular bruits Cardiovascular: regular rate and rhythm, no murmurs Musculoskeletal: no deformity Skin:  no rash/petichiae Vascular:  Normal pulses all extremities  Neurologic Exam Mental Status: Awake and fully alert. Oriented to place and time. Remote memory intact. Attention span, concentration and fund of knowledge appropriate. Mood and affect appropriate.    Cranial Nerves: Pupils equal, briskly reactive to light. Extraocular movements full without nystagmus. Visual fields full to confrontation. Hearing intact. Facial sensation intact. Face, tongue, palate moves normally and symmetrically.  Motor: Normal bulk and tone. Normal strength in all tested extremity muscles. Sensory: intact to touch , pinprick , position and vibratory sensation.  Coordination: Rapid alternating movements normal in all extremities. Finger-to-nose and heel-to-shin performed accurately bilaterally. Gait and Station: Arises from chair without difficulty. Stance is normal. Gait demonstrates broad-based gait with assistance of cane Reflexes: 1+ and symmetric. Toes downgoing.       ASSESSMENT: 73 y.o. year old female here with right parietal infarct and subacute left parietal infarct on 01/18/2017 secondary to embolis due to atrial fibrillation on aspirin 81mg . Vascular risk factors are HTN, HLD, obesity, sleep apnea and atrial fibrillation.  She is being seen today for follow-up visit and overall has been stable from a stroke standpoint without residual deficits or recurring of symptoms.   PLAN:  -Continue Eliquis (apixaban) daily  for secondary stroke prevention -F/u with PCP regarding your HLD and HTN management -recommended continue follow-up with PCP for lipid panel monitoring and need of initiating statin for HDL management if LDL greater than 70 -she remains hesitant on initiating statin and did discuss use of omega-3 and patient plans on continuing following up with  PCP -f/u with cardiologist as scheduled for atrial fibrillation and Eliquis management -continue to monitor BP at home -Encouraged to increase activity and daily exercise along with maintaining a healthy diet -Maintain strict control of hypertension with blood pressure goal below 130/90, diabetes with hemoglobin A1c goal below 6.5% and cholesterol with LDL cholesterol (bad cholesterol) goal below 70 mg/dL. I  also advised the patient to eat a healthy diet with plenty of whole grains, cereals, fruits and vegetables, exercise regularly and maintain ideal body weight.  As she has been stable from a stroke standpoint without residual deficits or recurring of symptoms.  She will follow-up on an as-needed basis but did advise her to call office with any questions, concerns or need of future follow-up appointment  Greater than 50% of time during this 25  minute visit was spent on counseling,explanation of diagnosis, planning of further management of recent stroke, atrial fibrillation, HLD, HTN, and weight loss. Discussion with patient and family and coordination of care and education regarding control of risk factors and importance of anticoagulation medication for atrial fibrillation.  Venancio Poisson, AGNP-BC  [facility] [address] [place], [place] [address]  Phone [phone] Fax [phone] Note: This document was prepared with digital dictation and possible smart phrase technology. Any transcriptional errors that result from this process are unintentional.

## 2018-05-09 NOTE — Patient Instructions (Addendum)
Continue Eliquis (apixaban) daily for secondary stroke prevention  Continue to follow up with PCP regarding cholesterol and blood pressure management   Continue compliance with CPAP for OSA  Continue to monitor blood pressure at home  Maintain strict control of hypertension with blood pressure goal below 130/90, diabetes with hemoglobin A1c goal below 6.5% and cholesterol with LDL cholesterol (bad cholesterol) goal below 70 mg/dL. I also advised the patient to eat a healthy diet with plenty of whole grains, cereals, fruits and vegetables, exercise regularly and maintain ideal body weight.  Followup in the future with me as needed or call earlier if needed       Thank you for coming to see [place] at [facility]. I hope we have been able to provide you high quality care today.  You may receive a patient satisfaction survey over the next few weeks. We would appreciate your feedback and comments so that we may continue to improve ourselves and the health of our patients.

## 2018-05-10 ENCOUNTER — Ambulatory Visit: Payer: BLUE CROSS/BLUE SHIELD | Admitting: Cardiology

## 2018-05-10 NOTE — Progress Notes (Signed)
I agree with the above plan 

## 2018-05-11 ENCOUNTER — Ambulatory Visit (INDEPENDENT_AMBULATORY_CARE_PROVIDER_SITE_OTHER): Payer: BLUE CROSS/BLUE SHIELD | Admitting: Cardiology

## 2018-05-11 ENCOUNTER — Encounter: Payer: Self-pay | Admitting: Cardiology

## 2018-05-11 VITALS — BP 150/74 | HR 74 | Ht 59.5 in | Wt 255.0 lb

## 2018-05-11 DIAGNOSIS — I480 Paroxysmal atrial fibrillation: Secondary | ICD-10-CM

## 2018-05-11 DIAGNOSIS — E782 Mixed hyperlipidemia: Secondary | ICD-10-CM | POA: Diagnosis not present

## 2018-05-11 DIAGNOSIS — I10 Essential (primary) hypertension: Secondary | ICD-10-CM

## 2018-05-11 MED ORDER — LOSARTAN POTASSIUM-HCTZ 100-25 MG PO TABS: 1.0000 | ORAL_TABLET | Freq: Every morning | ORAL | 6 refills | Status: DC

## 2018-05-11 MED ORDER — AMLODIPINE BESYLATE 5 MG PO TABS: 5.0000 mg | ORAL_TABLET | Freq: Every day | ORAL | 6 refills | Status: DC

## 2018-05-11 NOTE — Progress Notes (Signed)
Clinical Summary [name] is a 73 y.o.female seen today for follow up of the following medical problems.  1. Afib - new diagnosis during ER visit 09/14/16  - no recent palpitations  compliant with meds   2. Hyperlipidemia - neurologist lowered statin, now off - muscle cramps/body aches on atorvastatin    - 01/2018 TC 186 TG 112 HDL 45 LDL 119 - remains opposed to statins   3. OSA - uses CPAP machine, followed by Dr Annamaria Boots.    4. HTN - compliant with meds  4. 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 She had refused anticoag prior to that.  - now on eliquis.   5. CAD - 12/2016 coronary CTA: coronary calcium score of 7, mild diffuse nonobstructive disease - elevated trop 12/2016 during admit with cardioembolic stroke, likely embolized to coronaries as well  - denies any recent symptoms  6. Pulmonary nodules - followed by pulm   Past Medical History:  Diagnosis Date  . Allergy   . Anxiety    xanax  . Arthritis    shoulders/hips - tx with OTC meds  . Breast cyst 03/1990   pt denies 12/05/13  . Bronchitis   . Cataract   . Cervical tab 1972  . 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 Diabetes mellitus without complication ([place])    stated not diabetic but her AIC has been high so she is monitored closely   . Endometrial polyp 04/2000  . Heart murmur   . Hemorrhoids   . Hypertension   . Kidney stones   . Obesity   . Osteopenia 05/2007  . Paroxysmal atrial fibrillation ([facility])   . Peripheral neuropathy   . PMB (postmenopausal bleeding) 04/2000  . SAB (spontaneous abortion) 1981  . Sleep apnea    CPAP  . Stroke ([place])   . SVD (spontaneous vaginal delivery) 04/1964, 05/1978   x3  . TIA (transient ischemic attack)      Allergies  Allergen Reactions  . Bactrim [Sulfamethoxazole-Trimethoprim] Other (See Comments)    Unknown   . Ciprofloxacin Other (See Comments)    GI upset  . Hydroxyzine Hcl     Other reaction(s):  Other (See Comments) Pt becomes physchotic.  Marland Kitchen Keflet [Cephalexin] Other (See Comments)    GI upset  . Lipitor [Atorvastatin]     Muscle cramps  . Lisinopril Cough  . Spironolactone Nausea Only  . Vistaril [Hydroxyzine Hcl] Other (See Comments)    Pt becomes physchotic.  Marland Kitchen Erythromycin Rash  . Latex Rash  . Penicillins Rash    Has patient had a PCN reaction causing immediate rash, facial/tongue/throat swelling, SOB or lightheadedness with hypotension: Yes Has patient had a PCN reaction causing severe rash involving mucus membranes or skin necrosis: No Has patient had a PCN reaction that required hospitalization No Has patient had a PCN reaction occurring within the last 10 years: No If all of the above answers are "NO", then may proceed with Cephalosporin use.   . Tape Itching, Rash and Other (See Comments)    Burning Burning  . Tetracycline Rash     Current Outpatient Medications  Medication Sig Dispense Refill  . ALPRAZolam (XANAX) 0.25 MG tablet Take by mouth.    Marland Kitchen apixaban (ELIQUIS) 5 MG TABS tablet Take 1 tablet (5 mg total) by mouth 2 (two) times daily. 60 tablet 6  . cholecalciferol (VITAMIN D) 1000 UNITS tablet Take 1,000 Units by mouth every morning.     Marland Kitchen guaiFENesin (MUCINEX) 600  MG 12 hr tablet Take 600 mg by mouth 2 (two) times daily as needed.    Marland Kitchen losartan-hydrochlorothiazide (HYZAAR) 100-25 MG per tablet Take 1 tablet by mouth every morning.     . metoprolol tartrate (LOPRESSOR) 25 MG tablet Take 1 tablet (25 mg total) by mouth 2 (two) times daily. May take an extra tablet as needed for palpitations 270 tablet 3  . Misc. Devices MISC by Does not apply route at bedtime. CPAP    . Multiple Vitamins-Minerals (MULTIVITAMIN WITH MINERALS) tablet Take 1 tablet by mouth every morning. Centrum Silver Womens 50+    . Polyethyl Glycol-Propyl Glycol (SYSTANE OP) Place 1 drop into both eyes as needed (dry eyes).    . potassium chloride SA (K-DUR,KLOR-CON) 20 MEQ tablet TAKE  ONE TABLET BY MOUTH DAILY. 30 tablet 6  . PROAIR HFA 108 (90 Base) MCG/ACT inhaler INHALE TWO PUFFS BY MOUTH EVERY 6 HOURS AS NEEDED FOR WHEEZING AND SHORTNESS OF BREATH 8.5 g 3   No current facility-administered medications for this visit.      Past Surgical History:  Procedure Laterality Date  . CERVICAL DISC SURGERY  2/93  . CESAREAN SECTION  6/83   with BTL  . COLONOSCOPY N/A 03/19/2017   Procedure: COLONOSCOPY;  Surgeon: Doran Stabler, MD;  Location: [facility];  Service: Gastroenterology;  Laterality: N/A;  . COLONOSCOPY W/ POLYPECTOMY    . HYSTEROSCOPY  5/02, 01/2011  . LITHOTRIPSY  08/30/2014   with Dr. Jeffie Pollock at [facility]  . TOTAL KNEE ARTHROPLASTY Right 6/08   right  . TOTAL KNEE ARTHROPLASTY Left 4/10   left   . WRIST SURGERY Left 4/07   left     Allergies  Allergen Reactions  . Bactrim [Sulfamethoxazole-Trimethoprim] Other (See Comments)    Unknown   . Ciprofloxacin Other (See Comments)    GI upset  . Hydroxyzine Hcl     Other reaction(s): Other (See Comments) Pt becomes physchotic.  Marland Kitchen Keflet [Cephalexin] Other (See Comments)    GI upset  . Lipitor [Atorvastatin]     Muscle cramps  . Lisinopril Cough  . Spironolactone Nausea Only  . Vistaril [Hydroxyzine Hcl] Other (See Comments)    Pt becomes physchotic.  Marland Kitchen Erythromycin Rash  . Latex Rash  . Penicillins Rash    Has patient had a PCN reaction causing immediate rash, facial/tongue/throat swelling, SOB or lightheadedness with hypotension: Yes Has patient had a PCN reaction causing severe rash involving mucus membranes or skin necrosis: No Has patient had a PCN reaction that required hospitalization No Has patient had a PCN reaction occurring within the last 10 years: No If all of the above answers are "NO", then may proceed with Cephalosporin use.   . Tape Itching, Rash and Other (See Comments)    Burning Burning  . Tetracycline Rash      Family History  Problem Relation Age of Onset  .  Heart disease Father   . Colon cancer Mother   . Heart disease Mother   . Breast cancer Maternal Aunt   . Colon cancer Maternal Aunt   . Colon cancer Maternal Uncle   . Ehlers-Danlos syndrome Other        neice-being tested 4/15  . Stomach cancer Neg Hx   . Pancreatic cancer Neg Hx      Social History Ms. Sossamon reports that she has never smoked. She has never used smokeless tobacco. Ms. Tankard reports no history of alcohol use.   Review of Systems  CONSTITUTIONAL: No weight loss, fever, chills, weakness or fatigue.  HEENT: Eyes: No visual loss, blurred vision, double vision or yellow sclerae.No hearing loss, sneezing, congestion, runny nose or sore throat.  SKIN: No rash or itching.  CARDIOVASCULAR: per hpi RESPIRATORY: No shortness of breath, cough or sputum.  GASTROINTESTINAL: No anorexia, nausea, vomiting or diarrhea. No abdominal pain or blood.  GENITOURINARY: No burning on urination, no polyuria NEUROLOGICAL: No headache, dizziness, syncope, paralysis, ataxia, numbness or tingling in the extremities. No change in bowel or bladder control.  MUSCULOSKELETAL: No muscle, back pain, joint pain or stiffness.  LYMPHATICS: No enlarged nodes. No history of splenectomy.  PSYCHIATRIC: No history of depression or anxiety.  ENDOCRINOLOGIC: No reports of sweating, cold or heat intolerance. No polyuria or polydipsia.  Marland Kitchen   Physical Examination Vitals:   05/11/18 1406  BP: (!) 150/74  Pulse: 74  SpO2: 93%   Vitals:   05/11/18 1406  Weight: 255 lb (115.7 kg)  Height: 4' 11.5" (1.511 m)    Gen: resting comfortably, no acute distress HEENT: no scleral icterus, pupils equal round and reactive, no palptable cervical adenopathy,  CV: RRR, no m/rg, no jvd Resp: Clear to auscultation bilaterally GI: abdomen is soft, non-tender, non-distended, normal bowel sounds, no hepatosplenomegaly MSK: extremities are warm, no edema.  Skin: warm, no rash Neuro:  no focal deficits Psych:  appropriate affect   Diagnostic Studies 09/2016 echo Study Conclusions  - Left ventricle: The cavity size was normal. Wall thickness was increased in a pattern of mild LVH. Systolic function was normal. The estimated ejection fraction was in the range of 60% to 65%. Wall motion was normal; there were no regional wall motion abnormalities. Features are consistent with a pseudonormal left ventricular filling pattern, with concomitant abnormal relaxation and increased filling pressure (grade 2 diastolic dysfunction). - Aortic valve: Mildly calcified annulus. Trileaflet. There was trivial regurgitation. - Mitral valve: Moderately calcified annulus. The findings are consistent with mild stenosis. There was mild regurgitation. Mean gradient (D): 3 mm Hg. Valve area by pressure half-time: 1.54 cm^2. - Left atrium: The atrium was moderately dilated. - Right atrium: Central venous pressure (est): 8 mm Hg. - Tricuspid valve: There was trivial regurgitation. - Pulmonary arteries: Systolic pressure could not be accurately estimated. - Pericardium, extracardiac: There was no pericardial effusion.  Impressions:  - Mild LVH with LVEF 60-65% and grade 2 diastolic dysfunction. Moderate left atrial enlargement. Moderately calcified mitral annulus with evidence of mild mitral regurgitation and mild mitral stenosis. Sclerotic aortic valve with trivial aortic regurgitation. Trivial tricuspid regurgitation.      Assessment and Plan   1. PAF -no recent symptoms -continue current meds  2.HTN -above goal. Start norvasc 5mg  daily, given info on DASH diet - nursing visit 3 weeks for bp check  3. Hyperlipidemia - muscle aches on atorvastatin Resistant to trying other statins    F/u32months      Arnoldo Lenis, M.D.

## 2018-05-11 NOTE — Patient Instructions (Signed)
Medication Instructions:  Start norvasc 5 mg daily   Labwork: none  Testing/Procedures: none  Follow-Up: Your physician recommends that you schedule a follow-up appointment in: 3 weeks for nurse visit - blood pressure    Your physician wants you to follow-up in: 6 monhts.  You will receive a reminder letter in the mail two months in advance. If you don't receive a letter, please call our office to schedule the follow-up appointment.   Any Other Special Instructions Will Be Listed Below (If Applicable).     If you need a refill on your cardiac medications before your next appointment, please call your pharmacy.  DASH Eating Plan DASH stands for "Dietary Approaches to Stop Hypertension." The DASH eating plan is a healthy eating plan that has been shown to reduce high blood pressure (hypertension). It may also reduce your risk for type 2 diabetes, heart disease, and stroke. The DASH eating plan may also help with weight loss. What are tips for following this plan?  General guidelines  Avoid eating more than 2,300 mg (milligrams) of salt (sodium) a day. If you have hypertension, you may need to reduce your sodium intake to 1,500 mg a day.  Limit alcohol intake to no more than 1 drink a day for nonpregnant women and 2 drinks a day for men. One drink equals 12 oz of beer, 5 oz of wine, or 1 oz of hard liquor.  Work with your health care provider to maintain a healthy body weight or to lose weight. Ask what an ideal weight is for you.  Get at least 30 minutes of exercise that causes your heart to beat faster (aerobic exercise) most days of the week. Activities may include walking, swimming, or biking.  Work with your health care provider or diet and nutrition specialist (dietitian) to adjust your eating plan to your individual calorie needs. Reading food labels   Check food labels for the amount of sodium per serving. Choose foods with less than 5 percent of the Daily Value of  sodium. Generally, foods with less than 300 mg of sodium per serving fit into this eating plan.  To find whole grains, look for the word "whole" as the first word in the ingredient list. Shopping  Buy products labeled as "low-sodium" or "no salt added."  Buy fresh foods. Avoid canned foods and premade or frozen meals. Cooking  Avoid adding salt when cooking. Use salt-free seasonings or herbs instead of table salt or sea salt. Check with your health care provider or pharmacist before using salt substitutes.  Do not fry foods. Cook foods using healthy methods such as baking, boiling, grilling, and broiling instead.  Cook with heart-healthy oils, such as olive, canola, soybean, or sunflower oil. Meal planning  Eat a balanced diet that includes: ? 5 or more servings of fruits and vegetables each day. At each meal, try to fill half of your plate with fruits and vegetables. ? Up to 6-8 servings of whole grains each day. ? Less than 6 oz of lean meat, poultry, or fish each day. A 3-oz serving of meat is about the same size as a deck of cards. One egg equals 1 oz. ? 2 servings of low-fat dairy each day. ? A serving of nuts, seeds, or beans 5 times each week. ? Heart-healthy fats. Healthy fats called Omega-3 fatty acids are found in foods such as flaxseeds and coldwater fish, like sardines, salmon, and mackerel.  Limit how much you eat of the following: ? Canned  or prepackaged foods. ? Food that is high in trans fat, such as fried foods. ? Food that is high in saturated fat, such as fatty meat. ? Sweets, desserts, sugary drinks, and other foods with added sugar. ? Full-fat dairy products.  Do not salt foods before eating.  Try to eat at least 2 vegetarian meals each week.  Eat more home-cooked food and less restaurant, buffet, and fast food.  When eating at a restaurant, ask that your food be prepared with less salt or no salt, if possible. What foods are recommended? The items listed  may not be a complete list. Talk with your dietitian about what dietary choices are best for you. Grains Whole-grain or whole-wheat bread. Whole-grain or whole-wheat pasta. Brown rice. [place]row. Bulgur. Whole-grain and low-sodium cereals. Pita bread. Low-fat, low-sodium crackers. Whole-wheat flour tortillas. Vegetables Fresh or frozen vegetables (raw, steamed, roasted, or grilled). Low-sodium or reduced-sodium tomato and vegetable juice. Low-sodium or reduced-sodium tomato sauce and tomato paste. Low-sodium or reduced-sodium canned vegetables. Fruits All fresh, dried, or frozen fruit. Canned fruit in natural juice (without added sugar). Meat and other protein foods Skinless chicken or [place]. Ground chicken or [place]. Pork with fat trimmed off. Fish and seafood. Egg whites. Dried beans, peas, or lentils. Unsalted nuts, nut butters, and seeds. Unsalted canned beans. Lean cuts of beef with fat trimmed off. Low-sodium, lean deli meat. Dairy Low-fat (1%) or fat-free (skim) milk. Fat-free, low-fat, or reduced-fat cheeses. Nonfat, low-sodium ricotta or cottage cheese. Low-fat or nonfat yogurt. Low-fat, low-sodium cheese. Fats and oils Soft margarine without trans fats. Vegetable oil. Low-fat, reduced-fat, or light mayonnaise and salad dressings (reduced-sodium). Canola, safflower, olive, soybean, and sunflower oils. Avocado. Seasoning and other foods Herbs. Spices. Seasoning mixes without salt. Unsalted popcorn and pretzels. Fat-free sweets. What foods are not recommended? The items listed may not be a complete list. Talk with your dietitian about what dietary choices are best for you. Grains Baked goods made with fat, such as croissants, muffins, or some breads. Dry pasta or rice meal packs. Vegetables Creamed or fried vegetables. Vegetables in a cheese sauce. Regular canned vegetables (not low-sodium or reduced-sodium). Regular canned tomato sauce and paste (not low-sodium or reduced-sodium).  Regular tomato and vegetable juice (not low-sodium or reduced-sodium). Angie Fava. Olives. Fruits Canned fruit in a light or heavy syrup. Fried fruit. Fruit in cream or butter sauce. Meat and other protein foods Fatty cuts of meat. Ribs. Fried meat. Berniece Salines. Sausage. Bologna and other processed lunch meats. Salami. Fatback. Hotdogs. Bratwurst. Salted nuts and seeds. Canned beans with added salt. Canned or smoked fish. Whole eggs or egg yolks. Chicken or [place] with skin. Dairy Whole or 2% milk, cream, and half-and-half. Whole or full-fat cream cheese. Whole-fat or sweetened yogurt. Full-fat cheese. Nondairy creamers. Whipped toppings. Processed cheese and cheese spreads. Fats and oils Butter. Stick margarine. Lard. Shortening. Ghee. Bacon fat. Tropical oils, such as coconut, palm kernel, or palm oil. Seasoning and other foods Salted popcorn and pretzels. Onion salt, garlic salt, seasoned salt, table salt, and sea salt. Worcestershire sauce. Tartar sauce. Barbecue sauce. Teriyaki sauce. Soy sauce, including reduced-sodium. Steak sauce. Canned and packaged gravies. Fish sauce. Oyster sauce. Cocktail sauce. Horseradish that you find on the shelf. Ketchup. Mustard. Meat flavorings and tenderizers. Bouillon cubes. Hot sauce and Tabasco sauce. Premade or packaged marinades. Premade or packaged taco seasonings. Relishes. Regular salad dressings. Where to find more information:  National Heart, Lung, and [place]: https://wilson-eaton.com/  American Heart Association: www.heart.org Summary  The  DASH eating plan is a healthy eating plan that has been shown to reduce high blood pressure (hypertension). It may also reduce your risk for type 2 diabetes, heart disease, and stroke.  With the DASH eating plan, you should limit salt (sodium) intake to 2,300 mg a day. If you have hypertension, you may need to reduce your sodium intake to 1,500 mg a day.  When on the DASH eating plan, aim to eat more fresh fruits and  vegetables, whole grains, lean proteins, low-fat dairy, and heart-healthy fats.  Work with your health care provider or diet and nutrition specialist (dietitian) to adjust your eating plan to your individual calorie needs. This information is not intended to replace advice given to you by your health care provider. Make sure you discuss any questions you have with your health care provider. Document Released: 02/26/2011 Document Revised: 03/02/2016 Document Reviewed: 03/02/2016 Elsevier Interactive Patient Education  2019 [employer].

## 2018-05-13 ENCOUNTER — Encounter

## 2018-05-13 ENCOUNTER — Ambulatory Visit: Payer: BLUE CROSS/BLUE SHIELD | Admitting: Obstetrics & Gynecology

## 2018-05-16 ENCOUNTER — Other Ambulatory Visit: Payer: Self-pay | Admitting: Obstetrics & Gynecology

## 2018-05-16 ENCOUNTER — Encounter: Payer: Self-pay | Admitting: Obstetrics & Gynecology

## 2018-05-16 ENCOUNTER — Ambulatory Visit (INDEPENDENT_AMBULATORY_CARE_PROVIDER_SITE_OTHER): Payer: BLUE CROSS/BLUE SHIELD | Admitting: Obstetrics & Gynecology

## 2018-05-16 ENCOUNTER — Other Ambulatory Visit (HOSPITAL_COMMUNITY)
Admission: RE | Admit: 2018-05-16 | Discharge: 2018-05-16 | Disposition: A | Discharge status: Home / Self Care | Payer: BLUE CROSS/BLUE SHIELD | Source: Ambulatory Visit | Attending: Obstetrics & Gynecology | Admitting: Obstetrics & Gynecology

## 2018-05-16 VITALS — BP 168/92 | HR 68 | Resp 16 | Ht 59.0 in | Wt 255.8 lb

## 2018-05-16 DIAGNOSIS — Z01419 Encounter for gynecological examination (general) (routine) without abnormal findings: Secondary | ICD-10-CM

## 2018-05-16 DIAGNOSIS — Z124 Encounter for screening for malignant neoplasm of cervix: Secondary | ICD-10-CM

## 2018-05-16 DIAGNOSIS — Z1231 Encounter for screening mammogram for malignant neoplasm of breast: Secondary | ICD-10-CM

## 2018-05-16 NOTE — Progress Notes (Signed)
73 y.o. [id number] Married White or Caucasian female here for annual exam.  Has experienced several medical issues this past year.  Was seen in January for flu-like symptoms.  She's been around someone who had the flu so she was treated with Tamiflu.  She did nto get a flu shot.    Denies vaginal bleeding.    She still has a small portion of a stone in her kidney.  Still sees urologist.  Neurologist:  Dr. Leonie Man Cardiologist:  Dr. Carlyle Dolly Pulmonologist:  Dr. Annamaria Boots Urologist:  Dr. Jeffie Pollock GI:  Dr. Silverio Decamp  Patient's last menstrual period was 01/22/2011.          Sexually active: No.  The current method of family planning is post menopausal status.    Exercising: No.   Smoker:  no  Health Maintenance: Pap:  11/18/15 neg   06/29/13 Neg  History of abnormal Pap:  no MMG:  10/02/16 BIRADS1:neg  Colonoscopy:  03/19/17 polyp.  Dr. Silverio Decamp.  Follow up 3 years.   BMD:   2014 Normal  TDaP:  02/12/2018 Pneumonia vaccine(s):  2018 Shingrix:   Declines doing this Hep C testing: 11/18/15 Neg  Screening Labs: PCP   reports that she has never smoked. She has never used smokeless tobacco. She reports that she does not drink alcohol or use drugs.  Past Medical History:  Diagnosis Date  . Allergy   . Anxiety    xanax  . Arthritis    shoulders/hips - tx with OTC meds  . Breast cyst 03/1990   pt denies 12/05/13  . Bronchitis   . Cataract   . Cervical tab 1972  . 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 Diabetes mellitus without complication ([place])    stated not diabetic but her AIC has been high so she is monitored closely   . Endometrial polyp 04/2000  . Fall 01/2018   nose fracture   . Heart murmur   . Hemorrhoids   . Hypertension   . Kidney stones   . Obesity   . Osteopenia 05/2007  . Paroxysmal atrial fibrillation (HCC)   . Peripheral neuropathy   . PMB (postmenopausal bleeding) 04/2000  . SAB (spontaneous abortion) 1981  . Sleep apnea    CPAP  . Stroke ([place])   .  SVD (spontaneous vaginal delivery) 04/1964, 05/1978   x3  . TIA (transient ischemic attack)     Past Surgical History:  Procedure Laterality Date  . CERVICAL DISC SURGERY  2/93  . CESAREAN SECTION  6/83   with BTL  . COLONOSCOPY N/A 03/19/2017   Procedure: COLONOSCOPY;  Surgeon: Doran Stabler, MD;  Location: [place];  Service: Gastroenterology;  Laterality: N/A;  . COLONOSCOPY W/ POLYPECTOMY    . HYSTEROSCOPY  5/02, 01/2011  . LITHOTRIPSY  08/30/2014   with Dr. Jeffie Pollock at [facility]  . TOTAL KNEE ARTHROPLASTY Right 6/08   right  . TOTAL KNEE ARTHROPLASTY Left 4/10   left   . WRIST SURGERY Left 4/07   left    Current Outpatient Medications  Medication Sig Dispense Refill  . acetaminophen (TYLENOL) 325 MG tablet Take 650 mg by mouth every 6 (six) hours as needed.    . ALPRAZolam (XANAX) 0.25 MG tablet Take by mouth.    Marland Kitchen apixaban (ELIQUIS) 5 MG TABS tablet Take 1 tablet (5 mg total) by mouth 2 (two) times daily. 60 tablet 6  . cholecalciferol (VITAMIN D) 1000 UNITS tablet Take 1,000 Units by mouth every morning.     Marland Kitchen  guaiFENesin (MUCINEX) 600 MG 12 hr tablet Take 600 mg by mouth 2 (two) times daily as needed.    Marland Kitchen losartan-hydrochlorothiazide (HYZAAR) 100-25 MG tablet Take 1 tablet by mouth every morning. 30 tablet 6  . metoprolol tartrate (LOPRESSOR) 25 MG tablet Take 1 tablet (25 mg total) by mouth 2 (two) times daily. May take an extra tablet as needed for palpitations 270 tablet 3  . Misc. Devices MISC by Does not apply route at bedtime. CPAP    . Multiple Vitamins-Minerals (MULTIVITAMIN WITH MINERALS) tablet Take 1 tablet by mouth every morning. Centrum Silver Womens 50+    . Polyethyl Glycol-Propyl Glycol (SYSTANE OP) Place 1 drop into both eyes as needed (dry eyes).    . potassium chloride SA (K-DUR,KLOR-CON) 20 MEQ tablet TAKE ONE TABLET BY MOUTH DAILY. 30 tablet 6  . PROAIR HFA 108 (90 Base) MCG/ACT inhaler INHALE TWO PUFFS BY MOUTH EVERY 6 HOURS AS NEEDED FOR  WHEEZING AND SHORTNESS OF BREATH 8.5 g 3   No current facility-administered medications for this visit.     Family History  Problem Relation Age of Onset  . Heart disease Father   . Colon cancer Mother   . Heart disease Mother   . Breast cancer Maternal Aunt   . Colon cancer Maternal Aunt   . Colon cancer Maternal Uncle   . Ehlers-Danlos syndrome Other        neice-being tested 4/15  . Stomach cancer Neg Hx   . Pancreatic cancer Neg Hx     Review of Systems  Genitourinary:       Loss of urine spontaneously   All other systems reviewed and are negative.   Exam:   BP (!) 168/92 (BP Location: Right Arm, Patient Position: Sitting, Cuff Size: Large)   Pulse 68   Resp 16   Ht 4\' 11"  (1.499 m)   Wt 255 lb 12.8 oz (116 kg)   LMP 01/22/2011   BMI 51.67 kg/m    Height: 4\' 11"  (149.9 cm)  Ht Readings from Last 3 Encounters:  05/16/18 4\' 11"  (1.499 m)  05/11/18 4' 11.5" (1.511 m)  05/09/18 4\' 11"  (1.499 m)    General appearance: alert, cooperative and appears stated age Head: Normocephalic, without obvious abnormality, atraumatic Neck: no adenopathy, supple, symmetrical, trachea midline and thyroid normal to inspection and palpation Lungs: clear to auscultation bilaterally Breasts: normal appearance, no masses or tenderness Heart: regular rate and rhythm Abdomen: soft, non-tender; bowel sounds normal; no masses,  no organomegaly Extremities: extremities normal, atraumatic, no cyanosis or edema Skin: Skin color, texture, turgor normal. No rashes or lesions Lymph nodes: Cervical, supraclavicular, and axillary nodes normal. No abnormal inguinal nodes palpated Neurologic: Grossly normal   Pelvic: External genitalia:  no lesions              Urethra:  normal appearing urethra with no masses, tenderness or lesions              Bartholins and Skenes: normal                 Vagina: normal appearing vagina with normal color and discharge, no lesions              Cervix: no  lesions              Pap taken: Yes.   Bimanual Exam:  Uterus:  normal size, contour, position, consistency, mobility, non-tender  Adnexa: normal adnexa and no mass, fullness, tenderness               Rectovaginal: Confirms               Anus:  normal sphincter tone, no lesions  Chaperone was present for exam.  A:  Well Woman with normal exam PMP, no HRT obesity Hypertension Afib Diabetes H/o CVA OSA Colon polyps  P:   Mammogram guidelines reviewed.  Aware this is due.  Will schedule for her. pap smear obtained today Blood work done with several other providers Declines shingrix vaccination colonoscopy follow up due 3 years return annually or prn

## 2018-05-16 NOTE — Progress Notes (Signed)
Patient scheduled while in office for screening MMG at [place] on 06/10/18 at 1:40pm. Patient is agreeable to date and time.

## 2018-05-18 LAB — CYTOLOGY - PAP
Adequacy: ABSENT
Diagnosis: NEGATIVE

## 2018-05-19 DIAGNOSIS — G4733 Obstructive sleep apnea (adult) (pediatric): Secondary | ICD-10-CM | POA: Diagnosis not present

## 2018-05-30 ENCOUNTER — Telehealth: Payer: Self-pay | Admitting: Cardiology

## 2018-05-30 NOTE — Telephone Encounter (Signed)
She read in the package insert that Norvasc could cause heart attacks.She is going to speak with the pharmacist at [facility] tomorrow

## 2018-05-30 NOTE — Telephone Encounter (Signed)
Patient cancelled appointment for BP check but wanted a return phone call from nurse. When asked what in regards to she said she had some questions. Asked to be more specific she said questions about her BP medications. / tg

## 2018-05-30 NOTE — Telephone Encounter (Signed)
Patient states that she did some research and stopped taking Norvasc do to the side effects. Pt then canceled her BP check. She has also been reading the [employer] and read there is a study stating that it is better to take BP meds at night. Patient states she will try and call up back.

## 2018-05-30 NOTE — Telephone Encounter (Signed)
What specific side effects did she have on the norvasc? Taking bp meds at night or in the AM is fine with me.   Zandra Abts MD

## 2018-05-31 NOTE — Telephone Encounter (Signed)
Norvasc is a very commonly used medication, it is one of the strongest recommended medications by the [place] [employer] to treat hypertension and generally is very well tolerated.    Carlyle Dolly MD

## 2018-06-01 ENCOUNTER — Ambulatory Visit: Payer: BLUE CROSS/BLUE SHIELD

## 2018-06-02 NOTE — Telephone Encounter (Signed)
Patient still wants to talk with the pharmacist before she will take medication

## 2018-06-10 ENCOUNTER — Inpatient Hospital Stay: Admission: RE | Admit: 2018-06-10 | Payer: BLUE CROSS/BLUE SHIELD | Source: Ambulatory Visit

## 2018-06-15 ENCOUNTER — Telehealth: Payer: Self-pay | Admitting: Cardiology

## 2018-06-15 NOTE — Telephone Encounter (Signed)
Would continue to monitor for now, if significant nose bleeding continues or ongoing rectal bleeding needs to call us back   Zandra Abts MD

## 2018-06-15 NOTE — Telephone Encounter (Signed)
Pt made aware, voiced understanding. 

## 2018-06-15 NOTE — Telephone Encounter (Signed)
Patient wants to speak with nurse regarding episode of rectal bleeding last week. States that this morning she blew her nose and blood came out. States that she is on Eliquis and wants to know if she needs to changes anything. / tg

## 2018-06-15 NOTE — Telephone Encounter (Signed)
Spoke with pt. She states that she had a small amount of bright red blood when she wiped after using restroom last week. Then she also had a spot of bright red blood today when she blew her nose. She was calling to be sure there was no reason to worry. She is not having any abdominal pain or cramping. She is having allergy issues.  Please advise.

## 2018-06-17 DIAGNOSIS — G4733 Obstructive sleep apnea (adult) (pediatric): Secondary | ICD-10-CM | POA: Diagnosis not present

## 2018-07-18 DIAGNOSIS — G4733 Obstructive sleep apnea (adult) (pediatric): Secondary | ICD-10-CM | POA: Diagnosis not present

## 2018-07-28 ENCOUNTER — Ambulatory Visit: Payer: BLUE CROSS/BLUE SHIELD | Admitting: Internal Medicine

## 2018-08-02 DIAGNOSIS — I10 Essential (primary) hypertension: Secondary | ICD-10-CM | POA: Diagnosis not present

## 2018-08-02 DIAGNOSIS — M5137 Other intervertebral disc degeneration, lumbosacral region: Secondary | ICD-10-CM | POA: Diagnosis not present

## 2018-08-17 DIAGNOSIS — G4733 Obstructive sleep apnea (adult) (pediatric): Secondary | ICD-10-CM | POA: Diagnosis not present

## 2018-09-26 ENCOUNTER — Other Ambulatory Visit: Payer: Self-pay | Admitting: Cardiology

## 2018-09-26 MED ORDER — APIXABAN 5 MG PO TABS: 5.0000 mg | ORAL_TABLET | Freq: Two times a day (BID) | ORAL | 6 refills | Status: DC

## 2018-09-26 NOTE — Telephone Encounter (Signed)
Pt called needing refill on apixaban (ELIQUIS) 5 MG TABS tablet [[id number]]  Sent to [facility] in [place].

## 2018-09-26 NOTE — Telephone Encounter (Signed)
Refill complete 

## 2018-09-30 DIAGNOSIS — G4733 Obstructive sleep apnea (adult) (pediatric): Secondary | ICD-10-CM | POA: Diagnosis not present

## 2018-10-11 DIAGNOSIS — R509 Fever, unspecified: Secondary | ICD-10-CM | POA: Diagnosis not present

## 2018-10-11 DIAGNOSIS — G4452 New daily persistent headache (NDPH): Secondary | ICD-10-CM | POA: Diagnosis not present

## 2018-10-11 DIAGNOSIS — J00 Acute nasopharyngitis [common cold]: Secondary | ICD-10-CM | POA: Diagnosis not present

## 2018-10-11 DIAGNOSIS — Z1159 Encounter for screening for other viral diseases: Secondary | ICD-10-CM | POA: Diagnosis not present

## 2018-10-13 ENCOUNTER — Other Ambulatory Visit: Payer: Self-pay | Admitting: Cardiology

## 2018-10-13 NOTE — Telephone Encounter (Signed)
Refilled potassium

## 2018-10-28 ENCOUNTER — Ambulatory Visit: Payer: BLUE CROSS/BLUE SHIELD | Admitting: Urology

## 2018-11-07 DIAGNOSIS — H35033 Hypertensive retinopathy, bilateral: Secondary | ICD-10-CM | POA: Diagnosis not present

## 2018-11-07 DIAGNOSIS — H04123 Dry eye syndrome of bilateral lacrimal glands: Secondary | ICD-10-CM | POA: Diagnosis not present

## 2018-11-07 DIAGNOSIS — H2513 Age-related nuclear cataract, bilateral: Secondary | ICD-10-CM | POA: Diagnosis not present

## 2018-11-07 DIAGNOSIS — E119 Type 2 diabetes mellitus without complications: Secondary | ICD-10-CM | POA: Diagnosis not present

## 2018-11-07 DIAGNOSIS — H2511 Age-related nuclear cataract, right eye: Secondary | ICD-10-CM | POA: Diagnosis not present

## 2018-11-07 DIAGNOSIS — H25013 Cortical age-related cataract, bilateral: Secondary | ICD-10-CM | POA: Diagnosis not present

## 2018-11-23 ENCOUNTER — Encounter: Payer: Self-pay | Admitting: Internal Medicine

## 2018-11-24 ENCOUNTER — Ambulatory Visit (INDEPENDENT_AMBULATORY_CARE_PROVIDER_SITE_OTHER): Payer: BC Managed Care – PPO | Admitting: Internal Medicine

## 2018-11-24 ENCOUNTER — Other Ambulatory Visit: Payer: Self-pay

## 2018-11-24 ENCOUNTER — Encounter: Payer: Self-pay | Admitting: Internal Medicine

## 2018-11-24 ENCOUNTER — Ambulatory Visit (INDEPENDENT_AMBULATORY_CARE_PROVIDER_SITE_OTHER): Payer: BC Managed Care – PPO

## 2018-11-24 VITALS — BP 126/68 | HR 70 | Temp 98.1°F | Ht 59.5 in | Wt 259.2 lb

## 2018-11-24 DIAGNOSIS — I2510 Atherosclerotic heart disease of native coronary artery without angina pectoris: Secondary | ICD-10-CM | POA: Diagnosis not present

## 2018-11-24 DIAGNOSIS — I480 Paroxysmal atrial fibrillation: Secondary | ICD-10-CM

## 2018-11-24 DIAGNOSIS — J45909 Unspecified asthma, uncomplicated: Secondary | ICD-10-CM

## 2018-11-24 DIAGNOSIS — J410 Simple chronic bronchitis: Secondary | ICD-10-CM

## 2018-11-24 DIAGNOSIS — G4733 Obstructive sleep apnea (adult) (pediatric): Secondary | ICD-10-CM | POA: Diagnosis not present

## 2018-11-24 DIAGNOSIS — J40 Bronchitis, not specified as acute or chronic: Secondary | ICD-10-CM | POA: Diagnosis not present

## 2018-11-24 NOTE — Patient Instructions (Signed)
Order- cxr    Dx Bronchitis, CAD  We can continue CPAP auto 10-15, mask of choice, humidifier, supplies, AirView/ card   Please call if we can help

## 2018-11-24 NOTE — Progress Notes (Signed)
HPI female never smoker followed for OSA, complicated by HBP, history of bronchitis, PAFib, CVA NPSG 07/12/85-AHI 50.7/hour, desaturation to 84%  ---------------------------------------------------------------------------------------  07/26/2017- 72 ear-old female never smoker followed for OSA, complicated by HBP, history of bronchitis, PAFib, CVA CPAP 11/ [place]- no download available ----OSA; DME: [facility]-Pt wears CPAP machine but too old to [facility] like to get new machine.  ProAir hfa,  Says she is breathing "fine" with rare need for rescue inhaler.  No sleep disturbance from her breathing. Old CPAP machine-time to replace.  Compliance and control have been good by her description and she continues to benefit. Had self-limited episode of atrial fibrillation, CVA last fall with visual disturbance. CT chest 07/21/2017 1. Basilar pulmonary nodules are perifissural in location, stable and most indicative of subpleural lymph nodes. 2. Aortic atherosclerosis (ICD10-170.0). Coronary artery calcification. 3. Left renal stone.  11/24/2018- 73 yo female never smoker followed for OSA, complicated by HBP, history of bronchitis, PAFib, CVA 2018 CPAP auto 10-15/ [facility]-  Download compliance 100%, AHI 4.1/ hr -----OSA on CPAP, DME: [facility], no complaints, pt brought SD card in for DL Body weight today 259 lbs Proair hfa Machine is 73 yr old and working well.  Declines flu vax.  Rarely needs ProAir Covid tested neg in July for viral URI. Remains careful. CXR 10/06/17 IMPRESSION: Enlargement of cardiac silhouette with pulmonary vascular congestion. Minimal RIGHT basilar atelectasis.  ROS-see HPI + = positive Constitutional:   No-   weight loss, night sweats, fevers, chills, +fatigue, lassitude. HEENT:   No-  headaches, difficulty swallowing, tooth/dental problems, sore throat,       No-  sneezing, itching, ear ache, nasal congestion, post nasal drip,  CV:   No-   chest pain, orthopnea, PND, swelling in lower extremities, anasarca, dizziness, palpitations Resp: +shortness of breath with exertion or at rest.              No-   productive cough,  + non-productive cough,  No- coughing up of blood.              No-   change in color of mucus.  No- wheezing.   Skin: No-   rash or lesions. GI:  No-   heartburn, indigestion, abdominal pain, nausea, vomiting,  GU: . MS:  No-   joint pain or swelling.  Neuro-     nothing unusual Psych:  No- change in mood or affect. No depression or anxiety.  No memory loss.  OBJ   General- Alert, Oriented, Affect-appropriate, Distress- none acute; + morbid obesity Skin- rash-none, lesions- none, excoriation- none Lymphadenopathy- none Head- atraumatic            Eyes- Gross vision intact, PERRLA, conjunctivae clear secretions            Ears- Hearing, canals-normal            Nose- Clear, no-Septal dev, mucus, polyps, erosion, perforation             Throat- Mallampati III , mucosa clear/ not red , drainage- none, tonsils- atrophic Neck- flexible , trachea midline, no stridor , thyroid nl, carotid no bruit Chest - symmetrical excursion , unlabored           Heart/CV- RRR , no murmur , no gallop  , no rub, nl s1 s2                           - JVD-  none , edema- none, stasis changes- none, varices- none           Lung- clear to P&A, wheeze- none, cough+ deep bronchitic , dullness-none, rub- none           Chest wall-  Abd-  Br/ Gen/ Rectal- Not done, not indicated Extrem- cyanosis- none, clubbing, none, atrophy- none, strength- nl, + cane Neuro- grossly intact to observation

## 2018-12-03 NOTE — Assessment & Plan Note (Signed)
Pulse regular or nearly so on exa today Followed by cardiology

## 2018-12-03 NOTE — Assessment & Plan Note (Signed)
Good compliance and control. Continues to benefit. Plan- Continue CPAP auto 10-15

## 2018-12-03 NOTE — Assessment & Plan Note (Signed)
Well controlled at present without recent exacerbation.  Current meds ok.  Encourage flu shot this fall

## 2018-12-07 DIAGNOSIS — H25811 Combined forms of age-related cataract, right eye: Secondary | ICD-10-CM | POA: Diagnosis not present

## 2018-12-07 DIAGNOSIS — H2511 Age-related nuclear cataract, right eye: Secondary | ICD-10-CM | POA: Diagnosis not present

## 2018-12-07 HISTORY — PX: CATARACT EXTRACTION: SUR2

## 2018-12-13 DIAGNOSIS — H25012 Cortical age-related cataract, left eye: Secondary | ICD-10-CM | POA: Diagnosis not present

## 2018-12-13 DIAGNOSIS — H2512 Age-related nuclear cataract, left eye: Secondary | ICD-10-CM | POA: Diagnosis not present

## 2018-12-16 ENCOUNTER — Ambulatory Visit (INDEPENDENT_AMBULATORY_CARE_PROVIDER_SITE_OTHER): Payer: BC Managed Care – PPO | Admitting: Cardiology

## 2018-12-16 ENCOUNTER — Encounter: Payer: Self-pay | Admitting: Cardiology

## 2018-12-16 ENCOUNTER — Other Ambulatory Visit: Payer: Self-pay

## 2018-12-16 VITALS — BP 150/79 | HR 65 | Temp 97.5°F | Ht 59.5 in | Wt 261.0 lb

## 2018-12-16 DIAGNOSIS — I10 Essential (primary) hypertension: Secondary | ICD-10-CM

## 2018-12-16 DIAGNOSIS — Z79899 Other long term (current) drug therapy: Secondary | ICD-10-CM

## 2018-12-16 DIAGNOSIS — I480 Paroxysmal atrial fibrillation: Secondary | ICD-10-CM | POA: Diagnosis not present

## 2018-12-16 DIAGNOSIS — E782 Mixed hyperlipidemia: Secondary | ICD-10-CM

## 2018-12-16 MED ORDER — CHLORTHALIDONE 25 MG PO TABS: 25.0000 mg | ORAL_TABLET | Freq: Every day | ORAL | 3 refills | Status: DC

## 2018-12-16 MED ORDER — LOSARTAN POTASSIUM 100 MG PO TABS: 100.0000 mg | ORAL_TABLET | Freq: Every day | ORAL | 3 refills | Status: DC

## 2018-12-16 NOTE — Progress Notes (Signed)
Clinical Summary Nicole Cabrera is a 73 y.o.female seen today for follow up of the following medical problems.  1. Afib - new diagnosis during ER visit 09/14/16   - no recent palpitations - compliant with meds - can have some hemorridal bleeding, first episode today in some time. Followed by GI.   2. Hyperlipidemia - neurologist lowered statin, now off - muscle cramps/body ac[place]on atorvastatin - she has not been willing to try alternative statin  - 01/2018 TC 186 TG 112 HDL 45 LDL 119    3. OSA - uses CPAP machine, followed by Dr Annamaria Boots.    4. HTN - compliant with meds - home SBPs 150s.   4. 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 She had refused anticoag prior to that. - now on eliquis.  5. CAD - 12/2016 coronary CTA: coronary calcium score of 7, mild diffuse nonobstructive disease - elevated trop 12/2016 during admit with cardioembolic stroke, likely embolized to coronaries as well  - no recent symptoms.   6. Pulmonary nodules - followed by pulm   Past Medical History:  Diagnosis Date  . Allergy   . Anxiety    xanax  . Arthritis    shoulders/hips - tx with OTC meds  . Breast cyst 03/1990   pt denies 12/05/13  . Bronchitis   . Cataract   . Cervical tab 1972  . 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 Diabetes mellitus without complication ([place])    stated not diabetic but her AIC has been high so she is monitored closely   . Endometrial polyp 04/2000  . Fall 01/2018   nose fracture   . Heart murmur   . Hemorrhoids   . Hypertension   . Kidney stones   . Obesity   . Osteopenia 05/2007  . Paroxysmal atrial fibrillation ([facility])   . Peripheral neuropathy   . PMB (postmenopausal bleeding) 04/2000  . SAB (spontaneous abortion) 1981  . Sleep apnea    CPAP  . Stroke ([place])   . SVD (spontaneous vaginal delivery) 04/1964, 05/1978   x3  . TIA (transient ischemic attack)      Allergies  Allergen Reactions  .  Bactrim [Sulfamethoxazole-Trimethoprim] Other (See Comments)    Unknown   . Ciprofloxacin Other (See Comments)    GI upset  . Hydroxyzine Hcl     Other reaction(s): Other (See Comments) Pt becomes physchotic.  Marland Kitchen Keflet [Cephalexin] Other (See Comments)    GI upset  . Lipitor [Atorvastatin]     Muscle cramps  . Lisinopril Cough  . Spironolactone Nausea Only  . Vistaril [Hydroxyzine Hcl] Other (See Comments)    Pt becomes physchotic.  Marland Kitchen Erythromycin Rash  . Latex Rash  . Penicillins Rash    Has patient had a PCN reaction causing immediate rash, facial/tongue/throat swelling, SOB or lightheadedness with hypotension: Yes Has patient had a PCN reaction causing severe rash involving mucus membranes or skin necrosis: No Has patient had a PCN reaction that required hospitalization No Has patient had a PCN reaction occurring within the last 10 years: No If all of the above answers are "NO", then may proceed with Cephalosporin use.   . Tape Itching, Rash and Other (See Comments)    Burning Burning  . Tetracycline Rash     Current Outpatient Medications  Medication Sig Dispense Refill  . acetaminophen (TYLENOL) 325 MG tablet Take 650 mg by mouth every 6 (six) hours as needed.    . ALPRAZolam (XANAX) 0.25 MG tablet  Take by mouth.    Marland Kitchen apixaban (ELIQUIS) 5 MG TABS tablet Take 1 tablet (5 mg total) by mouth 2 (two) times daily. 60 tablet 6  . cholecalciferol (VITAMIN D) 1000 UNITS tablet Take 1,000 Units by mouth every morning.     Marland Kitchen guaiFENesin (MUCINEX) 600 MG 12 hr tablet Take 600 mg by mouth 2 (two) times daily as needed.    Marland Kitchen losartan-hydrochlorothiazide (HYZAAR) 100-25 MG tablet Take 1 tablet by mouth every morning. 30 tablet 6  . metoprolol tartrate (LOPRESSOR) 25 MG tablet Take 1 tablet (25 mg total) by mouth 2 (two) times daily. May take an extra tablet as needed for palpitations 270 tablet 3  . Misc. Devices MISC by Does not apply route at bedtime. CPAP    . Multiple  Vitamins-Minerals (MULTIVITAMIN WITH MINERALS) tablet Take 1 tablet by mouth every morning. Centrum Silver Womens 50+    . Polyethyl Glycol-Propyl Glycol (SYSTANE OP) Place 1 drop into both eyes as needed (dry eyes).    . potassium chloride SA (K-DUR) 20 MEQ tablet TAKE ONE TABLET BY MOUTH DAILY. 30 tablet 6  . PROAIR HFA 108 (90 Base) MCG/ACT inhaler INHALE TWO PUFFS BY MOUTH EVERY 6 HOURS AS NEEDED FOR WHEEZING AND SHORTNESS OF BREATH 8.5 g 3   No current facility-administered medications for this visit.      Past Surgical History:  Procedure Laterality Date  . CERVICAL DISC SURGERY  2/93  . CESAREAN SECTION  6/83   with BTL  . COLONOSCOPY N/A 03/19/2017   Procedure: COLONOSCOPY;  Surgeon: Doran Stabler, MD;  Location: [place];  Service: Gastroenterology;  Laterality: N/A;  . HYSTEROSCOPY  5/02, 01/2011  . LITHOTRIPSY  08/30/2014   with Dr. Jeffie Pollock at [facility]  . TOTAL KNEE ARTHROPLASTY Right 6/08   right  . TOTAL KNEE ARTHROPLASTY Left 4/10   left   . WRIST SURGERY Left 4/07   left     Allergies  Allergen Reactions  . Bactrim [Sulfamethoxazole-Trimethoprim] Other (See Comments)    Unknown   . Ciprofloxacin Other (See Comments)    GI upset  . Hydroxyzine Hcl     Other reaction(s): Other (See Comments) Pt becomes physchotic.  Marland Kitchen Keflet [Cephalexin] Other (See Comments)    GI upset  . Lipitor [Atorvastatin]     Muscle cramps  . Lisinopril Cough  . Spironolactone Nausea Only  . Vistaril [Hydroxyzine Hcl] Other (See Comments)    Pt becomes physchotic.  Marland Kitchen Erythromycin Rash  . Latex Rash  . Penicillins Rash    Has patient had a PCN reaction causing immediate rash, facial/tongue/throat swelling, SOB or lightheadedness with hypotension: Yes Has patient had a PCN reaction causing severe rash involving mucus membranes or skin necrosis: No Has patient had a PCN reaction that required hospitalization No Has patient had a PCN reaction occurring within the last 10  years: No If all of the above answers are "NO", then may proceed with Cephalosporin use.   . Tape Itching, Rash and Other (See Comments)    Burning Burning  . Tetracycline Rash      Family History  Problem Relation Age of Onset  . Heart disease Father   . Colon cancer Mother   . Heart disease Mother   . Breast cancer Maternal Aunt   . Colon cancer Maternal Aunt   . Colon cancer Maternal Uncle   . Ehlers-Danlos syndrome Other        neice-being tested 4/15  . Stomach cancer  Neg Hx   . Pancreatic cancer Neg Hx      Social History Nicole Cabrera reports that she has never smoked. She has never used smokeless tobacco. Nicole Cabrera reports no history of alcohol use.   Review of Systems CONSTITUTIONAL: No weight loss, fever, chills, weakness or fatigue.  HEENT: Eyes: No visual loss, blurred vision, double vision or yellow sclerae.No hearing loss, sneezing, congestion, runny nose or sore throat.  SKIN: No rash or itching.  CARDIOVASCULAR: per hpi RESPIRATORY: No shortness of breath, cough or sputum.  GASTROINTESTINAL: No anorexia, nausea, vomiting or diarrhea. No abdominal pain or blood.  GENITOURINARY: No burning on urination, no polyuria NEUROLOGICAL: No headache, dizziness, syncope, paralysis, ataxia, numbness or tingling in the extremities. No change in bowel or bladder control.  MUSCULOSKELETAL: No muscle, back pain, joint pain or stiffness.  LYMPHATICS: No enlarged nodes. No history of splenectomy.  PSYCHIATRIC: No history of depression or anxiety.  ENDOCRINOLOGIC: No reports of sweating, cold or heat intolerance. No polyuria or polydipsia.  Marland Kitchen   Physical Examination Today's Vitals   12/16/18 0959  BP: (!) 150/79  Pulse: 65  Temp: (!) 97.5 F (36.4 C)  Weight: 261 lb (118.4 kg)  Height: 4' 11.5" (1.511 m)   Body mass index is 51.83 kg/m.  Gen: resting comfortably, no acute distress HEENT: no scleral icterus, pupils equal round and reactive, no palptable cervical  adenopathy,  CV: RRR, no mr/g, no jvd Resp: Clear to auscultation bilaterally GI: abdomen is soft, non-tender, non-distended, normal bowel sounds, no hepatosplenomegaly MSK: extremities are warm, no edema.  Skin: warm, no rash Neuro:  no focal deficits Psych: appropriate affect   Diagnostic Studies  09/2016 echo Study Conclusions  - Left ventricle: The cavity size was normal. Wall thickness was increased in a pattern of mild LVH. Systolic function was normal. The estimated ejection fraction was in the range of 60% to 65%. Wall motion was normal; there were no regional wall motion abnormalities. Features are consistent with a pseudonormal left ventricular filling pattern, with concomitant abnormal relaxation and increased filling pressure (grade 2 diastolic dysfunction). - Aortic valve: Mildly calcified annulus. Trileaflet. There was trivial regurgitation. - Mitral valve: Moderately calcified annulus. The findings are consistent with mild stenosis. There was mild regurgitation. Mean gradient (D): 3 mm Hg. Valve area by pressure half-time: 1.54 cm^2. - Left atrium: The atrium was moderately dilated. - Right atrium: Central venous pressure (est): 8 mm Hg. - Tricuspid valve: There was trivial regurgitation. - Pulmonary arteries: Systolic pressure could not be accurately estimated. - Pericardium, extracardiac: There was no pericardial effusion.  Impressions:  - Mild LVH with LVEF 60-65% and grade 2 diastolic dysfunction. Moderate left atrial enlargement. Moderately calcified mitral annulus with evidence of mild mitral regurgitation and mild mitral stenosis. Sclerotic aortic valve with trivial aortic regurgitation. Trivial tricuspid regurgitation.   Assessment and Plan   1. PAF -denies any symptmos. EKG today shows NSR - continue current meds including anticoag.   2.HTN - above goal. She is resistant to med changes, she did not start the  norvasc we prescribed last visit - we have recommended stopping hyzaar, starting losartan 100mg  and chlorthlaidone 25mg  daily. Discussed in detail chlorthalidone is same family as HCTZ so not a new medication group for her.  - check BMET/Mg in 2 weeks.   3. Hyperlipidemia - muscle aches on atorvastatin Resistant to trying other statins - recommended again trying another statin, we discussed the benefits in stroke prevention given her prior event. She  remains resistant  F/u 63months  Arnoldo Lenis, M.D.

## 2018-12-16 NOTE — Patient Instructions (Signed)
Medication Instructions:  STOP HYZAAR  START LOSARTAN 100 MG DAILY   START CHLORTHALIDONE 25 MG DAILY   Labwork: 2 WEEKS  BMET. MAGNESIUM   Testing/Procedures: NONE  Follow-Up: Your physician wants you to follow-up in: 6 MONTHS. You will receive a reminder letter in the mail two months in advance. If you don't receive a letter, please call our office to schedule the follow-up appointment.   Any Other Special Instructions Will Be Listed Below (If Applicable).     If you need a refill on your cardiac medications before your next appointment, please call your pharmacy.

## 2018-12-21 DIAGNOSIS — H25012 Cortical age-related cataract, left eye: Secondary | ICD-10-CM | POA: Diagnosis not present

## 2018-12-21 DIAGNOSIS — H25812 Combined forms of age-related cataract, left eye: Secondary | ICD-10-CM | POA: Diagnosis not present

## 2018-12-21 DIAGNOSIS — H2512 Age-related nuclear cataract, left eye: Secondary | ICD-10-CM | POA: Diagnosis not present

## 2018-12-23 ENCOUNTER — Ambulatory Visit (INDEPENDENT_AMBULATORY_CARE_PROVIDER_SITE_OTHER): Payer: BC Managed Care – PPO | Admitting: Urology

## 2018-12-23 ENCOUNTER — Other Ambulatory Visit: Payer: Self-pay

## 2018-12-23 ENCOUNTER — Other Ambulatory Visit (HOSPITAL_COMMUNITY): Payer: Self-pay | Admitting: Urology

## 2018-12-23 ENCOUNTER — Ambulatory Visit (HOSPITAL_COMMUNITY)
Admission: RE | Admit: 2018-12-23 | Discharge: 2018-12-23 | Disposition: A | Discharge status: Home / Self Care | Payer: BC Managed Care – PPO | Source: Ambulatory Visit | Attending: Urology | Admitting: Urology

## 2018-12-23 DIAGNOSIS — N200 Calculus of kidney: Secondary | ICD-10-CM

## 2018-12-23 DIAGNOSIS — N393 Stress incontinence (female) (male): Secondary | ICD-10-CM | POA: Diagnosis not present

## 2018-12-29 ENCOUNTER — Telehealth: Payer: Self-pay | Admitting: General Practice

## 2018-12-29 NOTE — Telephone Encounter (Signed)
Pt informed to have labs done 2 weeks after med change. Pt voiced understanding.

## 2018-12-29 NOTE — Telephone Encounter (Signed)
Pt has not started her recent med changes yet due to having cataract surgery, when will she need to have her labs done?  Please call [phone]

## 2019-01-14 ENCOUNTER — Emergency Department (HOSPITAL_COMMUNITY): Payer: Medicare Other

## 2019-01-14 ENCOUNTER — Emergency Department (HOSPITAL_COMMUNITY)
Admission: EM | Admit: 2019-01-14 | Discharge: 2019-01-14 | Disposition: A | Discharge status: Home / Self Care | Payer: Medicare Other | Attending: Emergency Medicine | Admitting: Emergency Medicine

## 2019-01-14 ENCOUNTER — Other Ambulatory Visit: Payer: Self-pay

## 2019-01-14 ENCOUNTER — Encounter (HOSPITAL_COMMUNITY): Payer: Self-pay

## 2019-01-14 DIAGNOSIS — Z91040 Latex allergy status: Secondary | ICD-10-CM | POA: Insufficient documentation

## 2019-01-14 DIAGNOSIS — I10 Essential (primary) hypertension: Secondary | ICD-10-CM | POA: Diagnosis not present

## 2019-01-14 DIAGNOSIS — Z79899 Other long term (current) drug therapy: Secondary | ICD-10-CM | POA: Insufficient documentation

## 2019-01-14 DIAGNOSIS — Z7901 Long term (current) use of anticoagulants: Secondary | ICD-10-CM | POA: Insufficient documentation

## 2019-01-14 DIAGNOSIS — E119 Type 2 diabetes mellitus without complications: Secondary | ICD-10-CM | POA: Diagnosis not present

## 2019-01-14 DIAGNOSIS — R519 Headache, unspecified: Secondary | ICD-10-CM | POA: Insufficient documentation

## 2019-01-14 DIAGNOSIS — I6389 Other cerebral infarction: Secondary | ICD-10-CM | POA: Diagnosis not present

## 2019-01-14 NOTE — ED Notes (Signed)
Patient transported to CT 

## 2019-01-14 NOTE — ED Provider Notes (Signed)
[facility] EMERGENCY DEPARTMENT Provider Note   CSN: [id number] Arrival date & time: 01/14/19  1939     History   Chief Complaint Chief Complaint  Patient presents with   Headache   Hypertension    HPI Nicole Cabrera is a 73 y.o. female.     Patient complains of a headache.  She also states that her blood pressure has been elevated  The history is provided by the patient. No language interpreter was used.  Headache Pain location:  Generalized Quality:  Dull Radiates to:  Does not radiate Severity currently:  7/10 Onset quality:  Sudden Timing:  Constant Progression:  Worsening Chronicity:  New Context: not activity   Relieved by:  Nothing Worsened by:  Nothing Associated symptoms: no abdominal pain, no back pain, no congestion, no cough, no diarrhea, no fatigue, no seizures and no sinus pressure   Hypertension Associated symptoms include headaches. Pertinent negatives include no chest pain and no abdominal pain.    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   Diabetes mellitus without complication ([place])    stated not diabetic but her AIC has been high so she is monitored closely    Endometrial polyp 04/2000   Fall 01/2018   nose fracture    Heart murmur    Hemorrhoids    Hypertension    Kidney stones    Obesity    Osteopenia 05/2007   Paroxysmal atrial fibrillation ([place])    Peripheral neuropathy    PMB (postmenopausal bleeding) 04/2000   SAB (spontaneous abortion) 1981   Sleep apnea    CPAP   Stroke ([place])    SVD (spontaneous vaginal delivery) 04/1964, 05/1978   x3   TIA (transient ischemic attack)     Patient Active Problem List   Diagnosis Date Noted   Hypokalemia 03/19/2017   HLD (hyperlipidemia)  03/19/2017   Hematochezia    Acute blood loss anemia    Rectal bleeding 03/18/2017   Acute GI bleeding    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   Stroke (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[place]) 01/18/2017   Intracerebral hemorrhage 01/18/2017   Elevated troponin 01/17/2017   Atrial fibrillation ([place]) 10/27/2016   Obesity 06/20/2015   Nephrolithiasis 07/09/2014   Asthma with bronchitis 02/23/2014   Aphasia 02/27/2013   ACUTE BRONCHITIS 08/30/2009   Essential hypertension 02/22/2007   Obstructive sleep apnea 02/22/2007   ALLERGY 02/22/2007    Past Surgical History:  Procedure Laterality Date   CATARACT EXTRACTION Right 12/07/2018   CERVICAL [place] SURGERY  2/93   CESAREAN SECTION  6/83   with BTL   COLONOSCOPY N/A 03/19/2017   Procedure: COLONOSCOPY;  Surgeon: Doran Stabler, MD;  Location: [facility];  Service: Gastroenterology;  Laterality: N/A;   HYSTEROSCOPY  5/02, 01/2011   LITHOTRIPSY  08/30/2014   with Dr. Jeffie Pollock at [place] Right 6/08   right   TOTAL KNEE ARTHROPLASTY Left 4/10   left    WRIST SURGERY Left 4/07   left     OB History    Gravida  5   Para  3   Term      Preterm      AB  2   Living  3     SAB  1   TAB  1   Ectopic      Multiple      Live Births               Home Medications    Prior to Admission medications   Medication Sig Start Date End Date Taking? Authorizing Provider  acetaminophen (TYLENOL) 500 MG tablet Take 1,000 mg by mouth 2 (two) times daily as needed for headache.   Yes [provider]  ALPRAZolam (XANAX) 0.25 MG tablet Take 0.25 mg by mouth daily as needed for anxiety.  02/01/18  Yes [provider]  apixaban (ELIQUIS) 5 MG TABS tablet Take 1 tablet (5 mg total) by mouth 2 (two) times daily. 09/26/18  Yes BranchAlphonse Guild, MD  cholecalciferol (VITAMIN D) 1000 UNITS tablet Take 1,000 Units by mouth every morning.    Yes [provider]    guaiFENesin (MUCINEX) 600 MG 12 hr tablet Take 600 mg by mouth 2 (two) times daily as needed for cough or to loosen phlegm.    Yes [provider]  losartan-hydrochlorothiazide (HYZAAR) 100-25 MG tablet Take 1 tablet by mouth every morning. 12/19/18  Yes [provider]  metoprolol tartrate (LOPRESSOR) 25 MG tablet Take 1 tablet (25 mg total) by mouth 2 (two) times daily. May take an extra tablet as needed for palpitations 03/24/18  Yes Strader, [place], PA-C  Multiple Vitamins-Minerals (MULTIVITAMIN WITH MINERALS) tablet Take 1 tablet by mouth every morning. Centrum Silver Womens 50+   Yes [provider]  Polyethyl Glycol-Propyl Glycol (SYSTANE OP) Place 1 drop into both eyes daily as needed (dry eyes).    Yes [provider]  potassium chloride SA (K-DUR) 20 MEQ tablet TAKE ONE TABLET BY MOUTH DAILY. Patient taking differently: Take 20 mEq by mouth daily.  10/13/18  Yes Branch, Alphonse Guild, MD  PROAIR HFA 108 [phone] Base) MCG/ACT inhaler INHALE TWO PUFFS BY MOUTH EVERY 6 HOURS AS NEEDED FOR WHEEZING AND SHORTNESS OF BREATH Patient taking differently: Inhale 2 puffs into the lungs every 6 (six) hours as needed.  11/16/17  Yes Young, Tarri Fuller D, MD  sodium chloride (OCEAN) 0.65 % SOLN nasal spray Place 1 spray into both nostrils as needed for congestion.   Yes [provider]  chlorthalidone (HYGROTON) 25 MG tablet Take 1 tablet (25 mg total) by mouth daily. Patient not taking: Reported on 01/14/2019 12/16/18 03/16/19  Arnoldo Lenis, MD  losartan (COZAAR) 100 MG tablet Take 1 tablet (100 mg total) by mouth daily. Patient not taking: Reported on 01/14/2019 12/16/18 03/16/19  Arnoldo Lenis, MD    Family History Family History  Problem Relation Age of Onset   Heart disease Father    Colon cancer Mother    Heart disease Mother    Breast cancer Maternal Aunt    Colon cancer Maternal Aunt    Colon cancer Maternal Uncle    Ehlers-Danlos  syndrome Other        neice-being tested 4/15   Stomach cancer Neg Hx    Pancreatic cancer Neg Hx     Social History Social History   Tobacco Use   Smoking status: Never Smoker   Smokeless tobacco: Never Used  Substance Use Topics   Alcohol use: No    Alcohol/week: 0.0 - 1.0 standard drinks   Drug use: No     Allergies   Bactrim [sulfamethoxazole-trimethoprim], Ciprofloxacin, Hydroxyzine hcl, Keflet [cephalexin],  Lipitor [atorvastatin], Lisinopril, Spironolactone, Vistaril [hydroxyzine hcl], Erythromycin, Latex, Penicillins, Tape, and Tetracycline   Review of Systems Review of Systems  Constitutional: Negative for appetite change and fatigue.  HENT: Negative for congestion, ear discharge and sinus pressure.   Eyes: Negative for discharge.  Respiratory: Negative for cough.   Cardiovascular: Negative for chest pain.  Gastrointestinal: Negative for abdominal pain and diarrhea.  Genitourinary: Negative for frequency and hematuria.  Musculoskeletal: Negative for back pain.  Skin: Negative for rash.  Neurological: Positive for headaches. Negative for seizures.  Psychiatric/Behavioral: Negative for hallucinations.     Physical Exam Updated Vital Signs BP (!) 146/88    Pulse 77    Temp 98 F (36.7 C) (Oral)    Resp (!) 22    Ht 4\' 11"  (1.499 m)    Wt 118 kg    LMP 01/22/2011    SpO2 95%    BMI 52.54 kg/m   Physical Exam Vitals signs and nursing note reviewed.  Constitutional:      Appearance: She is well-developed.  HENT:     Head: Normocephalic.     Nose: Nose normal.  Eyes:     General: No scleral icterus.    Conjunctiva/sclera: Conjunctivae normal.  Neck:     Musculoskeletal: Neck supple.     Thyroid: No thyromegaly.  Cardiovascular:     Rate and Rhythm: Normal rate and regular rhythm.     Heart sounds: No murmur. No friction rub. No gallop.   Pulmonary:     Breath sounds: No stridor. No wheezing or rales.  Chest:     Chest wall: No tenderness.    Abdominal:     General: There is no distension.     Tenderness: There is no abdominal tenderness. There is no rebound.  Musculoskeletal: Normal range of motion.  Lymphadenopathy:     Cervical: No cervical adenopathy.  Skin:    Findings: No erythema or rash.  Neurological:     Mental Status: She is alert and oriented to person, place, and time.     Motor: No abnormal muscle tone.     Coordination: Coordination normal.  Psychiatric:        Behavior: Behavior normal.      ED Treatments / Results  Labs (all labs ordered are listed, but only abnormal results are displayed) Labs Reviewed - No data to display  EKG None  Radiology Ct Head Wo Contrast  Result Date: 01/14/2019 CLINICAL DATA:  Cerebral hemorrhage suspected EXAM: CT HEAD WITHOUT CONTRAST TECHNIQUE: Contiguous axial images were obtained from the base of the skull through the vertex without intravenous contrast. COMPARISON:  CT 02/12/2018 FINDINGS: Brain: Stable gliosis in the left parietal region compatible with remote infarct. Stable parafalcine lipoma and benign dural calcifications. No evidence of acute infarction, hemorrhage, hydrocephalus, extra-axial collection or mass lesion/mass effect. Symmetric prominence of the ventricles, cisterns and sulci compatible with parenchymal volume loss. Patchy areas of white matter hypoattenuation are most compatible with chronic microvascular angiopathy. Vascular: Atherosclerotic calcification of the carotid siphons and intradural vertebral arteries. No hyperdense vessel. Skull: No scalp swelling or hematoma. No calvarial fracture or suspicious osseous lesions. Sinuses/Orbits: Paranasal sinuses and mastoid air cells are predominantly clear. Other: Orbital structures are unremarkable aside from prior lens extractions. IMPRESSION: No acute intracranial abnormality. Stable remote left parietal infarct. Mild parenchymal volume loss and chronic microvascular angiopathy changes. Electronically  Signed   By: Lovena Le M.D.   On: 01/14/2019 21:09    Procedures Procedures (including critical care  time)  Medications Ordered in ED Medications - No data to display   Initial Impression / Assessment and Plan / ED Course  I have reviewed the triage vital signs and the nursing notes.  Pertinent labs & imaging results that were available during my care of the patient were reviewed by me and considered in my medical decision making (see chart for details).       CT scan of the head negative.  Patient's headache improved with Tylenol and her blood pressure also improved.  She will be discharged home to follow-up with her PCP  Final Clinical Impressions(s) / ED Diagnoses   Final diagnoses:  Bad headache    ED Discharge Orders    None       Milton Ferguson, MD 01/14/19 2227

## 2019-01-14 NOTE — Discharge Instructions (Addendum)
Follow-up with your family doctor this week for recheck of your blood pressure and your headache

## 2019-01-14 NOTE — ED Triage Notes (Signed)
Pt reports headache that started while she was out this afternoon, pt has taken Tylenol with no relief. Pt also reports high BP that read 173/100 at home. Spouse reports EKG [facility] app showed a-fib with a rate 82 and he was concerned.

## 2019-01-17 DIAGNOSIS — I480 Paroxysmal atrial fibrillation: Secondary | ICD-10-CM | POA: Diagnosis not present

## 2019-01-17 DIAGNOSIS — I10 Essential (primary) hypertension: Secondary | ICD-10-CM | POA: Diagnosis not present

## 2019-02-01 ENCOUNTER — Other Ambulatory Visit (HOSPITAL_COMMUNITY)
Admission: RE | Admit: 2019-02-01 | Discharge: 2019-02-01 | Disposition: A | Discharge status: Home / Self Care | Payer: BC Managed Care – PPO | Source: Ambulatory Visit | Attending: Cardiology | Admitting: Cardiology

## 2019-02-01 DIAGNOSIS — Z79899 Other long term (current) drug therapy: Secondary | ICD-10-CM | POA: Diagnosis not present

## 2019-02-01 DIAGNOSIS — I480 Paroxysmal atrial fibrillation: Secondary | ICD-10-CM | POA: Insufficient documentation

## 2019-02-01 LAB — BASIC METABOLIC PANEL
Anion gap: 11 (ref 5–15)
BUN: 16 mg/dL (ref 8–23)
CO2: 27 mmol/L (ref 22–32)
Calcium: 9.1 mg/dL (ref 8.9–10.3)
Chloride: 100 mmol/L (ref 98–111)
Creatinine, Ser: 0.83 mg/dL (ref 0.44–1.00)
GFR calc Af Amer: >60 mL/min (ref >60)
GFR calc non Af Amer: >60 mL/min (ref >60)
Glucose, Bld: 104 mg/dL — ABNORMAL HIGH (ref 70–99)
Potassium: 3.8 mmol/L (ref 3.5–5.1)
Sodium: 138 mmol/L (ref 135–145)

## 2019-02-01 LAB — MAGNESIUM: Magnesium: 2 mg/dL (ref 1.7–2.4)

## 2019-02-06 ENCOUNTER — Emergency Department (HOSPITAL_COMMUNITY): Payer: BC Managed Care – PPO

## 2019-02-06 ENCOUNTER — Encounter (HOSPITAL_COMMUNITY): Payer: Self-pay

## 2019-02-06 ENCOUNTER — Other Ambulatory Visit: Payer: Self-pay

## 2019-02-06 ENCOUNTER — Emergency Department (HOSPITAL_COMMUNITY)
Admission: EM | Admit: 2019-02-06 | Discharge: 2019-02-06 | Discharge status: Against Medical Advice | Payer: BC Managed Care – PPO | Source: Home / Self Care | Attending: Emergency Medicine | Admitting: Emergency Medicine

## 2019-02-06 DIAGNOSIS — G459 Transient cerebral ischemic attack, unspecified: Secondary | ICD-10-CM | POA: Insufficient documentation

## 2019-02-06 DIAGNOSIS — I639 Cerebral infarction, unspecified: Secondary | ICD-10-CM | POA: Diagnosis not present

## 2019-02-06 DIAGNOSIS — F419 Anxiety disorder, unspecified: Secondary | ICD-10-CM | POA: Diagnosis present

## 2019-02-06 DIAGNOSIS — M19011 Primary osteoarthritis, right shoulder: Secondary | ICD-10-CM | POA: Diagnosis present

## 2019-02-06 DIAGNOSIS — R252 Cramp and spasm: Secondary | ICD-10-CM | POA: Diagnosis not present

## 2019-02-06 DIAGNOSIS — R270 Ataxia, unspecified: Secondary | ICD-10-CM | POA: Diagnosis not present

## 2019-02-06 DIAGNOSIS — R471 Dysarthria and anarthria: Secondary | ICD-10-CM | POA: Diagnosis not present

## 2019-02-06 DIAGNOSIS — G9349 Other encephalopathy: Secondary | ICD-10-CM | POA: Diagnosis not present

## 2019-02-06 DIAGNOSIS — Z91040 Latex allergy status: Secondary | ICD-10-CM | POA: Insufficient documentation

## 2019-02-06 DIAGNOSIS — M19012 Primary osteoarthritis, left shoulder: Secondary | ICD-10-CM | POA: Diagnosis present

## 2019-02-06 DIAGNOSIS — R4702 Dysphasia: Secondary | ICD-10-CM | POA: Diagnosis not present

## 2019-02-06 DIAGNOSIS — I6389 Other cerebral infarction: Secondary | ICD-10-CM | POA: Diagnosis not present

## 2019-02-06 DIAGNOSIS — M160 Bilateral primary osteoarthritis of hip: Secondary | ICD-10-CM | POA: Diagnosis present

## 2019-02-06 DIAGNOSIS — M8580 Other specified disorders of bone density and structure, unspecified site: Secondary | ICD-10-CM | POA: Diagnosis present

## 2019-02-06 DIAGNOSIS — I480 Paroxysmal atrial fibrillation: Secondary | ICD-10-CM | POA: Diagnosis not present

## 2019-02-06 DIAGNOSIS — E119 Type 2 diabetes mellitus without complications: Secondary | ICD-10-CM | POA: Diagnosis not present

## 2019-02-06 DIAGNOSIS — H539 Unspecified visual disturbance: Secondary | ICD-10-CM | POA: Diagnosis not present

## 2019-02-06 DIAGNOSIS — Z79899 Other long term (current) drug therapy: Secondary | ICD-10-CM | POA: Insufficient documentation

## 2019-02-06 DIAGNOSIS — Z96653 Presence of artificial knee joint, bilateral: Secondary | ICD-10-CM | POA: Diagnosis present

## 2019-02-06 DIAGNOSIS — E785 Hyperlipidemia, unspecified: Secondary | ICD-10-CM | POA: Diagnosis not present

## 2019-02-06 DIAGNOSIS — R519 Headache, unspecified: Secondary | ICD-10-CM | POA: Diagnosis not present

## 2019-02-06 DIAGNOSIS — R531 Weakness: Secondary | ICD-10-CM | POA: Diagnosis not present

## 2019-02-06 DIAGNOSIS — D72829 Elevated white blood cell count, unspecified: Secondary | ICD-10-CM | POA: Diagnosis not present

## 2019-02-06 DIAGNOSIS — R29700 NIHSS score 0: Secondary | ICD-10-CM | POA: Diagnosis not present

## 2019-02-06 DIAGNOSIS — D689 Coagulation defect, unspecified: Secondary | ICD-10-CM | POA: Diagnosis not present

## 2019-02-06 DIAGNOSIS — E1142 Type 2 diabetes mellitus with diabetic polyneuropathy: Secondary | ICD-10-CM | POA: Diagnosis present

## 2019-02-06 DIAGNOSIS — R4781 Slurred speech: Secondary | ICD-10-CM | POA: Diagnosis not present

## 2019-02-06 DIAGNOSIS — Z7901 Long term (current) use of anticoagulants: Secondary | ICD-10-CM | POA: Diagnosis not present

## 2019-02-06 DIAGNOSIS — E1165 Type 2 diabetes mellitus with hyperglycemia: Secondary | ICD-10-CM | POA: Diagnosis not present

## 2019-02-06 DIAGNOSIS — Z20828 Contact with and (suspected) exposure to other viral communicable diseases: Secondary | ICD-10-CM | POA: Diagnosis not present

## 2019-02-06 DIAGNOSIS — R011 Cardiac murmur, unspecified: Secondary | ICD-10-CM | POA: Diagnosis present

## 2019-02-06 DIAGNOSIS — R29818 Other symptoms and signs involving the nervous system: Secondary | ICD-10-CM | POA: Diagnosis not present

## 2019-02-06 DIAGNOSIS — Z8673 Personal history of transient ischemic attack (TIA), and cerebral infarction without residual deficits: Secondary | ICD-10-CM | POA: Diagnosis not present

## 2019-02-06 DIAGNOSIS — H538 Other visual disturbances: Secondary | ICD-10-CM | POA: Diagnosis not present

## 2019-02-06 DIAGNOSIS — G4733 Obstructive sleep apnea (adult) (pediatric): Secondary | ICD-10-CM | POA: Diagnosis not present

## 2019-02-06 DIAGNOSIS — I10 Essential (primary) hypertension: Secondary | ICD-10-CM | POA: Diagnosis not present

## 2019-02-06 DIAGNOSIS — Z6841 Body Mass Index (BMI) 40.0 and over, adult: Secondary | ICD-10-CM | POA: Diagnosis not present

## 2019-02-06 LAB — COMPREHENSIVE METABOLIC PANEL
ALT: 17 U/L (ref 0–44)
AST: 21 U/L (ref 15–41)
Albumin: 3.8 g/dL (ref 3.5–5.0)
Alkaline Phosphatase: 83 U/L (ref 38–126)
Anion gap: 8 (ref 5–15)
BUN: 15 mg/dL (ref 8–23)
CO2: 27 mmol/L (ref 22–32)
Calcium: 8.7 mg/dL — ABNORMAL LOW (ref 8.9–10.3)
Chloride: 98 mmol/L (ref 98–111)
Creatinine, Ser: 0.68 mg/dL (ref 0.44–1.00)
GFR calc Af Amer: >60 mL/min (ref >60)
GFR calc non Af Amer: >60 mL/min (ref >60)
Glucose, Bld: 113 mg/dL — ABNORMAL HIGH (ref 70–99)
Potassium: 3.1 mmol/L — ABNORMAL LOW (ref 3.5–5.1)
Sodium: 133 mmol/L — ABNORMAL LOW (ref 135–145)
Total Bilirubin: 0.4 mg/dL (ref 0.3–1.2)
Total Protein: 7 g/dL (ref 6.5–8.1)

## 2019-02-06 LAB — APTT: aPTT: 37 seconds — ABNORMAL HIGH (ref 24–36)

## 2019-02-06 LAB — I-STAT CHEM 8, ED
BUN: 15 mg/dL (ref 8–23)
Calcium, Ion: 1.16 mmol/L (ref 1.15–1.40)
Chloride: 98 mmol/L (ref 98–111)
Creatinine, Ser: 0.8 mg/dL (ref 0.44–1.00)
Glucose, Bld: 109 mg/dL — ABNORMAL HIGH (ref 70–99)
HCT: 46 % (ref 36.0–46.0)
Hemoglobin: 15.6 g/dL — ABNORMAL HIGH (ref 12.0–15.0)
Potassium: 3.3 mmol/L — ABNORMAL LOW (ref 3.5–5.1)
Sodium: 139 mmol/L (ref 135–145)
TCO2: 27 mmol/L (ref 22–32)

## 2019-02-06 LAB — CBC
HCT: 47.2 % — ABNORMAL HIGH (ref 36.0–46.0)
Hemoglobin: 14.7 g/dL (ref 12.0–15.0)
MCH: 27.8 pg (ref 26.0–34.0)
MCHC: 31.1 g/dL (ref 30.0–36.0)
MCV: 89.4 fL (ref 80.0–100.0)
Platelets: 258 10*3/uL (ref 150–400)
RBC: 5.28 MIL/uL — ABNORMAL HIGH (ref 3.87–5.11)
RDW: 14.3 % (ref 11.5–15.5)
WBC: 11.1 10*3/uL — ABNORMAL HIGH (ref 4.0–10.5)
nRBC: 0 % (ref 0.0–0.2)

## 2019-02-06 LAB — DIFFERENTIAL
Abs Immature Granulocytes: 0.05 10*3/uL (ref 0.00–0.07)
Basophils Absolute: 0.1 10*3/uL (ref 0.0–0.1)
Basophils Relative: 1 %
Eosinophils Absolute: 0.3 10*3/uL (ref 0.0–0.5)
Eosinophils Relative: 2 %
Immature Granulocytes: 1 %
Lymphocytes Relative: 27 %
Lymphs Abs: 3 10*3/uL (ref 0.7–4.0)
Monocytes Absolute: 1 10*3/uL (ref 0.1–1.0)
Monocytes Relative: 9 %
Neutro Abs: 6.7 10*3/uL (ref 1.7–7.7)
Neutrophils Relative %: 60 %

## 2019-02-06 LAB — PROTIME-INR
INR: 1.2 (ref 0.8–1.2)
Prothrombin Time: 14.7 seconds (ref 11.4–15.2)

## 2019-02-06 LAB — CBG MONITORING, ED: Glucose-Capillary: 98 mg/dL (ref 70–99)

## 2019-02-06 LAB — ETHANOL: Alcohol, Ethyl (B): <10 mg/dL (ref <10)

## 2019-02-06 NOTE — ED Triage Notes (Signed)
Pt reports noticed problem with her vision today at 3:30.  Reports was trying to put on an ear ring and felt like her, "eyes weren't working together."  Denies any extremity weakness or numbness.  Reports some difficulty putting words together but says has been a problem since her last stroke.

## 2019-02-06 NOTE — ED Notes (Signed)
Pt wheelchaired to waiting room and put in car. Pt verbalized understanding of discharge instructions.

## 2019-02-06 NOTE — Discharge Instructions (Addendum)
Since you are not willing to be admitted to the hospital you should follow-up with your doctor tomorrow as planned.  I have given you a copy of everything we have done

## 2019-02-06 NOTE — Consult Note (Signed)
[facility] Consult Services   Date of Service:   02/06/2019 16:37:38  Impression:     .  Transient Ischemic Attack  Comments/Sign-Out: p/w wavy vision like "water" behind her eyes; word finding difficulty; [id number]; currently on eliquis; h/o stroke, afib, HLD, HTN, OSA; CT head NEG for acute lesions; NIHSS 0, pt reports h/o migraines in past but denies any recurrent today; NOT tPA candidate b/c of resolution of sx's and use of eliquis  Metrics: Last Known Well: 02/06/2019 15:30:00 [facility] Notification Time: 02/06/2019 16:37:38 Arrival Time: 02/06/2019 16:30:00 Stamp Time: 02/06/2019 16:37:38 Time First Login Attempt: 02/06/2019 16:44:50 Video Start Time: 02/06/2019 16:44:50  Symptoms: wavy vision like "water" behind her eyes; word finding difficulty NIHSS Start Assessment Time: 02/06/2019 16:44:55 Patient is not a candidate for Alteplase/Activase. Patient was not deemed candidate for Alteplase/Activase thrombolytics because of Use of NOAs within 48 hours. Video End Time: 02/06/2019 16:52:12  CT head showed no acute hemorrhage or acute core infarct.  Clinical Presentation is not Suggestive of Large Vessel Occlusive Disease  ED Physician notified of diagnostic impression and management plan on 02/06/2019 16:52:11  Our recommendations are outlined below.  Recommendations:     .  Activate Stroke Protocol Admission/Order Set     .  Stroke/Telemetry Floor     .  Neuro Checks     .  Bedside Swallow Eval     .  DVT Prophylaxis     .  IV Fluids, Normal Saline     .  Head of Bed 30 Degrees     .  Euglycemia and Avoid Hyperthermia (PRN Acetaminophen)     .  admit for TIA vs stroke evaluation; ocular pathology is possible vs ocular migraine; recommend optho eval; cont home eliquis tonight; check cbc, bmp, u/a, tsh, lipids, hga1c, IV fluids permissive HTN  Routine Consultation with [facility] for Follow up Care  Sign Out:     .   Discussed with Emergency Department Provider    ------------------------------------------------------------------------------  History of Present Illness: Patient is a 73 year old Female.  Patient was brought by EMS for symptoms of wavy vision like "water" behind her eyes; word finding difficulty  p/w wavy vision like "water" behind her eyes; word finding difficulty; LKW 1530; currently on eliquis; h/o stroke, afib, HLD, HTN, OSA; CT head NEG for acute lesions; NIHSS 0, pt reports h/o migraines in past but denies any recurrent today; NOT tPA candidate b/c of resolution of sx's and use of eliquis        Examination: BP(SBP 140-200), Pulse(<120), Blood Glucose(<110) 1A: Level of Consciousness - Alert; keenly responsive + 0 1B: Ask Month and Age - Both Questions Right + 0 1C: Blink Eyes & Squeeze Hands - Performs Both Tasks + 0 2: Test Horizontal Extraocular Movements - Normal + 0 3: Test Visual Fields - No Visual Loss + 0 4: Test Facial Palsy (Use Grimace if Obtunded) - Normal symmetry + 0 5A: Test Left Arm Motor Drift - No Drift for 10 Seconds + 0 5B: Test Right Arm Motor Drift - No Drift for 10 Seconds + 0 6A: Test Left Leg Motor Drift - No Drift for 5 Seconds + 0 6B: Test Right Leg Motor Drift - No Drift for 5 Seconds + 0 7: Test Limb Ataxia (FNF/Heel-Shin) - No Ataxia + 0 8: Test Sensation - Normal; No sensory loss + 0 9: Test Language/Aphasia - Normal; No aphasia + 0 10: Test Dysarthria - Normal + 0 11: Test  Extinction/Inattention - No abnormality + 0  NIHSS Score: 0  Pre-Morbid Modified Ranking Scale: 0 Points = No symptoms at all  Patient/Family was informed the Neurology Consult would happen via TeleHealth consult by way of interactive audio and video telecommunications and consented to receiving care in this manner.   Due to the immediate potential for life-threatening deterioration due to underlying acute neurologic illness, I spent 30 minutes providing critical  care. This time includes time for face to face visit via telemedicine, review of medical records, imaging studies and discussion of findings with providers, the patient and/or family.   Dr Agustin Cree   [facility] [phone]  Case [id number]

## 2019-02-07 ENCOUNTER — Emergency Department (HOSPITAL_COMMUNITY): Payer: BC Managed Care – PPO

## 2019-02-07 ENCOUNTER — Inpatient Hospital Stay (HOSPITAL_COMMUNITY): Payer: BC Managed Care – PPO

## 2019-02-07 ENCOUNTER — Encounter (HOSPITAL_COMMUNITY): Payer: Self-pay

## 2019-02-07 ENCOUNTER — Other Ambulatory Visit: Payer: Self-pay

## 2019-02-07 ENCOUNTER — Inpatient Hospital Stay (HOSPITAL_COMMUNITY)
Admission: EM | Admit: 2019-02-07 | Discharge: 2019-02-08 | DRG: 065 | Discharge status: Against Medical Advice | Payer: BC Managed Care – PPO | Attending: Internal Medicine | Admitting: Internal Medicine

## 2019-02-07 ENCOUNTER — Telehealth: Payer: Self-pay

## 2019-02-07 DIAGNOSIS — I6389 Other cerebral infarction: Secondary | ICD-10-CM | POA: Diagnosis not present

## 2019-02-07 DIAGNOSIS — I10 Essential (primary) hypertension: Secondary | ICD-10-CM | POA: Diagnosis not present

## 2019-02-07 DIAGNOSIS — R011 Cardiac murmur, unspecified: Secondary | ICD-10-CM | POA: Diagnosis present

## 2019-02-07 DIAGNOSIS — D689 Coagulation defect, unspecified: Secondary | ICD-10-CM | POA: Diagnosis not present

## 2019-02-07 DIAGNOSIS — Z7901 Long term (current) use of anticoagulants: Secondary | ICD-10-CM

## 2019-02-07 DIAGNOSIS — E119 Type 2 diabetes mellitus without complications: Secondary | ICD-10-CM

## 2019-02-07 DIAGNOSIS — Z888 Allergy status to other drugs, medicaments and biological substances status: Secondary | ICD-10-CM

## 2019-02-07 DIAGNOSIS — D72829 Elevated white blood cell count, unspecified: Secondary | ICD-10-CM | POA: Diagnosis present

## 2019-02-07 DIAGNOSIS — Z96653 Presence of artificial knee joint, bilateral: Secondary | ICD-10-CM | POA: Diagnosis present

## 2019-02-07 DIAGNOSIS — I639 Cerebral infarction, unspecified: Secondary | ICD-10-CM | POA: Diagnosis present

## 2019-02-07 DIAGNOSIS — R4702 Dysphasia: Secondary | ICD-10-CM | POA: Diagnosis present

## 2019-02-07 DIAGNOSIS — Z880 Allergy status to penicillin: Secondary | ICD-10-CM

## 2019-02-07 DIAGNOSIS — Z882 Allergy status to sulfonamides status: Secondary | ICD-10-CM

## 2019-02-07 DIAGNOSIS — E1165 Type 2 diabetes mellitus with hyperglycemia: Secondary | ICD-10-CM | POA: Diagnosis present

## 2019-02-07 DIAGNOSIS — G9349 Other encephalopathy: Secondary | ICD-10-CM | POA: Diagnosis present

## 2019-02-07 DIAGNOSIS — M160 Bilateral primary osteoarthritis of hip: Secondary | ICD-10-CM | POA: Diagnosis present

## 2019-02-07 DIAGNOSIS — Z881 Allergy status to other antibiotic agents status: Secondary | ICD-10-CM

## 2019-02-07 DIAGNOSIS — R29700 NIHSS score 0: Secondary | ICD-10-CM | POA: Diagnosis present

## 2019-02-07 DIAGNOSIS — G4733 Obstructive sleep apnea (adult) (pediatric): Secondary | ICD-10-CM | POA: Diagnosis not present

## 2019-02-07 DIAGNOSIS — F419 Anxiety disorder, unspecified: Secondary | ICD-10-CM | POA: Diagnosis present

## 2019-02-07 DIAGNOSIS — E785 Hyperlipidemia, unspecified: Secondary | ICD-10-CM | POA: Diagnosis present

## 2019-02-07 DIAGNOSIS — R252 Cramp and spasm: Secondary | ICD-10-CM | POA: Diagnosis present

## 2019-02-07 DIAGNOSIS — Z91040 Latex allergy status: Secondary | ICD-10-CM

## 2019-02-07 DIAGNOSIS — H539 Unspecified visual disturbance: Secondary | ICD-10-CM

## 2019-02-07 DIAGNOSIS — Z8673 Personal history of transient ischemic attack (TIA), and cerebral infarction without residual deficits: Secondary | ICD-10-CM

## 2019-02-07 DIAGNOSIS — H538 Other visual disturbances: Secondary | ICD-10-CM | POA: Diagnosis present

## 2019-02-07 DIAGNOSIS — M19012 Primary osteoarthritis, left shoulder: Secondary | ICD-10-CM | POA: Diagnosis present

## 2019-02-07 DIAGNOSIS — M19011 Primary osteoarthritis, right shoulder: Secondary | ICD-10-CM | POA: Diagnosis present

## 2019-02-07 DIAGNOSIS — R739 Hyperglycemia, unspecified: Secondary | ICD-10-CM | POA: Diagnosis present

## 2019-02-07 DIAGNOSIS — I480 Paroxysmal atrial fibrillation: Secondary | ICD-10-CM | POA: Diagnosis present

## 2019-02-07 DIAGNOSIS — E1142 Type 2 diabetes mellitus with diabetic polyneuropathy: Secondary | ICD-10-CM | POA: Diagnosis present

## 2019-02-07 DIAGNOSIS — Z9109 Other allergy status, other than to drugs and biological substances: Secondary | ICD-10-CM

## 2019-02-07 DIAGNOSIS — Z6841 Body Mass Index (BMI) 40.0 and over, adult: Secondary | ICD-10-CM

## 2019-02-07 DIAGNOSIS — Z8249 Family history of ischemic heart disease and other diseases of the circulatory system: Secondary | ICD-10-CM

## 2019-02-07 DIAGNOSIS — M8580 Other specified disorders of bone density and structure, unspecified site: Secondary | ICD-10-CM | POA: Diagnosis present

## 2019-02-07 DIAGNOSIS — Z79899 Other long term (current) drug therapy: Secondary | ICD-10-CM

## 2019-02-07 DIAGNOSIS — Z20828 Contact with and (suspected) exposure to other viral communicable diseases: Secondary | ICD-10-CM | POA: Diagnosis present

## 2019-02-07 LAB — BASIC METABOLIC PANEL
Anion gap: 10 (ref 5–15)
BUN: 12 mg/dL (ref 8–23)
CO2: 28 mmol/L (ref 22–32)
Calcium: 9.1 mg/dL (ref 8.9–10.3)
Chloride: 98 mmol/L (ref 98–111)
Creatinine, Ser: 0.67 mg/dL (ref 0.44–1.00)
GFR calc Af Amer: >60 mL/min (ref >60)
GFR calc non Af Amer: >60 mL/min (ref >60)
Glucose, Bld: 122 mg/dL — ABNORMAL HIGH (ref 70–99)
Potassium: 3.8 mmol/L (ref 3.5–5.1)
Sodium: 136 mmol/L (ref 135–145)

## 2019-02-07 LAB — HEMOGLOBIN A1C
Hgb A1c MFr Bld: 6.3 % — ABNORMAL HIGH (ref 4.8–5.6)
Mean Plasma Glucose: 134.11 mg/dL

## 2019-02-07 LAB — CBC
HCT: 48.4 % — ABNORMAL HIGH (ref 36.0–46.0)
Hemoglobin: 14.8 g/dL (ref 12.0–15.0)
MCH: 27.7 pg (ref 26.0–34.0)
MCHC: 30.6 g/dL (ref 30.0–36.0)
MCV: 90.6 fL (ref 80.0–100.0)
Platelets: 281 10*3/uL (ref 150–400)
RBC: 5.34 MIL/uL — ABNORMAL HIGH (ref 3.87–5.11)
RDW: 14.3 % (ref 11.5–15.5)
WBC: 11 10*3/uL — ABNORMAL HIGH (ref 4.0–10.5)
nRBC: 0 % (ref 0.0–0.2)

## 2019-02-07 LAB — LIPID PANEL
Cholesterol: 192 mg/dL (ref 0–200)
HDL: 51 mg/dL (ref >40)
LDL Cholesterol: 117 mg/dL — ABNORMAL HIGH (ref 0–99)
Total CHOL/HDL Ratio: 3.8 RATIO
Triglycerides: 119 mg/dL (ref <150)
VLDL: 24 mg/dL (ref 0–40)

## 2019-02-07 LAB — CBG MONITORING, ED: Glucose-Capillary: 112 mg/dL — ABNORMAL HIGH (ref 70–99)

## 2019-02-07 MED ORDER — POTASSIUM CHLORIDE CRYS ER 20 MEQ PO TBCR: 20.0000 meq | EXTENDED_RELEASE_TABLET | Freq: Every day | ORAL | Status: DC

## 2019-02-07 MED ORDER — VITAMIN D 25 MCG (1000 UNIT) PO TABS
1000.0000 [IU] | ORAL_TABLET | Freq: Every morning | ORAL | Status: DC
  Administered 2019-02-08: 1000 [IU] via ORAL
  Filled 2019-02-07 (×4): qty 1

## 2019-02-07 MED ORDER — ALBUTEROL SULFATE (2.5 MG/3ML) 0.083% IN NEBU: 3.0000 mL | INHALATION_SOLUTION | Freq: Four times a day (QID) | RESPIRATORY_TRACT | Status: DC | PRN

## 2019-02-07 MED ORDER — ACETAMINOPHEN 650 MG RE SUPP: 650.0000 mg | RECTAL | Status: DC | PRN

## 2019-02-07 MED ORDER — SODIUM CHLORIDE 0.9 % IV SOLN
INTRAVENOUS | Status: DC
  Administered 2019-02-07: 21:00:00 via INTRAVENOUS

## 2019-02-07 MED ORDER — ADULT MULTIVITAMIN W/MINERALS CH
1.0000 | ORAL_TABLET | Freq: Every morning | ORAL | Status: DC
  Administered 2019-02-08: 1 via ORAL
  Filled 2019-02-07: qty 1

## 2019-02-07 MED ORDER — METOPROLOL TARTRATE 25 MG PO TABS
25.0000 mg | ORAL_TABLET | Freq: Two times a day (BID) | ORAL | Status: DC
  Administered 2019-02-07 – 2019-02-08 (×2): 25 mg via ORAL
  Filled 2019-02-07 (×2): qty 1

## 2019-02-07 MED ORDER — INFLUENZA VAC A&B SA ADJ QUAD 0.5 ML IM PRSY
0.5000 mL | PREFILLED_SYRINGE | INTRAMUSCULAR | Status: DC
  Filled 2019-02-07: qty 0.5

## 2019-02-07 MED ORDER — STROKE: EARLY STAGES OF RECOVERY BOOK
Freq: Once | Status: DC
  Filled 2019-02-07: qty 1

## 2019-02-07 MED ORDER — POLYETHYL GLYCOL-PROPYL GLYCOL 0.4-0.3 % OP GEL
Freq: Every day | OPHTHALMIC | Status: DC | PRN
  Filled 2019-02-07: qty 10

## 2019-02-07 MED ORDER — APIXABAN 5 MG PO TABS
5.0000 mg | ORAL_TABLET | Freq: Two times a day (BID) | ORAL | Status: DC
  Administered 2019-02-07 – 2019-02-08 (×2): 5 mg via ORAL
  Filled 2019-02-07 (×2): qty 1

## 2019-02-07 MED ORDER — SODIUM CHLORIDE 0.9 % IV SOLN: INTRAVENOUS | Status: DC

## 2019-02-07 MED ORDER — SALINE SPRAY 0.65 % NA SOLN
1.0000 | NASAL | Status: DC | PRN
  Filled 2019-02-07: qty 44

## 2019-02-07 MED ORDER — INSULIN ASPART 100 UNIT/ML ~~LOC~~ SOLN: 0.0000 [IU] | Freq: Three times a day (TID) | SUBCUTANEOUS | Status: DC

## 2019-02-07 MED ORDER — ACETAMINOPHEN 325 MG PO TABS: 650.0000 mg | ORAL_TABLET | ORAL | Status: DC | PRN

## 2019-02-07 MED ORDER — POLYVINYL ALCOHOL 1.4 % OP SOLN: 1.0000 [drp] | OPHTHALMIC | Status: DC | PRN

## 2019-02-07 MED ORDER — ACETAMINOPHEN 160 MG/5ML PO SOLN: 650.0000 mg | ORAL | Status: DC | PRN

## 2019-02-07 MED ORDER — SENNOSIDES-DOCUSATE SODIUM 8.6-50 MG PO TABS: 1.0000 | ORAL_TABLET | Freq: Every evening | ORAL | Status: DC | PRN

## 2019-02-07 NOTE — Telephone Encounter (Signed)
Called pt. No answer, left message for pt to return call.  

## 2019-02-07 NOTE — ED Notes (Signed)
Family at bedside. 

## 2019-02-07 NOTE — H&P (Addendum)
History and Physical  Nicole Cabrera [id number] DOB: 1945-08-20 DOA: 02/07/2019  Referring physician: Dr.Zackowski  PCP: Sandi Mealy, MD   Chief Complaint: vision and speech difficulties   HPI: Nicole Cabrera is a 73 y.o. female with prior cerebrovascular disease, atrial fibrillation fully anticoagulated with apixaban, diabetes mellitus and hypertension and other comorbidities detailed below presented to the ED yesterday 02/06/2019 with vision changes that she described as water behind the eyes and a wavy vision associated with difficulty recalling words and some speech difficulties.  She received a CT of the head at that time that did not show any acute findings.  A code stroke was called and the patient was seen by teleneurologist who at that time decided that patient was not a candidate for TPA because she was on Eliquis and because of resolution of the symptoms.  They recommended at that time that the patient be admitted for TIA stroke work-up however the patient declined and wanted to go home.  The patient did not want to have an MRI due to a prior bad experience.  Her family took her home.  Apparently after the patient got home she continued to have difficulty recalling words and speech difficulties and vision changes.  She also developed a left anterior forehead headache earlier this morning.  She returned to the emergency department a repeat CT scan was done with no acute findings.  She agreed at that time to have an MRI of the brain.  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  The patient is being admitted for full stroke work-up.  The patient reports that her symptoms have mostly resolved completely at this time.  Review of Systems: All systems reviewed and apart from history of presenting illness, are negative.  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   Diabetes mellitus without complication ([place])    stated not diabetic but her AIC has been high so she is monitored closely    Endometrial polyp 04/2000   Fall 01/2018   nose fracture    Heart murmur    Hemorrhoids    Hypertension    Kidney stones    Obesity    Osteopenia 05/2007   Paroxysmal atrial fibrillation ([place])    Peripheral neuropathy    PMB (postmenopausal bleeding) 04/2000   SAB (spontaneous abortion) 1981   Sleep apnea    CPAP   Stroke ([place])    SVD (spontaneous vaginal delivery) 04/1964, 05/1978   x3   TIA (transient ischemic attack)    Past Surgical History:  Procedure Laterality Date   CATARACT EXTRACTION Right 12/07/2018   CERVICAL [place] SURGERY  2/93   CESAREAN SECTION  6/83   with BTL   COLONOSCOPY N/A 03/19/2017   Procedure: COLONOSCOPY;  Surgeon: Doran Stabler, MD;  Location: [facility];  Service: Gastroenterology;  Laterality: N/A;   HYSTEROSCOPY  5/02, 01/2011   LITHOTRIPSY  08/30/2014   with Dr. Jeffie Pollock at [facility] Right 6/08   right   TOTAL KNEE ARTHROPLASTY Left 4/10   left  WRIST SURGERY Left 4/07   left   Social History:  reports that she has never smoked. She has never used smokeless tobacco. She reports that she does not drink alcohol or use drugs.  Allergies  Allergen Reactions   Bactrim [Sulfamethoxazole-Trimethoprim] Other (See Comments)    Unknown    Ciprofloxacin Other (See Comments)    GI upset   Hydroxyzine Hcl     Other reaction(s): Other (See Comments) Pt becomes physchotic.   Keflet [Cephalexin] Other (See Comments)    GI upset   Lipitor [Atorvastatin]     Muscle cramps   Lisinopril Cough   Spironolactone Nausea Only   Vistaril [Hydroxyzine  Hcl] Other (See Comments)    Pt becomes physchotic.   Erythromycin Rash   Latex Rash   Penicillins Rash    Has patient had a PCN reaction causing immediate rash, facial/tongue/throat swelling, SOB or lightheadedness with hypotension: Yes Has patient had a PCN reaction causing severe rash involving mucus membranes or skin necrosis: No Has patient had a PCN reaction that required hospitalization No Has patient had a PCN reaction occurring within the last 10 years: No If all of the above answers are "NO", then may proceed with Cephalosporin use.    Tape Itching, Rash and Other (See Comments)    Burning Burning   Tetracycline Rash    Family History  Problem Relation Age of Onset   Heart disease Father    Colon cancer Mother    Heart disease Mother    Breast cancer Maternal Aunt    Colon cancer Maternal Aunt    Colon cancer Maternal Uncle    Ehlers-Danlos syndrome Other        neice-being tested 4/15   Stomach cancer Neg Hx    Pancreatic cancer Neg Hx     Prior to Admission medications   Medication Sig Start Date End Date Taking? Authorizing Provider  acetaminophen (TYLENOL) 500 MG tablet Take 1,000 mg by mouth 2 (two) times daily as needed for headache.   Yes [provider]  apixaban (ELIQUIS) 5 MG TABS tablet Take 1 tablet (5 mg total) by mouth 2 (two) times daily. 09/26/18  Yes Branch, Alphonse Guild, MD  chlorthalidone (HYGROTON) 25 MG tablet Take 1 tablet (25 mg total) by mouth daily. 12/16/18 03/16/19 Yes BranchAlphonse Guild, MD  cholecalciferol (VITAMIN D) 1000 UNITS tablet Take 1,000 Units by mouth every morning.    Yes [provider]  losartan (COZAAR) 100 MG tablet Take 1 tablet (100 mg total) by mouth daily. 12/16/18 03/16/19 Yes Branch, Alphonse Guild, MD  metoprolol tartrate (LOPRESSOR) 25 MG tablet Take 1 tablet (25 mg total) by mouth 2 (two) times daily. May take an extra tablet as needed for palpitations 03/24/18  Yes Strader, [place], PA-C    Multiple Vitamins-Minerals (MULTIVITAMIN WITH MINERALS) tablet Take 1 tablet by mouth every morning. Centrum Silver Womens 50+   Yes [provider]  Polyethyl Glycol-Propyl Glycol (SYSTANE OP) Place 1 drop into both eyes daily as needed (dry eyes).    Yes [provider]  potassium chloride SA (K-DUR) 20 MEQ tablet TAKE ONE TABLET BY MOUTH DAILY. Patient taking differently: Take 20 mEq by mouth daily.  10/13/18  Yes Branch, Alphonse Guild, MD  PROAIR HFA 108 [phone] Base) MCG/ACT inhaler INHALE TWO PUFFS BY MOUTH EVERY 6 HOURS AS NEEDED FOR WHEEZING AND SHORTNESS OF BREATH Patient taking differently: Inhale 2 puffs into the lungs every 6 (six) hours as needed.  11/16/17  Yes Baird Lyons D, MD  sodium chloride (OCEAN) 0.65 % SOLN nasal spray Place 1 spray into both nostrils as needed for congestion.   Yes [provider]  ALPRAZolam (XANAX) 0.25 MG tablet Take 0.25 mg by mouth daily as needed for anxiety.  02/01/18   [provider]   Physical Exam: Vitals:   02/07/19 0925 02/07/19 1032 02/07/19 1206 02/07/19 1230  BP: (!) 164/102  136/79 129/74  Pulse: 70 66 68 69  Resp: 20     Temp: 97.7 F (36.5 C)     TempSrc: Oral     SpO2: 97% 96% 93% 95%  Weight:      Height:         General exam: Moderately built and nourished patient, lying comfortably supine on the gurney in no obvious distress.  Head, eyes and ENT: Nontraumatic and normocephalic. Pupils equally reacting to light and accommodation. Oral mucosa moist.  Neck: Supple. No JVD, carotid bruit or thyromegaly.  Lymphatics: No lymphadenopathy.  Respiratory system: Clear to auscultation. No increased work of breathing.  Cardiovascular system: S1 and S2 heard, RRR. No JVD, murmurs, gallops, clicks or pedal edema.  Gastrointestinal system: Abdomen is nondistended, soft and nontender. Normal bowel sounds heard. No organomegaly or masses appreciated.  Central nervous system: Alert and oriented. No  focal neurological deficits.  Extremities: Symmetric 5 x 5 power. Peripheral pulses symmetrically felt.   Skin: No rashes or acute findings.  Musculoskeletal system: Negative exam.  Psychiatry: Pleasant and cooperative.   Labs on Admission:  Basic Metabolic Panel: Recent Labs  Lab 02/01/19 1227 02/06/19 1649 02/06/19 1650 02/07/19 1056  NA 138 133* 139 136  K 3.8 3.1* 3.3* 3.8  CL 100 98 98 98  CO2 27 27  --  28  GLUCOSE 104* 113* 109* 122*  BUN 16 15 15 12   CREATININE 0.83 0.68 0.80 0.67  CALCIUM 9.1 8.7*  --  9.1  MG 2.0  --   --   --    Liver Function Tests: Recent Labs  Lab 02/06/19 1649  AST 21  ALT 17  ALKPHOS 83  BILITOT 0.4  PROT 7.0  ALBUMIN 3.8   No results for input(s): LIPASE, AMYLASE in the last 168 hours. No results for input(s): AMMONIA in the last 168 hours. CBC: Recent Labs  Lab 02/06/19 1649 02/06/19 1650 02/07/19 1056  WBC 11.1*  --  11.0*  NEUTROABS 6.7  --   --   HGB 14.7 15.6* 14.8  HCT 47.2* 46.0 48.4*  MCV 89.4  --  90.6  PLT 258  --  281   Cardiac Enzymes: No results for input(s): CKTOTAL, CKMB, CKMBINDEX, TROPONINI in the last 168 hours.  BNP (last 3 results) No results for input(s): PROBNP in the last 8760 hours. CBG: Recent Labs  Lab 02/06/19 1643  GLUCAP 98    Radiological Exams on Admission: Ct Head Wo Contrast  Result Date: 02/07/2019 CLINICAL DATA:  Acute onset headache.  Recent dysarthria EXAM: CT HEAD WITHOUT CONTRAST TECHNIQUE: Contiguous axial images were obtained from the base of the skull through the vertex without intravenous contrast. COMPARISON:  February 06, 2019 and January 14, 2019 FINDINGS: Brain: There is age related volume loss. There is no intracranial mass, hemorrhage, extra-axial fluid collection, or midline shift. There is evidence of a prior small infarct in the inferior left parietal lobe, stable. There is patchy small vessel disease in the centra semiovale bilaterally, stable. No acute infarct  evident on this study. Vascular:  No hyperdense vessel. There is slight vascular calcification in the carotid siphon regions bilaterally as well as in each distal vertebral artery. Skull: The bony calvarium appears intact. Sinuses/Orbits: Visualized paranasal sinuses are clear. Orbits appear symmetric bilaterally. Other: Mastoid air cells are clear. IMPRESSION: Age related volume loss with patchy periventricular small vessel disease, stable. Prior small left parietal lobe infarct. No acute infarct. No mass or hemorrhage. There are foci of arterial vascular calcification. Electronically Signed   By: Lowella Grip III M.D.   On: 02/07/2019 10:10   Mr Angio Head Wo Contrast  Result Date: 02/07/2019 CLINICAL DATA:  Headache and dizziness over the last day. EXAM: MRI HEAD WITHOUT CONTRAST MRA HEAD WITHOUT CONTRAST TECHNIQUE: Multiplanar, multiecho pulse sequences of the brain and surrounding structures were obtained without intravenous contrast. Angiographic images of the head were obtained using MRA technique without contrast. COMPARISON:  Head CT same day.  MRI 01/18/2017 FINDINGS: MRI HEAD FINDINGS Brain: Diffusion imaging shows a punctate acute infarction in the medial right cerebellum. Small cluster of punctate infarctions are present in the left posterior limb internal capsule region. No other acute finding. No focal abnormality affects the brainstem. Old small vessel cerebellar infarction on the right. Cerebral hemispheres show old bilateral parieto-occipital cortical and subcortical infarctions which were acute in October of 2018. There are mild chronic small-vessel ischemic changes elsewhere affecting the hemispheric white matter. No mass lesion, hemorrhage, hydrocephalus or extra-axial collection. Vascular: Major vessels at the base of the brain show flow. Skull and upper cervical spine: Negative Sinuses/Orbits: Clear/normal Other: None MRA HEAD FINDINGS Both internal carotid arteries are patent through  the skull base and siphon regions. The left internal carotid artery supplies the left middle cerebral artery territory and the predominant supply to both anterior cerebral artery territories. The A1 segment on the right is diminutive. Flow is present in both middle cerebral artery territories, but distal vessels show atherosclerotic narrowing and irregularity. Both vertebral arteries are widely patent to the basilar. No basilar stenosis. Posterior circulation branch vessels are patent. Left PCA is not as well shown on the reconstructed images but on the source images appears to be patent, with signal loss due to tortuosity. IMPRESSION: Subcentimeter acute infarction in the right medial cerebellar hemisphere. Small cluster of punctate acute infarctions in the posterior limb internal capsule on the left. Chronic small-vessel ischemic changes elsewhere affecting the brain. Old bilateral parieto-occipital cortical infarctions. No intracranial large or medium vessel occlusion. Distal vessel atherosclerotic irregularity consistent with small vessel change. Electronically Signed   By: Nelson Chimes M.D.   On: 02/07/2019 12:32   Mr Brain Wo Contrast (neuro Protocol)  Result Date: 02/07/2019 CLINICAL DATA:  Headache and dizziness over the last day. EXAM: MRI HEAD WITHOUT CONTRAST MRA HEAD WITHOUT CONTRAST TECHNIQUE: Multiplanar, multiecho pulse sequences of the brain and surrounding structures were obtained without intravenous contrast. Angiographic images of the head were obtained using MRA technique without contrast. COMPARISON:  Head CT same day.  MRI 01/18/2017 FINDINGS: MRI HEAD FINDINGS Brain: Diffusion imaging shows a punctate acute infarction in the medial right cerebellum. Small cluster of punctate infarctions are present in the left posterior limb internal capsule region. No other acute finding. No focal abnormality affects the brainstem. Old small vessel cerebellar infarction on the right. Cerebral hemispheres  show old bilateral parieto-occipital cortical and subcortical infarctions which were acute in October of 2018. There are mild chronic small-vessel ischemic changes elsewhere affecting the hemispheric white matter. No mass lesion, hemorrhage, hydrocephalus or extra-axial collection.  Vascular: Major vessels at the base of the brain show flow. Skull and upper cervical spine: Negative Sinuses/Orbits: Clear/normal Other: None MRA HEAD FINDINGS Both internal carotid arteries are patent through the skull base and siphon regions. The left internal carotid artery supplies the left middle cerebral artery territory and the predominant supply to both anterior cerebral artery territories. The A1 segment on the right is diminutive. Flow is present in both middle cerebral artery territories, but distal vessels show atherosclerotic narrowing and irregularity. Both vertebral arteries are widely patent to the basilar. No basilar stenosis. Posterior circulation branch vessels are patent. Left PCA is not as well shown on the reconstructed images but on the source images appears to be patent, with signal loss due to tortuosity. IMPRESSION: Subcentimeter acute infarction in the right medial cerebellar hemisphere. Small cluster of punctate acute infarctions in the posterior limb internal capsule on the left. Chronic small-vessel ischemic changes elsewhere affecting the brain. Old bilateral parieto-occipital cortical infarctions. No intracranial large or medium vessel occlusion. Distal vessel atherosclerotic irregularity consistent with small vessel change. Electronically Signed   By: Nelson Chimes M.D.   On: 02/07/2019 12:32   Ct Head Code Stroke Wo Contrast  Result Date: 02/06/2019 CLINICAL DATA:  Code stroke. Unexplained altered level of consciousness. Speech disturbance. Code stroke. Difficulty beginning 1530 hours EXAM: CT HEAD WITHOUT CONTRAST TECHNIQUE: Contiguous axial images were obtained from the base of the skull through the  vertex without intravenous contrast. COMPARISON:  01/14/2019 FINDINGS: Brain: Mild age related volume loss. Mild chronic small-vessel change of the hemispheric white matter. No sign of acute infarction, mass lesion, hemorrhage, hydrocephalus or extra-axial collection. Old small left inferior parietal cortical infarction. Vascular: There is atherosclerotic calcification of the major vessels at the base of the brain. Skull: Negative Sinuses/Orbits: Clear/normal Other: None ASPECTS ([place] Stroke Program Early CT Score) - Ganglionic level infarction (caudate, lentiform nuclei, internal capsule, insula, M1-M3 cortex): 7 - Supraganglionic infarction (M4-M6 cortex): 3 Total score (0-10 with 10 being normal): 10 IMPRESSION: 1. No acute finding by CT. Age related volume loss and chronic small-vessel change of the white matter. Old small left inferior parietal infarction. 2. ASPECTS is 10. 3. These results were called by telephone at the time of interpretation on 02/06/2019 at 4:45 pm to provider Nicole Cabrera , who verbally acknowledged these results. Electronically Signed   By: Nelson Chimes M.D.   On: 02/06/2019 16:46   EKG: Personally reviewed, sinus rhythm with PVCs  Assessment/Plan Principal Problem:   Acute CVA (cerebrovascular accident) [facility]) Active Problems:   Essential hypertension   Obstructive sleep apnea   Coagulopathy (HCC)   Anticoagulated   Visual changes   Leukocytosis   Hyperglycemia   Diabetes mellitus without complication ([place])  1. Acute brain infarctions in the right medial cerebellum and posterior internal capsule on the left-patient has been restarted on apixaban.  She will be sent for 2D echocardiogram.  MRA did not show large vessel abnormalities.  PT OT evaluation will be requested.  She has passed a stroke swallow screen and has been started on a diet.  SLP evaluation requested.  I have requested an inpatient neurology consultation.  We will allow for permissive hypertension in the  setting of acute CVA. 2. OSA-we will order for nightly CPAP while in hospital. 3. Dyslipidemia-check fasting lipid panel and resume statin therapy. 4. Type 2 diabetes mellitus-check hemoglobin A1c, cover blood glucose with supplemental sliding scale insulin. 5. Paroxysmal atrial fibrillation-patient is fully anticoagulated with apixaban which we have  continued.  Her rate is controlled with metoprolol 25 mg twice daily. 6. Essential hypertension-we will allow for permissive hypertension in the setting of acute CVA.  Holding all blood pressure medications except metoprolol which were using to control her heart rate from atrial fibrillation.  DVT Prophylaxis: Apixaban Code Status: Full Family Communication: Husband at bedside updated and verbalized understanding patient updated and verbalized understanding. Disposition Plan: Inpatient management for stroke work-up, subspecialty consultation  Time spent: 14  Minutes   Nicole Hasley Wynetta Emery, MD [facility] How to contact the [facility] Attending or Consulting provider [place] or covering provider during after hours [place], for this patient?  1. Check the care team in [facility] and look for a) attending/consulting [facility] provider listed and b) the [facility] team listed 2. Log into [email or web address] and use [place]'s universal password to access. If you do not have the password, please contact the hospital operator. 3. Locate the [facility] provider you are looking for under [facility] and page to a number that you can be directly reached. 4. If you still have difficulty reaching the provider, please page the [facility] (Director on Call) for the Hospitalists listed on amion for assistance.

## 2019-02-07 NOTE — ED Notes (Signed)
Patient transported to MRI 

## 2019-02-07 NOTE — ED Notes (Signed)
ED Provider at bedside. 

## 2019-02-07 NOTE — ED Notes (Signed)
Patient transported to CT 

## 2019-02-07 NOTE — Progress Notes (Signed)
Tried placing patient on CPAP, however we did not have nasal mask that patient usually uses.  Patient called husband to bring her personal machine from home.  I instructed patient's husband to bring to ED and they could call us and let us know when he gets here.  Will place patient on at that time.  No distress noted, continue to monitor.

## 2019-02-07 NOTE — ED Triage Notes (Signed)
PT in ED yesterday and code stroke was called because pt having difficulty speaking and vision changes.  Neurologist recommended admission and MRI but pt has a huge fear of MRIs and requested to go home.  SPouse says pt didn't sleep well last night and woke up 0600 with headache and speech unclear.  REports headache is intermittent and on left side of forehead.  Denies any weakness, numbness, or tingling in extremities.

## 2019-02-07 NOTE — ED Notes (Signed)
Patient denies pain and is resting comfortably.  

## 2019-02-07 NOTE — Progress Notes (Signed)
CODE STROKE CT TIME FROM 02/06/2019 1623 CALL TIME 1628 EXAM STARTED 1630 EXAM FINISHED 1630 IMAGES SENT TO SOC 1630 EXAM COMPLETE IN EPIC [place] [facility]

## 2019-02-07 NOTE — Telephone Encounter (Signed)
-----   Message from Arnoldo Lenis, MD sent at 02/07/2019  8:46 AM EST ----- Normal labs   Zandra Abts MD

## 2019-02-07 NOTE — ED Provider Notes (Signed)
[facility] EMERGENCY DEPARTMENT Provider Note   CSN: [id number] Arrival date & time: 02/07/19  [id number]     History   Chief Complaint Chief Complaint  Patient presents with   Cerebrovascular Accident    HPI Nicole Cabrera is a 73 y.o. female.     Patient with acute onset at 71 yesterday afternoon with some difficulty speaking some visual problems and little bit of coordination problems.  Patient came to the emergency department.  They did head CT that did not show any acute stroke showed evidence of her old stroke.  They recommended MRI patient would do the MRI.  They recommend admission patient did not want to do the admission so patient was discharged home.  Today developed a left anterior forehead headache.  Patient's son states that she still seems to be having some word problems and is having some coordination problems.  I discussion with patient we repeated head CT.  Did not show anything acute other than the old findings she did agree to MRI at this time.  MRI ordered.  Patient is on Xarelto presume probably for atrial fib.  Patient has diabetes.     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   Diabetes mellitus without complication ([place])    stated not diabetic but her AIC has been high so she is monitored closely    Endometrial polyp 04/2000   Fall 01/2018   nose fracture    Heart murmur    Hemorrhoids    Hypertension    Kidney stones    Obesity    Osteopenia 05/2007   Paroxysmal atrial fibrillation ([place])    Peripheral neuropathy    PMB (postmenopausal bleeding) 04/2000   SAB (spontaneous abortion) 1981   Sleep apnea    CPAP   Stroke ([place])    SVD (spontaneous vaginal delivery) 04/1964, 05/1978   x3   TIA (transient  ischemic attack)     Patient Active Problem List   Diagnosis Date Noted   Acute CVA (cerebrovascular accident) ([place]) 02/07/2019   Coagulopathy ([place]) 02/07/2019   Anticoagulated 02/07/2019   Visual changes 02/07/2019   Leukocytosis 02/07/2019   Hyperglycemia 02/07/2019   Diabetes mellitus without complication ([place])    Hypokalemia 03/19/2017   HLD (hyperlipidemia) 03/19/2017   Hematochezia    Acute blood loss anemia    Rectal bleeding 03/18/2017   Acute GI bleeding    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   Stroke (cerebrum) ([place]) 01/18/2017   Intracerebral hemorrhage 01/18/2017   Elevated troponin 01/17/2017   Atrial fibrillation ([place]) 10/27/2016   Obesity 06/20/2015   Nephrolithiasis 07/09/2014   Asthma with bronchitis 02/23/2014   Aphasia 02/27/2013   ACUTE BRONCHITIS 08/30/2009   Essential hypertension 02/22/2007   Obstructive sleep apnea 02/22/2007   ALLERGY 02/22/2007    Past Surgical History:  Procedure Laterality Date   CATARACT EXTRACTION Right 12/07/2018   CERVICAL [place] SURGERY  2/93   CESAREAN SECTION  6/83   with BTL   COLONOSCOPY N/A 03/19/2017   Procedure: COLONOSCOPY;  Surgeon: Doran Stabler, MD;  Location: [place];  Service: Gastroenterology;  Laterality: N/A;   HYSTEROSCOPY  5/02, 01/2011   LITHOTRIPSY  08/30/2014  with Dr. Jeffie Pollock at [facility] Right 6/08   right   TOTAL KNEE ARTHROPLASTY Left 4/10   left    WRIST SURGERY Left 4/07   left     OB History    Gravida  5   Para  3   Term      Preterm      AB  2   Living  3     SAB  1   TAB  1   Ectopic      Multiple      Live Births               Home Medications    Prior to Admission medications   Medication Sig Start Date End Date Taking? Authorizing Provider  acetaminophen (TYLENOL) 500 MG tablet Take 1,000 mg by mouth 2 (two) times daily as needed for headache.   Yes [provider]  apixaban (ELIQUIS) 5 MG TABS tablet Take 1 tablet (5 mg total) by mouth 2 (two) times daily. 09/26/18  Yes Branch, Alphonse Guild, MD  chlorthalidone (HYGROTON) 25 MG tablet Take 1 tablet (25 mg total) by mouth daily. 12/16/18 03/16/19 Yes BranchAlphonse Guild, MD  cholecalciferol (VITAMIN D) 1000 UNITS tablet Take 1,000 Units by mouth every morning.    Yes [provider]  losartan (COZAAR) 100 MG tablet Take 1 tablet (100 mg total) by mouth daily. 12/16/18 03/16/19 Yes Branch, Alphonse Guild, MD  metoprolol tartrate (LOPRESSOR) 25 MG tablet Take 1 tablet (25 mg total) by mouth 2 (two) times daily. May take an extra tablet as needed for palpitations 03/24/18  Yes Strader, [place], PA-C  Multiple Vitamins-Minerals (MULTIVITAMIN WITH MINERALS) tablet Take 1 tablet by mouth every morning. Centrum Silver Womens 50+   Yes [provider]  Polyethyl Glycol-Propyl Glycol (SYSTANE OP) Place 1 drop into both eyes daily as needed (dry eyes).    Yes [provider]  potassium chloride SA (K-DUR) 20 MEQ tablet TAKE ONE TABLET BY MOUTH DAILY. Patient taking differently: Take 20 mEq by mouth daily.  10/13/18  Yes Branch, Alphonse Guild, MD  PROAIR HFA [phone] Base) MCG/ACT inhaler INHALE TWO PUFFS BY MOUTH EVERY 6 HOURS AS NEEDED FOR WHEEZING AND SHORTNESS OF BREATH Patient taking differently: Inhale 2 puffs into the lungs every 6 (six) hours as needed.  11/16/17  Yes Young, Tarri Fuller D, MD  sodium chloride (OCEAN) 0.65 % SOLN nasal spray Place 1 spray into both nostrils as needed for congestion.   Yes [provider]  ALPRAZolam (XANAX) 0.25 MG tablet Take 0.25 mg by mouth daily as needed for anxiety.  02/01/18   [provider]    Family History Family History  Problem Relation Age of Onset   Heart disease Father    Colon cancer Mother    Heart disease Mother    Breast cancer Maternal Aunt    Colon cancer Maternal Aunt    Colon cancer Maternal Uncle    Ehlers-Danlos  syndrome Other        neice-being tested 4/15   Stomach cancer Neg Hx    Pancreatic cancer Neg Hx     Social History Social History   Tobacco Use   Smoking status: Never Smoker   Smokeless tobacco: Never Used  Substance Use Topics   Alcohol use: No    Alcohol/week: 0.0 - 1.0 standard drinks   Drug use: No     Allergies   Bactrim [sulfamethoxazole-trimethoprim],  Ciprofloxacin, Hydroxyzine hcl, Keflet [cephalexin], Lipitor [atorvastatin], Lisinopril, Spironolactone, Vistaril [hydroxyzine hcl], Erythromycin, Latex, Penicillins, Tape, and Tetracycline   Review of Systems Review of Systems  Constitutional: Negative for chills and fever.  HENT: Negative for congestion, rhinorrhea and sore throat.   Eyes: Positive for visual disturbance.  Respiratory: Negative for cough and shortness of breath.   Cardiovascular: Negative for chest pain and leg swelling.  Gastrointestinal: Negative for abdominal pain, diarrhea, nausea and vomiting.  Genitourinary: Negative for dysuria.  Musculoskeletal: Negative for back pain and neck pain.  Skin: Negative for rash.  Neurological: Positive for speech difficulty, weakness and headaches. Negative for dizziness and light-headedness.  Hematological: Does not bruise/bleed easily.  Psychiatric/Behavioral: Negative for confusion.     Physical Exam Updated Vital Signs BP 129/74    Pulse 69    Temp 97.7 F (36.5 C) (Oral)    Resp 20    Ht 1.499 m (4\' 11" )    Wt 114 kg    LMP 01/22/2011    SpO2 95%    BMI 50.76 kg/m   Physical Exam Vitals signs and nursing note reviewed.  Constitutional:      General: She is not in acute distress.    Appearance: Normal appearance. She is well-developed.  HENT:     Head: Normocephalic and atraumatic.  Eyes:     Extraocular Movements: Extraocular movements intact.     Conjunctiva/sclera: Conjunctivae normal.     Pupils: Pupils are equal, round, and reactive to light.  Neck:     Musculoskeletal: Normal  range of motion and neck supple.  Cardiovascular:     Rate and Rhythm: Normal rate and regular rhythm.     Heart sounds: No murmur.  Pulmonary:     Effort: Pulmonary effort is normal. No respiratory distress.     Breath sounds: Normal breath sounds.  Abdominal:     Palpations: Abdomen is soft.     Tenderness: There is no abdominal tenderness.  Musculoskeletal: Normal range of motion.  Skin:    General: Skin is warm and dry.  Neurological:     Mental Status: She is alert and oriented to person, place, and time.     Cranial Nerves: Cranial nerve deficit present.     Coordination: Coordination abnormal.      ED Treatments / Results  Labs (all labs ordered are listed, but only abnormal results are displayed) Labs Reviewed  CBC - Abnormal; Notable for the following components:      Result Value   WBC 11.0 (*)    RBC 5.34 (*)    HCT 48.4 (*)    All other components within normal limits  BASIC METABOLIC PANEL - Abnormal; Notable for the following components:   Glucose, Bld 122 (*)    All other components within normal limits  SARS CORONAVIRUS 2 (TAT 6-24 HRS)    EKG None   ED ECG REPORT   Date: 02/07/2019  Rate: 69  Rhythm: normal sinus rhythm  QRS Axis: right  Intervals: PR prolonged  ST/T Wave abnormalities: normal  Conduction Disutrbances:none  Narrative Interpretation:   Old EKG Reviewed: none available  I have personally reviewed the EKG tracing and agree with the computerized printout as noted. Also has multiple premature ventricular complexes.  Radiology Ct Head Wo Contrast  Result Date: 02/07/2019 CLINICAL DATA:  Acute onset headache.  Recent dysarthria EXAM: CT HEAD WITHOUT CONTRAST TECHNIQUE: Contiguous axial images were obtained from the base of the skull through the vertex without intravenous contrast. COMPARISON:  February 06, 2019 and January 14, 2019 FINDINGS: Brain: There is age related volume loss. There is no intracranial mass, hemorrhage,  extra-axial fluid collection, or midline shift. There is evidence of a prior small infarct in the inferior left parietal lobe, stable. There is patchy small vessel disease in the centra semiovale bilaterally, stable. No acute infarct evident on this study. Vascular: No hyperdense vessel. There is slight vascular calcification in the carotid siphon regions bilaterally as well as in each distal vertebral artery. Skull: The bony calvarium appears intact. Sinuses/Orbits: Visualized paranasal sinuses are clear. Orbits appear symmetric bilaterally. Other: Mastoid air cells are clear. IMPRESSION: Age related volume loss with patchy periventricular small vessel disease, stable. Prior small left parietal lobe infarct. No acute infarct. No mass or hemorrhage. There are foci of arterial vascular calcification. Electronically Signed   By: Lowella Grip III M.D.   On: 02/07/2019 10:10   Mr Angio Head Wo Contrast  Result Date: 02/07/2019 CLINICAL DATA:  Headache and dizziness over the last day. EXAM: MRI HEAD WITHOUT CONTRAST MRA HEAD WITHOUT CONTRAST TECHNIQUE: Multiplanar, multiecho pulse sequences of the brain and surrounding structures were obtained without intravenous contrast. Angiographic images of the head were obtained using MRA technique without contrast. COMPARISON:  Head CT same day.  MRI 01/18/2017 FINDINGS: MRI HEAD FINDINGS Brain: Diffusion imaging shows a punctate acute infarction in the medial right cerebellum. Small cluster of punctate infarctions are present in the left posterior limb internal capsule region. No other acute finding. No focal abnormality affects the brainstem. Old small vessel cerebellar infarction on the right. Cerebral hemispheres show old bilateral parieto-occipital cortical and subcortical infarctions which were acute in October of 2018. There are mild chronic small-vessel ischemic changes elsewhere affecting the hemispheric white matter. No mass lesion, hemorrhage, hydrocephalus or  extra-axial collection. Vascular: Major vessels at the base of the brain show flow. Skull and upper cervical spine: Negative Sinuses/Orbits: Clear/normal Other: None MRA HEAD FINDINGS Both internal carotid arteries are patent through the skull base and siphon regions. The left internal carotid artery supplies the left middle cerebral artery territory and the predominant supply to both anterior cerebral artery territories. The A1 segment on the right is diminutive. Flow is present in both middle cerebral artery territories, but distal vessels show atherosclerotic narrowing and irregularity. Both vertebral arteries are widely patent to the basilar. No basilar stenosis. Posterior circulation branch vessels are patent. Left PCA is not as well shown on the reconstructed images but on the source images appears to be patent, with signal loss due to tortuosity. IMPRESSION: Subcentimeter acute infarction in the right medial cerebellar hemisphere. Small cluster of punctate acute infarctions in the posterior limb internal capsule on the left. Chronic small-vessel ischemic changes elsewhere affecting the brain. Old bilateral parieto-occipital cortical infarctions. No intracranial large or medium vessel occlusion. Distal vessel atherosclerotic irregularity consistent with small vessel change. Electronically Signed   By: Nelson Chimes M.D.   On: 02/07/2019 12:32   Mr Brain Wo Contrast (neuro Protocol)  Result Date: 02/07/2019 CLINICAL DATA:  Headache and dizziness over the last day. EXAM: MRI HEAD WITHOUT CONTRAST MRA HEAD WITHOUT CONTRAST TECHNIQUE: Multiplanar, multiecho pulse sequences of the brain and surrounding structures were obtained without intravenous contrast. Angiographic images of the head were obtained using MRA technique without contrast. COMPARISON:  Head CT same day.  MRI 01/18/2017 FINDINGS: MRI HEAD FINDINGS Brain: Diffusion imaging shows a punctate acute infarction in the medial right cerebellum. Small  cluster of punctate infarctions are present in  the left posterior limb internal capsule region. No other acute finding. No focal abnormality affects the brainstem. Old small vessel cerebellar infarction on the right. Cerebral hemispheres show old bilateral parieto-occipital cortical and subcortical infarctions which were acute in October of 2018. There are mild chronic small-vessel ischemic changes elsewhere affecting the hemispheric white matter. No mass lesion, hemorrhage, hydrocephalus or extra-axial collection. Vascular: Major vessels at the base of the brain show flow. Skull and upper cervical spine: Negative Sinuses/Orbits: Clear/normal Other: None MRA HEAD FINDINGS Both internal carotid arteries are patent through the skull base and siphon regions. The left internal carotid artery supplies the left middle cerebral artery territory and the predominant supply to both anterior cerebral artery territories. The A1 segment on the right is diminutive. Flow is present in both middle cerebral artery territories, but distal vessels show atherosclerotic narrowing and irregularity. Both vertebral arteries are widely patent to the basilar. No basilar stenosis. Posterior circulation branch vessels are patent. Left PCA is not as well shown on the reconstructed images but on the source images appears to be patent, with signal loss due to tortuosity. IMPRESSION: Subcentimeter acute infarction in the right medial cerebellar hemisphere. Small cluster of punctate acute infarctions in the posterior limb internal capsule on the left. Chronic small-vessel ischemic changes elsewhere affecting the brain. Old bilateral parieto-occipital cortical infarctions. No intracranial large or medium vessel occlusion. Distal vessel atherosclerotic irregularity consistent with small vessel change. Electronically Signed   By: Nelson Chimes M.D.   On: 02/07/2019 12:32   Ct Head Code Stroke Wo Contrast  Result Date: 02/06/2019 CLINICAL DATA:   Code stroke. Unexplained altered level of consciousness. Speech disturbance. Code stroke. Difficulty beginning 1530 hours EXAM: CT HEAD WITHOUT CONTRAST TECHNIQUE: Contiguous axial images were obtained from the base of the skull through the vertex without intravenous contrast. COMPARISON:  01/14/2019 FINDINGS: Brain: Mild age related volume loss. Mild chronic small-vessel change of the hemispheric white matter. No sign of acute infarction, mass lesion, hemorrhage, hydrocephalus or extra-axial collection. Old small left inferior parietal cortical infarction. Vascular: There is atherosclerotic calcification of the major vessels at the base of the brain. Skull: Negative Sinuses/Orbits: Clear/normal Other: None ASPECTS ([place] Stroke Program Early CT Score) - Ganglionic level infarction (caudate, lentiform nuclei, internal capsule, insula, M1-M3 cortex): 7 - Supraganglionic infarction (M4-M6 cortex): 3 Total score (0-10 with 10 being normal): 10 IMPRESSION: 1. No acute finding by CT. Age related volume loss and chronic small-vessel change of the white matter. Old small left inferior parietal infarction. 2. ASPECTS is 10. 3. These results were called by telephone at the time of interpretation on 02/06/2019 at 4:45 pm to provider JOSEPH ZAMMIT , who verbally acknowledged these results. Electronically Signed   By: Nelson Chimes M.D.   On: 02/06/2019 16:46    Procedures Procedures (including critical care time)  Medications Ordered in ED Medications  0.9 %  sodium chloride infusion ( Intravenous Not Given 02/07/19 1242)     Initial Impression / Assessment and Plan / ED Course  I have reviewed the triage vital signs and the nursing notes.  Pertinent labs & imaging results that were available during my care of the patient were reviewed by me and considered in my medical decision making (see chart for details).        MRI/MRA confirmed new stroke pattern in the cerebellum and internal capsule.  Patient  informed.  Neurology consulted but they have not called back.  Hospitalist consulted.  They will admit patient for stroke.  Patient not in the TPA window symptom onset was yesterday at 330.  MRA showed no evidence of any large vessel abnormalities.  Final Clinical Impressions(s) / ED Diagnoses   Final diagnoses:  Cerebrovascular accident (CVA), unspecified mechanism [facility])    ED Discharge Orders    None       Fredia Sorrow, MD 02/07/19 1557

## 2019-02-08 ENCOUNTER — Inpatient Hospital Stay (HOSPITAL_COMMUNITY): Payer: BC Managed Care – PPO

## 2019-02-08 DIAGNOSIS — I639 Cerebral infarction, unspecified: Principal | ICD-10-CM

## 2019-02-08 DIAGNOSIS — I6389 Other cerebral infarction: Secondary | ICD-10-CM

## 2019-02-08 DIAGNOSIS — R270 Ataxia, unspecified: Secondary | ICD-10-CM | POA: Diagnosis not present

## 2019-02-08 DIAGNOSIS — H539 Unspecified visual disturbance: Secondary | ICD-10-CM

## 2019-02-08 DIAGNOSIS — R471 Dysarthria and anarthria: Secondary | ICD-10-CM | POA: Diagnosis not present

## 2019-02-08 DIAGNOSIS — Z7901 Long term (current) use of anticoagulants: Secondary | ICD-10-CM

## 2019-02-08 DIAGNOSIS — I10 Essential (primary) hypertension: Secondary | ICD-10-CM

## 2019-02-08 LAB — ECHOCARDIOGRAM COMPLETE
Height: 59 in
Weight: 4021.19 oz

## 2019-02-08 LAB — SARS CORONAVIRUS 2 (TAT 6-24 HRS): SARS Coronavirus 2: NEGATIVE

## 2019-02-08 LAB — GLUCOSE, CAPILLARY: Glucose-Capillary: 117 mg/dL — ABNORMAL HIGH (ref 70–99)

## 2019-02-08 MED ORDER — ASPIRIN 81 MG PO CHEW
81.0000 mg | CHEWABLE_TABLET | Freq: Every day | ORAL | Status: DC
  Administered 2019-02-08: 81 mg via ORAL
  Filled 2019-02-08: qty 1

## 2019-02-08 MED ORDER — PRAVASTATIN SODIUM 40 MG PO TABS
40.0000 mg | ORAL_TABLET | Freq: Every day | ORAL | Status: DC
  Administered 2019-02-08: 40 mg via ORAL
  Filled 2019-02-08: qty 1

## 2019-02-08 NOTE — Progress Notes (Signed)
Patient wanted to leave. Unable to wait visit from neurologist. Patient is alert and oriented. Expressed understanding. Signed paperwork. Left with husband. Paged Dr. Maudie Mercury to make aware.

## 2019-02-08 NOTE — Evaluation (Signed)
Occupational Therapy Evaluation Patient Details Name: Nicole Cabrera MRN: [id number] DOB: 01-01-1946 Today's Date: 02/08/2019    History of Present Illness Nicole Cabrera is a 73 y.o. female with prior cerebrovascular disease, atrial fibrillation fully anticoagulated with apixaban, diabetes mellitus and hypertension and other comorbidities detailed below presented to the ED yesterday 02/06/2019 with vision changes that she described as water behind the eyes and a wavy vision associated with difficulty recalling words and some speech difficulties.  She received a CT of the head at that time that did not show any acute findings. Apparently after the patient got home she continued to have difficulty recalling words and speech difficulties and vision changes.  She also developed a left anterior forehead headache earlier this morning.  She returned to the emergency department a repeat CT scan was done with no acute findings.  She agreed at that time to have an MRI of the brain.  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  The patient is being admitted for full stroke work-up.  The patient reports that her symptoms have mostly resolved completely at this time.   Clinical Impression   Pt agreeable to OT evaluation this am. Pt in bathroom on OT arrival, complete toileting and hygiene tasks without difficulty. Pt completing functional mobility while managing IV pole with minimal difficulty. Pt appears to be at baseline with ADL completion. BUE strength is WFL, coordination and sensation is intact. No further OT services required at this time.     Follow Up Recommendations  No OT follow up    Equipment Recommendations  None recommended by OT       Precautions / Restrictions Precautions Precautions: Fall Restrictions Weight Bearing Restrictions: No      Mobility Bed Mobility Overal bed  mobility: Modified Independent                Transfers Overall transfer level: Modified independent Equipment used: Straight cane                      ADL either performed or assessed with clinical judgement   ADL Overall ADL's : Needs assistance/impaired     Grooming: Wash/dry hands;Supervision/safety;Standing Grooming Details (indicate cue type and reason): Standing at sink completing grooming without difficulty             Lower Body Dressing: Supervision/safety;Sitting/lateral leans   Toilet Transfer: Modified Independent;Ambulation;Regular Toilet   Toileting- Water quality scientist and Hygiene: Modified independent;Sitting/lateral lean;Sit to/from stand       Functional mobility during ADLs: Supervision/safety(managing IV pole)       Vision Baseline Vision/History: Wears glasses Wears Glasses: Reading only Patient Visual Report: No change from baseline Vision Assessment?: No apparent visual deficits Additional Comments: Vision symptoms have resolved at this time.            Pertinent Vitals/Pain Pain Assessment: No/denies pain     Hand Dominance Right   Extremity/Trunk Assessment Upper Extremity Assessment Upper Extremity Assessment: Overall WFL for tasks assessed(grossly 4+/5)   Lower Extremity Assessment Lower Extremity Assessment: Defer to PT evaluation   Cervical / Trunk Assessment Cervical / Trunk Assessment: Normal   Communication Communication Communication: No difficulties   Cognition Arousal/Alertness: Awake/alert Behavior During Therapy: WFL for tasks assessed/performed Overall Cognitive Status: Within Functional Limits for tasks assessed  Home Living Family/patient expects to be discharged to:: Private residence Living Arrangements: Spouse/significant other Available Help at Discharge: Family Type of Home: House Home Access: [place]: Multi-level;Laundry or work area in Building surveyor of Steps: 13 Alternate Level Stairs-Rails: Right;Left;Can reach both Bathroom Shower/Tub: Occupational psychologist: Standard     Home Equipment: Cane - single point;Bedside commode;Shower seat          Prior Functioning/Environment Level of Independence: Independent with assistive device(s)        Comments: Pt uses SPC for mobility. Independent in ADLs, housework, and drives        OT Problem List: Impaired vision/perception       End of Session    Activity Tolerance: Patient tolerated treatment well Patient left: in bed;with call bell/phone within reach;with nursing/sitter in room                   Time: 0730-0746 OT Time Calculation (min): 16 min Charges:  OT General Charges $OT Visit: 1 Visit OT Evaluation $OT Eval Low Complexity: [place], OTR/L  [phone] 02/08/2019, 7:53 AM

## 2019-02-08 NOTE — Evaluation (Signed)
Physical Therapy Evaluation Patient Details Name: Nicole Cabrera MRN: [id number] DOB: 1946-02-24 Today's Date: 02/08/2019   History of Present Illness  ABEGAIL Cabrera is a 73 y.o. female with prior cerebrovascular disease, atrial fibrillation fully anticoagulated with apixaban, diabetes mellitus and hypertension and other comorbidities detailed below presented to the ED yesterday 02/06/2019 with vision changes that she described as water behind the eyes and a wavy vision associated with difficulty recalling words and some speech difficulties.  She received a CT of the head at that time that did not show any acute findings. Apparently after the patient got home she continued to have difficulty recalling words and speech difficulties and vision changes.  She also developed a left anterior forehead headache earlier this morning.  She returned to the emergency department a repeat CT scan was done with no acute findings.  She agreed at that time to have an MRI of the brain.  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  The patient is being admitted for full stroke work-up.  The patient reports that her symptoms have mostly resolved completely at this time.    Clinical Impression  Patient functioning near baseline for functional mobility and gait, no loss of balance during ambulation, limited mostly due to c/o fatigue and demonstrates good return for getting into/out of bed without assist.  Plan:  Patient discharged from physical therapy to care of nursing for ambulation daily as tolerated for length of stay.     Follow Up Recommendations No PT follow up    Equipment Recommendations  None recommended by PT    Recommendations for Other Services       Precautions / Restrictions Precautions Precautions: Fall Restrictions Weight Bearing Restrictions: No      Mobility  Bed  Mobility Overal bed mobility: Modified Independent                Transfers Overall transfer level: Modified independent Equipment used: Straight cane             General transfer comment: slightly increased time  Ambulation/Gait Ambulation/Gait assistance: Modified independent (Device/Increase time) Gait Distance (Feet): 100 Feet Assistive device: Straight cane Gait Pattern/deviations: WFL(Within Functional Limits);Step-through pattern Gait velocity: decreased   General Gait Details: slightly labored cadence without loss of balance with mostly 2 point gait pattern using [facility]  Stairs            Wheelchair Mobility    Modified Rankin (Stroke Patients Only)       Balance Overall balance assessment: Needs assistance Sitting-balance support: Feet supported;No upper extremity supported Sitting balance-Leahy Scale: Good Sitting balance - Comments: seated at EOB   Standing balance support: During functional activity;Single extremity supported Standing balance-Leahy Scale: Good Standing balance comment: using SPC                             Pertinent Vitals/Pain Pain Assessment: No/denies pain    Home Living Family/patient expects to be discharged to:: Private residence Living Arrangements: Spouse/significant other Available Help at Discharge: Family Type of Home: House Home Access: Ramped entrance     Home Layout: Multi-level;Laundry or work area in basement;Able to live on main level with bedroom/bathroom Home Equipment: Kasandra Knudsen - single point;Bedside commode;Shower seat;Walker - 2 wheels      Prior Function Level of Independence: Independent with assistive device(s)  Comments: Pt uses SPC for mobility. Independent in ADLs, housework, and drives     Hand Dominance   Dominant Hand: Right    Extremity/Trunk Assessment   Upper Extremity Assessment Upper Extremity Assessment: Defer to OT evaluation    Lower Extremity  Assessment Lower Extremity Assessment: Overall WFL for tasks assessed    Cervical / Trunk Assessment Cervical / Trunk Assessment: Normal  Communication   Communication: No difficulties  Cognition Arousal/Alertness: Awake/alert Behavior During Therapy: WFL for tasks assessed/performed Overall Cognitive Status: Within Functional Limits for tasks assessed                                        General Comments      Exercises     Assessment/Plan    PT Assessment Patent does not need any further PT services  PT Problem List         PT Treatment Interventions      PT Goals (Current goals can be found in the Care Plan section)  Acute Rehab PT Goals Patient Stated Goal: return home PT Goal Formulation: With patient Time For Goal Achievement: 02/08/19 Potential to Achieve Goals: Good    Frequency     Barriers to discharge        Co-evaluation               AM-PAC PT "6 Clicks" Mobility  Outcome Measure Help needed turning from your back to your side while in a flat bed without using bedrails?: None Help needed moving from lying on your back to sitting on the side of a flat bed without using bedrails?: None Help needed moving to and from a bed to a chair (including a wheelchair)?: None Help needed standing up from a chair using your arms (e.g., wheelchair or bedside chair)?: None Help needed to walk in hospital room?: None Help needed climbing 3-5 steps with a railing? : None 6 Click Score: 24    End of Session   Activity Tolerance: Patient tolerated treatment well;Patient limited by fatigue Patient left: in bed;with call bell/phone within reach(seated at bedside) Nurse Communication: Mobility status PT Visit Diagnosis: Unsteadiness on feet (R26.81);Other abnormalities of gait and mobility (R26.89);Muscle weakness (generalized) (M62.81)    Time: [id number] PT Time Calculation (min) (ACUTE ONLY): 23 min   Charges:   PT Evaluation $PT Eval  Moderate Complexity: 1 Mod PT Treatments $Therapeutic Activity: 23-37 mins        12:13 PM, 02/08/19 Lonell Grandchild, MPT Physical Therapist with [facility] [phone] office [phone] mobile phone

## 2019-02-08 NOTE — Progress Notes (Addendum)
PROGRESS NOTE  Nicole Cabrera [id number] DOB: Aug 02, 1945 DOA: 02/07/2019 PCP: Sandi Mealy, MD  Brief History:   73 y.o. female with prior cerebrovascular disease, atrial fibrillation fully anticoagulated with apixaban, diabetes mellitus and hypertension and other comorbidities detailed below presented to the ED 02/06/2019 with vision changes that she described as water behind the eyes and a wavy vision associated with difficulty recalling words and some speech difficulties.  She received a CT of the head at that time that did not show any acute findings.  A code stroke was called and the patient was seen by teleneurologist who at that time decided that patient was not a candidate for TPA because she was on Eliquis and because of resolution of the symptoms.  They recommended at that time that the patient be admitted for TIA stroke work-up however the patient declined and wanted to go home.  The patient did not want to have an MRI due to a prior bad experience.  Her family took her home.  Apparently after the patient got home she continued to have difficulty recalling words and speech difficulties and vision changes.  She also developed a left anterior forehead headache earlier this morning.  She returned to the emergency department 02/07/19.  A repeat CT scan was done with no acute findings.  She agreed at that time to have an MRI of the brain.  The MRI did show findings of acute infarct in the right medial cerebellum and acute infarctions in the posterior limb of the internal capsule on the left.  The patient is being admitted for full stroke work-up.  The patient reports that her symptoms have mostly resolved completely at this time.  Assessment/Plan: Acute Ischemic Stroke -Neurology Consult -PT/OT evaluation--no follow up -Speech therapy eval -CT brain--neg -MRI brain--acute infarct in the right medial cerebellum and acute infarctions in the posterior limb of the internal  capsule on the left. -MRA brain--no LVO -Carotid Duplex--neg -Echo--EF 60-65%, G2DD, no PFO -LDL--117 -HbA1C--6.3 -Antiplatelet--ASA 81 mg  Hyperlipidemia -she has statin intolerance with muscle cramps -try pravastatin  Paroxysmal atrial fibrillation -rate controlled -continue apixaban -continue metoprolol  Impaired glucose tolerance -HbA1C--6.3 -lifestyle modification  Essential Hypertension -allowing for permissive hypertension -holding losartan and chlorthalidone  Morbid Obesity -BMI 50.76 -lifestyle modification  OSA -continue CPAP        Disposition Plan:   Home 11/19 if stable Family Communication:   Spouse updated at bedside 11/18  Consultants:  neurology  Code Status:  FULL  DVT Prophylaxis: apixaban   Procedures: As Listed in Progress Note Above  Antibiotics: None      Subjective: Patient denies fevers, chills, headache, chest pain, dyspnea, nausea, vomiting, diarrhea, abdominal pain, dysuria, hematuria, hematochezia, and melena. Dysphasia is improved.  Vision near normal.  Objective: Vitals:   02/08/19 0004 02/08/19 0209 02/08/19 0400 02/08/19 1600  BP: (!) 146/79 133/72 (!) 143/76 (!) 169/89  Pulse: 67 63 61 68  Resp: 20 20 18 20   Temp: 98.7 F (37.1 C) 99 F (37.2 C) 98 F (36.7 C) 98.3 F (36.8 C)  TempSrc: Oral Oral Oral Oral  SpO2: 95% 97% 94% 98%  Weight:      Height:        Intake/Output Summary (Last 24 hours) at 02/08/2019 1632 Last data filed at 02/08/2019 0900 Gross per 24 hour  Intake 584 ml  Output --  Net 584 ml   Weight change:  Exam:   General:  Pt is alert, follows commands appropriately, not in acute distress  HEENT: No icterus, No thrush, No neck mass, [place]/AT  Cardiovascular: IRRR, S1/S2, no rubs, no gallops  Respiratory: CTA bilaterally, no wheezing, no crackles, no rhonchi  Abdomen: Soft/+BS, non tender, non distended, no guarding  Extremities: 1+LE edema, No lymphangitis, No petechiae,  No rashes, no synovitis   Data Reviewed: I have personally reviewed following labs and imaging studies Basic Metabolic Panel: Recent Labs  Lab 02/06/19 1649 02/06/19 1650 02/07/19 1056  NA 133* 139 136  K 3.1* 3.3* 3.8  CL 98 98 98  CO2 27  --  28  GLUCOSE 113* 109* 122*  BUN 15 15 12   CREATININE 0.68 0.80 0.67  CALCIUM 8.7*  --  9.1   Liver Function Tests: Recent Labs  Lab 02/06/19 1649  AST 21  ALT 17  ALKPHOS 83  BILITOT 0.4  PROT 7.0  ALBUMIN 3.8   No results for input(s): LIPASE, AMYLASE in the last 168 hours. No results for input(s): AMMONIA in the last 168 hours. Coagulation Profile: Recent Labs  Lab 02/06/19 1649  INR 1.2   CBC: Recent Labs  Lab 02/06/19 1649 02/06/19 1650 02/07/19 1056  WBC 11.1*  --  11.0*  NEUTROABS 6.7  --   --   HGB 14.7 15.6* 14.8  HCT 47.2* 46.0 48.4*  MCV 89.4  --  90.6  PLT 258  --  281   Cardiac Enzymes: No results for input(s): CKTOTAL, CKMB, CKMBINDEX, TROPONINI in the last 168 hours. BNP: Invalid input(s): POCBNP CBG: Recent Labs  Lab 02/06/19 1643 02/07/19 1633 02/08/19 0751  GLUCAP 98 112* 117*   HbA1C: Recent Labs    02/07/19 1056  HGBA1C 6.3*   Urine analysis:    Component Value Date/Time   COLORURINE YELLOW 01/17/2017 1723   APPEARANCEUR HAZY (A) 01/17/2017 1723   LABSPEC 1.017 01/17/2017 1723   PHURINE 6.0 01/17/2017 1723   GLUCOSEU NEGATIVE 01/17/2017 1723   HGBUR NEGATIVE 01/17/2017 1723   BILIRUBINUR NEGATIVE 01/17/2017 1723   BILIRUBINUR n 11/18/2015 1325   KETONESUR NEGATIVE 01/17/2017 1723   PROTEINUR NEGATIVE 01/17/2017 1723   UROBILINOGEN negative 11/18/2015 1325   UROBILINOGEN 0.2 09/02/2014 1847   NITRITE NEGATIVE 01/17/2017 1723   LEUKOCYTESUR NEGATIVE 01/17/2017 1723   Sepsis Labs: @LABRCNTIP (procalcitonin:4,lacticidven:4) ) Recent Results (from the past 240 hour(s))  SARS CORONAVIRUS 2 ([place] Sun[place] 6-24 HRS) Nasopharyngeal Nasopharyngeal Swab     Status: None   Collection  Time: 02/07/19  1:04 PM   Specimen: Nasopharyngeal Swab  Result Value Ref Range Status   SARS Coronavirus 2 NEGATIVE NEGATIVE Final    Comment: (NOTE) SARS-CoV-2 target nucleic acids are NOT DETECTED. The SARS-CoV-2 RNA is generally detectable in upper and lower respiratory specimens during the acute phase of infection. Negative results do not preclude SARS-CoV-2 infection, do not rule out co-infections with other pathogens, and should not be used as the sole basis for treatment or other patient management decisions. Negative results must be combined with clinical observations, patient history, and epidemiological information. The expected result is Negative. Fact Sheet for Patients: SugarRoll.be Fact Sheet for Healthcare Providers: https://www.woods-mathews.com/ This test is not yet approved or cleared by the [place] FDA and  has been authorized for detection and/or diagnosis of SARS-CoV-2 by FDA under an Emergency Use Authorization (EUA). This EUA will remain  in effect (meaning this test can be used) for the duration of the COVID-19 declaration under Section 56 4(b)(1) of the Act, 21 U.S.C. section  360bbb-3(b)(1), unless the authorization is terminated or revoked sooner. Performed at [facility], [place] [address] [place], [place] [address]      Scheduled Meds:   stroke: mapping our early stages of recovery book   Does not apply Once   apixaban  5 mg Oral BID   cholecalciferol  1,000 Units Oral q morning - 10a   influenza vaccine adjuvanted  0.5 mL Intramuscular Tomorrow-1000   insulin aspart  0-15 Units Subcutaneous TID WC   metoprolol tartrate  25 mg Oral BID   multivitamin with minerals  1 tablet Oral q morning - 10a   Continuous Infusions:  sodium chloride 60 mL/hr at 02/07/19 2116    Procedures/Studies: Ct Head Wo Contrast  Result Date: 02/07/2019 CLINICAL DATA:  Acute onset headache.  Recent dysarthria  EXAM: CT HEAD WITHOUT CONTRAST TECHNIQUE: Contiguous axial images were obtained from the base of the skull through the vertex without intravenous contrast. COMPARISON:  February 06, 2019 and January 14, 2019 FINDINGS: Brain: There is age related volume loss. There is no intracranial mass, hemorrhage, extra-axial fluid collection, or midline shift. There is evidence of a prior small infarct in the inferior left parietal lobe, stable. There is patchy small vessel disease in the centra semiovale bilaterally, stable. No acute infarct evident on this study. Vascular: No hyperdense vessel. There is slight vascular calcification in the carotid siphon regions bilaterally as well as in each distal vertebral artery. Skull: The bony calvarium appears intact. Sinuses/Orbits: Visualized paranasal sinuses are clear. Orbits appear symmetric bilaterally. Other: Mastoid air cells are clear. IMPRESSION: Age related volume loss with patchy periventricular small vessel disease, stable. Prior small left parietal lobe infarct. No acute infarct. No mass or hemorrhage. There are foci of arterial vascular calcification. Electronically Signed   By: Lowella Grip III M.D.   On: 02/07/2019 10:10   Ct Head Wo Contrast  Result Date: 01/14/2019 CLINICAL DATA:  Cerebral hemorrhage suspected EXAM: CT HEAD WITHOUT CONTRAST TECHNIQUE: Contiguous axial images were obtained from the base of the skull through the vertex without intravenous contrast. COMPARISON:  CT 02/12/2018 FINDINGS: Brain: Stable gliosis in the left parietal region compatible with remote infarct. Stable parafalcine lipoma and benign dural calcifications. No evidence of acute infarction, hemorrhage, hydrocephalus, extra-axial collection or mass lesion/mass effect. Symmetric prominence of the ventricles, cisterns and sulci compatible with parenchymal volume loss. Patchy areas of white matter hypoattenuation are most compatible with chronic microvascular angiopathy. Vascular:  Atherosclerotic calcification of the carotid siphons and intradural vertebral arteries. No hyperdense vessel. Skull: No scalp swelling or hematoma. No calvarial fracture or suspicious osseous lesions. Sinuses/Orbits: Paranasal sinuses and mastoid air cells are predominantly clear. Other: Orbital structures are unremarkable aside from prior lens extractions. IMPRESSION: No acute intracranial abnormality. Stable remote left parietal infarct. Mild parenchymal volume loss and chronic microvascular angiopathy changes. Electronically Signed   By: Lovena Le M.D.   On: 01/14/2019 21:09   Mr Angio Head Wo Contrast  Result Date: 02/07/2019 CLINICAL DATA:  Headache and dizziness over the last day. EXAM: MRI HEAD WITHOUT CONTRAST MRA HEAD WITHOUT CONTRAST TECHNIQUE: Multiplanar, multiecho pulse sequences of the brain and surrounding structures were obtained without intravenous contrast. Angiographic images of the head were obtained using MRA technique without contrast. COMPARISON:  Head CT same day.  MRI 01/18/2017 FINDINGS: MRI HEAD FINDINGS Brain: Diffusion imaging shows a punctate acute infarction in the medial right cerebellum. Small cluster of punctate infarctions are present in the left posterior limb internal capsule region.  No other acute finding. No focal abnormality affects the brainstem. Old small vessel cerebellar infarction on the right. Cerebral hemispheres show old bilateral parieto-occipital cortical and subcortical infarctions which were acute in October of 2018. There are mild chronic small-vessel ischemic changes elsewhere affecting the hemispheric white matter. No mass lesion, hemorrhage, hydrocephalus or extra-axial collection. Vascular: Major vessels at the base of the brain show flow. Skull and upper cervical spine: Negative Sinuses/Orbits: Clear/normal Other: None MRA HEAD FINDINGS Both internal carotid arteries are patent through the skull base and siphon regions. The left internal carotid  artery supplies the left middle cerebral artery territory and the predominant supply to both anterior cerebral artery territories. The A1 segment on the right is diminutive. Flow is present in both middle cerebral artery territories, but distal vessels show atherosclerotic narrowing and irregularity. Both vertebral arteries are widely patent to the basilar. No basilar stenosis. Posterior circulation branch vessels are patent. Left PCA is not as well shown on the reconstructed images but on the source images appears to be patent, with signal loss due to tortuosity. IMPRESSION: Subcentimeter acute infarction in the right medial cerebellar hemisphere. Small cluster of punctate acute infarctions in the posterior limb internal capsule on the left. Chronic small-vessel ischemic changes elsewhere affecting the brain. Old bilateral parieto-occipital cortical infarctions. No intracranial large or medium vessel occlusion. Distal vessel atherosclerotic irregularity consistent with small vessel change. Electronically Signed   By: Nelson Chimes M.D.   On: 02/07/2019 12:32   Mr Brain Wo Contrast (neuro Protocol)  Result Date: 02/07/2019 CLINICAL DATA:  Headache and dizziness over the last day. EXAM: MRI HEAD WITHOUT CONTRAST MRA HEAD WITHOUT CONTRAST TECHNIQUE: Multiplanar, multiecho pulse sequences of the brain and surrounding structures were obtained without intravenous contrast. Angiographic images of the head were obtained using MRA technique without contrast. COMPARISON:  Head CT same day.  MRI 01/18/2017 FINDINGS: MRI HEAD FINDINGS Brain: Diffusion imaging shows a punctate acute infarction in the medial right cerebellum. Small cluster of punctate infarctions are present in the left posterior limb internal capsule region. No other acute finding. No focal abnormality affects the brainstem. Old small vessel cerebellar infarction on the right. Cerebral hemispheres show old bilateral parieto-occipital cortical and  subcortical infarctions which were acute in October of 2018. There are mild chronic small-vessel ischemic changes elsewhere affecting the hemispheric white matter. No mass lesion, hemorrhage, hydrocephalus or extra-axial collection. Vascular: Major vessels at the base of the brain show flow. Skull and upper cervical spine: Negative Sinuses/Orbits: Clear/normal Other: None MRA HEAD FINDINGS Both internal carotid arteries are patent through the skull base and siphon regions. The left internal carotid artery supplies the left middle cerebral artery territory and the predominant supply to both anterior cerebral artery territories. The A1 segment on the right is diminutive. Flow is present in both middle cerebral artery territories, but distal vessels show atherosclerotic narrowing and irregularity. Both vertebral arteries are widely patent to the basilar. No basilar stenosis. Posterior circulation branch vessels are patent. Left PCA is not as well shown on the reconstructed images but on the source images appears to be patent, with signal loss due to tortuosity. IMPRESSION: Subcentimeter acute infarction in the right medial cerebellar hemisphere. Small cluster of punctate acute infarctions in the posterior limb internal capsule on the left. Chronic small-vessel ischemic changes elsewhere affecting the brain. Old bilateral parieto-occipital cortical infarctions. No intracranial large or medium vessel occlusion. Distal vessel atherosclerotic irregularity consistent with small vessel change. Electronically Signed   By: Nelson Chimes  M.D.   On: 02/07/2019 12:32   US Carotid Bilateral (at [facility] And [facility] Only)  Result Date: 02/08/2019 CLINICAL DATA:  Vision loss. Blurriness. History of stroke/TIA hypertension. EXAM: BILATERAL CAROTID DUPLEX ULTRASOUND TECHNIQUE: Pearline Cables scale imaging, color Doppler and duplex ultrasound were performed of bilateral carotid and vertebral arteries in the neck. COMPARISON:  None. FINDINGS:  Criteria: Quantification of carotid stenosis is based on velocity parameters that correlate the residual internal carotid diameter with NASCET-based stenosis levels, using the diameter of the distal internal carotid lumen as the denominator for stenosis measurement. The following velocity measurements were obtained: RIGHT ICA: 98/23 cm/sec CCA: 0000000 cm/sec SYSTOLIC ICA/CCA RATIO:  1.0 ECA: 88 cm/sec LEFT ICA: 91/9 cm/sec CCA: [id number] cm/sec SYSTOLIC ICA/CCA RATIO:  0.9 ECA: 88 cm/sec RIGHT CAROTID ARTERY: There is no grayscale evidence of significant intimal thickening or atherosclerotic plaque affecting the interrogated portions of the right carotid system. There are no elevated peak systolic velocities within the interrogated course of the right internal carotid artery to suggest a hemodynamically significant stenosis. RIGHT VERTEBRAL ARTERY:  Antegrade Flow LEFT CAROTID ARTERY: There is no grayscale evidence of significant intimal thickening or atherosclerotic plaque affecting the interrogated portions of the left carotid system. There are no elevated peak systolic velocities within the interrogated course of the left internal carotid artery to suggest a hemodynamically significant stenosis. LEFT VERTEBRAL ARTERY:  Antegrade Flow IMPRESSION: Unremarkable carotid Doppler ultrasound. Electronically Signed   By: Sandi Mariscal M.D.   On: 02/08/2019 11:17   Ct Head Code Stroke Wo Contrast  Result Date: 02/06/2019 CLINICAL DATA:  Code stroke. Unexplained altered level of consciousness. Speech disturbance. Code stroke. Difficulty beginning 1530 hours EXAM: CT HEAD WITHOUT CONTRAST TECHNIQUE: Contiguous axial images were obtained from the base of the skull through the vertex without intravenous contrast. COMPARISON:  01/14/2019 FINDINGS: Brain: Mild age related volume loss. Mild chronic small-vessel change of the hemispheric white matter. No sign of acute infarction, mass lesion, hemorrhage, hydrocephalus or  extra-axial collection. Old small left inferior parietal cortical infarction. Vascular: There is atherosclerotic calcification of the major vessels at the base of the brain. Skull: Negative Sinuses/Orbits: Clear/normal Other: None ASPECTS ([place] Stroke Program Early CT Score) - Ganglionic level infarction (caudate, lentiform nuclei, internal capsule, insula, M1-M3 cortex): 7 - Supraganglionic infarction (M4-M6 cortex): 3 Total score (0-10 with 10 being normal): 10 IMPRESSION: 1. No acute finding by CT. Age related volume loss and chronic small-vessel change of the white matter. Old small left inferior parietal infarction. 2. ASPECTS is 10. 3. These results were called by telephone at the time of interpretation on 02/06/2019 at 4:45 pm to provider JOSEPH ZAMMIT , who verbally acknowledged these results. Electronically Signed   By: Nelson Chimes M.D.   On: 02/06/2019 16:46    Orson Eva, DO  [facility] Pager [phone]  If 7PM-7AM, please contact night-coverage [email or web address] Password [email or web address] 02/08/2019, 4:32 PM   LOS: 1 day

## 2019-02-08 NOTE — Progress Notes (Signed)
Xcover Pt apparently left AMA

## 2019-02-08 NOTE — Evaluation (Signed)
Speech Language Pathology Evaluation Patient Details Name: Nicole Cabrera MRN: [id number] DOB: 1945/11/22 Today's Date: 02/08/2019 Time: 1230-1300 SLP Time Calculation (min) (ACUTE ONLY): 30 min  Problem List:  Patient Active Problem List   Diagnosis Date Noted  . Acute CVA (cerebrovascular accident) ([place]) 02/07/2019  . Coagulopathy ([place]) 02/07/2019  . Anticoagulated 02/07/2019  . Visual changes 02/07/2019  . Leukocytosis 02/07/2019  . Hyperglycemia 02/07/2019  . Diabetes mellitus without complication ([place])   . Hypokalemia 03/19/2017  . HLD (hyperlipidemia) 03/19/2017  . Hematochezia   . Acute blood loss anemia   . Rectal bleeding 03/18/2017  . Acute GI bleeding   . 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 Stroke (cerebrum) ([place]) 01/18/2017  . Intracerebral hemorrhage 01/18/2017  . Elevated troponin 01/17/2017  . Atrial fibrillation ([place]) 10/27/2016  . Obesity 06/20/2015  . Nephrolithiasis 07/09/2014  . Asthma with bronchitis 02/23/2014  . Aphasia 02/27/2013  . ACUTE BRONCHITIS 08/30/2009  . Essential hypertension 02/22/2007  . Obstructive sleep apnea 02/22/2007  . ALLERGY 02/22/2007   Past Medical History:  Past Medical History:  Diagnosis Date  . Allergy   . Anxiety    xanax  . Arthritis    shoulders/hips - tx with OTC meds  . Breast cyst 03/1990   pt denies 12/05/13  . Bronchitis   . Cataract   . Cervical tab 1972  . 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 Diabetes mellitus without complication ([place])    stated not diabetic but her AIC has been high so she is monitored closely   . Endometrial polyp 04/2000  . Fall 01/2018   nose fracture   . Heart murmur   . Hemorrhoids   . Hypertension   . Kidney stones   . Obesity   . Osteopenia 05/2007  . Paroxysmal atrial fibrillation (H[facility])   . Peripheral neuropathy   . PMB (postmenopausal bleeding) 04/2000  . SAB (spontaneous abortion) 1981  . Sleep apnea    CPAP  . Stroke ([place])   . SVD  (spontaneous vaginal delivery) 04/1964, 05/1978   x3  . TIA (transient ischemic attack)    Past Surgical History:  Past Surgical History:  Procedure Laterality Date  . CATARACT EXTRACTION Right 12/07/2018  . CERVICAL DISC SURGERY  2/93  . CESAREAN SECTION  6/83   with BTL  . COLONOSCOPY N/A 03/19/2017   Procedure: COLONOSCOPY;  Surgeon: Doran Stabler, MD;  Location: [facility];  Service: Gastroenterology;  Laterality: N/A;  . HYSTEROSCOPY  5/02, 01/2011  . LITHOTRIPSY  08/30/2014   with Dr. Jeffie Pollock at [facility]  . TOTAL KNEE ARTHROPLASTY Right 6/08   right  . TOTAL KNEE ARTHROPLASTY Left 4/10   left   . WRIST SURGERY Left 4/07   left   HPI:  Nicole Cabrera is a 73 y.o. female with prior cerebrovascular disease, atrial fibrillation fully anticoagulated with apixaban, diabetes mellitus and hypertension and other comorbidities detailed below presented to the ED yesterday 02/06/2019 with vision changes that she described as water behind the eyes and a wavy vision associated with difficulty recalling words and some speech difficulties.  She received a CT of the head at that time that did not show any acute findings. Apparently after the patient got home she continued to have difficulty recalling words and speech difficulties and vision changes.  She also developed a left anterior forehead headache earlier this morning.  She returned to the emergency department a repeat CT scan was done with no acute findings.  She agreed at that time to have an MRI of the brain.  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  The patient is being admitted for full stroke work-up.  The patient reports that her symptoms have mostly resolved completely at this time.   Assessment / Plan / Recommendation Clinical Impression  SLE completed in room with family present. Pt presents with mild  attention and working memory deficits which is baseline from stroke in 2018 per Pt and family. These deficits impact complex word retrieval in conversation per Pt (not evident today). She uses written cues at home to assist with memory deficits as needed. No further SLP services indicated at this time.     SLP Assessment  SLP Recommendation/Assessment: Patient does not need any further Speech Lanaguage Pathology Services SLP Visit Diagnosis: Cognitive communication deficit (R41.841)    Follow Up Recommendations  None    Frequency and Duration           SLP Evaluation Cognition  Overall Cognitive Status: History of cognitive impairments - at baseline Arousal/Alertness: Awake/alert Orientation Level: Oriented X4 Attention: Sustained Sustained Attention: Impaired Sustained Attention Impairment: Verbal complex Memory: Impaired Memory Impairment: Retrieval deficit(2/4 with 5 minute delay, 4/4 with cues) Awareness: Appears intact Problem Solving: Appears intact Safety/Judgment: Appears intact       Comprehension  Auditory Comprehension Overall Auditory Comprehension: Appears within functional limits for tasks assessed Yes/No Questions: Within Functional Limits Commands: Within Functional Limits Conversation: Complex Interfering Components: Attention Visual Recognition/Discrimination Discrimination: Within Function Limits Reading Comprehension Reading Status: Within funtional limits    Expression Expression Primary Mode of Expression: Verbal Verbal Expression Overall Verbal Expression: Appears within functional limits for tasks assessed Initiation: No impairment Automatic Speech: Name;Social Response;Month of year Level of Generative/Spontaneous Verbalization: Conversation Repetition: No impairment Naming: No impairment Pragmatics: No impairment Interfering Components: Attention Non-Verbal Means of Communication: Not applicable Written Expression Dominant Hand: Right    Oral / Motor  Oral Motor/Sensory Function Overall Oral Motor/Sensory Function: Within functional limits Motor Speech Overall Motor Speech: Appears within functional limits for tasks assessed Respiration: Within functional limits Phonation: Normal Resonance: Within functional limits Articulation: Within functional limitis Intelligibility: Intelligible Motor Planning: Witnin functional limits Motor Speech Errors: Not applicable   Thank you,  Genene Churn, [place] 02/08/2019, 1:08 PM

## 2019-02-08 NOTE — Progress Notes (Signed)
*  PRELIMINARY RESULTS* Echocardiogram 2D Echocardiogram has been performed.  Leavy Cella 02/08/2019, 11:59 AM

## 2019-02-08 NOTE — Consult Note (Signed)
Villanueva A. Merlene Laughter, MD     [email or web address]          Nicole Cabrera is an 73 y.o. female.   ASSESSMENT/PLAN: The chart is reviewed and imaging but patient was not seen as she left before she could be seen in person. Appears she has had a couple of lacunar infarcts while on anticoagulation. Seems expedient to continue with the current anticoagulation but add aspirin for a week.      Blood pressure (!) 169/89, pulse 68, temperature 98.3 F (36.8 C), temperature source Oral, resp. rate 20, height 4\' 11"  (1.499 m), weight 114 kg, last menstrual period 01/22/2011, SpO2 98 %.  Past Medical History:  Diagnosis Date  . Allergy   . Anxiety    xanax  . Arthritis    shoulders/hips - tx with OTC meds  . Breast cyst 03/1990   pt denies 12/05/13  . Bronchitis   . Cataract   . Cervical tab 1972  . 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 Diabetes mellitus without complication ([place])    stated not diabetic but her AIC has been high so she is monitored closely   . Endometrial polyp 04/2000  . Fall 01/2018   nose fracture   . Heart murmur   . Hemorrhoids   . Hypertension   . Kidney stones   . Obesity   . Osteopenia 05/2007  . Paroxysmal atrial fibrillation ([facility])   . Peripheral neuropathy   . PMB (postmenopausal bleeding) 04/2000  . SAB (spontaneous abortion) 1981  . Sleep apnea    CPAP  . Stroke ([place])   . SVD (spontaneous vaginal delivery) 04/1964, 05/1978   x3  . TIA (transient ischemic attack)     Past Surgical History:  Procedure Laterality Date  . CATARACT EXTRACTION Right 12/07/2018  . CERVICAL DISC SURGERY  2/93  . CESAREAN SECTION  6/83   with BTL  . COLONOSCOPY N/A 03/19/2017   Procedure: COLONOSCOPY;  Surgeon: Doran Stabler, MD;  Location: [facility];  Service: Gastroenterology;  Laterality: N/A;  . HYSTEROSCOPY  5/02, 01/2011  . LITHOTRIPSY  08/30/2014   with [facility] Jeff[facility] Poll[facility] at [facility]  . TOTAL KNEE ARTHROPLASTY Right 6/08    right  . TOTAL KNEE ARTHROPLASTY Left 4/10   left   . WRIST SURGERY Left 4/07   left    Family History  Problem Relation Age of Onset  . Heart disease Father   . Colon cancer Mother   . Heart disease Mother   . Breast cancer Maternal Aunt   . Colon cancer Maternal Aunt   . Colon cancer Maternal Uncle   . Ehlers-Danlos syndrome Other        neice-being tested 4/15  . Stomach cancer Neg Hx   . Pancreatic cancer Neg Hx     Social History:  reports that she has never smoked. She has never used smokeless tobacco. She reports that she does not drink alcohol or use drugs.  Allergies:  Allergies  Allergen Reactions  . Bactrim [Sulfamethoxazole-Trimethoprim] Other (See Comments)    Unknown   . Ciprofloxacin Other (See Comments)    GI upset  . Hydroxyzine Hcl     Other reaction(s): Other (See Comments) Pt becomes physchotic.  Marland Kitchen Keflet [Cephalexin] Other (See Comments)    GI upset  . Lipitor [Atorvastatin]     Muscle cramps  . Lisinopril Cough  . Spironolactone Nausea Only  . Vistaril [Hydroxyzine Hcl] Other (See Comments)    Pt  becomes physchotic.  Marland Kitchen Erythromycin Rash  . Latex Rash  . Penicillins Rash    Has patient had a PCN reaction causing immediate rash, facial/tongue/throat swelling, SOB or lightheadedness with hypotension: Yes Has patient had a PCN reaction causing severe rash involving mucus membranes or skin necrosis: No Has patient had a PCN reaction that required hospitalization No Has patient had a PCN reaction occurring within the last 10 years: No If all of the above answers are "NO", then may proceed with Cephalosporin use.   . Tape Itching, Rash and Other (See Comments)    Burning Burning  . Tetracycline Rash    Medications: Prior to Admission medications   Medication Sig Start Date End Date Taking? Authorizing Provider  acetaminophen (TYLENOL) 500 MG tablet Take 1,000 mg by mouth 2 (two) times daily as needed for headache.   Yes [provider]  apixaban (ELIQUIS) 5 MG TABS tablet Take 1 tablet (5 mg total) by mouth 2 (two) times daily. 09/26/18  Yes Branch, Alphonse Guild, MD  chlorthalidone (HYGROTON) 25 MG tablet Take 1 tablet (25 mg total) by mouth daily. 12/16/18 03/16/19 Yes BranchAlphonse Guild, MD  cholecalciferol (VITAMIN D) 1000 UNITS tablet Take 1,000 Units by mouth every morning.    Yes [provider]  losartan (COZAAR) 100 MG tablet Take 1 tablet (100 mg total) by mouth daily. 12/16/18 03/16/19 Yes Branch, Alphonse Guild, MD  metoprolol tartrate (LOPRESSOR) 25 MG tablet Take 1 tablet (25 mg total) by mouth 2 (two) times daily. May take an extra tablet as needed for palpitations 03/24/18  Yes Strader, [place], PA-C  Multiple Vitamins-Minerals (MULTIVITAMIN WITH MINERALS) tablet Take 1 tablet by mouth every morning. Centrum Silver Womens 50+   Yes [provider]  Polyethyl Glycol-Propyl Glycol (SYSTANE OP) Place 1 drop into both eyes daily as needed (dry eyes).    Yes [provider]  potassium chloride SA (K-DUR) 20 MEQ tablet TAKE ONE TABLET BY MOUTH DAILY. Patient taking differently: Take 20 mEq by mouth daily.  10/13/18  Yes Branch, Alphonse Guild, MD  PROAIR HFA 108 [phone] Base) MCG/ACT inhaler INHALE TWO PUFFS BY MOUTH EVERY 6 HOURS AS NEEDED FOR WHEEZING AND SHORTNESS OF BREATH Patient taking differently: Inhale 2 puffs into the lungs every 6 (six) hours as needed.  11/16/17  Yes Young, Tarri Fuller D, MD  sodium chloride (OCEAN) 0.65 % SOLN nasal spray Place 1 spray into both nostrils as needed for congestion.   Yes [provider]  ALPRAZolam (XANAX) 0.25 MG tablet Take 0.25 mg by mouth daily as needed for anxiety.  02/01/18   [provider]    Scheduled Meds: .  stroke: mapping our early stages of recovery book   Does not apply Once  . apixaban  5 mg Oral BID  . aspirin  81 mg Oral Daily  . cholecalciferol  1,000 Units Oral q morning - 10a  . influenza vaccine adjuvanted  0.5 mL  Intramuscular Tomorrow-1000  . insulin aspart  0-15 Units Subcutaneous TID WC  . metoprolol tartrate  25 mg Oral BID  . multivitamin with minerals  1 tablet Oral q morning - 10a  . pravastatin  40 mg Oral q1800   Continuous Infusions: . sodium chloride 60 mL/hr at 02/07/19 2116   PRN Meds:.acetaminophen **OR** acetaminophen (TYLENOL) oral liquid 160 mg/5 mL **OR** acetaminophen, albuterol, polyvinyl alcohol, senna-docusate, sodium chloride     Results for orders placed or performed during the hospital encounter of 02/07/19 (from the  past 48 hour(s))  CBC     Status: Abnormal   Collection Time: 02/07/19 10:56 AM  Result Value Ref Range   WBC 11.0 (H) 4.0 - 10.5 K/uL   RBC 5.34 (H) 3.87 - 5.11 MIL/uL   Hemoglobin 14.8 12.0 - 15.0 g/dL   HCT 48.4 (H) 36.0 - 46.0 %   MCV 90.6 80.0 - 100.0 fL   MCH 27.7 26.0 - 34.0 pg   MCHC 30.6 30.0 - 36.0 g/dL   RDW 14.3 11.5 - 15.5 %   Platelets 281 150 - 400 K/uL   nRBC 0.0 0.0 - 0.2 %    Comment: Performed at [facility], [address], [place], [place] [id number]  Basic metabolic panel     Status: Abnormal   Collection Time: 02/07/19 10:56 AM  Result Value Ref Range   Sodium 136 135 - 145 mmol/L   Potassium 3.8 3.5 - 5.1 mmol/L   Chloride 98 98 - 111 mmol/L   CO2 28 22 - 32 mmol/L   Glucose, Bld 122 (H) 70 - 99 mg/dL   BUN 12 8 - 23 mg/dL   Creatinine, Ser 0.67 0.44 - 1.00 mg/dL   Calcium 9.1 8.9 - 10.3 mg/dL   GFR calc non Af Amer >60 >60 mL/min   GFR calc Af Amer >60 >60 mL/min   Anion gap 10 5 - 15    Comment: Performed at [facility], [address] [place], [place] [address]  Lipid panel     Status: Abnormal   Collection Time: 02/07/19 10:56 AM  Result Value Ref Range   Cholesterol 192 0 - 200 mg/dL   Triglycerides 119 <150 mg/dL   HDL 51 >40 mg/dL   Total CHOL/HDL Ratio 3.8 RATIO   VLDL 24 0 - 40 mg/dL   LDL Cholesterol 117 (H) 0 - 99 mg/dL    Comment:        Total Cholesterol/HDL:CHD Risk Coronary Heart Disease Risk  Table                     Men   Women  1/2 Average Risk   3.4   3.3  Average Risk       5.0   4.4  2 X Average Risk   9.6   7.1  3 X Average Risk  23.4   11.0        Use the calculated Patient Ratio above and the CHD Risk Table to determine the patient's CHD Risk.        ATP III CLASSIFICATION (LDL):  <100     mg/dL   Optimal  100-129  mg/dL   Near or Above                    Optimal  130-159  mg/dL   Borderline  160-189  mg/dL   High  >190     mg/dL   Very High Performed at [address] [place], [place] [address]   Hemoglobin A1c     Status: Abnormal   Collection Time: 02/07/19 10:56 AM  Result Value Ref Range   Hgb A1c MFr Bld 6.3 (H) 4.8 - 5.6 %    Comment: (NOTE) Pre diabetes:          5.7%-6.4% Diabetes:              >6.4% Glycemic control for   <7.0% adults with diabetes    Mean Plasma Glucose 134.11 mg/dL  Comment: Performed at [facility], [place] [address] [place], [place] [address]  SARS CORONAVIRUS 2 (TAT 6-24 HRS) Nasopharyngeal Nasopharyngeal Swab     Status: None   Collection Time: 02/07/19  1:04 PM   Specimen: Nasopharyngeal Swab  Result Value Ref Range   SARS Coronavirus 2 NEGATIVE NEGATIVE    Comment: (NOTE) SARS-CoV-2 target nucleic acids are NOT DETECTED. The SARS-CoV-2 RNA is generally detectable in upper and lower respiratory specimens during the acute phase of infection. Negative results do not preclude SARS-CoV-2 infection, do not rule out co-infections with other pathogens, and should not be used as the sole basis for treatment or other patient management decisions. Negative results must be combined with clinical observations, patient history, and epidemiological information. The expected result is Negative. Fact Sheet for Patients: SugarRoll.be Fact Sheet for Healthcare Providers: https://www.woods-mathews.com/ This test is not yet approved or cleared by the [place] FDA and   has been authorized for detection and/or diagnosis of SARS-CoV-2 by FDA under an Emergency Use Authorization (EUA). This EUA will remain  in effect (meaning this test can be used) for the duration of the COVID-19 declaration under Section 56 4(b)(1) of the Act, 21 U.S.C. section 360bbb-3(b)(1), unless the authorization is terminated or revoked sooner. Performed at [facility], [place] [address], [place],  [address]   CBG monitoring, ED     Status: Abnormal   Collection Time: 02/07/19  4:33 PM  Result Value Ref Range   Glucose-Capillary 112 (H) 70 - 99 mg/dL  Glucose, capillary     Status: Abnormal   Collection Time: 02/08/19  7:51 AM  Result Value Ref Range   Glucose-Capillary 117 (H) 70 - 99 mg/dL    Studies/Results:   TTE  1. Left ventricular ejection fraction, by visual estimation, is 60 to 65%. The left ventricle has normal function. There is moderately increased left ventricular hypertrophy.  2. Elevated left atrial pressure.  3. Left ventricular diastolic parameters are consistent with Grade II diastolic dysfunction (pseudonormalization).  4. Global right ventricle has normal systolic function.The right ventricular size is normal. No increase in right ventricular wall thickness.  5. Left atrial size was severely dilated.  6. Right atrial size was mildly dilated.  7. Moderate aortic valve annular calcification.  8. Moderate mitral annular calcification.  9. The mitral valve is abnormal. No evidence of mitral valve regurgitation. Mild mitral stenosis. 10. The tricuspid valve is normal in structure. Tricuspid valve regurgitation is not demonstrated. 11. The aortic valve has an indeterminant number of cusps. Aortic valve regurgitation is not visualized. No evidence of aortic valve sclerosis or stenosis. 12. There is Moderate calcification of the aortic valve. 13. There is Moderate thickening of the aortic valve. 14. The pulmonic valve was not well visualized.  Pulmonic valve regurgitation is mild. 15. Moderately elevated pulmonary artery systolic pressure. 16. The inferior vena cava is normal in size with greater than 50% respiratory variability, suggesting right atrial pressure of 3 mmHg.     CAROTID IMPRESSION: Unremarkable carotid Doppler ultrasound.    BRAIN MRI FINDINGS: MRI HEAD FINDINGS  Brain: Diffusion imaging shows a punctate acute infarction in the medial right cerebellum. Small cluster of punctate infarctions are present in the left posterior limb internal capsule region. No other acute finding. No focal abnormality affects the brainstem. Old small vessel cerebellar infarction on the right. Cerebral hemispheres show old bilateral parieto-occipital cortical and subcortical infarctions which were acute in October of 2018. There are mild chronic small-vessel  ischemic changes elsewhere affecting the hemispheric white matter. No mass lesion, hemorrhage, hydrocephalus or extra-axial collection.  Vascular: Major vessels at the base of the brain show flow.  Skull and upper cervical spine: Negative  Sinuses/Orbits: Clear/normal  Other: None  MRA HEAD FINDINGS  Both internal carotid arteries are patent through the skull base and siphon regions. The left internal carotid artery supplies the left middle cerebral artery territory and the predominant supply to both anterior cerebral artery territories. The A1 segment on the right is diminutive. Flow is present in both middle cerebral artery territories, but distal vessels show atherosclerotic narrowing and irregularity.  Both vertebral arteries are widely patent to the basilar. No basilar stenosis. Posterior circulation branch vessels are patent. Left PCA is not as well shown on the reconstructed images but on the source images appears to be patent, with signal loss due to tortuosity.  IMPRESSION: Subcentimeter acute infarction in the right medial cerebellar  hemisphere.  Small cluster of punctate acute infarctions in the posterior limb internal capsule on the left.  Chronic small-vessel ischemic changes elsewhere affecting the brain. Old bilateral parieto-occipital cortical infarctions.  No intracranial large or medium vessel occlusion. Distal vessel atherosclerotic irregularity consistent with small vessel change.       The brain MRI is reviewed in person. There is a small increased signal seen on DWI involving the posterior limb of the internal capsule/ basal ganglia on the left side. There is also a questionable tiny increased signal involving the  Right cerebellum. There is is evidence of wedge shape increased signal noted on FLAIR imaging involving the left parietal occipital lobe. This is a small area indicative of the remote infarct. 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 No hemorrhages appreciated.  [place] A. Merlene Laughter, M.D.  Diplomate, Tax adviser of Psychiatry and Neurology ( Neurology). 02/08/2019, 7:42 PM

## 2019-02-10 NOTE — Discharge Summary (Signed)
Physician Discharge Summary  Nicole Cabrera [id number] DOB: 03-May-1945 DOA: 02/07/2019  PCP: Sandi Mealy, MD  Admit date: 02/07/2019 Discharge date: 02/08/19 Admitted From:HOME Disposition:  Against Medical Advice 1.    Discharge Condition: Against Medical Advice    Brief/Interim Summary: 73 y.o.femalewith prior cerebrovascular disease, atrial fibrillation fully anticoagulated with apixaban, diabetes mellitus and hypertension and other comorbidities detailed below presented to the ED 02/06/2019 with vision changes that she described as water behind the eyes and a wavy vision associated with difficulty recalling words and some speech difficulties. She received a CT of the head at that time that did not show any acute findings. A code stroke was called and the patient was seen by teleneurologist who at that time decided that patient was not a candidate for TPA because she was on Eliquis and because of resolution of the symptoms. They recommended at that time that the patient be admitted for TIA stroke work-up however the patient declined and wanted to go home. The patient did not want to have an MRI due to a prior bad experience. Her family took her home.  Apparently after the patient got home she continued to have difficulty recalling words and speech difficulties and vision changes. She also developed a left anterior forehead headache earlier this morning. She returned to the emergency department 02/07/19.  A repeat CT scan was done with no acute findings. She agreed at that time to have an MRI of the brain. The MRI did show findings of acute infarct in the right medial cerebellum and acute infarctions in the posterior limb of the internal capsule on the left. The patient is being admitted for full stroke work-up. The patient reports that her symptoms have mostly resolved completely at this time. Unfortunately, the patient left AMA before being seen by  neurology.  Discharge Diagnoses:  Acute Ischemic Stroke -Neurology Consult--pt left AMA before being seen by neurology -PT/OT evaluation--no follow up -Speech therapy eval -CT brain--neg -MRI brain--acute infarct in the right medial cerebellum and acute infarctions in the posterior limb of the internal capsule on the left. -MRA brain--no LVO -Carotid Duplex--neg -Echo--EF 60-65%, G2DD, no PFO -LDL--117 -HbA1C--6.3 -Antiplatelet--ASA 81 mg  Hyperlipidemia -she has statin intolerance with muscle cramps -try pravastatin  Paroxysmal atrial fibrillation -rate controlled -continue apixaban -continue metoprolol  Impaired glucose tolerance -HbA1C--6.3 -lifestyle modification  Essential Hypertension -allowing for permissive hypertension -holding losartan and chlorthalidone  Morbid Obesity -BMI 50.76 -lifestyle modification  OSA -continue CPAP      Discharge Instructions   Allergies as of 02/08/2019      Reactions   Bactrim [sulfamethoxazole-trimethoprim] Other (See Comments)   Unknown    Ciprofloxacin Other (See Comments)   GI upset   Hydroxyzine Hcl    Other reaction(s): Other (See Comments) Pt becomes physchotic.   Keflet [cephalexin] Other (See Comments)   GI upset   Lipitor [atorvastatin]    Muscle cramps   Lisinopril Cough   Spironolactone Nausea Only   Vistaril [hydroxyzine Hcl] Other (See Comments)   Pt becomes physchotic.   Erythromycin Rash   Latex Rash   Penicillins Rash   Has patient had a PCN reaction causing immediate rash, facial/tongue/throat swelling, SOB or lightheadedness with hypotension: Yes Has patient had a PCN reaction causing severe rash involving mucus membranes or skin necrosis: No Has patient had a PCN reaction that required hospitalization No Has patient had a PCN reaction occurring within the last 10 years: No If all of the above answers are "NO",  then may proceed with Cephalosporin use.   Tape Itching, Rash, Other  (See Comments)   Burning Burning   Tetracycline Rash      Medication List    ASK your doctor about these medications   acetaminophen 500 MG tablet Commonly known as: TYLENOL Take 1,000 mg by mouth 2 (two) times daily as needed for headache.   ALPRAZolam 0.25 MG tablet Commonly known as: XANAX Take 0.25 mg by mouth daily as needed for anxiety.   apixaban 5 MG Tabs tablet Commonly known as: Eliquis Take 1 tablet (5 mg total) by mouth 2 (two) times daily.   chlorthalidone 25 MG tablet Commonly known as: HYGROTON Take 1 tablet (25 mg total) by mouth daily.   cholecalciferol 1000 units tablet Commonly known as: VITAMIN D Take 1,000 Units by mouth every morning.   losartan 100 MG tablet Commonly known as: COZAAR Take 1 tablet (100 mg total) by mouth daily.   metoprolol tartrate 25 MG tablet Commonly known as: LOPRESSOR Take 1 tablet (25 mg total) by mouth 2 (two) times daily. May take an extra tablet as needed for palpitations   multivitamin with minerals tablet Take 1 tablet by mouth every morning. Centrum Silver Womens 50+   potassium chloride SA 20 MEQ tablet Commonly known as: KLOR-CON TAKE ONE TABLET BY MOUTH DAILY.   ProAir HFA 108 (90 Base) MCG/ACT inhaler Generic drug: albuterol INHALE TWO PUFFS BY MOUTH EVERY 6 HOURS AS NEEDED FOR WHEEZING AND SHORTNESS OF BREATH   sodium chloride 0.65 % Soln nasal spray Commonly known as: OCEAN Place 1 spray into both nostrils as needed for congestion.   SYSTANE OP Place 1 drop into both eyes daily as needed (dry eyes).       Allergies  Allergen Reactions   Bactrim [Sulfamethoxazole-Trimethoprim] Other (See Comments)    Unknown    Ciprofloxacin Other (See Comments)    GI upset   Hydroxyzine Hcl     Other reaction(s): Other (See Comments) Pt becomes physchotic.   Keflet [Cephalexin] Other (See Comments)    GI upset   Lipitor [Atorvastatin]     Muscle cramps   Lisinopril Cough   Spironolactone Nausea  Only   Vistaril [Hydroxyzine Hcl] Other (See Comments)    Pt becomes physchotic.   Erythromycin Rash   Latex Rash   Penicillins Rash    Has patient had a PCN reaction causing immediate rash, facial/tongue/throat swelling, SOB or lightheadedness with hypotension: Yes Has patient had a PCN reaction causing severe rash involving mucus membranes or skin necrosis: No Has patient had a PCN reaction that required hospitalization No Has patient had a PCN reaction occurring within the last 10 years: No If all of the above answers are "NO", then may proceed with Cephalosporin use.    Tape Itching, Rash and Other (See Comments)    Burning Burning   Tetracycline Rash    Consultations:  Neurology requested   Procedures/Studies: Ct Head Wo Contrast  Result Date: 02/07/2019 CLINICAL DATA:  Acute onset headache.  Recent dysarthria EXAM: CT HEAD WITHOUT CONTRAST TECHNIQUE: Contiguous axial images were obtained from the base of the skull through the vertex without intravenous contrast. COMPARISON:  February 06, 2019 and January 14, 2019 FINDINGS: Brain: There is age related volume loss. There is no intracranial mass, hemorrhage, extra-axial fluid collection, or midline shift. There is evidence of a prior small infarct in the inferior left parietal lobe, stable. There is patchy small vessel disease in the centra semiovale bilaterally, stable. No  acute infarct evident on this study. Vascular: No hyperdense vessel. There is slight vascular calcification in the carotid siphon regions bilaterally as well as in each distal vertebral artery. Skull: The bony calvarium appears intact. Sinuses/Orbits: Visualized paranasal sinuses are clear. Orbits appear symmetric bilaterally. Other: Mastoid air cells are clear. IMPRESSION: Age related volume loss with patchy periventricular small vessel disease, stable. Prior small left parietal lobe infarct. No acute infarct. No mass or hemorrhage. There are foci of arterial  vascular calcification. Electronically Signed   By: Lowella Grip III M.D.   On: 02/07/2019 10:10   Ct Head Wo Contrast  Result Date: 01/14/2019 CLINICAL DATA:  Cerebral hemorrhage suspected EXAM: CT HEAD WITHOUT CONTRAST TECHNIQUE: Contiguous axial images were obtained from the base of the skull through the vertex without intravenous contrast. COMPARISON:  CT 02/12/2018 FINDINGS: Brain: Stable gliosis in the left parietal region compatible with remote infarct. Stable parafalcine lipoma and benign dural calcifications. No evidence of acute infarction, hemorrhage, hydrocephalus, extra-axial collection or mass lesion/mass effect. Symmetric prominence of the ventricles, cisterns and sulci compatible with parenchymal volume loss. Patchy areas of white matter hypoattenuation are most compatible with chronic microvascular angiopathy. Vascular: Atherosclerotic calcification of the carotid siphons and intradural vertebral arteries. No hyperdense vessel. Skull: No scalp swelling or hematoma. No calvarial fracture or suspicious osseous lesions. Sinuses/Orbits: Paranasal sinuses and mastoid air cells are predominantly clear. Other: Orbital structures are unremarkable aside from prior lens extractions. IMPRESSION: No acute intracranial abnormality. Stable remote left parietal infarct. Mild parenchymal volume loss and chronic microvascular angiopathy changes. Electronically Signed   By: Lovena Le M.D.   On: 01/14/2019 21:09   Mr Angio Head Wo Contrast  Result Date: 02/07/2019 CLINICAL DATA:  Headache and dizziness over the last day. EXAM: MRI HEAD WITHOUT CONTRAST MRA HEAD WITHOUT CONTRAST TECHNIQUE: Multiplanar, multiecho pulse sequences of the brain and surrounding structures were obtained without intravenous contrast. Angiographic images of the head were obtained using MRA technique without contrast. COMPARISON:  Head CT same day.  MRI 01/18/2017 FINDINGS: MRI HEAD FINDINGS Brain: Diffusion imaging shows a  punctate acute infarction in the medial right cerebellum. Small cluster of punctate infarctions are present in the left posterior limb internal capsule region. No other acute finding. No focal abnormality affects the brainstem. Old small vessel cerebellar infarction on the right. Cerebral hemispheres show old bilateral parieto-occipital cortical and subcortical infarctions which were acute in October of 2018. There are mild chronic small-vessel ischemic changes elsewhere affecting the hemispheric white matter. No mass lesion, hemorrhage, hydrocephalus or extra-axial collection. Vascular: Major vessels at the base of the brain show flow. Skull and upper cervical spine: Negative Sinuses/Orbits: Clear/normal Other: None MRA HEAD FINDINGS Both internal carotid arteries are patent through the skull base and siphon regions. The left internal carotid artery supplies the left middle cerebral artery territory and the predominant supply to both anterior cerebral artery territories. The A1 segment on the right is diminutive. Flow is present in both middle cerebral artery territories, but distal vessels show atherosclerotic narrowing and irregularity. Both vertebral arteries are widely patent to the basilar. No basilar stenosis. Posterior circulation branch vessels are patent. Left PCA is not as well shown on the reconstructed images but on the source images appears to be patent, with signal loss due to tortuosity. IMPRESSION: Subcentimeter acute infarction in the right medial cerebellar hemisphere. Small cluster of punctate acute infarctions in the posterior limb internal capsule on the left. Chronic small-vessel ischemic changes elsewhere affecting the brain. Old bilateral  parieto-occipital cortical infarctions. No intracranial large or medium vessel occlusion. Distal vessel atherosclerotic irregularity consistent with small vessel change. Electronically Signed   By: Nelson Chimes M.D.   On: 02/07/2019 12:32   Mr Brain Wo  Contrast (neuro Protocol)  Result Date: 02/07/2019 CLINICAL DATA:  Headache and dizziness over the last day. EXAM: MRI HEAD WITHOUT CONTRAST MRA HEAD WITHOUT CONTRAST TECHNIQUE: Multiplanar, multiecho pulse sequences of the brain and surrounding structures were obtained without intravenous contrast. Angiographic images of the head were obtained using MRA technique without contrast. COMPARISON:  Head CT same day.  MRI 01/18/2017 FINDINGS: MRI HEAD FINDINGS Brain: Diffusion imaging shows a punctate acute infarction in the medial right cerebellum. Small cluster of punctate infarctions are present in the left posterior limb internal capsule region. No other acute finding. No focal abnormality affects the brainstem. Old small vessel cerebellar infarction on the right. Cerebral hemispheres show old bilateral parieto-occipital cortical and subcortical infarctions which were acute in October of 2018. There are mild chronic small-vessel ischemic changes elsewhere affecting the hemispheric white matter. No mass lesion, hemorrhage, hydrocephalus or extra-axial collection. Vascular: Major vessels at the base of the brain show flow. Skull and upper cervical spine: Negative Sinuses/Orbits: Clear/normal Other: None MRA HEAD FINDINGS Both internal carotid arteries are patent through the skull base and siphon regions. The left internal carotid artery supplies the left middle cerebral artery territory and the predominant supply to both anterior cerebral artery territories. The A1 segment on the right is diminutive. Flow is present in both middle cerebral artery territories, but distal vessels show atherosclerotic narrowing and irregularity. Both vertebral arteries are widely patent to the basilar. No basilar stenosis. Posterior circulation branch vessels are patent. Left PCA is not as well shown on the reconstructed images but on the source images appears to be patent, with signal loss due to tortuosity. IMPRESSION: Subcentimeter  acute infarction in the right medial cerebellar hemisphere. Small cluster of punctate acute infarctions in the posterior limb internal capsule on the left. Chronic small-vessel ischemic changes elsewhere affecting the brain. Old bilateral parieto-occipital cortical infarctions. No intracranial large or medium vessel occlusion. Distal vessel atherosclerotic irregularity consistent with small vessel change. Electronically Signed   By: Nelson Chimes M.D.   On: 02/07/2019 12:32   US Carotid Bilateral (at [facility] And [facility] Only)  Result Date: 02/08/2019 CLINICAL DATA:  Vision loss. Blurriness. History of stroke/TIA hypertension. EXAM: BILATERAL CAROTID DUPLEX ULTRASOUND TECHNIQUE: Pearline Cables scale imaging, color Doppler and duplex ultrasound were performed of bilateral carotid and vertebral arteries in the neck. COMPARISON:  None. FINDINGS: Criteria: Quantification of carotid stenosis is based on velocity parameters that correlate the residual internal carotid diameter with NASCET-based stenosis levels, using the diameter of the distal internal carotid lumen as the denominator for stenosis measurement. The following velocity measurements were obtained: RIGHT ICA: 98/23 cm/sec CCA: 0000000 cm/sec SYSTOLIC ICA/CCA RATIO:  1.0 ECA: 88 cm/sec LEFT ICA: 91/9 cm/sec CCA: [id number] cm/sec SYSTOLIC ICA/CCA RATIO:  0.9 ECA: 88 cm/sec RIGHT CAROTID ARTERY: There is no grayscale evidence of significant intimal thickening or atherosclerotic plaque affecting the interrogated portions of the right carotid system. There are no elevated peak systolic velocities within the interrogated course of the right internal carotid artery to suggest a hemodynamically significant stenosis. RIGHT VERTEBRAL ARTERY:  Antegrade Flow LEFT CAROTID ARTERY: There is no grayscale evidence of significant intimal thickening or atherosclerotic plaque affecting the interrogated portions of the left carotid system. There are no elevated peak systolic velocities within the  interrogated  course of the left internal carotid artery to suggest a hemodynamically significant stenosis. LEFT VERTEBRAL ARTERY:  Antegrade Flow IMPRESSION: Unremarkable carotid Doppler ultrasound. Electronically Signed   By: Sandi Mariscal M.D.   On: 02/08/2019 11:17   Ct Head Code Stroke Wo Contrast  Result Date: 02/06/2019 CLINICAL DATA:  Code stroke. Unexplained altered level of consciousness. Speech disturbance. Code stroke. Difficulty beginning 1530 hours EXAM: CT HEAD WITHOUT CONTRAST TECHNIQUE: Contiguous axial images were obtained from the base of the skull through the vertex without intravenous contrast. COMPARISON:  01/14/2019 FINDINGS: Brain: Mild age related volume loss. Mild chronic small-vessel change of the hemispheric white matter. No sign of acute infarction, mass lesion, hemorrhage, hydrocephalus or extra-axial collection. Old small left inferior parietal cortical infarction. Vascular: There is atherosclerotic calcification of the major vessels at the base of the brain. Skull: Negative Sinuses/Orbits: Clear/normal Other: None ASPECTS ([place] Stroke Program Early CT Score) - Ganglionic level infarction (caudate, lentiform nuclei, internal capsule, insula, M1-M3 cortex): 7 - Supraganglionic infarction (M4-M6 cortex): 3 Total score (0-10 with 10 being normal): 10 IMPRESSION: 1. No acute finding by CT. Age related volume loss and chronic small-vessel change of the white matter. Old small left inferior parietal infarction. 2. ASPECTS is 10. 3. These results were called by telephone at the time of interpretation on 02/06/2019 at 4:45 pm to provider JOSEPH ZAMMIT , who verbally acknowledged these results. Electronically Signed   By: Nelson Chimes M.D.   On: 02/06/2019 16:46         Discharge Exam: Vitals:   02/08/19 0400 02/08/19 1600  BP: (!) 143/76 (!) 169/89  Pulse: 61 68  Resp: 18 20  Temp: 98 F (36.7 C) 98.3 F (36.8 C)  SpO2: 94% 98%   Vitals:   02/08/19 0004 02/08/19 0209  02/08/19 0400 02/08/19 1600  BP: (!) 146/79 133/72 (!) 143/76 (!) 169/89  Pulse: 67 63 61 68  Resp: 20 20 18 20   Temp: 98.7 F (37.1 C) 99 F (37.2 C) 98 F (36.7 C) 98.3 F (36.8 C)  TempSrc: Oral Oral Oral Oral  SpO2: 95% 97% 94% 98%  Weight:      Height:           The results of significant diagnostics from this hospitalization (including imaging, microbiology, ancillary and laboratory) are listed below for reference.    Significant Diagnostic Studies: Ct Head Wo Contrast  Result Date: 02/07/2019 CLINICAL DATA:  Acute onset headache.  Recent dysarthria EXAM: CT HEAD WITHOUT CONTRAST TECHNIQUE: Contiguous axial images were obtained from the base of the skull through the vertex without intravenous contrast. COMPARISON:  February 06, 2019 and January 14, 2019 FINDINGS: Brain: There is age related volume loss. There is no intracranial mass, hemorrhage, extra-axial fluid collection, or midline shift. There is evidence of a prior small infarct in the inferior left parietal lobe, stable. There is patchy small vessel disease in the centra semiovale bilaterally, stable. No acute infarct evident on this study. Vascular: No hyperdense vessel. There is slight vascular calcification in the carotid siphon regions bilaterally as well as in each distal vertebral artery. Skull: The bony calvarium appears intact. Sinuses/Orbits: Visualized paranasal sinuses are clear. Orbits appear symmetric bilaterally. Other: Mastoid air cells are clear. IMPRESSION: Age related volume loss with patchy periventricular small vessel disease, stable. Prior small left parietal lobe infarct. No acute infarct. No mass or hemorrhage. There are foci of arterial vascular calcification. Electronically Signed   By: Lowella Grip III M.D.   On:  02/07/2019 10:10   Ct Head Wo Contrast  Result Date: 01/14/2019 CLINICAL DATA:  Cerebral hemorrhage suspected EXAM: CT HEAD WITHOUT CONTRAST TECHNIQUE: Contiguous axial images were  obtained from the base of the skull through the vertex without intravenous contrast. COMPARISON:  CT 02/12/2018 FINDINGS: Brain: Stable gliosis in the left parietal region compatible with remote infarct. Stable parafalcine lipoma and benign dural calcifications. No evidence of acute infarction, hemorrhage, hydrocephalus, extra-axial collection or mass lesion/mass effect. Symmetric prominence of the ventricles, cisterns and sulci compatible with parenchymal volume loss. Patchy areas of white matter hypoattenuation are most compatible with chronic microvascular angiopathy. Vascular: Atherosclerotic calcification of the carotid siphons and intradural vertebral arteries. No hyperdense vessel. Skull: No scalp swelling or hematoma. No calvarial fracture or suspicious osseous lesions. Sinuses/Orbits: Paranasal sinuses and mastoid air cells are predominantly clear. Other: Orbital structures are unremarkable aside from prior lens extractions. IMPRESSION: No acute intracranial abnormality. Stable remote left parietal infarct. Mild parenchymal volume loss and chronic microvascular angiopathy changes. Electronically Signed   By: Lovena Le M.D.   On: 01/14/2019 21:09   Mr Angio Head Wo Contrast  Result Date: 02/07/2019 CLINICAL DATA:  Headache and dizziness over the last day. EXAM: MRI HEAD WITHOUT CONTRAST MRA HEAD WITHOUT CONTRAST TECHNIQUE: Multiplanar, multiecho pulse sequences of the brain and surrounding structures were obtained without intravenous contrast. Angiographic images of the head were obtained using MRA technique without contrast. COMPARISON:  Head CT same day.  MRI 01/18/2017 FINDINGS: MRI HEAD FINDINGS Brain: Diffusion imaging shows a punctate acute infarction in the medial right cerebellum. Small cluster of punctate infarctions are present in the left posterior limb internal capsule region. No other acute finding. No focal abnormality affects the brainstem. Old small vessel cerebellar infarction on the  right. Cerebral hemispheres show old bilateral parieto-occipital cortical and subcortical infarctions which were acute in October of 2018. There are mild chronic small-vessel ischemic changes elsewhere affecting the hemispheric white matter. No mass lesion, hemorrhage, hydrocephalus or extra-axial collection. Vascular: Major vessels at the base of the brain show flow. Skull and upper cervical spine: Negative Sinuses/Orbits: Clear/normal Other: None MRA HEAD FINDINGS Both internal carotid arteries are patent through the skull base and siphon regions. The left internal carotid artery supplies the left middle cerebral artery territory and the predominant supply to both anterior cerebral artery territories. The A1 segment on the right is diminutive. Flow is present in both middle cerebral artery territories, but distal vessels show atherosclerotic narrowing and irregularity. Both vertebral arteries are widely patent to the basilar. No basilar stenosis. Posterior circulation branch vessels are patent. Left PCA is not as well shown on the reconstructed images but on the source images appears to be patent, with signal loss due to tortuosity. IMPRESSION: Subcentimeter acute infarction in the right medial cerebellar hemisphere. Small cluster of punctate acute infarctions in the posterior limb internal capsule on the left. Chronic small-vessel ischemic changes elsewhere affecting the brain. Old bilateral parieto-occipital cortical infarctions. No intracranial large or medium vessel occlusion. Distal vessel atherosclerotic irregularity consistent with small vessel change. Electronically Signed   By: Nelson Chimes M.D.   On: 02/07/2019 12:32   Mr Brain Wo Contrast (neuro Protocol)  Result Date: 02/07/2019 CLINICAL DATA:  Headache and dizziness over the last day. EXAM: MRI HEAD WITHOUT CONTRAST MRA HEAD WITHOUT CONTRAST TECHNIQUE: Multiplanar, multiecho pulse sequences of the brain and surrounding structures were obtained  without intravenous contrast. Angiographic images of the head were obtained using MRA technique without contrast. COMPARISON:  Head  CT same day.  MRI 01/18/2017 FINDINGS: MRI HEAD FINDINGS Brain: Diffusion imaging shows a punctate acute infarction in the medial right cerebellum. Small cluster of punctate infarctions are present in the left posterior limb internal capsule region. No other acute finding. No focal abnormality affects the brainstem. Old small vessel cerebellar infarction on the right. Cerebral hemispheres show old bilateral parieto-occipital cortical and subcortical infarctions which were acute in October of 2018. There are mild chronic small-vessel ischemic changes elsewhere affecting the hemispheric white matter. No mass lesion, hemorrhage, hydrocephalus or extra-axial collection. Vascular: Major vessels at the base of the brain show flow. Skull and upper cervical spine: Negative Sinuses/Orbits: Clear/normal Other: None MRA HEAD FINDINGS Both internal carotid arteries are patent through the skull base and siphon regions. The left internal carotid artery supplies the left middle cerebral artery territory and the predominant supply to both anterior cerebral artery territories. The A1 segment on the right is diminutive. Flow is present in both middle cerebral artery territories, but distal vessels show atherosclerotic narrowing and irregularity. Both vertebral arteries are widely patent to the basilar. No basilar stenosis. Posterior circulation branch vessels are patent. Left PCA is not as well shown on the reconstructed images but on the source images appears to be patent, with signal loss due to tortuosity. IMPRESSION: Subcentimeter acute infarction in the right medial cerebellar hemisphere. Small cluster of punctate acute infarctions in the posterior limb internal capsule on the left. Chronic small-vessel ischemic changes elsewhere affecting the brain. Old bilateral parieto-occipital cortical  infarctions. No intracranial large or medium vessel occlusion. Distal vessel atherosclerotic irregularity consistent with small vessel change. Electronically Signed   By: Nelson Chimes M.D.   On: 02/07/2019 12:32   US Carotid Bilateral (at [facility] Only)  Result Date: 02/08/2019 CLINICAL DATA:  Vision loss. Blurriness. History of stroke/TIA hypertension. EXAM: BILATERAL CAROTID DUPLEX ULTRASOUND TECHNIQUE: Pearline Cables scale imaging, color Doppler and duplex ultrasound were performed of bilateral carotid and vertebral arteries in the neck. COMPARISON:  None. FINDINGS: Criteria: Quantification of carotid stenosis is based on velocity parameters that correlate the residual internal carotid diameter with NASCET-based stenosis levels, using the diameter of the distal internal carotid lumen as the denominator for stenosis measurement. The following velocity measurements were obtained: RIGHT ICA: 98/23 cm/sec CCA: 0000000 cm/sec SYSTOLIC ICA/CCA RATIO:  1.0 ECA: 88 cm/sec LEFT ICA: 91/9 cm/sec CCA: [id number] cm/sec SYSTOLIC ICA/CCA RATIO:  0.9 ECA: 88 cm/sec RIGHT CAROTID ARTERY: There is no grayscale evidence of significant intimal thickening or atherosclerotic plaque affecting the interrogated portions of the right carotid system. There are no elevated peak systolic velocities within the interrogated course of the right internal carotid artery to suggest a hemodynamically significant stenosis. RIGHT VERTEBRAL ARTERY:  Antegrade Flow LEFT CAROTID ARTERY: There is no grayscale evidence of significant intimal thickening or atherosclerotic plaque affecting the interrogated portions of the left carotid system. There are no elevated peak systolic velocities within the interrogated course of the left internal carotid artery to suggest a hemodynamically significant stenosis. LEFT VERTEBRAL ARTERY:  Antegrade Flow IMPRESSION: Unremarkable carotid Doppler ultrasound. Electronically Signed   By: Sandi Mariscal M.D.   On: 02/08/2019 11:17     Ct Head Code Stroke Wo Contrast  Result Date: 02/06/2019 CLINICAL DATA:  Code stroke. Unexplained altered level of consciousness. Speech disturbance. Code stroke. Difficulty beginning 1530 hours EXAM: CT HEAD WITHOUT CONTRAST TECHNIQUE: Contiguous axial images were obtained from the base of the skull through the vertex without intravenous contrast. COMPARISON:  01/14/2019 FINDINGS:  Brain: Mild age related volume loss. Mild chronic small-vessel change of the hemispheric white matter. No sign of acute infarction, mass lesion, hemorrhage, hydrocephalus or extra-axial collection. Old small left inferior parietal cortical infarction. Vascular: There is atherosclerotic calcification of the major vessels at the base of the brain. Skull: Negative Sinuses/Orbits: Clear/normal Other: None ASPECTS ([place] Stroke Program Early CT Score) - Ganglionic level infarction (caudate, lentiform nuclei, internal capsule, insula, M1-M3 cortex): 7 - Supraganglionic infarction (M4-M6 cortex): 3 Total score (0-10 with 10 being normal): 10 IMPRESSION: 1. No acute finding by CT. Age related volume loss and chronic small-vessel change of the white matter. Old small left inferior parietal infarction. 2. ASPECTS is 10. 3. These results were called by telephone at the time of interpretation on 02/06/2019 at 4:45 pm to provider JOSEPH ZAMMIT , who verbally acknowledged these results. Electronically Signed   By: Nelson Chimes M.D.   On: 02/06/2019 16:46     Microbiology: Recent Results (from the past 240 hour(s))  SARS CORONAVIRUS 2 (Jenin [place]all 6-24 HRS) Nasopharyngeal Nasopharyngeal Swab     Status: None   Collection Time: 02/07/19  1:04 PM   Specimen: Nasopharyngeal Swab  Result Value Ref Range Status   SARS Coronavirus 2 NEGATIVE NEGATIVE Final    Comment: (NOTE) SARS-CoV-2 target nucleic acids are NOT DETECTED. The SARS-CoV-2 RNA is generally detectable in upper and lower respiratory specimens during the acute phase of infection.  Negative results do not preclude SARS-CoV-2 infection, do not rule out co-infections with other pathogens, and should not be used as the sole basis for treatment or other patient management decisions. Negative results must be combined with clinical observations, patient history, and epidemiological information. The expected result is Negative. Fact Sheet for Patients: SugarRoll.be Fact Sheet for Healthcare Providers: https://www.woods-mathews.com/ This test is not yet approved or cleared by the [place] FDA and  has been authorized for detection and/or diagnosis of SARS-CoV-2 by FDA under an Emergency Use Authorization (EUA). This EUA will remain  in effect (meaning this test can be used) for the duration of the COVID-19 declaration under Section 56 4(b)(1) of the Act, 21 U.S.C. section 360bbb-3(b)(1), unless the authorization is terminated or revoked sooner. Performed at [facility], [place] [address], [place], [place] [address]      Labs: Basic Metabolic Panel: Recent Labs  Lab 02/06/19 1649 02/06/19 1650 02/07/19 1056  NA 133* 139 136  K 3.1* 3.3* 3.8  CL 98 98 98  CO2 27  --  28  GLUCOSE 113* 109* 122*  BUN 15 15 12   CREATININE 0.68 0.80 0.67  CALCIUM 8.7*  --  9.1   Liver Function Tests: Recent Labs  Lab 02/06/19 1649  AST 21  ALT 17  ALKPHOS 83  BILITOT 0.4  PROT 7.0  ALBUMIN 3.8   No results for input(s): LIPASE, AMYLASE in the last 168 hours. No results for input(s): AMMONIA in the last 168 hours. CBC: Recent Labs  Lab 02/06/19 1649 02/06/19 1650 02/07/19 1056  WBC 11.1*  --  11.0*  NEUTROABS 6.7  --   --   HGB 14.7 15.6* 14.8  HCT 47.2* 46.0 48.4*  MCV 89.4  --  90.6  PLT 258  --  281   Cardiac Enzymes: No results for input(s): CKTOTAL, CKMB, CKMBINDEX, TROPONINI in the last 168 hours. BNP: Invalid input(s): POCBNP CBG: Recent Labs  Lab 02/06/19 1643 02/07/19 1633 02/08/19 0751    GLUCAP 98 112* 117*    Time coordinating discharge:  36 minutes  Signed:  Orson Eva, DO [facility] Pager: [phone] 02/10/2019, 6:08 PM

## 2019-02-12 NOTE — Progress Notes (Signed)
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 S/P total knee replacement surgery 2015. She presented to the hospital 02/06/2019 with vision changes and aphasia. CT of the head was negative. The ER physician recommended hospital admission for possible TIA evaluation but she declined and went home AMA. She continued to have difficulty recalling words, speech difficulties and vision changes so she went back to the ED on 11/17. A repeat CT of the head was negative. A brain MRI showed findings of acute infarct in the right medial cerebellum and acute infarctions in the posterior limb of the internal capsule on the left. She refused hospital admission and she went home AMA before being seen by neurology.  She presents today for further evaluation regarding rectal bleeding which started 2 weeks prior to having acute stroke.  She reported seeing bright red blood on the toilet tissue on and off since she has been on Eliquis.  However, approximately 2 weeks ago she developed anal itchiness and she noticed more frequent bright red blood on the toilet tissue.  No significant rectal pain.  She denied having any significant constipation but occasionally would strain.  She started taking Metamucil, a stool softener and a probiotic.  She is no longer seeing any rectal bleeding.  Her mother has a history of colon cancer.  A maternal aunt and uncle also had colon cancer.  Labs 02/07/2019 showed a hemoglobin of 14.8 and hematocrit 48.4.  She underwent a colonoscopy by Dr. Silverio Decamp 03/15/2017 which identified a 20 mm tubulovillous polyp to the ascending colon and 6 polyps were removed from the transverse colon.  She developed a  post polypectomy bleed and she presented to [facility]/18/2017.  Admission hemoglobin was 13.9.  A repeat colonoscopy was done 03/19/2017 by Dr. Simona Huh which identified a single solitary ulcer in the proximal ascending colon, a clip was placed, a 3 mm polyp in the cecum was also identified, diverticulosis.  Specimens were not collected as she was on Eliquis at that time.  She was advised to repeat a colonoscopy in the near future with Dr.Nandigam which has not been done.  Colonoscopy 03/19/2017 by Dr. Loletha Carrow due to post polypectomy bleed. - One 3 mm polyp in the cecum. - A single (solitary) ulcer in the proximal ascending colon. Clips (MR conditional) were placed. - Diverticulosis in the sigmoid colon. - The examination was otherwise normal on direct and retroflexion views. - No specimens collected. Pt was on Eliquis.  -Plan to repeat colonoscopy with Dr. Silverio Decamp  Colonoscopy 03/15/2017 by Dr. Silverio Decamp: - One 20 mm tubulovillous polyp in the ascending colon, removed with a hot snare. Resected and retrieved. - Five 1 to 2 mm (tubular adenomatous, sessile serrated and hyperplastic) polyps in the rectum and in the transverse colon, removed with a cold biopsy forceps. Resected and retrieved. - One 5 mm polyp in the transverse colon, removed with a cold snare. Resected and retrieved. - Diverticulosis in the sigmoid colon, in the descending colon, in the transverse colon and in the ascending colon. - Non-bleeding internal hemorrhoids.    CBC Latest Ref Rng & Units 02/07/2019 02/06/2019 02/06/2019  WBC 4.0 - 10.5 K/uL 11.0(H) - 11.1(H)  Hemoglobin 12.0 -  15.0 g/dL 14.8 15.6(H) 14.7  Hematocrit 36.0 - 46.0 % 48.4(H) 46.0 47.2(H)  Platelets 150 - 400 K/uL 281 - 258   CMP Latest Ref Rng & Units 02/07/2019 02/06/2019 02/06/2019  Glucose 70 - 99 mg/dL 122(H) 109(H) 113(H)  BUN 8 - 23 mg/dL 12 15 15   Creatinine 0.44 - 1.00 mg/dL 0.67 0.80 0.68  Sodium 135 - 145 mmol/L 136 139 133(L)    Potassium 3.5 - 5.1 mmol/L 3.8 3.3(L) 3.1(L)  Chloride 98 - 111 mmol/L 98 98 98  CO2 22 - 32 mmol/L 28 - 27  Calcium 8.9 - 10.3 mg/dL 9.1 - 8.7(L)  Total Protein 6.5 - 8.1 g/dL - - 7.0  Total Bilirubin 0.3 - 1.2 mg/dL - - 0.4  Alkaline Phos 38 - 126 U/L - - 83  AST 15 - 41 U/L - - 21  ALT 0 - 44 U/L - - 17   ECHO 02/08/2019: Left Ventricle: Left ventricular ejection fraction, by visual estimation, is 60 to 65%. The left ventricle has normal function. There is moderately increased left ventricular hypertrophy. Left ventricular diastolic parameters are consistent with Grade II diastolic dysfunction (pseudonormalization). Elevated left atrial pressure.   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   Diabetes mellitus without complication ([place])    stated not diabetic but her AIC has been high so she is monitored closely    Endometrial polyp 04/2000   Fall 01/2018   nose fracture    Heart murmur    Hemorrhoids    Hypertension    Kidney stones    Obesity    Osteopenia 05/2007   Paroxysmal atrial fibrillation ([place])    Peripheral neuropathy    PMB (postmenopausal bleeding) 04/2000   SAB (spontaneous abortion) 1981   Sleep apnea    CPAP   Stroke ([place])    SVD (spontaneous vaginal delivery) 04/1964, 05/1978   x3   TIA (transient ischemic attack)      Current Medications, Allergies, Past Medical History, Past Surgical History, Family History and Social History were reviewed in [employer] record.   Physical Exam: LMP 01/22/2011  General: Well developed  73 year old female in no acute distress Head: Normocephalic and atraumatic Eyes:  sclerae anicteric, conjunctiva pink  Ears: Normal auditory acuity Lungs: Clear  throughout to auscultation Heart: Regular rate and rhythm Abdomen: Soft, non tender and non distended. No masses, no hepatomegaly. Normal bowel sounds Rectal: Deferred.  Musculoskeletal: Symmetrical with no gross deformities  Extremities: No edema  Neurological: Alert oriented x 4, grossly nonfocal Psychological:  Alert and cooperative. Normal mood and affect  Assessment and Recommendations:  50. 73 year old female with rectal bleeding which has abated -Patient will call our office if her rectal bleeding recurs -Continue fiber, stool softener and probiotic -Desitin for hemorrhoidal care as directed -Repeat colonoscopy, see plan in #4  2. Acute CVA acute infarct in the right medial cerebellum and acute infarctions in the posterior limb of the internal capsule on the left -I recommended neurology follow-up  3. Atrial fibrillation on Eliqis   4.  History of colon polyps including a 97mm tubulovillous polyp to the ascending colon 03/15/2017, a 3 mm polyp to the  cecum remains in situ -Repeat colonoscopy deferred at this time as the patient was diagnosed with an acute CVA 02/07/2019 -Patient to follow-up with Dr. Silverio Decamp in 3 months to verify appropriate repeat colonoscopy date  5. Family history of colon cancer   6. Sleep Apnea  7. DM II

## 2019-02-13 ENCOUNTER — Encounter: Payer: Self-pay | Admitting: Nurse Practitioner

## 2019-02-13 ENCOUNTER — Ambulatory Visit (INDEPENDENT_AMBULATORY_CARE_PROVIDER_SITE_OTHER): Payer: BC Managed Care – PPO | Admitting: Nurse Practitioner

## 2019-02-13 VITALS — BP 148/78 | HR 72 | Temp 98.1°F | Ht 59.0 in | Wt 256.2 lb

## 2019-02-13 DIAGNOSIS — K625 Hemorrhage of anus and rectum: Secondary | ICD-10-CM | POA: Diagnosis not present

## 2019-02-13 DIAGNOSIS — K648 Other hemorrhoids: Secondary | ICD-10-CM

## 2019-02-13 DIAGNOSIS — Z86010 Personal history of colonic polyps: Secondary | ICD-10-CM

## 2019-02-13 NOTE — Patient Instructions (Addendum)
If you are age 74 or older, your body mass index should be between 23-30. Your Body mass index is 51.76 kg/m. If this is out of the aforementioned range listed, please consider follow up with your Primary Care Provider.  If you are age 47 or younger, your body mass index should be between 19-25. Your Body mass index is 51.76 kg/m. If this is out of the aformentioned range listed, please consider follow up with your Primary Care Provider.   Continue with fiber, probiotic, and stool softener.  Call the office if rectal bleeding reoccurs.  Apply a small amount of Desitin inside the anal opening and the external anal area twice a day as needed.  Follow up with Dr Silverio Decamp in 3 months. Please contact us at the end of January to schedule this appointment.

## 2019-02-20 NOTE — Progress Notes (Signed)
Reviewed and agree with documentation and assessment and plan. K. Veena Jhamal Plucinski , MD   

## 2019-02-22 NOTE — ED Provider Notes (Signed)
[facility] EMERGENCY DEPARTMENT Provider Note   CSN: [id number] Arrival date & time: 02/06/19  1630     History   Chief Complaint Chief Complaint  Patient presents with  . Code Stroke    HPI Nicole Cabrera is a 73 y.o. female.     Patient states at 330 she started having difficulty with her vision and trying to get the words out.  The history is provided by the patient. No language interpreter was used.  Weakness Severity:  Mild Onset quality:  Sudden Timing:  Constant Progression:  Unchanged Chronicity:  New Context: not alcohol use   Relieved by:  Nothing Worsened by:  Nothing Ineffective treatments:  None tried Associated symptoms: no abdominal pain, no chest pain, no cough, no diarrhea, no frequency, no headaches and no seizures     Past Medical History:  Diagnosis Date  . Allergy   . Anxiety    xanax  . Arthritis    shoulders/hips - tx with OTC meds  . Breast cyst 03/1990   pt denies 12/05/13  . Bronchitis   . Cataract   . Cervical tab 1972  . 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 Diabetes mellitus without complication ([place])    stated not diabetic but her AIC has been high so she is monitored closely   . Endometrial polyp 04/2000  . Fall 01/2018   nose fracture   . Heart murmur   . Hemorrhoids   . Hypertension   . Kidney stones   . Obesity   . Osteopenia 05/2007  . Paroxysmal atrial fibrillation (HCC)   . Peripheral neuropathy   . PMB (postmenopausal bleeding) 04/2000  . SAB (spontaneous abortion) 1981  . Sleep apnea    CPAP  . Stroke ([place])   . SVD (spontaneous vaginal delivery) 04/1964, 05/1978   x3  . TIA (transient ischemic attack)     Patient Active Problem List   Diagnosis Date Noted  . Acute ischemic stroke ([place]) 02/08/2019  . Acute CVA (cerebrovascular accident) ([place]) 02/07/2019  . Coagulopathy ([place]) 02/07/2019  . Anticoagulated 02/07/2019  . Visual changes 02/07/2019  . Leukocytosis 02/07/2019  . Hyperglycemia  02/07/2019  . Diabetes mellitus without complication ([place])   . Hypokalemia 03/19/2017  . HLD (hyperlipidemia) 03/19/2017  . Hematochezia   . Acute blood loss anemia   . Rectal bleeding 03/18/2017  . Acute GI bleeding   . 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 Stroke (cerebrum) ([place]) 01/18/2017  . Intracerebral hemorrhage 01/18/2017  . Elevated troponin 01/17/2017  . Atrial fibrillation ([place]) 10/27/2016  . Obesity 06/20/2015  . Nephrolithiasis 07/09/2014  . Asthma with bronchitis 02/23/2014  . Aphasia 02/27/2013  . ACUTE BRONCHITIS 08/30/2009  . Essential hypertension 02/22/2007  . Obstructive sleep apnea 02/22/2007  . ALLERGY 02/22/2007    Past Surgical History:  Procedure Laterality Date  . CATARACT EXTRACTION Right 12/07/2018  . CERVICAL DISC SURGERY  2/93  . CESAREAN SECTION  6/83   with BTL  . COLONOSCOPY N/A 03/19/2017   Procedure: COLONOSCOPY;  Surgeon: Doran Stabler, MD;  Location: [place];  Service: Gastroenterology;  Laterality: N/A;  . HYSTEROSCOPY  5/02, 01/2011  . LITHOTRIPSY  08/30/2014   with Dr. Jeffie Pollock at [facility]  . TOTAL KNEE ARTHROPLASTY Right 6/08   right  . TOTAL KNEE ARTHROPLASTY Left 4/10   left   . WRIST SURGERY Left 4/07   left     OB History    Gravida  5   Para  3   Term      Preterm      AB  2   Living  3     SAB  1   TAB  1   Ectopic      Multiple      Live Births               Home Medications    Prior to Admission medications   Medication Sig Start Date End Date Taking? Authorizing Provider  acetaminophen (TYLENOL) 500 MG tablet Take 1,000 mg by mouth 2 (two) times daily as needed for headache.   Yes [provider]  apixaban (ELIQUIS) 5 MG TABS tablet Take 1 tablet (5 mg total) by mouth 2 (two) times daily. 09/26/18  Yes Branch, Alphonse Guild, MD  chlorthalidone (HYGROTON) 25 MG tablet Take 1 tablet (25 mg total) by mouth daily. 12/16/18 03/16/19 Yes BranchAlphonse Guild, MD   cholecalciferol (VITAMIN D) 1000 UNITS tablet Take 1,000 Units by mouth every morning.    Yes [provider]  losartan (COZAAR) 100 MG tablet Take 1 tablet (100 mg total) by mouth daily. 12/16/18 03/16/19 Yes Branch, Alphonse Guild, MD  metoprolol tartrate (LOPRESSOR) 25 MG tablet Take 1 tablet (25 mg total) by mouth 2 (two) times daily. May take an extra tablet as needed for palpitations 03/24/18  Yes Strader, [place], PA-C  Multiple Vitamins-Minerals (MULTIVITAMIN WITH MINERALS) tablet Take 1 tablet by mouth every morning. Centrum Silver Womens 50+   Yes [provider]  Polyethyl Glycol-Propyl Glycol (SYSTANE OP) Place 1 drop into both eyes daily as needed (dry eyes).    Yes [provider]  potassium chloride SA (K-DUR) 20 MEQ tablet TAKE ONE TABLET BY MOUTH DAILY. Patient taking differently: Take 20 mEq by mouth daily.  10/13/18  Yes Branch, Alphonse Guild, MD  PROAIR HFA 108 [phone] Base) MCG/ACT inhaler INHALE TWO PUFFS BY MOUTH EVERY 6 HOURS AS NEEDED FOR WHEEZING AND SHORTNESS OF BREATH Patient taking differently: Inhale 2 puffs into the lungs every 6 (six) hours as needed.  11/16/17  Yes Young, Tarri Fuller D, MD  sodium chloride (OCEAN) 0.65 % SOLN nasal spray Place 1 spray into both nostrils as needed for congestion.   Yes [provider]  ALPRAZolam (XANAX) 0.25 MG tablet Take 0.25 mg by mouth daily as needed for anxiety.  02/01/18   [provider]    Family History Family History  Problem Relation Age of Onset  . Heart disease Father   . Colon cancer Mother   . Heart disease Mother   . Breast cancer Maternal Aunt   . Colon cancer Maternal Aunt   . Colon cancer Maternal Uncle   . Ehlers-Danlos syndrome Other        neice-being tested 4/15  . Stomach cancer Neg Hx   . Pancreatic cancer Neg Hx     Social History Social History   Tobacco Use  . Smoking status: Never Smoker  . Smokeless tobacco: Never Used  Substance Use Topics  . Alcohol use:  No    Alcohol/week: 0.0 - 1.0 standard drinks  . Drug use: No     Allergies   Bactrim [sulfamethoxazole-trimethoprim], Ciprofloxacin, Hydroxyzine hcl, Keflet [cephalexin], Lipitor [atorvastatin], Lisinopril, Spironolactone, Vistaril [hydroxyzine hcl], Erythromycin, Latex, Penicillins, Tape, and Tetracycline   Review of Systems Review of Systems  Constitutional: Negative for appetite change and fatigue.  HENT: Negative for congestion, ear discharge and sinus pressure.        Blurred  vision  Eyes: Negative for discharge.  Respiratory: Negative for cough.   Cardiovascular: Negative for chest pain.  Gastrointestinal: Negative for abdominal pain and diarrhea.  Genitourinary: Negative for frequency and hematuria.  Musculoskeletal: Negative for back pain.  Skin: Negative for rash.  Neurological: Positive for weakness. Negative for seizures and headaches.       Difficulty getting words out  Psychiatric/Behavioral: Negative for hallucinations.     Physical Exam Updated Vital Signs BP (!) 147/82   Pulse 72   Temp 97.7 F (36.5 C) (Oral)   Resp (!) 27   Ht 4\' 11"  (1.499 m)   Wt 114.8 kg   LMP 01/22/2011   SpO2 96%   BMI 51.10 kg/m   Physical Exam Vitals signs and nursing note reviewed.  Constitutional:      Appearance: She is well-developed.  HENT:     Head: Normocephalic.     Nose: Nose normal.  Eyes:     General: No scleral icterus.    Conjunctiva/sclera: Conjunctivae normal.  Neck:     Musculoskeletal: Neck supple.     Thyroid: No thyromegaly.  Cardiovascular:     Rate and Rhythm: Normal rate and regular rhythm.     Heart sounds: No murmur. No friction rub. No gallop.   Pulmonary:     Breath sounds: No stridor. No wheezing or rales.  Chest:     Chest wall: No tenderness.  Abdominal:     General: There is no distension.     Tenderness: There is no abdominal tenderness. There is no rebound.  Musculoskeletal: Normal range of motion.  Lymphadenopathy:      Cervical: No cervical adenopathy.  Skin:    Findings: No erythema or rash.  Neurological:     Mental Status: She is oriented to person, place, and time.     Motor: No abnormal muscle tone.     Coordination: Coordination normal.     Comments: Patient had some difficulty with exam  Psychiatric:        Behavior: Behavior normal.      ED Treatments / Results  Labs (all labs ordered are listed, but only abnormal results are displayed) Labs Reviewed  APTT - Abnormal; Notable for the following components:      Result Value   aPTT 37 (*)    All other components within normal limits  CBC - Abnormal; Notable for the following components:   WBC 11.1 (*)    RBC 5.28 (*)    HCT 47.2 (*)    All other components within normal limits  COMPREHENSIVE METABOLIC PANEL - Abnormal; Notable for the following components:   Sodium 133 (*)    Potassium 3.1 (*)    Glucose, Bld 113 (*)    Calcium 8.7 (*)    All other components within normal limits  I-STAT CHEM 8, ED - Abnormal; Notable for the following components:   Potassium 3.3 (*)    Glucose, Bld 109 (*)    Hemoglobin 15.6 (*)    All other components within normal limits  ETHANOL  PROTIME-INR  DIFFERENTIAL  CBG MONITORING, ED    EKG EKG Interpretation  Date/Time:  Monday February 06 2019 16:48:04 EST Ventricular Rate:  79 PR Interval:    QRS Duration: 102 QT Interval:  399 QTC Calculation: 458 R Axis:   151 Text Interpretation: Sinus rhythm Multiple premature complexes, vent & supraven Prolonged PR interval Right axis deviation Low voltage, precordial leads Confirmed by Julianne Rice [phone]) on 02/07/2019 3:05:31 PM  Radiology No results found.  Procedures Procedures (including critical care time)  Medications Ordered in ED Medications - No data to display   Initial Impression / Assessment and Plan / ED Course  I have reviewed the triage vital signs and the nursing notes.  Pertinent labs & imaging results that were  available during my care of the patient were reviewed by me and considered in my medical decision making (see chart for details).    CRITICAL CARE Performed by: Milton Ferguson Total critical care time: 45 minutes Critical care time was exclusive of separately billable procedures and treating other patients. Critical care was necessary to treat or prevent imminent or life-threatening deterioration. Critical care was time spent personally by me on the following activities: development of treatment plan with patient and/or surrogate as well as nursing, discussions with consultants, evaluation of patient's response to treatment, examination of patient, obtaining history from patient or surrogate, ordering and performing treatments and interventions, ordering and review of laboratory studies, ordering and review of radiographic studies, pulse oximetry and re-evaluation of patient's condition.     Patient was seen by neurology and they recommended admission for TIA work-up and MRI.  Patient decided to leave [place]  Final Clinical Impressions(s) / ED Diagnoses   Final diagnoses:  TIA (transient ischemic attack)    ED Discharge Orders    None       Milton Ferguson, MD 02/22/19 2313

## 2019-03-07 ENCOUNTER — Telehealth: Payer: Self-pay | Admitting: Cardiology

## 2019-03-07 NOTE — Telephone Encounter (Signed)
Per phone call from Maudie Mercury at [facility]-- pt is needing a prior auth for her  apixaban (ELIQUIS) 5 MG TABS tablet [id number]  Sent in.

## 2019-03-08 NOTE — Telephone Encounter (Signed)
Prior authorization complete. Samples provided to pt.

## 2019-03-27 ENCOUNTER — Other Ambulatory Visit: Payer: Self-pay | Admitting: Student

## 2019-04-04 ENCOUNTER — Other Ambulatory Visit: Payer: Self-pay

## 2019-04-04 DIAGNOSIS — N200 Calculus of kidney: Secondary | ICD-10-CM

## 2019-04-06 ENCOUNTER — Other Ambulatory Visit: Payer: Self-pay | Admitting: Cardiology

## 2019-05-03 ENCOUNTER — Other Ambulatory Visit: Payer: Self-pay | Admitting: Cardiology

## 2019-05-17 DIAGNOSIS — Z23 Encounter for immunization: Secondary | ICD-10-CM | POA: Diagnosis not present

## 2019-06-14 DIAGNOSIS — Z23 Encounter for immunization: Secondary | ICD-10-CM | POA: Diagnosis not present

## 2019-07-25 DIAGNOSIS — Z961 Presence of intraocular lens: Secondary | ICD-10-CM | POA: Diagnosis not present

## 2019-07-25 DIAGNOSIS — H26493 Other secondary cataract, bilateral: Secondary | ICD-10-CM | POA: Diagnosis not present

## 2019-07-25 DIAGNOSIS — H35033 Hypertensive retinopathy, bilateral: Secondary | ICD-10-CM | POA: Diagnosis not present

## 2019-07-25 DIAGNOSIS — H04123 Dry eye syndrome of bilateral lacrimal glands: Secondary | ICD-10-CM | POA: Diagnosis not present

## 2019-07-26 DIAGNOSIS — M25551 Pain in right hip: Secondary | ICD-10-CM | POA: Diagnosis not present

## 2019-07-27 DIAGNOSIS — M25551 Pain in right hip: Secondary | ICD-10-CM | POA: Diagnosis not present

## 2019-08-02 DIAGNOSIS — L600 Ingrowing nail: Secondary | ICD-10-CM | POA: Diagnosis not present

## 2019-08-02 DIAGNOSIS — M79674 Pain in right toe(s): Secondary | ICD-10-CM | POA: Diagnosis not present

## 2019-08-02 DIAGNOSIS — L03031 Cellulitis of right toe: Secondary | ICD-10-CM | POA: Diagnosis not present

## 2019-08-11 ENCOUNTER — Other Ambulatory Visit: Payer: Self-pay

## 2019-08-11 ENCOUNTER — Emergency Department (HOSPITAL_COMMUNITY)
Admission: EM | Admit: 2019-08-11 | Discharge: 2019-08-12 | Disposition: A | Discharge status: Home / Self Care | Payer: BC Managed Care – PPO | Attending: Emergency Medicine | Admitting: Emergency Medicine

## 2019-08-11 ENCOUNTER — Encounter (HOSPITAL_COMMUNITY): Payer: Self-pay | Admitting: Emergency Medicine

## 2019-08-11 DIAGNOSIS — Z91040 Latex allergy status: Secondary | ICD-10-CM | POA: Diagnosis not present

## 2019-08-11 DIAGNOSIS — E119 Type 2 diabetes mellitus without complications: Secondary | ICD-10-CM | POA: Insufficient documentation

## 2019-08-11 DIAGNOSIS — N939 Abnormal uterine and vaginal bleeding, unspecified: Secondary | ICD-10-CM | POA: Diagnosis not present

## 2019-08-11 DIAGNOSIS — I10 Essential (primary) hypertension: Secondary | ICD-10-CM | POA: Insufficient documentation

## 2019-08-11 DIAGNOSIS — Z79899 Other long term (current) drug therapy: Secondary | ICD-10-CM | POA: Insufficient documentation

## 2019-08-11 DIAGNOSIS — D259 Leiomyoma of uterus, unspecified: Secondary | ICD-10-CM | POA: Diagnosis not present

## 2019-08-11 DIAGNOSIS — Z8673 Personal history of transient ischemic attack (TIA), and cerebral infarction without residual deficits: Secondary | ICD-10-CM | POA: Insufficient documentation

## 2019-08-11 DIAGNOSIS — Z96653 Presence of artificial knee joint, bilateral: Secondary | ICD-10-CM | POA: Insufficient documentation

## 2019-08-11 DIAGNOSIS — N950 Postmenopausal bleeding: Secondary | ICD-10-CM | POA: Diagnosis not present

## 2019-08-11 DIAGNOSIS — Z7901 Long term (current) use of anticoagulants: Secondary | ICD-10-CM | POA: Diagnosis not present

## 2019-08-11 DIAGNOSIS — M25551 Pain in right hip: Secondary | ICD-10-CM | POA: Diagnosis not present

## 2019-08-11 LAB — CBC WITH DIFFERENTIAL/PLATELET
Abs Immature Granulocytes: 0.05 10*3/uL (ref 0.00–0.07)
Basophils Absolute: 0.1 10*3/uL (ref 0.0–0.1)
Basophils Relative: 1 %
Eosinophils Absolute: 0.4 10*3/uL (ref 0.0–0.5)
Eosinophils Relative: 4 %
HCT: 43.8 % (ref 36.0–46.0)
Hemoglobin: 13.6 g/dL (ref 12.0–15.0)
Immature Granulocytes: 1 %
Lymphocytes Relative: 25 %
Lymphs Abs: 2.4 10*3/uL (ref 0.7–4.0)
MCH: 28 pg (ref 26.0–34.0)
MCHC: 31.1 g/dL (ref 30.0–36.0)
MCV: 90.3 fL (ref 80.0–100.0)
Monocytes Absolute: 0.8 10*3/uL (ref 0.1–1.0)
Monocytes Relative: 9 %
Neutro Abs: 5.8 10*3/uL (ref 1.7–7.7)
Neutrophils Relative %: 60 %
Platelets: 284 10*3/uL (ref 150–400)
RBC: 4.85 MIL/uL (ref 3.87–5.11)
RDW: 14.3 % (ref 11.5–15.5)
WBC: 9.5 10*3/uL (ref 4.0–10.5)
nRBC: 0 % (ref 0.0–0.2)

## 2019-08-11 LAB — COMPREHENSIVE METABOLIC PANEL
ALT: 37 U/L (ref 0–44)
AST: 31 U/L (ref 15–41)
Albumin: 3.4 g/dL — ABNORMAL LOW (ref 3.5–5.0)
Alkaline Phosphatase: 84 U/L (ref 38–126)
Anion gap: 10 (ref 5–15)
BUN: 13 mg/dL (ref 8–23)
CO2: 28 mmol/L (ref 22–32)
Calcium: 9.2 mg/dL (ref 8.9–10.3)
Chloride: 98 mmol/L (ref 98–111)
Creatinine, Ser: 0.8 mg/dL (ref 0.44–1.00)
GFR calc Af Amer: >60 mL/min (ref >60)
GFR calc non Af Amer: >60 mL/min (ref >60)
Glucose, Bld: 119 mg/dL — ABNORMAL HIGH (ref 70–99)
Potassium: 4 mmol/L (ref 3.5–5.1)
Sodium: 136 mmol/L (ref 135–145)
Total Bilirubin: 0.6 mg/dL (ref 0.3–1.2)
Total Protein: 6.4 g/dL — ABNORMAL LOW (ref 6.5–8.1)

## 2019-08-11 LAB — SAMPLE TO BLOOD BANK

## 2019-08-11 LAB — PROTIME-INR
INR: 1 (ref 0.8–1.2)
Prothrombin Time: 12.9 seconds (ref 11.4–15.2)

## 2019-08-11 NOTE — ED Triage Notes (Addendum)
Patient reports vaginal bleeding with low abdominal cramping onset Monday this week , heavy bleeding today , denies emesis , no fever or chills . Patient is taking Eliquis.

## 2019-08-11 NOTE — ED Provider Notes (Signed)
[facility] EMERGENCY DEPARTMENT Provider Note   CSN: [id number] Arrival date & time: 08/11/19  1938     History Chief Complaint  Patient presents with  . Vaginal Bleeding    Nicole Cabrera is a 74 y.o. female.  HPI     This is a 74 year old female with history of diabetes, hypertension, paroxysmal atrial fibrillation on Xarelto, CVA who presents with vaginal bleeding.  Patient reports she had onset of vaginal bleeding on Monday.  She reports that she previously had vaginal bleeding postmenopausally and had a subsequent endometrial biopsy that was normal.  She reports compliance with the blood thinners.  She states tonight her bleeding got worse and it scared her.  She reports passage of clots.  She woke up from a nap and had blood through a pad and her close.  She denies any dizziness or shortness of breath.  No chest pain.  She reports abdominal cramping that is in the middle of her abdomen and all over.  It does not radiate.  Denies significant pain at this time.  She spoke with Dr. Sabra Heck who recommended that she may need some progesterone.  Past Medical History:  Diagnosis Date  . Allergy   . Anxiety    xanax  . Arthritis    shoulders/hips - tx with OTC meds  . Breast cyst 03/1990   pt denies 12/05/13  . Bronchitis   . Cataract   . Cervical tab 1972  . 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 Diabetes mellitus without complication ([place])    stated not diabetic but her AIC has been high so she is monitored closely   . Endometrial polyp 04/2000  . Fall 01/2018   nose fracture   . Heart murmur   . Hemorrhoids   . Hypertension   . Kidney stones   . Obesity   . Osteopenia 05/2007  . Paroxysmal atrial fibrillation (HCC)   . Peripheral neuropathy   . PMB (postmenopausal bleeding) 04/2000  . SAB (spontaneous abortion) 1981  . Sleep apnea    CPAP  . Stroke ([place])   . SVD (spontaneous vaginal delivery) 04/1964, 05/1978   x3  . TIA (transient  ischemic attack)     Patient Active Problem List   Diagnosis Date Noted  . Acute ischemic stroke ([place]) 02/08/2019  . Acute CVA (cerebrovascular accident) ([place]) 02/07/2019  . Coagulopathy ([place]) 02/07/2019  . Anticoagulated 02/07/2019  . Visual changes 02/07/2019  . Leukocytosis 02/07/2019  . Hyperglycemia 02/07/2019  . Diabetes mellitus without complication ([place])   . Hypokalemia 03/19/2017  . HLD (hyperlipidemia) 03/19/2017  . Hematochezia   . Acute blood loss anemia   . Rectal bleeding 03/18/2017  . Acute GI bleeding   . 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 Stroke (cerebrum) ([place]) 01/18/2017  . Intracerebral hemorrhage 01/18/2017  . Elevated troponin 01/17/2017  . Atrial fibrillation ([place]) 10/27/2016  . Obesity 06/20/2015  . Nephrolithiasis 07/09/2014  . Asthma with bronchitis 02/23/2014  . Aphasia 02/27/2013  . ACUTE BRONCHITIS 08/30/2009  . Essential hypertension 02/22/2007  . Obstructive sleep apnea 02/22/2007  . ALLERGY 02/22/2007    Past Surgical History:  Procedure Laterality Date  . CATARACT EXTRACTION Right 12/07/2018  . CERVICAL DISC SURGERY  2/93  . CESAREAN SECTION  6/83   with BTL  . COLONOSCOPY N/A 03/19/2017   Procedure: COLONOSCOPY;  Surgeon: Doran Stabler, MD;  Location: [facility];  Service: Gastroenterology;  Laterality: N/A;  . HYSTEROSCOPY  5/02, 01/2011  .  LITHOTRIPSY  08/30/2014   with Dr. Jeffie Pollock at [facility]  . TOTAL KNEE ARTHROPLASTY Right 6/08   right  . TOTAL KNEE ARTHROPLASTY Left 4/10   left   . WRIST SURGERY Left 4/07   left     OB History    Gravida  5   Para  3   Term      Preterm      AB  2   Living  3     SAB  1   TAB  1   Ectopic      Multiple      Live Births              Family History  Problem Relation Age of Onset  . Heart disease Father   . Colon cancer Mother   . Heart disease Mother   . Breast cancer Maternal Aunt   . Colon cancer Maternal Aunt   . Colon cancer  Maternal Uncle   . Ehlers-Danlos syndrome Other        neice-being tested 4/15  . Stomach cancer Neg Hx   . Pancreatic cancer Neg Hx     Social History   Tobacco Use  . Smoking status: Never Smoker  . Smokeless tobacco: Never Used  Substance Use Topics  . Alcohol use: No    Alcohol/week: 0.0 - 1.0 standard drinks  . Drug use: No    Home Medications Prior to Admission medications   Medication Sig Start Date End Date Taking? Authorizing Provider  acetaminophen (TYLENOL) 500 MG tablet Take 1,000 mg by mouth 2 (two) times daily as needed for headache.    [provider]  ALPRAZolam Duanne Moron) 0.25 MG tablet Take 0.25 mg by mouth daily as needed for anxiety.  02/01/18   [provider]  chlorthalidone (HYGROTON) 25 MG tablet Take 1 tablet (25 mg total) by mouth daily. 12/16/18 03/16/19  Arnoldo Lenis, MD  cholecalciferol (VITAMIN D) 1000 UNITS tablet Take 1,000 Units by mouth every morning.     [provider]  ELIQUIS 5 MG TABS tablet TAKE ONE TABLET BY MOUTH TWICE DAILY. 04/06/19   Arnoldo Lenis, MD  losartan (COZAAR) 100 MG tablet Take 1 tablet (100 mg total) by mouth daily. 12/16/18 03/16/19  Arnoldo Lenis, MD  metoprolol tartrate (LOPRESSOR) 25 MG tablet TAKE ONE TABLET BY MOUTH TWICE DAILY, MAY TAKE  EXTRA TABLET AS NEEDED FOR PALPITATIONS. 03/27/19   Ahmed Prima, Fransisco Hertz, PA-C  Multiple Vitamins-Minerals (MULTIVITAMIN WITH MINERALS) tablet Take 1 tablet by mouth every morning. Centrum Silver Womens 50+    [provider]  Polyethyl Glycol-Propyl Glycol (SYSTANE OP) Place 1 drop into both eyes daily as needed (dry eyes).     [provider]  potassium chloride SA (KLOR-CON) 20 MEQ tablet TAKE ONE TABLET BY MOUTH DAILY. 05/03/19   Arnoldo Lenis, MD  PROAIR HFA 108 [phone] Base) MCG/ACT inhaler INHALE TWO PUFFS BY MOUTH EVERY 6 HOURS AS NEEDED FOR WHEEZING AND SHORTNESS OF BREATH Patient taking differently: Inhale 2 puffs into the  lungs every 6 (six) hours as needed.  11/16/17   Baird Lyons D, MD  sodium chloride (OCEAN) 0.65 % SOLN nasal spray Place 1 spray into both nostrils as needed for congestion.    [provider]    Allergies    Bactrim [sulfamethoxazole-trimethoprim], Ciprofloxacin, Hydroxyzine hcl, Keflet [cephalexin], Lipitor [atorvastatin], Lisinopril, Spironolactone, Vistaril [hydroxyzine hcl], Erythromycin, Latex, Penicillins, Tape, and Tetracycline  Review of Systems  Review of Systems  Constitutional: Negative for fever.  Respiratory: Negative for shortness of breath.   Cardiovascular: Negative for chest pain.  Gastrointestinal: Positive for abdominal pain. Negative for nausea and vomiting.  Genitourinary: Positive for vaginal bleeding. Negative for dysuria and vaginal discharge.  All other systems reviewed and are negative.   Physical Exam Updated Vital Signs BP (!) 143/82   Pulse 80   Temp 98.6 F (37 C) (Oral)   Resp 17   Ht 1.499 m (4\' 11" )   Wt 120 kg   LMP 01/22/2011   SpO2 98%   BMI 53.43 kg/m   Physical Exam Vitals and nursing note reviewed.  Constitutional:      Appearance: She is well-developed. She is obese. She is not ill-appearing.  HENT:     Head: Normocephalic and atraumatic.     Nose: Nose normal.     Mouth/Throat:     Mouth: Mucous membranes are moist.  Eyes:     Pupils: Pupils are equal, round, and reactive to light.  Cardiovascular:     Rate and Rhythm: Normal rate and regular rhythm.     Heart sounds: Normal heart sounds.  Pulmonary:     Effort: Pulmonary effort is normal. No respiratory distress.     Breath sounds: No wheezing.  Abdominal:     General: Bowel sounds are normal.     Palpations: Abdomen is soft.     Tenderness: There is no abdominal tenderness. There is no guarding or rebound.  Genitourinary:    Comments: Moderate blood noted in the vaginal vault, patient tolerated only limited exam Musculoskeletal:     Cervical back: Neck  supple.     Right lower leg: No edema.     Left lower leg: No edema.  Skin:    General: Skin is warm and dry.  Neurological:     Mental Status: She is alert and oriented to person, place, and time.  Psychiatric:        Mood and Affect: Mood normal.     ED Results / Procedures / Treatments   Labs (all labs ordered are listed, but only abnormal results are displayed) Labs Reviewed  COMPREHENSIVE METABOLIC PANEL - Abnormal; Notable for the following components:      Result Value   Glucose, Bld 119 (*)    Total Protein 6.4 (*)    Albumin 3.4 (*)    All other components within normal limits  CBC WITH DIFFERENTIAL/PLATELET  URINALYSIS, ROUTINE W REFLEX MICROSCOPIC  PROTIME-INR  SAMPLE TO BLOOD BANK    EKG None  Radiology US PELVIS (TRANSABDOMINAL ONLY)  Result Date: 08/12/2019 CLINICAL DATA:  Initial evaluation for acute vaginal bleeding with cramping. EXAM: TRANSABDOMINAL ULTRASOUND OF PELVIS TECHNIQUE: Transabdominal ultrasound examination of the pelvis was performed including evaluation of the uterus, ovaries, adnexal regions, and pelvic cul-de-sac. COMPARISON:  None. FINDINGS: Uterus Measurements: 9.2 x 4.5 x 7.1 cm = volume: 150.9 mL. 2.3 x 1.4 x 2.0 cm exophytic fibroid extends from the right uterine fundus. 3.3 x 2.7 x 3.1 cm intramural fibroid present at the left posterior mid uterine body. Endometrium Thickness: 9.7 mm.  No focal abnormality visualized. Right ovary Measurements: 3.0 x 1.5 x 1.8 cm = volume: 4.0 mL. Normal appearance/no adnexal mass. Left ovary Not visualized.  No adnexal mass. Other findings:  No abnormal free fluid. IMPRESSION: 1. Thickened endometrial stripe measuring up to 9.7 mm. In the setting of post-menopausal bleeding, endometrial sampling is indicated to exclude carcinoma. If results are benign,  sonohysterogram should be considered for focal lesion work-up. (Ref: Radiological Reasoning: Algorithmic Workup of Abnormal Vaginal Bleeding with Endovaginal  Sonography and Sonohysterography. AJR [id number]). 2. Fibroid uterus as above. 3. Nonvisualized left ovary, with normal right ovary. No adnexal mass or free fluid. Electronically Signed   By: Jeannine Boga M.D.   On: 08/12/2019 01:48    Procedures Procedures (including critical care time)  Medications Ordered in ED Medications - No data to display  ED Course  I have reviewed the triage vital signs and the nursing notes.  Pertinent labs & imaging results that were available during my care of the patient were reviewed by me and considered in my medical decision making (see chart for details).    MDM Rules/Calculators/A&P                       Patient presents with postmenopausal vaginal bleeding.  She is overall nontoxic and vital signs are reassuring.  She is in no acute distress.  Given that she is postmenopausal, this is concerning for possible endometrial cancer.  Additionally she is on blood thinners which complicates her situation.  She reports being soaked through her pad in her close.  He does not appear in stable.  Lab work reviewed from triage.  Hemoglobin is normal at 13.6.  This is reassuring.  She is not having any dizziness or other systemic symptoms which is also reassuring.  Patient only tolerated limited exam but was noted to have a moderate amount of blood in the vaginal vault.  Will obtain a pelvic ultrasound to evaluate her endometrial stripe and to evaluate for possible fibroids or other cause of bleeding.  Ultrasound does show thickened endometrial stripe.  Patient states that her GYN suggested potentially starting progesterone.  Given her age, would feel more comfortable if her primary GYN made this decision.  Do not feel it is unreasonable given that she likely needs some stabilization of her uterine lining.  Regarding her anticoagulant use, she is maintaining her hemoglobin and benefits likely outweigh risk at this point.  No reason for or indication for  reversal, administration of blood products.  Patient reassured.  Recommend close follow-up with her GYN to start progesterone if this is recommended as well as evaluation for biopsy.  Patient given bleeding return precautions.  After history, exam, and medical workup I feel the patient has been appropriately medically screened and is safe for discharge home. Pertinent diagnoses were discussed with the patient. Patient was given return precautions.   Final Clinical Impression(s) / ED Diagnoses Final diagnoses:  Postmenopausal bleeding    Rx / DC Orders ED Discharge Orders    None       Decker Cogdell, Barbette Hair, MD 08/12/19 [phone]

## 2019-08-12 ENCOUNTER — Emergency Department (HOSPITAL_COMMUNITY): Payer: BC Managed Care – PPO

## 2019-08-12 DIAGNOSIS — D259 Leiomyoma of uterus, unspecified: Secondary | ICD-10-CM | POA: Diagnosis not present

## 2019-08-12 LAB — URINALYSIS, ROUTINE W REFLEX MICROSCOPIC
Bilirubin Urine: NEGATIVE
Glucose, UA: NEGATIVE mg/dL
Hgb urine dipstick: NEGATIVE
Ketones, ur: NEGATIVE mg/dL
Leukocytes,Ua: NEGATIVE
Nitrite: NEGATIVE
Protein, ur: NEGATIVE mg/dL
Specific Gravity, Urine: 1.018 (ref 1.005–1.030)
pH: 5 (ref 5.0–8.0)

## 2019-08-12 NOTE — Discharge Instructions (Addendum)
You were seen today for postmenopausal bleeding.  Your ultrasound does show thickened endometrial stripe and you do need a GYN follow-up and likely a biopsy.  Additionally, you should call Dr. Ammie Ferrier office regarding starting progesterone.  If you have significantly worsening bleeding, develop dizziness or any new or worsening symptoms you should be reevaluated.

## 2019-08-12 NOTE — ED Notes (Signed)
Pt transported to US

## 2019-08-12 NOTE — ED Notes (Signed)
Discharge instructions discussed with pt and family at bedside. Both were able to verbalize understanding with no questions at this time. Pt to go home with family. Wheelchair to car. Walker and cane returned to pt.

## 2019-08-14 ENCOUNTER — Other Ambulatory Visit: Payer: Self-pay

## 2019-08-14 ENCOUNTER — Ambulatory Visit (INDEPENDENT_AMBULATORY_CARE_PROVIDER_SITE_OTHER): Payer: BC Managed Care – PPO | Admitting: Obstetrics & Gynecology

## 2019-08-14 ENCOUNTER — Telehealth: Payer: Self-pay | Admitting: *Deleted

## 2019-08-14 ENCOUNTER — Encounter: Payer: Self-pay | Admitting: Obstetrics & Gynecology

## 2019-08-14 ENCOUNTER — Other Ambulatory Visit (HOSPITAL_COMMUNITY)
Admission: RE | Admit: 2019-08-14 | Discharge: 2019-08-14 | Disposition: A | Discharge status: Home / Self Care | Payer: BC Managed Care – PPO | Source: Ambulatory Visit | Attending: Obstetrics & Gynecology | Admitting: Obstetrics & Gynecology

## 2019-08-14 DIAGNOSIS — N950 Postmenopausal bleeding: Secondary | ICD-10-CM | POA: Diagnosis not present

## 2019-08-14 DIAGNOSIS — N858 Other specified noninflammatory disorders of uterus: Secondary | ICD-10-CM | POA: Diagnosis not present

## 2019-08-14 NOTE — Telephone Encounter (Signed)
-----   Message from Megan Salon, MD sent at 08/13/2019 11:07 PM EDT ----- Regarding: postmenopausal bleeding Pt called on Friday pm at around 6 o'clock.  She is 74 and had bleeding all week.  Increased Friday early evening and was concerned about how much bleeding she was having.  We decided she should go to the ER.  Hb was normal.  Ultrasound shoed 4mm endometrium.  She needs endo bx.  Ok to fit into schedule Monday if at all possible.  She is obese.  I am concerned about endometrial cancer.  Thanks.  Vinnie Level

## 2019-08-14 NOTE — Telephone Encounter (Signed)
Spoke with patient. OV for EMB scheduled for today at 2:15pm. Covid 19 prescreen negative, precautions reviewed.   Patient reports spotting today, no heavy bleeding.   Advised to take Motrin 800 mg with food and water one hour before procedure. Patient verbalizes understanding and is agreeable.   Routing to provider for final review. Patient is agreeable to disposition. Will close encounter.  Cc: Hayley Carder

## 2019-08-14 NOTE — Telephone Encounter (Signed)
Call to patient. Patient reports she is "feeling better". Offered OV today at 2:15pm with Dr. Sabra Heck, arrive at 2pm. Patient states she has to review her schedule with her spouse and return call. Patient asking about starting progesterone, advised patient she can plan to further discuss at [place] today. Patient agreeable, will return call.   Order placed for EMB.

## 2019-08-14 NOTE — Telephone Encounter (Signed)
Spoke with patient, advised per Dr. Miller. Patient verbalizes understanding and is agreeable. Encounter closed.  

## 2019-08-14 NOTE — Telephone Encounter (Signed)
She doesn't need to stop the eliquis.  She may have some increased bleeding with the eliquis but we don't typically stop blood thinners for endometrial biopsy.

## 2019-08-14 NOTE — Telephone Encounter (Signed)
Patient returned call to office, she is scheduled for an OV with endometrial biopsy today at 2:15pm. Patient reports that she takes Eliquis 5 mg bid, has taken her dose today. Is there any concerns with EMB? Should this be stopped prior?   Advised patient I will have to review with Dr. Sabra Heck and f/u. Advised patient Dr. Sabra Heck is in the OR this morning, response may not be immediate, patient verbalizes understanding.   Dr. Sabra Heck -please review, ok to proceed as scheduled?

## 2019-08-14 NOTE — Progress Notes (Signed)
GYNECOLOGY  VISIT  CC:   PMP bleeding  HPI: 74 y.o. [id number] Married White or Caucasian female here for PMP bleeding that has been going on for about a week.  She started with some bleeding last Monday and it was light but bright red at times.  She also had some associated cramping.  She called, after hours, on Friday to report this.  Bleeding was heavier at that time.  She decided to be seen in the ER.  Endometrium on ultrasound was thickened to 7mm.  Hb was normal.  Pt reports bleeding has been light over the weekend.  She is aware endometrial sampling is recommended.  We discussed D&C vs endometrial biopsy.  I have recommended attempt at biopsy today.  Pt comfortable with plan.  GYNECOLOGIC HISTORY: Patient's last menstrual period was 01/22/2011. Contraception: btl Menopausal hormone therapy: none  Patient Active Problem List   Diagnosis Date Noted  . Acute ischemic stroke ([place]) 02/08/2019  . Acute CVA (cerebrovascular accident) ([place]) 02/07/2019  . Coagulopathy ([place]) 02/07/2019  . Anticoagulated 02/07/2019  . Visual changes 02/07/2019  . Leukocytosis 02/07/2019  . Hyperglycemia 02/07/2019  . Diabetes mellitus without complication ([place])   . Hypokalemia 03/19/2017  . HLD (hyperlipidemia) 03/19/2017  . Hematochezia   . Acute blood loss anemia   . Rectal bleeding 03/18/2017  . Acute GI bleeding   . 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 Stroke (cerebrum) ([place]) 01/18/2017  . Intracerebral hemorrhage 01/18/2017  . Elevated troponin 01/17/2017  . Atrial fibrillation ([place]) 10/27/2016  . Obesity 06/20/2015  . Nephrolithiasis 07/09/2014  . Asthma with bronchitis 02/23/2014  . Aphasia 02/27/2013  . ACUTE BRONCHITIS 08/30/2009  . Essential hypertension 02/22/2007  . Obstructive sleep apnea 02/22/2007  . ALLERGY 02/22/2007    Past Medical History:  Diagnosis Date  . Allergy   . Anxiety    xanax  . Arthritis    shoulders/hips - tx with OTC meds  . Breast cyst 03/1990    pt denies 12/05/13  . Bronchitis   . Cataract   . Cervical tab 1972  . 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 Diabetes mellitus without complication ([place])    stated not diabetic but her AIC has been high so she is monitored closely   . Endometrial polyp 04/2000  . Fall 01/2018   nose fracture   . Heart murmur   . Hemorrhoids   . Hypertension   . Kidney stones   . Obesity   . Osteopenia 05/2007  . Paroxysmal atrial fibrillation ([facility])   . Peripheral neuropathy   . PMB (postmenopausal bleeding) 04/2000  . SAB (spontaneous abortion) 1981  . Sleep apnea    CPAP  . Stroke ([place])   . SVD (spontaneous vaginal delivery) 04/1964, 05/1978   x3  . TIA (transient ischemic attack)     Past Surgical History:  Procedure Laterality Date  . CATARACT EXTRACTION Right 12/07/2018  . CERVICAL DISC SURGERY  2/93  . CESAREAN SECTION  6/83   with BTL  . COLONOSCOPY N/A 03/19/2017   Procedure: COLONOSCOPY;  Surgeon: Doran Stabler, MD;  Location: [facility];  Service: Gastroenterology;  Laterality: N/A;  . HYSTEROSCOPY  5/02, 01/2011  . LITHOTRIPSY  08/30/2014   with Dr. Jeffie Pollock at [facility]  . TOTAL KNEE ARTHROPLASTY Right 6/08   right  . TOTAL KNEE ARTHROPLASTY Left 4/10   left   . TUBAL LIGATION    . WRIST SURGERY Left 4/07   left    MEDS:  Current Outpatient Medications on File Prior to Visit  Medication Sig Dispense Refill  . acetaminophen (TYLENOL) 500 MG tablet Take 1,000 mg by mouth 2 (two) times daily as needed for headache.    . ALPRAZolam (XANAX) 0.5 MG tablet Take 0.5 mg by mouth as directed.    Marland Kitchen atenolol-chlorthalidone (TENORETIC) 50-25 MG tablet Take 1 tablet by mouth daily.    . cholecalciferol (VITAMIN D) 1000 UNITS tablet Take 1,000 Units by mouth every morning.     Marland Kitchen ELIQUIS 5 MG TABS tablet TAKE ONE TABLET BY MOUTH TWICE DAILY. 60 tablet 6  . metoprolol tartrate (LOPRESSOR) 25 MG tablet TAKE ONE TABLET BY MOUTH TWICE DAILY, MAY TAKE  EXTRA TABLET AS  NEEDED FOR PALPITATIONS. 270 tablet 3  . Multiple Vitamins-Minerals (MULTIVITAMIN WITH MINERALS) tablet Take 1 tablet by mouth every morning. Centrum Silver Womens 50+    . Polyethyl Glycol-Propyl Glycol (SYSTANE OP) Place 1 drop into both eyes daily as needed (dry eyes).     . potassium chloride SA (KLOR-CON) 20 MEQ tablet TAKE ONE TABLET BY MOUTH DAILY. 30 tablet 6  . PROAIR HFA 108 (90 Base) MCG/ACT inhaler INHALE TWO PUFFS BY MOUTH EVERY 6 HOURS AS NEEDED FOR WHEEZING AND SHORTNESS OF BREATH (Patient taking differently: Inhale 2 puffs into the lungs every 6 (six) hours as needed. ) 8.5 g 3  . silver sulfADIAZINE (SILVADENE) 1 % cream APPLY CREAM TO AFFECTED AREA ONCE A DAY AS DIRECTED    . sodium chloride (OCEAN) 0.65 % SOLN nasal spray Place 1 spray into both nostrils as needed for congestion.    Marland Kitchen losartan (COZAAR) 100 MG tablet Take 1 tablet (100 mg total) by mouth daily. 90 tablet 3   No current facility-administered medications on file prior to visit.    ALLERGIES: Bactrim [sulfamethoxazole-trimethoprim], Ciprofloxacin, Hydroxyzine hcl, Keflet [cephalexin], Lipitor [atorvastatin], Lisinopril, Spironolactone, Vistaril [hydroxyzine hcl], Erythromycin, Latex, Penicillins, Tape, and Tetracycline  Family History  Problem Relation Age of Onset  . Heart disease Father   . Colon cancer Mother   . Heart disease Mother   . Breast cancer Maternal Aunt   . Colon cancer Maternal Aunt   . Colon cancer Maternal Uncle   . Ehlers-Danlos syndrome Other        neice-being tested 4/15  . Stomach cancer Neg Hx   . Pancreatic cancer Neg Hx     SH:  Married, non smoker  Review of Systems  Constitutional: Negative.   HENT: Negative.   Eyes: Negative.   Respiratory: Negative.   Cardiovascular: Negative.   Gastrointestinal: Negative.   Endocrine: Negative.   Genitourinary: Positive for vaginal bleeding.  Musculoskeletal: Negative.   Skin: Negative.   Allergic/Immunologic: Negative.    Neurological: Negative.   Hematological: Negative.   Psychiatric/Behavioral: Negative.     PHYSICAL EXAMINATION:    BP 120/74   Pulse 70   Temp 97.7 F (36.5 C) (Skin)   Resp 16   Wt 257 lb (116.6 kg)   LMP 01/22/2011   BMI 51.91 kg/m     General appearance: alert, cooperative and appears stated age Lymph:  no inguinal LAD noted  Pelvic: External genitalia:  no lesions              Urethra:  normal appearing urethra with no masses, tenderness or lesions              Bartholins and Skenes: normal  Vagina: normal appearing vagina with normal color and discharge, no lesions              Cervix: no lesions and clot at os              Bimanual Exam:  Uterus:  normal size, contour, position, consistency, mobility, non-tender              Adnexa: no mass, fullness, tenderness  Endometrial biopsy recommended.  Discussed with patient.  Verbal and written consent obtained.   Procedure:  Speculum placed.  Cervix visualized and cleansed with betadine prep.  A single toothed tenaculum was applied to the anterior lip of the cervix.  Endometrial pipelle was advanced through the cervix into the endometrial cavity without difficulty.  Pipelle passed to 6 cm.  Suction applied and pipelle removed with good tissue sample obtained.  Tenculum removed.  No bleeding noted.  Patient tolerated procedure well.  Chaperone, Royal Hawthorn, CMA, was present for exam.  Assessment: PMP Bleeding Thickened endometrium on ultrasound Morbid obesity On eliquis  Plan: Biopsy obtained today.  Pt is aware she may have some increased bleeding due to biopsy.  Precautions given.  Pathology and recommendations will be called to her.

## 2019-08-16 ENCOUNTER — Telehealth: Payer: Self-pay

## 2019-08-16 LAB — SURGICAL PATHOLOGY

## 2019-08-16 NOTE — Telephone Encounter (Signed)
Patient is calling in regards to pain in lower abdomen. Patient stated she had a biopsy done yesterday (07/26/19).

## 2019-08-16 NOTE — Telephone Encounter (Signed)
AEX 05/16/2018 OV PMB 08/14/19 EMB done 08/14/19  Spoke with pt. Pt reports having constant lower abd pain/cramps that started after EMB on Monday  and worse today around 2:30pm. Pt called into our office at 3:30pm and called back from triage around 4pm.  Pt states took 1 Extra Strength Tylenol at 3pm today. Pt states cramping pain still not resolved. Pt states "worse pain in her life" rates at 10 on pain scale. Pt states became constant around 2:30pm Pt states vaginal bleeding less than on Monday at appt. States changing pad only 3 times today. Pt denies heavy bleeding, clots, fever, chills, vomiting or diarrhea or any changes in BM. Pt states has nausea. Pt advised to be seen for further evaluation. Pt states lives 1 hour away and office will be closed at this time. Pt advised to go to ER to be seen for sx described. Pt agreeable and states will go to [facility] since that's the closest ER to her in [place], [place]. Advised will review and update Dr Sabra Heck. Pt verbalized understanding and will return call with any follow up recommended. Pt agreeable.   Routing to Dr Sabra Heck for review and any additional recommendations.

## 2019-08-17 NOTE — Telephone Encounter (Signed)
Her pathology showed no abnormal cells but she needs a D&C with hysteroscopy.  We need cardiology clearance recommendations about Eliquis.  Pt is 74 with PMP bleeding now for about 10 days.  Please move along quickly with scheduling and cardiology clearance.

## 2019-08-17 NOTE — Telephone Encounter (Signed)
Spoke with pt. Pt given recommendations and results of EMB per Dr Sabra Heck. Pt agreeable and verbalized understanding of results and procedure. Pt made aware of phone calls from our office to schedule. Pt agreeable.   Will call Dr Harl Bowie office today for recommendations on Eliquis.

## 2019-08-17 NOTE — Telephone Encounter (Signed)
Pt calling this am to give update.  Pt states after triage call on 08/16/19, pt reports taking 1 Extra Strength Tylenol around 5pm and then passing large clot. Pt states felt much better and all pain went away. Pt states not having any bleeding or spotting since last night after passing clot.  Pt states did not go to ER due to having no pain or concerns after passing clot.  Advised will update Dr Sabra Heck and return call for recommendations. Pt agreeable.   Routing to Dr Sabra Heck.

## 2019-08-17 NOTE — Telephone Encounter (Signed)
Call placed to  [facility] and left message for callback.

## 2019-08-17 NOTE — Telephone Encounter (Signed)
Spoke with patient regarding surgery benefits. Patient acknowledges understanding of information presented. Patient is aware that benefits presented are for professional benefits only. Patient is aware that once surgery is scheduled, the hospital will call with separate benefits. Patient is aware of surgery cancellation policy. ° °Patient is ready to proceed with scheduling. °

## 2019-08-17 NOTE — Telephone Encounter (Signed)
Left message to call [place] at [phone].  Reviewed with Dr.Miller. Need to schedule surgery for 6/10 or 6/11 at [facility] in first come first serve time.

## 2019-08-18 NOTE — Telephone Encounter (Signed)
Call to patient. Left message on voice mail. Advised per Dr Sabra Heck, unable to proceed with surgery next week prior to travel due to need to review with cardiology before surgery. Left message to call back Tuesday for additional planning.

## 2019-08-18 NOTE — Telephone Encounter (Signed)
Spoke with patient. Advised of available surgery dates as seen below. Patient states she will be traveling to the beach for [place] on [place]03/19, asking to review options.   Advised patient Verline Lema is out of the office today, will forward to Lamont Snowball, RN to return call to further discuss. Patient agreeable.

## 2019-08-18 NOTE — Telephone Encounter (Signed)
Patient called again asking to talk with Gay Filler.

## 2019-08-18 NOTE — Telephone Encounter (Signed)
Patient is returning call.  °

## 2019-08-18 NOTE — Telephone Encounter (Signed)
Patient returned call

## 2019-08-18 NOTE — Telephone Encounter (Signed)
Call to patient. Traveling to [facility] via car on 09-03-19 for one week. Would not be swimming in ocean.  Discussed option of moving surgery up to 08-24-19. Patient is unsure if this date will work.  She may prefer to wait until after beach trip. Will check schedule and call back.

## 2019-08-18 NOTE — Telephone Encounter (Signed)
Spoke to patient approximately 4pm.  Advised will plan to schedule surgery following her trip. She returns 09-09-19.  Surgery date to follow required Covid screening.  Advised to call is heavy vaginal bleeding returns.  Also advised call has been placed to cardiologist so may hear from them with instructions.   Routing to [employer]  for scheduling.

## 2019-08-22 ENCOUNTER — Telehealth: Payer: Self-pay | Admitting: Cardiology

## 2019-08-22 ENCOUNTER — Telehealth: Payer: Self-pay | Admitting: *Deleted

## 2019-08-22 DIAGNOSIS — N950 Postmenopausal bleeding: Secondary | ICD-10-CM

## 2019-08-22 NOTE — Telephone Encounter (Signed)
Patient is still bleeding after biopsy.

## 2019-08-22 NOTE — Telephone Encounter (Signed)
   [facility] Pre-operative Risk Assessment    HEARTCARE STAFF: - Please ensure there is not already an duplicate clearance open for this procedure. - Under Visit Info/Reason for Call, type in Other and utilize the format Clearance MM/DD/YY or Clearance TBD. Do not use dashes or single digits. - If request is for dental extraction, please clarify the # of teeth to be extracted.  Request for surgical clearance:  1. What type of surgery is being performed? Hysteroscopy O& C Myosure due to PMB  2. When is this surgery scheduled? Doesn't state  3. What type of clearance is required (medical clearance vs. Pharmacy clearance to hold med vs. Both)? Pharmacy Clearance  4. Are there any medications that need to be held prior to surgery and how long? Eliquis-- how long does pt need to be off of this before surgery and when can she restart after surgery. Does Dr. Harl Bowie need to see pt before surgery?  5. Practice name and name of physician performing surgery? Dr. [facility]  6. What is the office phone number? [phone]   7.   What is the office fax number? [phone]  8.   Anesthesia type (None, local, MAC, general) ? Doesn't state    Desma Paganini 08/22/2019, 4:05 PM  _________________________________________________________________   (provider comments below)

## 2019-08-22 NOTE — Telephone Encounter (Signed)
Patient asked to speak with someone regarding scheduling her surgery.

## 2019-08-22 NOTE — Telephone Encounter (Signed)
Spoke with pt. Pt reports having heavy bleeding x 1 week. Same since EMB and OV on 08/14/19.  Pt states changing large maxi pad x 4-5 times a day that has small clots intermittently. Pt states having nausea intermittently within the last 15 days since started PMB. Pt denies feeling weak, light headed, dizzy, no fever, chills, vomiting, changes in BM or abd pain or cramps.  Pt wanting to have D&C with hysteroscopy  procedure as soon as possible to have bleeding taken care of. Pt given update on waiting for response from Dr Nelly Laurence office. Pt advised will return call with update soon. Pt agreeable.   Routing to Dr Sabra Heck for review and any additional recommendations.

## 2019-08-22 NOTE — Telephone Encounter (Signed)
Spoke with patient. Patient does not wish to wait until after her beach trip for surgery. Patient would like to proceed on 6/10 or 6/11. Per review of time at [facility] first come first serve time has been filled for those dates. Discussed earlier dates will be based on review with Cardiology. Advised will contact Dr.Branch's office and review with Dr.Miller and return call. Patient is agreeable.  Call to Dr.Branch's office. Questions must be faxed to [phone] for review. Questions faxed requesting STAT response.

## 2019-08-22 NOTE — Telephone Encounter (Signed)
Call to Dr.Branch's office. STAT pre op clearance note has been sent in Epic for review by Dr.Branch and cardiology clearance department at his office.

## 2019-08-22 NOTE — Telephone Encounter (Signed)
Spoke with patient. Advised surgery clearance has been sent for review via Dr.Branch's office as STAT. Advised we have had many challenges obtaining this information from Dr.Branch's office. Apologized for delay. Advised we are working on this STAT. Advised Dr.Miller will call Dr.Branch's office personally tomorrow if no answer in the morning. Advised patient if she develops any SOB, dizziness, weakness, fatigue, feeling light headed, or bleeding through a pad every hour for more than 2 hours will need to be seen overnight in ER immediately for evaluation. Advised will contact her first thing tomorrow to provide update with plan. Patient verbalizes understanding.  Dr.Branch's office number is  [phone].

## 2019-08-22 NOTE — Telephone Encounter (Signed)
   Primary Cardiologist:Branch, Roderic Palau, MD  Chart reviewed as part of pre-operative protocol coverage. Because of Nicole Cabrera's past medical history and time since last visit, they will require a follow-up visit in order to better assess preoperative cardiovascular risk.  Pre-op covering staff: - Please schedule appointment and call patient to inform them. If patient already had an upcoming appointment within acceptable timeframe, please add "pre-op clearance" to the appointment notes so provider is aware. - Please contact requesting surgeon's office via preferred method (i.e, phone, fax) to inform them of need for appointment prior to surgery.  If applicable, this message will also be routed to pharmacy pool and/or primary cardiologist for input on holding anticoagulant/antiplatelet agent as requested below so that this information is available to the clearing provider at time of patient's appointment.    Clinical pharmacist to address Eliquis, h/o PAF and CVA.  [place], [place]  08/22/2019, 7:00 PM

## 2019-08-22 NOTE — Telephone Encounter (Signed)
Call to Dr.Branch's office. Per office their fax machine has not been working correctly. Requesting this be refaxed. Fax resent to Dr.Branch's office with confirmation of receipt for 2nd time.   Call to Dr.Branch's office to confirm receipt of fax. There was no answer.

## 2019-08-23 ENCOUNTER — Other Ambulatory Visit: Payer: Self-pay | Admitting: Obstetrics & Gynecology

## 2019-08-23 MED ORDER — NORETHINDRONE ACETATE 5 MG PO TABS: 5.0000 mg | ORAL_TABLET | Freq: Every day | ORAL | 0 refills | Status: DC

## 2019-08-23 NOTE — Telephone Encounter (Signed)
Patient with diagnosis of atrial fibrillation on Eliquis for anticoagulation.    Procedure: Hysteroscopy D&C Myosure due to PMB Date of procedure: TBD  CHADS2-VASc score of  7 (HTN, AGE x 2, DM2, stroke/tia x 2, female)  CrCl 43.6 (with IBW) Platelet count 284  Due to patient history of stroke (Oct 2018) and prior mention in chart of TIA (2014), would recommend to hold Eliquis only 2 days prior and restart 1-2 days after procedure.  However will defer to MD for final decision based on her history.

## 2019-08-23 NOTE — Telephone Encounter (Signed)
Spoke with Dr.Branch's office. Appointment made for pre op clearance on 08/25/2019 at 10:45 am with Richardson Dopp, PA at [facility] location as this is the first available appointment. Spoke with patient who is agreeable to date, time, and location. Patient states that she is changing her pad every 4-5 hours. Passing quarter sized clots intermittently. Denies any light headedness, fatigue, dizziness, or pain. Advised will review with Dr.Miller and return call with further plans for surgery. Patient is agreeable.

## 2019-08-23 NOTE — Telephone Encounter (Signed)
Spoke with Verline Lema, RN. Orders received from Dr Sabra Heck to call pt about new Rx for Aygestin and to have STAT Hgb on 08/24/19.  Placed call to pt. Spoke with pt. Pt given recommendations per Dr Sabra Heck. Pt agreeable. Pt to pick up Rx Aygestin and will start tonight. Pt also scheduled for STAT Hgb on 6/3 at 945 am. Pt verbalized understanding and agreeable. Pt understands can take Eliquis and Aygestin together.   Routing to Dr Sabra Heck for review.  Encounter closed.  STAT Hgb orders placed.

## 2019-08-23 NOTE — Telephone Encounter (Signed)
Spoke with patient. Advised reviewed with Dr.Miller and we are tentatively planning for surgery on 08/30/2019. Advised will need to discuss with Dr.Miller further before scheduling. Advised Dr.Miller is off today and will be in touch this afternoon. Advised per pharmacy with Cardiology will need to be off Eliquis for 2 days prior to surgery and can restart 1-2 days after. Advised this was sent to Dr.Branch to ensure he agrees. Patient verbalizes understanding.

## 2019-08-23 NOTE — Telephone Encounter (Signed)
Pt has appointment to see Richardson Dopp, PA-C on 08/25/19

## 2019-08-23 NOTE — Telephone Encounter (Signed)
I agree with your recommendations about holding eliquis 2 days prior, resuming 1 day after procedure   Zandra Abts MD

## 2019-08-24 ENCOUNTER — Other Ambulatory Visit (INDEPENDENT_AMBULATORY_CARE_PROVIDER_SITE_OTHER): Payer: BC Managed Care – PPO

## 2019-08-24 ENCOUNTER — Other Ambulatory Visit: Payer: Self-pay

## 2019-08-24 ENCOUNTER — Telehealth: Payer: Self-pay

## 2019-08-24 DIAGNOSIS — N950 Postmenopausal bleeding: Secondary | ICD-10-CM

## 2019-08-24 LAB — HEMOGLOBIN: Hemoglobin: 12.1 g/dL (ref 11.1–15.9)

## 2019-08-24 NOTE — Telephone Encounter (Signed)
-----   Message from Megan Salon, MD sent at 08/24/2019 11:45 AM EDT ----- Please let pt know her hb is still normal.  Was 13.6 about two weeks ago and has decreased to 12.1 but still normal.  Did she start the aygestin and did her bleeding get any better?  Thanks.

## 2019-08-24 NOTE — Progress Notes (Addendum)
Cardiology Office Note:    Date:  08/25/2019   ID:  MYRTLENE SERVATIUS, DOB 07/28/1945, MRN [id number]  PCP:  Leeanne Rio, MD  Cardiologist:  Carlyle Dolly, MD  Electrophysiologist:  None   Referring MD: Leeanne Rio, MD   Chief Complaint:  Surgical clearance    Patient Profile:    Nicole Cabrera is a 74 y.o. female with:   Paroxysmal Atrial fibrillation   Coronary artery disease   Ca score 7, mild diff non-obs dz by CT in 2018  Hyperlipidemia   Intol of Atorvastatin  Declined trying other statins   OSA  Hypertension   Declines extensive med changes   Hx of recurrent CVA  Pulmonary nodules   Prior CV studies: Echocardiogram 02/08/2019 EF 60-65, mod LVH, Gr 2 DD, normal RVSF, severe LAE, mild RAE, mild MS (peak 6.9), mild PI, RVSP 40.5  Carotid US 02/08/2019 Unremarkable [place]  Coronary CTA 01/02/17 RCA mild plaque LAD prox 25-50 LCx mild plaque  Ca score 7 (40th percentile)   History of Present Illness:    Nicole Cabrera was last seen by Dr. Harl Bowie in [place] in 11/2018.  She was admitted in 01/2019 with a CVA (MRI with acute infarct in R medial cerebellum and post limb of internal capsule on the L).  The patient left the hospital AMA.  She is scheduled in the [facility] office today for surgical clearance.  She needs a hysteroscopy due to dysfunctional uterine bleeding.  She is able to tell when she is in atrial fibrillation.  She felt like she had gone into atrial fibrillation a couple of days ago and was back in sinus rhythm today.  She has not really had rapid heartbeats.  She has not had chest pain, shortness of breath, syncope, orthopnea.  She has some mild pedal edema without significant change.  She had some hip issues recently and was seen by an orthopedist.  She had a hematoma noted.  Since then, she has tried to limit her activity.  Prior to this, she was able to do most activities without limitation.  Past Medical History:  Diagnosis Date    . Allergy   . Anxiety    xanax  . Arthritis    shoulders/hips - tx with OTC meds  . Breast cyst 03/1990   pt denies 12/05/13  . Bronchitis   . Cataract   . Cervical tab 1972  . 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 Diabetes mellitus without complication ([place])    stated not diabetic but her AIC has been high so she is monitored closely   . Endometrial polyp 04/2000  . Fall 01/2018   nose fracture   . Heart murmur   . Hemorrhoids   . Hypertension   . Kidney stones   . Obesity   . Osteopenia 05/2007  . Paroxysmal atrial fibrillation (H[facility])   . Peripheral neuropathy   . PMB (postmenopausal bleeding) 04/2000  . SAB (spontaneous abortion) 1981  . Sleep apnea    CPAP  . Stroke ([place])   . SVD (spontaneous vaginal delivery) 04/1964, 05/1978   x3  . TIA (transient ischemic attack)     Current Medications: Current Meds  Medication Sig  . acetaminophen (TYLENOL) 500 MG tablet Take 1,000 mg by mouth 2 (two) times daily as needed for headache.  . ALPRAZolam (XANAX) 0.5 MG tablet Take 0.5 mg by mouth as directed.  . chlorthalidone (HYGROTON) 25 MG tablet Take 25 mg by mouth daily.  . cholecalciferol (  VITAMIN D) 1000 UNITS tablet Take 1,000 Units by mouth every morning.   Marland Kitchen ELIQUIS 5 MG TABS tablet TAKE ONE TABLET BY MOUTH TWICE DAILY.  . metoprolol tartrate (LOPRESSOR) 25 MG tablet TAKE ONE TABLET BY MOUTH TWICE DAILY, MAY TAKE  EXTRA TABLET AS NEEDED FOR PALPITATIONS.  . Multiple Vitamins-Minerals (MULTIVITAMIN WITH MINERALS) tablet Take 1 tablet by mouth every morning. Centrum Silver Womens 50+  . norethindrone (AYGESTIN) 5 MG tablet Take 1 tablet (5 mg total) by mouth daily.  Vladimir Faster Glycol-Propyl Glycol (SYSTANE OP) Place 1 drop into both eyes daily as needed (dry eyes).   . potassium chloride SA (KLOR-CON) 20 MEQ tablet TAKE ONE TABLET BY MOUTH DAILY.  Marland Kitchen PROAIR HFA 108 (90 Base) MCG/ACT inhaler INHALE TWO PUFFS BY MOUTH EVERY 6 HOURS AS NEEDED FOR WHEEZING AND  SHORTNESS OF BREATH (Patient taking differently: Inhale 2 puffs into the lungs every 6 (six) hours as needed. )  . silver sulfADIAZINE (SILVADENE) 1 % cream APPLY CREAM TO AFFECTED AREA ONCE A DAY AS DIRECTED  . sodium chloride (OCEAN) 0.65 % SOLN nasal spray Place 1 spray into both nostrils as needed for congestion.     Allergies:   Bactrim [sulfamethoxazole-trimethoprim], Ciprofloxacin, Hydroxyzine hcl, Keflet [cephalexin], Lipitor [atorvastatin], Lisinopril, Spironolactone, Vistaril [hydroxyzine hcl], Erythromycin, Latex, Penicillins, Tape, and Tetracycline   Social History   Tobacco Use  . Smoking status: Never Smoker  . Smokeless tobacco: Never Used  Substance Use Topics  . Alcohol use: No    Alcohol/week: 0.0 - 1.0 standard drinks  . Drug use: No     Family Hx: The patient's family history includes Breast cancer in her maternal aunt; Colon cancer in her maternal aunt, maternal uncle, and mother; Ehlers-Danlos syndrome in an other family member; Heart disease in her father and mother. There is no history of Stomach cancer or Pancreatic cancer.  ROS   EKGs/Labs/Other Test Reviewed:    EKG:  EKG is  ordered today.  The ekg ordered today demonstrates atrial fibrillation, HR 77, normal axis, PVC, low voltage, QTC 427  Recent Labs: 02/01/2019: Magnesium 2.0 08/11/2019: ALT 37; BUN 13; Creatinine, Ser 0.80; Platelets 284; Potassium 4.0; Sodium 136 08/24/2019: Hemoglobin 12.1   Recent Lipid Panel Lab Results  Component Value Date/Time   CHOL 192 02/07/2019 10:56 AM   TRIG 119 02/07/2019 10:56 AM   HDL 51 02/07/2019 10:56 AM   CHOLHDL 3.8 02/07/2019 10:56 AM   LDLCALC 117 (H) 02/07/2019 10:56 AM    Physical Exam:    VS:  BP 116/64   Pulse 77   Ht 4' 11.5" (1.511 m)   Wt 259 lb 6.4 oz (117.7 kg)   LMP 01/22/2011   BMI 51.52 kg/m     Wt Readings from Last 3 Encounters:  08/25/19 259 lb 6.4 oz (117.7 kg)  08/14/19 257 lb (116.6 kg)  08/11/19 264 lb 8.8 oz (120 kg)      Constitutional:      Appearance: Healthy appearance. Not in distress.  Pulmonary:     Breath sounds: No wheezing. No rales.  Cardiovascular:     Normal rate. Irregularly irregular rhythm. Normal S1. Normal S2.     Murmurs: There is no murmur.  Edema:    Peripheral edema absent.  Abdominal:     Palpations: Abdomen is soft.  Musculoskeletal:     Cervical back: Neck supple. Skin:    General: Skin is warm and dry.  Neurological:     General: No focal deficit present.  Mental Status: Alert and oriented to person, place and time.      ASSESSMENT & PLAN:    1. Preoperative cardiovascular examination   Nicole Cabrera perioperative risk of a major cardiac event is 0.9% according to the Revised Cardiac Risk Index (RCRI).  Therefore, she is at low risk for perioperative complications.   Her functional capacity is fair at 4.31 METs according to the Duke Activity Status Index (DASI). Recommendations: According to ACC/AHA guidelines, no further cardiovascular testing needed.  The patient may proceed to surgery at acceptable risk.   Antiplatelet and/or Anticoagulation Recommendations: Eliquis (Apixaban) can be held for 2 days prior to surgery and resumed 1 day after surgery.  2. Paroxysmal atrial fibrillation ([place]) She is back in atrial fibrillation today.  Her heart rate is well controlled.  She was symptomatic for couple of days but is no longer having symptoms.  She has a follow-up with Dr. Harl Bowie in August.  She will keep that appointment.  I have encouraged her to call for earlier follow-up if she is having more symptoms or she feels her heart rate is elevated.  Continue Apixaban, metoprolol tartrate.  3. Coronary artery disease involving native coronary artery of native heart without angina pectoris Mild nonobstructive disease by coronary CTA in 2018.  She is not having anginal symptoms.  She is not on aspirin as she is on Apixaban.  She is intolerant of statins.    Dispo:  Return in 2  months (on 10/26/2019) for Scheduled Follow Up with Dr. Harl Bowie.   Medication Adjustments/Labs and Tests Ordered: Current medicines are reviewed at length with the patient today.  Concerns regarding medicines are outlined above.  Tests Ordered: Orders Placed This Encounter  Procedures  . EKG 12-Lead   Medication Changes: No orders of the defined types were placed in this encounter.   Signed, Richardson Dopp, PA-C  08/25/2019 1:52 PM    [facility] [address], [place], [place]  [address] Phone: [phone]; Fax: [phone]

## 2019-08-24 NOTE — Telephone Encounter (Signed)
Left message to call Cassidy Tabet at [phone]. 

## 2019-08-25 ENCOUNTER — Ambulatory Visit (INDEPENDENT_AMBULATORY_CARE_PROVIDER_SITE_OTHER): Payer: BC Managed Care – PPO | Admitting: Physician Assistant

## 2019-08-25 ENCOUNTER — Encounter: Payer: Self-pay | Admitting: Physician Assistant

## 2019-08-25 VITALS — BP 116/64 | HR 77 | Ht 59.5 in | Wt 259.4 lb

## 2019-08-25 DIAGNOSIS — Z01810 Encounter for preprocedural cardiovascular examination: Secondary | ICD-10-CM

## 2019-08-25 DIAGNOSIS — I480 Paroxysmal atrial fibrillation: Secondary | ICD-10-CM | POA: Diagnosis not present

## 2019-08-25 DIAGNOSIS — I2510 Atherosclerotic heart disease of native coronary artery without angina pectoris: Secondary | ICD-10-CM | POA: Diagnosis not present

## 2019-08-25 NOTE — Telephone Encounter (Signed)
Spoke with patient. Advised of message as seen below from [place]. Patient verbalizes understanding. Surgery is scheduled for 08/30/2019 at 10:15 am at [facility]. COVID test is scheduled for 08/28/2019 at 2:15 pm at [facility] location. Patient is aware of the need to quarantine after test until surgery. 2 week post op scheduled for 09/14/2019 at 3:30 pm with Dr.Miller. Patient states that she has not started Aygestin as she has been having episodes of A fib and was worried about taking it. Reports her bleeding has decreased and she is very lightly bleeding/spotting. Aware if her bleeding increases it is recommended that she start the Aygestin 5 mg daily. Bleeding precautions given. Patient verbalizes understanding. Patient is aware of the need to stop her Eliquis per Cardiology 2 days prior to surgery. Surgery instructions reviewed. Patient verbalizes understanding. Surgery information mailed to patient at verified home address on file.  Routing to provider and will close encounter.

## 2019-08-25 NOTE — Telephone Encounter (Signed)
Left message to call Zakari Bathe at [phone]. 

## 2019-08-25 NOTE — Patient Instructions (Signed)
Medication Instructions:  Your physician recommends that you continue on your current medications as directed. Please refer to the Current Medication list given to you today.  *If you need a refill on your cardiac medications before your next appointment, please call your pharmacy*   Lab Work: None  If you have labs (blood work) drawn today and your tests are completely normal, you will receive your results only by: Marland Kitchen MyChart Message (if you have MyChart) OR . A paper copy in the mail If you have any lab test that is abnormal or we need to change your treatment, we will call you to review the results.   Testing/Procedures: None   Follow-Up: At [facility], you and your health needs are our priority.  As part of our continuing mission to provide you with exceptional heart care, we have created designated Provider Care Teams.  These Care Teams include your primary Cardiologist (physician) and Advanced Practice Providers (APPs -  Physician Assistants and Nurse Practitioners) who all work together to provide you with the care you need, when you need it.  We recommend signing up for the patient portal called "MyChart".  Sign up information is provided on this After Visit Summary.  MyChart is used to connect with patients for Virtual Visits (Telemedicine).  Patients are able to view lab/test results, encounter notes, upcoming appointments, etc.  Non-urgent messages can be sent to your provider as well.   To learn more about what you can do with MyChart, go to [email or web address].    Your next appointment:   Keep follow up as scheduled with Dr Harl Bowie

## 2019-08-28 ENCOUNTER — Other Ambulatory Visit: Payer: Self-pay | Admitting: Obstetrics & Gynecology

## 2019-08-28 ENCOUNTER — Other Ambulatory Visit (HOSPITAL_COMMUNITY)
Admission: RE | Admit: 2019-08-28 | Discharge: 2019-08-28 | Disposition: A | Discharge status: Home / Self Care | Payer: BC Managed Care – PPO | Source: Ambulatory Visit | Attending: Obstetrics & Gynecology | Admitting: Obstetrics & Gynecology

## 2019-08-28 DIAGNOSIS — Z01812 Encounter for preprocedural laboratory examination: Secondary | ICD-10-CM | POA: Insufficient documentation

## 2019-08-28 DIAGNOSIS — Z20822 Contact with and (suspected) exposure to covid-19: Secondary | ICD-10-CM | POA: Insufficient documentation

## 2019-08-28 LAB — SARS CORONAVIRUS 2 (TAT 6-24 HRS): SARS Coronavirus 2: NEGATIVE

## 2019-08-28 NOTE — Progress Notes (Signed)
[facility] [place], [address] [place] [address] Phone: [phone] Fax: [phone]  [facility] [place], [place] [address] Phone: [phone] Fax: [phone]      Your procedure is scheduled on Wednesday, June 9th.  Report to [facility] Main Entrance "A" at 8:20 A.M., and check in at the Admitting office.  Call this number if you have problems the morning of surgery:  [phone]  Call [phone] if you have any questions prior to your surgery date Monday-Friday 8am-4pm    Remember:  Do not eat after midnight the night before your surgery  You may drink clear liquids until 7:20 AM the morning of your surgery.   Clear liquids allowed are: Water, Non-Citrus Juices (without pulp), Carbonated Beverages, Clear Tea, Black Coffee Only, and Gatorade    Take these medicines the morning of surgery with A SIP OF WATER   Tylenol - if needed  Alprazolam (Xanax) - if needed  Mucinex - if needed  Metoprolol  Proair Inhaler - if needed (bring with you on day of surgery)  Follow your surgeon's instructions on when to stop Eliquis.  If no instructions were given by your surgeon then you will need to call the office to get those instructions.    As of today, STOP taking any Aspirin (unless otherwise instructed by your surgeon) and Aspirin containing products, Aleve, Naproxen, Ibuprofen, Motrin, Advil, Goody's, BC's, all herbal medications, fish oil, and all vitamins.    HOW TO MANAGE YOUR DIABETES BEFORE AND AFTER SURGERY  Why is it important to control my blood sugar before and after surgery? . Improving blood sugar levels before and after surgery helps healing and can limit problems. . A way of improving blood sugar control is eating a healthy diet by: o  Eating less sugar and carbohydrates o  Increasing activity/exercise o  Talking with your doctor about reaching  your blood sugar goals . High blood sugars (greater than 180 mg/dL) can raise your risk of infections and slow your recovery, so you will need to focus on controlling your diabetes during the weeks before surgery. . Make sure that the doctor who takes care of your diabetes knows about your planned surgery including the date and location.  How do I manage my blood sugar before surgery? . Check your blood sugar at least 4 times a day, starting 2 days before surgery, to make sure that the level is not too high or low. . Check your blood sugar the morning of your surgery when you wake up and every 2 hours until you get to the Short Stay unit. o If your blood sugar is less than 70 mg/dL, you will need to treat for low blood sugar: - Do not take insulin. - Treat a low blood sugar (less than 70 mg/dL) with  cup of clear juice (cranberry or apple), 4 glucose tablets, OR glucose gel. - Recheck blood sugar in 15 minutes after treatment (to make sure it is greater than 70 mg/dL). If your blood sugar is not greater than 70 mg/dL on recheck, call [phone] for further instructions. . Report your blood sugar to the short stay nurse when you get to Short Stay.  . If you are admitted to the hospital after surgery: o Your blood sugar will be checked by the staff and you will probably be given insulin after surgery (instead of oral  diabetes medicines) to make sure you have good blood sugar levels. o The goal for blood sugar control after surgery is 80-180 mg/dL.                On the day of surgery:                   Do not wear jewelry, make up, or nail polish            Do not wear lotions, powders, perfumes, or deodorant.            Do not shave 48 hours prior to surgery.              Do not bring valuables to the hospital.            [facility] is not responsible for any belongings or valuables.  Do NOT Smoke (Tobacco/Vaping) or drink Alcohol 24 hours prior to your procedure If you use a CPAP at  night, you may bring all equipment for your overnight stay.   Contacts, glasses, dentures or bridgework may not be worn into surgery.      For patients admitted to the hospital, discharge time will be determined by your treatment team.   Patients discharged the day of surgery will not be allowed to drive home, and someone needs to stay with them for 24 hours.    Special instructions:   [facility]- Preparing For Surgery  Before surgery, you can play an important role. Because skin is not sterile, your skin needs to be as free of germs as possible. You can reduce the number of germs on your skin by washing with CHG (chlorahexidine gluconate) Soap before surgery.  CHG is an antiseptic cleaner which kills germs and bonds with the skin to continue killing germs even after washing.    Oral Hygiene is also important to reduce your risk of infection.  Remember - BRUSH YOUR TEETH THE MORNING OF SURGERY WITH YOUR REGULAR TOOTHPASTE  Please do not use if you have an allergy to CHG or antibacterial soaps. If your skin becomes reddened/irritated stop using the CHG.  Do not shave (including legs and underarms) for at least 48 hours prior to first CHG shower. It is OK to shave your face.  Please follow these instructions carefully.   1. Shower the NIGHT BEFORE SURGERY and the MORNING OF SURGERY with CHG Soap.   2. If you chose to wash your hair, wash your hair first as usual with your normal shampoo.  3. After you shampoo, rinse your hair and body thoroughly to remove the shampoo.  4. Use CHG as you would any other liquid soap. You can apply CHG directly to the skin and wash gently with a scrungie or a clean washcloth.   5. Apply the CHG Soap to your body ONLY FROM THE NECK DOWN.  Do not use on open wounds or open sores. Avoid contact with your eyes, ears, mouth and genitals (private parts). Wash Face and genitals (private parts)  with your normal soap.   6. Wash thoroughly, paying special  attention to the area where your surgery will be performed.  7. Thoroughly rinse your body with warm water from the neck down.  8. DO NOT shower/wash with your normal soap after using and rinsing off the CHG Soap.  9. Pat yourself dry with a CLEAN TOWEL.  10. Wear CLEAN PAJAMAS to bed the night before surgery, wear comfortable clothes the morning of surgery  11. Place CLEAN SHEETS on your bed the night of your first shower and DO NOT SLEEP WITH PETS.   Day of Surgery:   Do not apply any deodorants/lotions.  Please wear clean clothes to the hospital/surgery center.   Remember to brush your teeth WITH YOUR REGULAR TOOTHPASTE.   Please read over the following fact sheets that you were given.

## 2019-08-29 ENCOUNTER — Encounter (HOSPITAL_COMMUNITY)
Admission: RE | Admit: 2019-08-29 | Discharge: 2019-08-29 | Disposition: A | Discharge status: Home / Self Care | Payer: BC Managed Care – PPO | Source: Ambulatory Visit | Attending: Obstetrics & Gynecology | Admitting: Obstetrics & Gynecology

## 2019-08-29 ENCOUNTER — Other Ambulatory Visit: Payer: Self-pay

## 2019-08-29 ENCOUNTER — Encounter (HOSPITAL_COMMUNITY): Payer: Self-pay

## 2019-08-29 DIAGNOSIS — I10 Essential (primary) hypertension: Secondary | ICD-10-CM | POA: Insufficient documentation

## 2019-08-29 DIAGNOSIS — Z01812 Encounter for preprocedural laboratory examination: Secondary | ICD-10-CM | POA: Diagnosis not present

## 2019-08-29 HISTORY — DX: Dyspnea, unspecified: R06.00

## 2019-08-29 HISTORY — DX: Cardiac arrhythmia, unspecified: I49.9

## 2019-08-29 LAB — CBC
HCT: 36.8 % (ref 36.0–46.0)
Hemoglobin: 11.7 g/dL — ABNORMAL LOW (ref 12.0–15.0)
MCH: 29 pg (ref 26.0–34.0)
MCHC: 31.8 g/dL (ref 30.0–36.0)
MCV: 91.3 fL (ref 80.0–100.0)
Platelets: 288 10*3/uL (ref 150–400)
RBC: 4.03 MIL/uL (ref 3.87–5.11)
RDW: 14.9 % (ref 11.5–15.5)
WBC: 11.1 10*3/uL — ABNORMAL HIGH (ref 4.0–10.5)
nRBC: 0 % (ref 0.0–0.2)

## 2019-08-29 LAB — BASIC METABOLIC PANEL
Anion gap: 9 (ref 5–15)
BUN: 10 mg/dL (ref 8–23)
CO2: 26 mmol/L (ref 22–32)
Calcium: 8.9 mg/dL (ref 8.9–10.3)
Chloride: 102 mmol/L (ref 98–111)
Creatinine, Ser: 0.68 mg/dL (ref 0.44–1.00)
GFR calc Af Amer: >60 mL/min (ref >60)
GFR calc non Af Amer: >60 mL/min (ref >60)
Glucose, Bld: 121 mg/dL — ABNORMAL HIGH (ref 70–99)
Potassium: 3.6 mmol/L (ref 3.5–5.1)
Sodium: 137 mmol/L (ref 135–145)

## 2019-08-29 LAB — HEMOGLOBIN A1C
Hgb A1c MFr Bld: 6.2 % — ABNORMAL HIGH (ref 4.8–5.6)
Mean Plasma Glucose: 131.24 mg/dL

## 2019-08-29 NOTE — Progress Notes (Signed)
PCP - DR Huel Cote Cardiologist - DR [facility]    -    Chest x-ray - 9/20 EKG - 6/21 Stress Test - NA ECHO - 11/20    Cardiac Cath - NA Sleep Study - YES CPAP - YES  Fasting Blood Sugar - ? Checks Blood Sugar ___NO__ times a day  Blood Thinner Instructions:ELIQUIS  STOPPED 6/7 Aspirin Instructions:STOP ERAS Protcol INSTRUCTED PRE-SURGERY Ensure or G2-  NO DRINK  COVID TEST-  DONE 6/7 Anesthesia review: HRT HX   EKG  Patient denies shortness of breath, fever, cough and chest pain at PAT appointment   All instructions explained to the patient, with a verbal understanding of the material. Patient agrees to go over the instructions while at home for a better understanding. Patient also instructed to self quarantine after being tested for COVID-19. The opportunity to ask questions was provided.

## 2019-08-30 ENCOUNTER — Ambulatory Visit (HOSPITAL_COMMUNITY): Payer: BC Managed Care – PPO | Admitting: Physician Assistant

## 2019-08-30 ENCOUNTER — Ambulatory Visit (HOSPITAL_COMMUNITY): Payer: BC Managed Care – PPO | Admitting: Certified Registered Nurse Anesthetist

## 2019-08-30 ENCOUNTER — Encounter (HOSPITAL_COMMUNITY)
Admission: RE | Disposition: A | Discharge status: Home / Self Care | Payer: Self-pay | Source: Ambulatory Visit | Attending: Obstetrics & Gynecology

## 2019-08-30 ENCOUNTER — Ambulatory Visit (HOSPITAL_COMMUNITY)
Admission: RE | Admit: 2019-08-30 | Discharge: 2019-08-30 | Disposition: A | Discharge status: Home / Self Care | Payer: BC Managed Care – PPO | Source: Ambulatory Visit | Attending: Obstetrics & Gynecology | Admitting: Obstetrics & Gynecology

## 2019-08-30 ENCOUNTER — Encounter (HOSPITAL_COMMUNITY): Payer: Self-pay | Admitting: Obstetrics & Gynecology

## 2019-08-30 DIAGNOSIS — E1136 Type 2 diabetes mellitus with diabetic cataract: Secondary | ICD-10-CM | POA: Diagnosis not present

## 2019-08-30 DIAGNOSIS — Z79899 Other long term (current) drug therapy: Secondary | ICD-10-CM | POA: Diagnosis not present

## 2019-08-30 DIAGNOSIS — M1990 Unspecified osteoarthritis, unspecified site: Secondary | ICD-10-CM | POA: Diagnosis not present

## 2019-08-30 DIAGNOSIS — Z7901 Long term (current) use of anticoagulants: Secondary | ICD-10-CM | POA: Insufficient documentation

## 2019-08-30 DIAGNOSIS — F419 Anxiety disorder, unspecified: Secondary | ICD-10-CM | POA: Insufficient documentation

## 2019-08-30 DIAGNOSIS — Z8673 Personal history of transient ischemic attack (TIA), and cerebral infarction without residual deficits: Secondary | ICD-10-CM | POA: Diagnosis not present

## 2019-08-30 DIAGNOSIS — I480 Paroxysmal atrial fibrillation: Secondary | ICD-10-CM | POA: Insufficient documentation

## 2019-08-30 DIAGNOSIS — E1142 Type 2 diabetes mellitus with diabetic polyneuropathy: Secondary | ICD-10-CM | POA: Diagnosis not present

## 2019-08-30 DIAGNOSIS — Z888 Allergy status to other drugs, medicaments and biological substances status: Secondary | ICD-10-CM | POA: Diagnosis not present

## 2019-08-30 DIAGNOSIS — E669 Obesity, unspecified: Secondary | ICD-10-CM | POA: Diagnosis not present

## 2019-08-30 DIAGNOSIS — Z96653 Presence of artificial knee joint, bilateral: Secondary | ICD-10-CM | POA: Diagnosis not present

## 2019-08-30 DIAGNOSIS — N8501 Benign endometrial hyperplasia: Secondary | ICD-10-CM | POA: Insufficient documentation

## 2019-08-30 DIAGNOSIS — G4730 Sleep apnea, unspecified: Secondary | ICD-10-CM | POA: Insufficient documentation

## 2019-08-30 DIAGNOSIS — E785 Hyperlipidemia, unspecified: Secondary | ICD-10-CM | POA: Diagnosis not present

## 2019-08-30 DIAGNOSIS — I10 Essential (primary) hypertension: Secondary | ICD-10-CM | POA: Insufficient documentation

## 2019-08-30 DIAGNOSIS — Z881 Allergy status to other antibiotic agents status: Secondary | ICD-10-CM | POA: Insufficient documentation

## 2019-08-30 DIAGNOSIS — I4891 Unspecified atrial fibrillation: Secondary | ICD-10-CM | POA: Diagnosis not present

## 2019-08-30 DIAGNOSIS — Z880 Allergy status to penicillin: Secondary | ICD-10-CM | POA: Insufficient documentation

## 2019-08-30 DIAGNOSIS — N8500 Endometrial hyperplasia, unspecified: Secondary | ICD-10-CM | POA: Diagnosis not present

## 2019-08-30 DIAGNOSIS — N950 Postmenopausal bleeding: Secondary | ICD-10-CM | POA: Diagnosis not present

## 2019-08-30 LAB — PROTIME-INR
INR: 1.1 (ref 0.8–1.2)
Prothrombin Time: 13.5 seconds (ref 11.4–15.2)

## 2019-08-30 LAB — GLUCOSE, CAPILLARY
Glucose-Capillary: 116 mg/dL — ABNORMAL HIGH (ref 70–99)
Glucose-Capillary: 118 mg/dL — ABNORMAL HIGH (ref 70–99)

## 2019-08-30 SURGERY — EXAM UNDER ANESTHESIA
Anesthesia: General | Site: Vagina

## 2019-08-30 MED ORDER — PROPOFOL 10 MG/ML IV BOLUS
INTRAVENOUS | Status: AC
  Filled 2019-08-30: qty 20

## 2019-08-30 MED ORDER — ONDANSETRON HCL 4 MG/2ML IJ SOLN
INTRAMUSCULAR | Status: DC | PRN
  Administered 2019-08-30: 4 mg via INTRAVENOUS

## 2019-08-30 MED ORDER — PHENYLEPHRINE HCL-NACL 10-0.9 MG/250ML-% IV SOLN
INTRAVENOUS | Status: DC | PRN
  Administered 2019-08-30: 20 ug/min via INTRAVENOUS

## 2019-08-30 MED ORDER — FENTANYL CITRATE (PF) 100 MCG/2ML IJ SOLN: 25.0000 ug | INTRAMUSCULAR | Status: DC | PRN

## 2019-08-30 MED ORDER — FENTANYL CITRATE (PF) 250 MCG/5ML IJ SOLN
INTRAMUSCULAR | Status: AC
  Filled 2019-08-30: qty 5

## 2019-08-30 MED ORDER — LACTATED RINGERS IV SOLN: INTRAVENOUS | Status: DC

## 2019-08-30 MED ORDER — ONDANSETRON HCL 4 MG/2ML IJ SOLN: 4.0000 mg | Freq: Once | INTRAMUSCULAR | Status: DC | PRN

## 2019-08-30 MED ORDER — LIDOCAINE 2% (20 MG/ML) 5 ML SYRINGE
INTRAMUSCULAR | Status: AC
  Filled 2019-08-30: qty 5

## 2019-08-30 MED ORDER — LIDOCAINE-EPINEPHRINE 1 %-1:100000 IJ SOLN
INTRAMUSCULAR | Status: AC
  Filled 2019-08-30: qty 1

## 2019-08-30 MED ORDER — ONDANSETRON HCL 4 MG/2ML IJ SOLN
INTRAMUSCULAR | Status: AC
  Filled 2019-08-30: qty 2

## 2019-08-30 MED ORDER — PROPOFOL 10 MG/ML IV BOLUS
INTRAVENOUS | Status: DC | PRN
  Administered 2019-08-30: 50 mg via INTRAVENOUS
  Administered 2019-08-30: 150 mg via INTRAVENOUS

## 2019-08-30 MED ORDER — OXYCODONE HCL 5 MG/5ML PO SOLN: 5.0000 mg | Freq: Once | ORAL | Status: DC | PRN

## 2019-08-30 MED ORDER — LIDOCAINE HCL (CARDIAC) PF 100 MG/5ML IV SOSY
PREFILLED_SYRINGE | INTRAVENOUS | Status: DC | PRN
  Administered 2019-08-30: 100 mg via INTRAVENOUS

## 2019-08-30 MED ORDER — ORAL CARE MOUTH RINSE: 15.0000 mL | Freq: Once | OROMUCOSAL | Status: AC

## 2019-08-30 MED ORDER — FENTANYL CITRATE (PF) 100 MCG/2ML IJ SOLN
INTRAMUSCULAR | Status: DC | PRN
  Administered 2019-08-30: 50 ug via INTRAVENOUS

## 2019-08-30 MED ORDER — LIDOCAINE HCL 1 % IJ SOLN
INTRAMUSCULAR | Status: AC
  Filled 2019-08-30: qty 20

## 2019-08-30 MED ORDER — PHENYLEPHRINE HCL (PRESSORS) 10 MG/ML IV SOLN
INTRAVENOUS | Status: DC | PRN
  Administered 2019-08-30 (×2): 120 ug via INTRAVENOUS

## 2019-08-30 MED ORDER — DEXAMETHASONE SODIUM PHOSPHATE 10 MG/ML IJ SOLN
INTRAMUSCULAR | Status: DC | PRN
  Administered 2019-08-30: 10 mg via INTRAVENOUS

## 2019-08-30 MED ORDER — OXYCODONE HCL 5 MG PO TABS: 5.0000 mg | ORAL_TABLET | Freq: Once | ORAL | Status: DC | PRN

## 2019-08-30 MED ORDER — SUCCINYLCHOLINE CHLORIDE 20 MG/ML IJ SOLN
INTRAMUSCULAR | Status: DC | PRN
  Administered 2019-08-30: 140 mg via INTRAVENOUS

## 2019-08-30 MED ORDER — CHLORHEXIDINE GLUCONATE 0.12 % MT SOLN
15.0000 mL | Freq: Once | OROMUCOSAL | Status: AC
  Administered 2019-08-30: 15 mL via OROMUCOSAL
  Filled 2019-08-30: qty 15

## 2019-08-30 SURGICAL SUPPLY — 20 items
CANISTER SUCT 3000ML PPV (MISCELLANEOUS) ×3 IMPLANT
CATH ROBINSON RED A/P 16FR (CATHETERS) IMPLANT
CURETTE PIPELLE ENDOMTRL SUCTN (MISCELLANEOUS) ×2 IMPLANT
DEVICE MYOSURE LITE (MISCELLANEOUS) IMPLANT
DEVICE MYOSURE REACH (MISCELLANEOUS) IMPLANT
DILATOR CANAL MILEX (MISCELLANEOUS) ×3 IMPLANT
GLOVE BIOGEL PI IND STRL 7.0 (GLOVE) ×4 IMPLANT
GLOVE BIOGEL PI INDICATOR 7.0 (GLOVE) ×2
GLOVE ECLIPSE 6.5 STRL STRAW (GLOVE) ×3 IMPLANT
GOWN STRL REUS W/ TWL LRG LVL3 (GOWN DISPOSABLE) ×4 IMPLANT
GOWN STRL REUS W/TWL LRG LVL3 (GOWN DISPOSABLE) ×6
KIT PROCEDURE FLUENT (KITS) ×3 IMPLANT
KIT TURNOVER KIT B (KITS) ×3 IMPLANT
PACK VAGINAL MINOR WOMEN LF (CUSTOM PROCEDURE TRAY) ×3 IMPLANT
PAD OB MATERNITY 4.3X12.25 (PERSONAL CARE ITEMS) ×3 IMPLANT
PIPELLE ENDOMETRIAL SUCTION CU (MISCELLANEOUS) ×3
SEAL ROD LENS SCOPE MYOSURE (ABLATOR) ×3 IMPLANT
TOWEL GREEN STERILE FF (TOWEL DISPOSABLE) ×6 IMPLANT
TRAY CATH 16FR W/PLASTIC CATH (SET/KITS/TRAYS/PACK) ×3 IMPLANT
UNDERPAD 30X36 HEAVY ABSORB (UNDERPADS AND DIAPERS) ×6 IMPLANT

## 2019-08-30 NOTE — Anesthesia Postprocedure Evaluation (Signed)
Anesthesia Post Note  Patient: Gabby Rackers Cutright  Procedure(s) Performed: EXAM UNDER ANESTHESIA, Endometial Biopsy (N/A Vagina )     Patient location during evaluation: PACU Anesthesia Type: General Level of consciousness: awake and alert Pain management: pain level controlled Vital Signs Assessment: post-procedure vital signs reviewed and stable Respiratory status: spontaneous breathing, nonlabored ventilation, respiratory function stable and patient connected to nasal cannula oxygen Cardiovascular status: blood pressure returned to baseline and stable Postop Assessment: no apparent nausea or vomiting Anesthetic complications: no    Last Vitals:  Vitals:   08/30/19 1200 08/30/19 1215  BP: (!) 110/56 98/66  Pulse: 83 98  Resp: 17 19  Temp:  36.7 C  SpO2: 95% 91%    Last Pain:  Vitals:   08/30/19 1215  TempSrc:   PainSc: 0-No pain                 Sally-Anne Wamble COKER

## 2019-08-30 NOTE — Transfer of Care (Signed)
Immediate Anesthesia Transfer of Care Note  Patient: Nicole Cabrera  Procedure(s) Performed: EXAM UNDER ANESTHESIA, Endometial Biopsy (N/A Vagina )  Patient Location: PACU  Anesthesia Type:General  Level of Consciousness: drowsy  Airway & Oxygen Therapy: Patient Spontanous Breathing and Patient connected to nasal cannula oxygen  Post-op Assessment: Report given to RN, Post -op Vital signs reviewed and stable and Patient moving all extremities  Post vital signs: Reviewed and stable  Last Vitals:  Vitals Value Taken Time  BP 129/110 08/30/19 1147  Temp 36.7 C 08/30/19 1145  Pulse 90 08/30/19 1147  Resp 20 08/30/19 1147  SpO2 88 % 08/30/19 1147  Vitals shown include unvalidated device data.  Last Pain:  Vitals:   08/30/19 1145  TempSrc:   PainSc: (P) Asleep      Patients Stated Pain Goal: 3 (99/77/41 4239)  Complications: No apparent anesthesia complications

## 2019-08-30 NOTE — H&P (Signed)
Nicole Cabrera is an 73 y.o. female G5P3 MWF with PMP bleeding here for hysteroscopy with D&C.  She started having bleeding in late May.  Bleeding increased on Friday 08/11/2019 and she called me on call.  Due to amt of bleeding, she was seen in the ER.  Endometrium was 53mm.  Office biopsy was obtained 08/14/2019 but only scant squamous epithelium was noted.  Hysteroscopy with D&C was recommended.  Cardiac clearance was felt necessary and this was completed on 08/25/2019.  She has been off her eliquis for 3 days at this point.    Risks, benefits reviewed.  Questions answered.  Pertinent Gynecological History: Menses: post-menopausal Bleeding: post menopausal bleeding Contraception: post menopausal status DES exposure: denies Blood transfusions: none Sexually transmitted diseases: no past history Previous GYN Procedures: prior D&C  Last mammogram: normal Date: 2018 Last pap: normal Date: 04/2018 OB History: G5, P3   Menstrual History: Patient's last menstrual period was 01/22/2011.    Past Medical History:  Diagnosis Date  . Allergy   . Anxiety    xanax  . Arthritis    shoulders/hips - tx with OTC meds  . Breast cyst 03/1990   pt denies 12/05/13  . Bronchitis   . Cataract   . Cervical tab 1972  . 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 Diabetes mellitus without complication ([place])    stated not diabetic but her AIC has been high so she is monitored closely   . Dyspnea   . Dysrhythmia   . Endometrial polyp 04/2000  . Fall 01/2018   nose fracture   . Heart murmur   . Hemorrhoids   . Hypertension   . Kidney stones   . Obesity   . Osteopenia 05/2007  . Paroxysmal atrial fibrillation ([facility])   . Peripheral neuropathy   . PMB (postmenopausal bleeding) 04/2000  . SAB (spontaneous abortion) 1981  . Sleep apnea    CPAP  . Stroke ([place])   . SVD (spontaneous vaginal delivery) 04/1964, 05/1978   x3  . TIA (transient ischemic attack)     Past Surgical History:  Procedure  Laterality Date  . CATARACT EXTRACTION Right 12/07/2018  . CERVICAL DISC SURGERY  2/93  . CESAREAN SECTION  6/83   with BTL  . COLONOSCOPY N/A 03/19/2017   Procedure: COLONOSCOPY;  Surgeon: Doran Stabler, MD;  Location: [facility];  Service: Gastroenterology;  Laterality: N/A;  . DILATION AND CURETTAGE OF UTERUS    . HYSTEROSCOPY  5/02, 01/2011  . JOINT REPLACEMENT    . LITHOTRIPSY  08/30/2014   with Dr. Jeffie Pollock at [facility]  . TOTAL KNEE ARTHROPLASTY Right 6/08   right  . TOTAL KNEE ARTHROPLASTY Left 4/10   left   . TUBAL LIGATION    . WRIST SURGERY Left 4/07   left    Family History  Problem Relation Age of Onset  . Heart disease Father   . Colon cancer Mother   . Heart disease Mother   . Breast cancer Maternal Aunt   . Colon cancer Maternal Aunt   . Colon cancer Maternal Uncle   . Ehlers-Danlos syndrome Other        neice-being tested 4/15  . Stomach cancer Neg Hx   . Pancreatic cancer Neg Hx     Social History:  reports that she has never smoked. She has never used smokeless tobacco. She reports that she does not drink alcohol or use drugs.  Allergies:  Allergies  Allergen Reactions  . Bactrim [  Sulfamethoxazole-Trimethoprim] Other (See Comments)    Unknown   . Ciprofloxacin Other (See Comments)    GI upset  . Hydroxyzine Hcl     Other reaction(s): Other (See Comments) Pt becomes physchotic.  Marland Kitchen Keflet [Cephalexin] Other (See Comments)    GI upset  . Lipitor [Atorvastatin]     Muscle cramps  . Lisinopril Cough  . Spironolactone Nausea Only  . Vistaril [Hydroxyzine Hcl] Other (See Comments)    Pt becomes physchotic.  Marland Kitchen Erythromycin Rash  . Latex Rash  . Penicillins Rash    Has patient had a PCN reaction causing immediate rash, facial/tongue/throat swelling, SOB or lightheadedness with hypotension: Yes Has patient had a PCN reaction causing severe rash involving mucus membranes or skin necrosis: No Has patient had a PCN reaction that required  hospitalization No Has patient had a PCN reaction occurring within the last 10 years: No If all of the above answers are "NO", then may proceed with Cephalosporin use.   . Tape Itching, Rash and Other (See Comments)    Burning  . Tetracycline Rash    Medications Prior to Admission  Medication Sig Dispense Refill Last Dose  . acetaminophen (TYLENOL) 500 MG tablet Take 1,000 mg by mouth every 6 (six) hours as needed for moderate pain or headache.    Past Week at Unknown time  . ALPRAZolam (XANAX) 0.25 MG tablet Take 0.25 mg by mouth daily as needed for anxiety.    Past Month at Unknown time  . chlorthalidone (HYGROTON) 25 MG tablet Take 25 mg by mouth daily.   08/29/2019 at Unknown time  . cholecalciferol (VITAMIN D) 1000 UNITS tablet Take 1,000 Units by mouth every morning.    08/29/2019 at Unknown time  . ELIQUIS 5 MG TABS tablet TAKE ONE TABLET BY MOUTH TWICE DAILY. (Patient taking differently: Take 5 mg by mouth 2 (two) times daily. ) 60 tablet 6 08/27/2019  . guaiFENesin (MUCINEX) 600 MG 12 hr tablet Take 600 mg by mouth daily as needed for cough.   08/30/2019 at 0700  . losartan (COZAAR) 100 MG tablet Take 1 tablet (100 mg total) by mouth daily. 90 tablet 3 08/29/2019 at Unknown time  . metoprolol tartrate (LOPRESSOR) 25 MG tablet TAKE ONE TABLET BY MOUTH TWICE DAILY, MAY TAKE  EXTRA TABLET AS NEEDED FOR PALPITATIONS. (Patient taking differently: Take 25 mg by mouth 2 (two) times daily. ) 270 tablet 3 08/30/2019 at 0700  . Multiple Vitamins-Minerals (MULTIVITAMIN WITH MINERALS) tablet Take 1 tablet by mouth daily.    08/29/2019 at Unknown time  . Polyethyl Glycol-Propyl Glycol (SYSTANE OP) Place 1 drop into both eyes daily as needed (dry eyes).    08/30/2019 at 0700  . potassium chloride SA (KLOR-CON) 20 MEQ tablet TAKE ONE TABLET BY MOUTH DAILY. (Patient taking differently: Take 20 mEq by mouth daily. ) 30 tablet 6 08/29/2019 at Unknown time  . PROAIR HFA 108 (90 Base) MCG/ACT inhaler INHALE TWO PUFFS BY  MOUTH EVERY 6 HOURS AS NEEDED FOR WHEEZING AND SHORTNESS OF BREATH (Patient taking differently: Inhale 2 puffs into the lungs every 6 (six) hours as needed. ) 8.5 g 3   . silver sulfADIAZINE (SILVADENE) 1 % cream Apply 1 application topically daily.    08/30/2019 at 0700  . norethindrone (AYGESTIN) 5 MG tablet Take 1 tablet (5 mg total) by mouth daily. (Patient not taking: Reported on 08/28/2019) 15 tablet 0 Not Taking at Unknown time    Review of Systems  All other systems reviewed  and are negative.   Blood pressure 139/83, pulse 71, temperature 97.7 F (36.5 C), temperature source Tympanic, resp. rate 18, height 4' 11.5" (1.511 m), weight 116.3 kg, last menstrual period 01/22/2011, SpO2 98 %. Physical Exam  Constitutional: She is oriented to person, place, and time. She appears well-developed and well-nourished.  Cardiovascular:  Irregular today, no murmurs  Respiratory: Effort normal and breath sounds normal.  Neurological: She is alert and oriented to person, place, and time.  Skin: Skin is warm and dry.  Psychiatric: She has a normal mood and affect.    Results for orders placed or performed during the hospital encounter of 08/30/19 (from the past 24 hour(s))  Glucose, capillary     Status: Abnormal   Collection Time: 08/30/19  8:41 AM  Result Value Ref Range   Glucose-Capillary 116 (H) 70 - 99 mg/dL    No results found.  Assessment/Plan: 74 yo G5P3 with PMP bleeding thickened endometrium on ultrasound and insufficient specimen on biopsy.  She is ready to proceed.    Megan Salon 08/30/2019, 9:36 AM

## 2019-08-30 NOTE — Progress Notes (Addendum)
No antibiotic per Dr. Sabra Heck  No PT-INR per Dr. Linna Caprice and Dr. Sabra Heck

## 2019-08-30 NOTE — Op Note (Signed)
08/30/2019  11:34 AM  PATIENT:  Nicole Cabrera  74 y.o. female  PRE-OPERATIVE DIAGNOSIS:  Postmenopausal bleeding  POST-OPERATIVE DIAGNOSIS:  Postmenopausal bleeding  PROCEDURE:  Procedure(s): EXAM UNDER ANESTHESIA, Endometial Biopsy  SURGEON:  Megan Salon  ASSISTANTS: OR staff   ANESTHESIA:   general  ESTIMATED BLOOD LOSS: 5cc  BLOOD ADMINISTERED:none   FLUIDS: 800cc LR  UOP: 25cc  SPECIMEN:  Endometrial curetting   DISPOSITION OF SPECIMEN:  PATHOLOGY  FINDINGS: extremely long vagina with cervix flush with apex of vagina  DESCRIPTION OF OPERATION: Patient was taken to the operating room.  She is placed in the supine position. SCDs were on her lower extremities and functioning properly. General anesthesia with an LMA was administered without difficulty. Dr. Linna Caprice, anesthesia, oversaw case.  Legs were then placed in the [place] in the low lithotomy position. The legs were lifted to the high lithotomy position and the Betadine prep was used on the inner thighs perineum and vagina x3. Patient was draped in a normal standard fashion. An in and out catheterization with a red rubber Foley catheter was performed. Approximately 25 cc of clear urine was noted. A bivalve speculum was placed the vagina. The cervix could not be visualized.  About 30 minutes was spent attempting to locate the cervix due to the length of the vagina, collapsing sidewalls and the narrowing of the vaginal apex.  As well, the cervix was flush was the apex.  Ultimately the longest graves speculum available in the hospital was used.  OR staff even went to the maternity admissions unit to see if any longer equipment was available.  Eventually a dimple was noted and a small amount of tissue was grasped with the tenaculum as I was unsure if this was the cervix.  Then an endometrial pipelle was used to probe the dimple.  This passed through the os and into the endometrial cavity.  The uterus sounded to 9 cm.  My  hand was flush with the end of the speculum to try and obtained the endometrial specimen.  This pipelle was then used multiple times like a curette to obtain an endometrial sampling from all quadrants.  Because of the length of the vagina and the difficult with instruments. I felt it was safest to not attempt any further procedures.    At this point the procedure was ended.   The prep was cleansed of the patient's skin. The legs are positioned back in the supine position. Sponge, lap, needle, initially counts were correct x2. Patient was taken to recovery in stable condition.  COUNTS:  YES  PLAN OF CARE: Transfer to PACU

## 2019-08-30 NOTE — Anesthesia Procedure Notes (Signed)
Procedure Name: Intubation Date/Time: 08/30/2019 10:30 AM Performed by: Davinia Riccardi T, CRNA Pre-anesthesia Checklist: Patient identified, Emergency Drugs available, Suction available and Patient being monitored Patient Re-evaluated:Patient Re-evaluated prior to induction Oxygen Delivery Method: Circle system utilized Preoxygenation: Pre-oxygenation with 100% oxygen Induction Type: IV induction Ventilation: Mask ventilation without difficulty Laryngoscope Size: Miller and 2 Grade View: Grade II Tube type: Oral Tube size: 7.0 mm Number of attempts: 1 Airway Equipment and Method: Stylet and Patient positioned with wedge pillow Placement Confirmation: ETT inserted through vocal cords under direct vision,  positive ETCO2 and breath sounds checked- equal and bilateral Secured at: 21 cm Tube secured with: Tape Dental Injury: Teeth and Oropharynx as per pre-operative assessment

## 2019-08-30 NOTE — Discharge Instructions (Addendum)
Post-surgical Instructions, Outpatient Surgery  You may expect to feel dizzy, weak, and drowsy for as long as 24 hours after receiving the medicine that made you sleep (anesthetic). For the first 24 hours after your surgery:    Do not drive a car, ride a bicycle, participate in physical activities, or take public transportation until you are done taking narcotic pain medicines or as directed by Dr. Sabra Heck.   Do not drink alcohol or take tranquilizers.   Do not take medicine that has not been prescribed by your physicians.   Do not sign important papers or make important decisions while on narcotic pain medicines.   Have a responsible person with you.   PAIN MANAGEMENT  Motrin 800mg .  (This is the same as 4-200mg  over the counter tablets of Motrin or ibuprofen.)  You may take this every eight hours or as needed for cramping.    You could also take 400mg  ibuprofen and 500mg  Tylenol every 4 hours as needed for cramping.    DO'S AND DON'T'S  Do not take a tub bath for one week.  You may shower on the first day after your surgery  Do not do any heavy lifting for one to two weeks.  This increases the chance of bleeding.  Do move around as you feel able.  Stairs are fine.  You may begin to exercise again as you feel able.  Do not lift any weights for two weeks.  Do not put anything in the vagina for two weeks--no tampons, intercourse, or douching.    REGULAR MEDIATIONS/VITAMINS:  You may restart all of your regular medications as prescribed.  You may restart all of your vitamins as you normally take them.    PLEASE CALL OR SEEK MEDICAL CARE IF:  You have persistent nausea and vomiting.   You have trouble eating or drinking.   You have an oral temperature above 100.5.   You have constipation that is not helped by adjusting diet or increasing fluid intake. Pain medicines are a common cause of constipation.   You have heavy vaginal bleeding

## 2019-08-30 NOTE — Anesthesia Preprocedure Evaluation (Addendum)
Anesthesia Evaluation  Patient identified by MRN, date of birth, ID band Patient awake    Reviewed: Allergy & Precautions, NPO status , Patient's Chart, lab work & pertinent test results  Airway Mallampati: III  TM Distance: >3 FB Neck ROM: Full    Dental  (+) Teeth Intact, Dental Advisory Given   Pulmonary    breath sounds clear to auscultation       Cardiovascular hypertension,  Rhythm:Irregular Rate:Normal     Neuro/Psych    GI/Hepatic   Endo/Other  diabetes  Renal/GU      Musculoskeletal   Abdominal (+) + obese,   Peds  Hematology   Anesthesia Other Findings   Reproductive/Obstetrics                             Anesthesia Physical Anesthesia Plan  ASA: III  Anesthesia Plan: General   Post-op Pain Management:    Induction: Intravenous  PONV Risk Score and Plan: Ondansetron  Airway Management Planned: Oral ETT  Additional Equipment:   Intra-op Plan:   Post-operative Plan: Extubation in OR  Informed Consent: I have reviewed the patients History and Physical, chart, labs and discussed the procedure including the risks, benefits and alternatives for the proposed anesthesia with the patient or authorized representative who has indicated his/her understanding and acceptance.     Dental advisory given  Plan Discussed with: CRNA and Anesthesiologist  Anesthesia Plan Comments:         Anesthesia Quick Evaluation

## 2019-08-31 LAB — SURGICAL PATHOLOGY

## 2019-09-01 ENCOUNTER — Telehealth: Payer: Self-pay

## 2019-09-01 DIAGNOSIS — N8501 Benign endometrial hyperplasia: Secondary | ICD-10-CM

## 2019-09-01 DIAGNOSIS — N8502 Endometrial intraepithelial neoplasia [EIN]: Secondary | ICD-10-CM

## 2019-09-01 NOTE — Telephone Encounter (Signed)
Call to [facility] x 3. There was no answer. Left a voicemail requesting a return call to schedule an appointment for the patient. Referral placed in Epic for [facility] review as they typically want to review the records now before the appointment is scheduled.

## 2019-09-05 ENCOUNTER — Telehealth: Payer: Self-pay

## 2019-09-05 NOTE — Telephone Encounter (Signed)
Spoke with patient. Patient states that she is still having light bleeding daily. Denies any heavy bleeding. Patient states that she has not started Aygestin that was prescribed by Dr.Miller, but that she did pick this up from the pharmacy. Advised she will need to go ahead and start this medication until her follow up appointment with [facility]. Patient is agreeable. Advised Dr.Miller had another thought on the patient seeing [facility] for a consult/second opinion before her follow up. Patient does not desire an appointment at this time. Would like to be seen with Dr.Miller for follow up and proceed if needed.  Dr.Miller, I only see this patient scheduled for 2 week follow up from surgery. Could you remind me when you wanted to see her back for follow up and repeat EMB?

## 2019-09-06 NOTE — Telephone Encounter (Signed)
Patient has lost her progesterone and needs a new prescription called to [facility].

## 2019-09-06 NOTE — Telephone Encounter (Signed)
Pt states has lost Rx Aygestin from filled on 08/23/19. Was called by Verline Lema, RN yesterday to start taking due to light spotting/bleeding. Pt denies any heavy bleeding or clots today. See phone encounter dated 09/05/19. Call placed to [facility] for lost Rx. Rx refilled and pt will be notified when ready. # 15, 0RF.  Call placed back to pt and pt thankful for Rx refill. Pt to start tomorrow. Pt aware of appt for 2 week follow up on 09/14/19. Advised will give update to Dr Sabra Heck. Pt agreeable.   Routing to Dr Sabra Heck for review

## 2019-09-06 NOTE — Telephone Encounter (Signed)
Please below regarding the patient's referral

## 2019-09-11 NOTE — Progress Notes (Addendum)
GYNECOLOGY  VISIT  CC:   Recheck after AEX, endometrial biopsy, treatment plans  HPI: 74 y.o. [id number] Married White or Caucasian female here for 2 week post op after undergoing EUA and endometrial biopsy.  Hysteroscopy was planned but due to lack of visualization of the cervix in the OR due to inadequate length speculum, multiple passes with endometrial pipelle was what could be completed.  Pathology showed simple and complex hyperplasia without atypia.  Pt was given rx for norethindrone when bleeding started.  She was advised to continue this post operative.  She reports today that she has not actually started this.  She does not have any bleeding now.  She is concerned about clotting risk that was on paperwork for the norethindrone.  Pt and I discussed risks of progression to cancer and need for treatment.  Oral progesterone therapy with follow up biopsy (ies), Mirena IUD, and hysterectomy discussed.  Also, gyn/onc consultation was recommended but pt declined this.  At this time she doesn't really want to do anything as she isn't bleeding.  Advised pt she will, at some point, start bleeding again and this will slowly worsen and can turn into endometrial cancer.  She fully understands this and is willing, at least right now, to start progesterone therapy.  Rx for Megace will be sent to pharmacy.  Follow up biopsy in 4 months will be planned.  Pt aware if there is worsening of pathology, will need more aggressive therapy at that time.  Pt reassured about clotting risk with DVT.    Separately, reviewed husband's concerns about neurology follow up with pt.  She level hospital in November with second stroke prior to seeing neurologist.  She is willing to do this now.  We will call the office to see if referral is needed.    GYNECOLOGIC HISTORY: Patient's last menstrual period was 01/22/2011. Contraception: btl Menopausal hormone therapy: none  Patient Active Problem List   Diagnosis Date Noted  . Acute  ischemic stroke ([place]) 02/08/2019  . Acute CVA (cerebrovascular accident) ([place]) 02/07/2019  . Coagulopathy ([place]) 02/07/2019  . Anticoagulated 02/07/2019  . Visual changes 02/07/2019  . Leukocytosis 02/07/2019  . Hyperglycemia 02/07/2019  . Diabetes mellitus without complication ([place])   . Hypokalemia 03/19/2017  . HLD (hyperlipidemia) 03/19/2017  . Hematochezia   . Acute blood loss anemia   . Rectal bleeding 03/18/2017  . Acute GI bleeding   . 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 Stroke (cerebrum) ([place]) 01/18/2017  . Intracerebral hemorrhage 01/18/2017  . Elevated troponin 01/17/2017  . Atrial fibrillation ([place]) 10/27/2016  . Obesity 06/20/2015  . Nephrolithiasis 07/09/2014  . Asthma with bronchitis 02/23/2014  . Aphasia 02/27/2013  . ACUTE BRONCHITIS 08/30/2009  . Essential hypertension 02/22/2007  . Obstructive sleep apnea 02/22/2007  . ALLERGY 02/22/2007    Past Medical History:  Diagnosis Date  . Allergy   . Anxiety    xanax  . Arthritis    shoulders/hips - tx with OTC meds  . Breast cyst 03/1990   pt denies 12/05/13  . Bronchitis   . Cataract   . Cervical tab 1972  . 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 Diabetes mellitus without complication ([place])    stated not diabetic but her AIC has been high so she is monitored closely   . Dyspnea   . Dysrhythmia   . Endometrial polyp 04/2000  . Fall 01/2018   nose fracture   . Heart murmur   . Hemorrhoids   . Hypertension   .  Kidney stones   . Obesity   . Osteopenia 05/2007  . Paroxysmal atrial fibrillation ([facility])   . Peripheral neuropathy   . PMB (postmenopausal bleeding) 04/2000  . SAB (spontaneous abortion) 1981  . Sleep apnea    CPAP  . Stroke ([place])   . SVD (spontaneous vaginal delivery) 04/1964, 05/1978   x3  . TIA (transient ischemic attack)     Past Surgical History:  Procedure Laterality Date  . CATARACT EXTRACTION Right 12/07/2018  . CERVICAL DISC SURGERY  2/93  . CESAREAN  SECTION  6/83   with BTL  . COLONOSCOPY N/A 03/19/2017   Procedure: COLONOSCOPY;  Surgeon: Doran Stabler, MD;  Location: [facility];  Service: Gastroenterology;  Laterality: N/A;  . DILATION AND CURETTAGE OF UTERUS    . HYSTEROSCOPY  5/02, 01/2011  . JOINT REPLACEMENT    . LITHOTRIPSY  08/30/2014   with Dr. Jeffie Pollock at [facility]  . TOTAL KNEE ARTHROPLASTY Right 6/08   right  . TOTAL KNEE ARTHROPLASTY Left 4/10   left   . TUBAL LIGATION    . WRIST SURGERY Left 4/07   left    MEDS:   Current Outpatient Medications on File Prior to Visit  Medication Sig Dispense Refill  . acetaminophen (TYLENOL) 500 MG tablet Take 1,000 mg by mouth every 6 (six) hours as needed for moderate pain or headache.     . ALPRAZolam (XANAX) 0.25 MG tablet Take 0.25 mg by mouth daily as needed for anxiety.     . chlorthalidone (HYGROTON) 25 MG tablet Take 25 mg by mouth daily.    . cholecalciferol (VITAMIN D) 1000 UNITS tablet Take 1,000 Units by mouth every morning.     Marland Kitchen ELIQUIS 5 MG TABS tablet TAKE ONE TABLET BY MOUTH TWICE DAILY. (Patient taking differently: Take 5 mg by mouth 2 (two) times daily. ) 60 tablet 6  . guaiFENesin (MUCINEX) 600 MG 12 hr tablet Take 600 mg by mouth daily as needed for cough.    . losartan (COZAAR) 100 MG tablet Take 1 tablet (100 mg total) by mouth daily. 90 tablet 3  . metoprolol tartrate (LOPRESSOR) 25 MG tablet TAKE ONE TABLET BY MOUTH TWICE DAILY, MAY TAKE  EXTRA TABLET AS NEEDED FOR PALPITATIONS. (Patient taking differently: Take 25 mg by mouth 2 (two) times daily. ) 270 tablet 3  . Multiple Vitamins-Minerals (MULTIVITAMIN WITH MINERALS) tablet Take 1 tablet by mouth daily.     Vladimir Faster Glycol-Propyl Glycol (SYSTANE OP) Place 1 drop into both eyes daily as needed (dry eyes).     . potassium chloride SA (KLOR-CON) 20 MEQ tablet TAKE ONE TABLET BY MOUTH DAILY. (Patient taking differently: Take 20 mEq by mouth daily. ) 30 tablet 6  . PROAIR HFA 108 (90 Base) MCG/ACT  inhaler INHALE TWO PUFFS BY MOUTH EVERY 6 HOURS AS NEEDED FOR WHEEZING AND SHORTNESS OF BREATH (Patient taking differently: Inhale 2 puffs into the lungs every 6 (six) hours as needed. ) 8.5 g 3  . silver sulfADIAZINE (SILVADENE) 1 % cream Apply 1 application topically daily.      No current facility-administered medications on file prior to visit.    ALLERGIES: Bactrim [sulfamethoxazole-trimethoprim], Ciprofloxacin, Hydroxyzine hcl, Keflet [cephalexin], Lipitor [atorvastatin], Lisinopril, Spironolactone, Vistaril [hydroxyzine hcl], Erythromycin, Latex, Penicillins, Tape, and Tetracycline  Family History  Problem Relation Age of Onset  . Heart disease Father   . Colon cancer Mother   . Heart disease Mother   . Breast cancer Maternal Aunt   .  Colon cancer Maternal Aunt   . Colon cancer Maternal Uncle   . Ehlers-Danlos syndrome Other        neice-being tested 4/15  . Stomach cancer Neg Hx   . Pancreatic cancer Neg Hx     SH:  Married, non smoker  Review of Systems  Constitutional: Negative.   HENT: Negative.   Eyes: Negative.   Respiratory: Negative.   Cardiovascular: Negative.   Gastrointestinal: Negative.   Endocrine: Negative.   Genitourinary: Negative.   Musculoskeletal: Negative.   Skin: Negative.   Allergic/Immunologic: Negative.   Neurological: Negative.   Hematological: Negative.   Psychiatric/Behavioral: Negative.     PHYSICAL EXAMINATION:    BP 114/80   Pulse 76   Temp 97.7 F (36.5 C) (Skin)   Resp 20   Wt 255 lb (115.7 kg)   LMP 01/22/2011   BMI 50.64 kg/m     General appearance: alert, cooperative and appears stated age Breasts: normal appearance, no masses or tenderness Abdomen: soft, non-tender; bowel sounds normal; no masses,  no organomegaly Lymph:  no inguinal LAD noted  Pelvic: External genitalia:  no lesions              Urethra:  normal appearing urethra with no masses, tenderness or lesions              Bartholins and Skenes: normal                  Vagina: normal appearing vagina with normal color and discharge, no lesions              Cervix: no lesions              Bimanual Exam:  Uterus:  normal size, contour, position, consistency, mobility, non-tender              Adnexa: no mass, fullness, tenderness  Chaperone, Terence Lux, CMA, was present for exam.  Assessment: PMP bleeding Complex endometrial hyperplasia without atypia H/o afib, on eliquis H/o stroke x 2 Morbid obesity with BMI >50  Plan: Megace 40mg  daily will be started.  Reviewed with gyn oncology.  Repeat biopsy planned for 4 month. Gyn/onc consultation discussed.  She declined.  Declined Mirena IUD use as well. My office will call for neurologist appt [facility] due.  Pt states she will schedule.  About 30 minutes total spent with pt reviewing pathology, cancer risk, treatment options, risks/benefits, neurology concerns.  This was a lengthy visit with discussion that is outside the typical post operative visit.

## 2019-09-13 ENCOUNTER — Telehealth: Payer: Self-pay

## 2019-09-13 NOTE — Telephone Encounter (Signed)
Patient states she is returning call to [facility]. No open encounter.

## 2019-09-13 NOTE — Telephone Encounter (Signed)
Spoke with patient. Patient has a post op appointment scheduled for tomorrow with Dr.Miller and has an aex scheduled for 09/29/2019 with Dr.Miller. Asking if she needs to come to the office tomorrow. Advised needs to keep her 2 week post op and can discuss with Dr.Miller when she would like her to return for aex.  Routing to provider and will close encounter.

## 2019-09-14 ENCOUNTER — Ambulatory Visit (INDEPENDENT_AMBULATORY_CARE_PROVIDER_SITE_OTHER): Payer: BC Managed Care – PPO | Admitting: Obstetrics & Gynecology

## 2019-09-14 ENCOUNTER — Other Ambulatory Visit: Payer: Self-pay

## 2019-09-14 ENCOUNTER — Encounter: Payer: Self-pay | Admitting: Obstetrics & Gynecology

## 2019-09-14 VITALS — BP 114/80 | HR 76 | Temp 97.7°F | Resp 20 | Wt 255.0 lb

## 2019-09-14 DIAGNOSIS — N8501 Benign endometrial hyperplasia: Secondary | ICD-10-CM

## 2019-09-14 DIAGNOSIS — N950 Postmenopausal bleeding: Secondary | ICD-10-CM

## 2019-09-14 DIAGNOSIS — I2510 Atherosclerotic heart disease of native coronary artery without angina pectoris: Secondary | ICD-10-CM

## 2019-09-15 ENCOUNTER — Telehealth: Payer: Self-pay

## 2019-09-15 MED ORDER — MEGESTROL ACETATE 40 MG PO TABS
40.0000 mg | ORAL_TABLET | Freq: Every day | ORAL | 3 refills | Status: DC
Start: 2019-09-15 — End: 2019-11-03

## 2019-09-15 NOTE — Telephone Encounter (Signed)
-----   Message from Megan Salon, MD sent at 09/14/2019  6:00 PM EDT ----- Regarding: endometrial biopsy She needs repeat endometrial biopsy in 4 months.  Thanks.   Vinnie Level ----- Message ----- From: Georgia Lopes, RN Sent: 09/14/2019   5:02 PM EDT To: Megan Salon, MD  Yes I will be happy too.   Also pt asked before she left if she needs repeat EMB? And in how many months?   Thank you,  Colletta [place]  ----- Message ----- From: Megan Salon, MD Sent: 09/14/2019   4:30 PM EDT To: Georgia Lopes, RN  Olando Willems, Pt has stroke in 01/2019.  She left hospital AMA and has not seen neurology since.  She is having some short term memory issues.  Can you please call and set up follow up for her?  I think she needs to see an MD and not NP.  She saw Dr Leonie Man in the hospital.  Thanks. Vinnie Level

## 2019-09-15 NOTE — Addendum Note (Signed)
Addended by: Megan Salon on: 09/15/2019 07:29 PM   Modules accepted: Orders

## 2019-09-15 NOTE — Telephone Encounter (Signed)
Left message for pt to return call to triage RN. 

## 2019-09-18 NOTE — Telephone Encounter (Signed)
Spoke with pt. Pt given update and recommendations per Dr Sabra Heck. Pt agreeable to schedule repeat EMB.  Pt scheduled for repeat EMB on 12/28/19 at 4pm per pt's request of date and time of appt. Pt verbalized understanding of date and time of appt.

## 2019-09-18 NOTE — Telephone Encounter (Signed)
Call placed to [facility]. Spoke with Hinton Dyer. Pt scheduled for follow up with first available appt with Dr Leonie Man on 10/19/19 at 2:30 pm. Will send records through Epic for review. Will call pt to make aware of appt.   Call placed to pt. Spoke with pt. Pt agreeable and verbalized understanding of appt with Dr Leonie Man.   Routing to Dr Sabra Heck for review.  Encounter closed.

## 2019-09-29 ENCOUNTER — Ambulatory Visit: Payer: Medicare Other | Admitting: Obstetrics & Gynecology

## 2019-10-17 DIAGNOSIS — G4733 Obstructive sleep apnea (adult) (pediatric): Secondary | ICD-10-CM | POA: Diagnosis not present

## 2019-10-18 DIAGNOSIS — L600 Ingrowing nail: Secondary | ICD-10-CM | POA: Diagnosis not present

## 2019-10-18 DIAGNOSIS — M79674 Pain in right toe(s): Secondary | ICD-10-CM | POA: Diagnosis not present

## 2019-10-18 DIAGNOSIS — M79671 Pain in right foot: Secondary | ICD-10-CM | POA: Diagnosis not present

## 2019-10-18 DIAGNOSIS — L03031 Cellulitis of right toe: Secondary | ICD-10-CM | POA: Diagnosis not present

## 2019-10-19 ENCOUNTER — Ambulatory Visit (INDEPENDENT_AMBULATORY_CARE_PROVIDER_SITE_OTHER): Payer: BC Managed Care – PPO | Admitting: Neurology

## 2019-10-19 VITALS — BP 137/93 | Ht 59.0 in | Wt 248.0 lb

## 2019-10-19 DIAGNOSIS — I639 Cerebral infarction, unspecified: Secondary | ICD-10-CM | POA: Diagnosis not present

## 2019-10-19 DIAGNOSIS — E785 Hyperlipidemia, unspecified: Secondary | ICD-10-CM | POA: Diagnosis not present

## 2019-10-19 DIAGNOSIS — E119 Type 2 diabetes mellitus without complications: Secondary | ICD-10-CM | POA: Diagnosis not present

## 2019-10-19 DIAGNOSIS — I63111 Cerebral infarction due to embolism of right vertebral artery: Secondary | ICD-10-CM | POA: Diagnosis not present

## 2019-10-19 DIAGNOSIS — I2510 Atherosclerotic heart disease of native coronary artery without angina pectoris: Secondary | ICD-10-CM

## 2019-10-19 DIAGNOSIS — G3184 Mild cognitive impairment, so stated: Secondary | ICD-10-CM

## 2019-10-19 NOTE — Progress Notes (Signed)
[facility] [address] [place]. [place] [address] [phone]       OFFICE FOLLOW UP NOTE  Nicole Cabrera Date of Birth:  02/02/1946 Medical Record Number:  [id number]   Reason for Referral:  Hospital stroke follow up  CHIEF COMPLAINT:  Chief Complaint  Patient presents with  . 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 History obtained from patient and chart review. Reviewed all radiology images and labs personally. Nicole Cabrera is a 74 year old female with PMH of HTN, PAF, HLD, OSA on CPAP and morbid obesity who was admitted to [facility] for slurred speech/word finding difficulties, HA, right visual field  cut and right sided weakness. Patient used Tele Neurology at [facility] hospital and was transferred to [facility] for evaluation of stroke. CT negative for hemorrhage. MRI reviewed and showed acute small right paretal infarct and subacute left patchy parietal infarct with petechial hemorrhage. CTA negative for intracranial stenosis. 2D Echo negative for PFO or cardiac source of embolis and showed LVEF 55-60%. LDL 99 and HgbA1c 6.3. Prior to admission patient was on aspirin 81mg .  Patient was diagnosed with atrial fibrillation in June 2018. And was prescribed eliquis but Patient opted to hold off on anticoagulation due to upcoming colonoscopy and did not restart her eliquis after the procedure.  Patient started on Eliquis during hospitalization in October 2018 for stroke.  Patient also discharged on Lipitor 40 mg.  Patient discharged home in stable condition.            05/06/16 visit: Since discharge, patient has been doing well overall.  Patient is accompanied at today's visit by her husband.  Patient continues to take Eliquis for atrial fibrillation and denies side  effects of increased bleeding or bruising.  Patient did have hospitalization on 03/18/17 for rectal bleeding which was found to be related to recent polypectomy.  Patient recovered well and was restarted on Eliquis at discharge.  Patient continues to take Lipitor 40 mg but is complaining of increased muscle and joint pains.  Patient has a history of OSA and does use CPAP nightly.  Patient does complain that she feels her memory has worsened since his stroke but does have a history of memory issues prior to the stroke.  Blood pressure at today's visit is 150/77.  Patient states she does check this at home and typically runs 140/70-80.  Patient denies new or worsening stroke/TIA symptoms.  Visit 11/03/17: Patient is being seen today for scheduled follow-up visit and is accompanied by her son.  She continues to do well from a stroke standpoint.  She continues to take Eliquis without side effects of bleeding or bruising.  She did take decreased dose of Lipitor for 1 month but due to continued myalgias she stopped taking this medication.  Muscle cramping and pain subsided when she stopped taking Lipitor.  She has not had any repeat labs by PCP but states this will be repeated in November at follow-up appointment.  Patient declined to repeat labs at today's visit.  Recommend that if LDL greater than 70 to start statin for HLD management such as Crestor.  Blood pressure today elevated at 167/86 and patient states she does monitor this at home and typical SBP 1 50-1  60.  Patient states she has been struggling attempting to lower blood pressure at her last appointment with PCP it was agreed upon for patient to try to increase exercise and activity along with weight loss prior to adjusting medications.  Patient states she has been unable to manage weight loss at this time.  She does continue to use CPAP for OSA management.  She does complain of pain in bilateral hands which have been present for the past couple years but  have been worsening.  Recommended PCP follow-up regarding this complaint.  Patient also has short-term memory loss complaints which she states became worse when she had the stroke but has been stable since.  Recommended doing memory exercises but patient declined stating "I just do not feel like doing anything".  Denies new or worsening stroke/TIA symptoms.  Interval History 05/09/18: [name] is being seen today for stroke follow-up.  She continues to do well from a stroke standpoint without residual deficits or recurring of symptoms.  She continues on Eliquis without side effects of bleeding or bruising.  After prior visit, atorvastatin discontinued due to statin myalgias and does states she had lipid panel repeated by PCP with satisfactory levels.  Blood pressure today 156/80 but does monitor at home and typically 140s/80s.  She continues compliance with CPAP for OSA management.  Denies new or worsening stroke/TIA symptoms. Update 10/19/2019: She returns for follow-up after last visit in February 2020.  She states she had admission in November 2020 for strokes.  MRI scan showed tiny right cerebellar and left basal ganglia patchy infarcts.  MRI of the brain showed no large vessel stenosis.  Echocardiogram showed normal ejection fraction.  Carotid ultrasound was unremarkable.  She was on Eliquis for an had been compliant with it.  Baby aspirin was added however the patient states she has not been taking it.  She remains on Eliquis which is tolerating well without bruising or bleeding.  Blood pressures well controlled today it is 137/73.  The patient did have right hip pain and saw orthopedic doctor who did an MRI which confirmed a right hip hematoma.  She had not fallen or hurt herself this was spontaneous.  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  She is back on Eliquis and tolerating it well.  The patient and wife both feel that she has had some cognitive impairment and short-term memory difficulties and finding it difficult to figure out complicated staff that she could previously do.  This is not progressive but is annoying.  Is no family history of Alzheimer's.  She denies any headaches, recurrent stroke or TIA symptoms  ROS:   14 system review of systems performed and negative with exception of memory loss, cognitive impairment, imbalance, gait difficulty, joint pain, back pain, and all other systems negative PMH:  Past Medical History:  Diagnosis Date  . Allergy   . Anxiety    xanax  . Arthritis    shoulders/hips - tx with OTC meds  . Breast cyst 03/1990   pt denies 12/05/13  . Bronchitis   . Cataract   . Cervical tab 1972  . 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 Diabetes mellitus without complication ([place])    stated not diabetic but her AIC has been high so she is monitored closely   . Dyspnea   .  Dysrhythmia   . Endometrial polyp 04/2000  . Fall 01/2018   nose fracture   . Heart murmur   . Hemorrhoids   . Hypertension   . Kidney stones   . Obesity   . Osteopenia 05/2007  . Paroxysmal atrial fibrillation ([facility])   . Peripheral neuropathy   . PMB (postmenopausal bleeding) 04/2000  . SAB (spontaneous abortion) 1981  . Sleep apnea    CPAP  . Stroke ([place])   . SVD (spontaneous vaginal delivery) 04/1964, 05/1978   x3  . TIA (transient ischemic attack)     PSH:  Past Surgical History:  Procedure Laterality Date  . CATARACT EXTRACTION Right 12/07/2018  . CERVICAL DISC SURGERY  2/93  . CESAREAN SECTION  6/83   with BTL  . COLONOSCOPY N/A 03/19/2017   Procedure: COLONOSCOPY;  Surgeon: Doran Stabler, MD;  Location: [facility];  Service: Gastroenterology;  Laterality: N/A;  . DILATION AND CURETTAGE OF UTERUS    . HYSTEROSCOPY  5/02, 01/2011  . JOINT REPLACEMENT    . LITHOTRIPSY   08/30/2014   with Dr. Jeffie Pollock at [facility]  . TOTAL KNEE ARTHROPLASTY Right 6/08   right  . TOTAL KNEE ARTHROPLASTY Left 4/10   left   . TUBAL LIGATION    . WRIST SURGERY Left 4/07   left    Social History:  Social History   Socioeconomic History  . Marital status: Married    Spouse name: [name]  . Number of children: 3  . Years of education: college  . Highest education level: Not on file  Occupational History  . 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 Smoking status: Never Smoker  . Smokeless tobacco: Never Used  Vaping Use  . Vaping Use: Never used  Substance and Sexual Activity  . Alcohol use: No  . Drug use: No  . Sexual activity: Not Currently    Partners: Male    Birth control/protection: Surgical    Comment: BTL  Other Topics Concern  . Not on file  Social History Narrative   Patient is married [name])   Patient has three children.   Patient is a homemaker.   Education- College   Right handed.   Caffeine- one coke cola          Social Determinants of Health   Financial Resource Strain:   . Difficulty of Paying Living Expenses:   Food Insecurity:   . Worried About Charity fundraiser in the Last Year:   . Arboriculturist in the Last Year:   Transportation Needs:   . Film/video editor (Medical):   Marland Kitchen Lack of Transportation (Non-Medical):   Physical Activity:   . Days of Exercise per Week:   . Minutes of Exercise per Session:   Stress:   . Feeling of Stress :   Social Connections:   . Frequency of Communication with Friends and Family:   . Frequency of Social Gatherings with Friends and Family:   . Attends Religious Services:   . Active Member of Clubs or Organizations:   . Attends Archivist Meetings:   Marland Kitchen Marital Status:   Intimate Partner Violence:   . Fear of Current or Ex-Partner:   . Emotionally Abused:   Marland Kitchen Physically Abused:   . Sexually Abused:     Family History:    Family History  Problem Relation Age of Onset  . Heart disease  Father   . Colon cancer Mother   . Heart disease Mother   . Breast cancer Maternal Aunt   . Colon cancer Maternal Aunt   . Colon cancer Maternal Uncle   . Ehlers-Danlos syndrome Other        neice-being tested 4/15  . Stomach cancer Neg Hx   . Pancreatic cancer Neg Hx     Medications:   Current Outpatient Medications on File Prior to Visit  Medication Sig Dispense Refill  . acetaminophen (TYLENOL) 500 MG tablet Take 1,000 mg by mouth every 6 (six) hours as needed for moderate pain or headache.     . ALPRAZolam (XANAX) 0.25 MG tablet Take 0.25 mg by mouth daily as needed for anxiety.     . chlorthalidone (HYGROTON) 25 MG tablet Take 25 mg by mouth daily.    . cholecalciferol (VITAMIN D) 1000 UNITS tablet Take 1,000 Units by mouth every morning.     Marland Kitchen ELIQUIS 5 MG TABS tablet TAKE ONE TABLET BY MOUTH TWICE DAILY. (Patient taking differently: Take 5 mg by mouth 2 (two) times daily. ) 60 tablet 6  . guaiFENesin (MUCINEX) 600 MG 12 hr tablet Take 600 mg by mouth daily as needed for cough.    . megestrol (MEGACE) 40 MG tablet Take 1 tablet (40 mg total) by mouth daily. 30 tablet 3  . metoprolol tartrate (LOPRESSOR) 25 MG tablet TAKE ONE TABLET BY MOUTH TWICE DAILY, MAY TAKE  EXTRA TABLET AS NEEDED FOR PALPITATIONS. (Patient taking differently: Take 25 mg by mouth 2 (two) times daily. ) 270 tablet 3  . Multiple Vitamins-Minerals (MULTIVITAMIN WITH MINERALS) tablet Take 1 tablet by mouth daily.     Vladimir Faster Glycol-Propyl Glycol (SYSTANE OP) Place 1 drop into both eyes daily as needed (dry eyes).     . potassium chloride SA (KLOR-CON) 20 MEQ tablet TAKE ONE TABLET BY MOUTH DAILY. (Patient taking differently: Take 20 mEq by mouth daily. ) 30 tablet 6  . PROAIR HFA 108 (90 Base) MCG/ACT inhaler INHALE TWO PUFFS BY MOUTH EVERY 6 HOURS AS NEEDED FOR WHEEZING AND SHORTNESS OF BREATH (Patient taking differently: Inhale 2 puffs into the  lungs every 6 (six) hours as needed. ) 8.5 g 3  . silver sulfADIAZINE (SILVADENE) 1 % cream Apply 1 application topically daily.     Marland Kitchen losartan (COZAAR) 100 MG tablet Take 1 tablet (100 mg total) by mouth daily. 90 tablet 3   No current facility-administered medications on file prior to visit.    Allergies:   Allergies  Allergen Reactions  . Bactrim [Sulfamethoxazole-Trimethoprim] Other (See Comments)    Unknown   . Ciprofloxacin Other (See Comments)    GI upset  . Hydroxyzine Hcl     Other reaction(s): Other (See Comments) Pt becomes physchotic.  Marland Kitchen Keflet [Cephalexin] Other (See Comments)    GI upset  . Lipitor [Atorvastatin]     Muscle cramps  . Lisinopril Cough  . Spironolactone Nausea Only  . Vistaril [Hydroxyzine Hcl] Other (See Comments)    Pt becomes physchotic.  Marland Kitchen Erythromycin Rash  . Latex Rash  . Penicillins Rash    Has patient had a PCN reaction causing immediate rash, facial/tongue/throat swelling, SOB or lightheadedness with hypotension: Yes Has patient had a PCN reaction causing severe rash involving mucus membranes or skin necrosis: No Has patient had a PCN reaction that required hospitalization No Has patient had a PCN reaction occurring within the last 10 years: No If all of the above answers  are "NO", then may proceed with Cephalosporin use.   . Tape Itching, Rash and Other (See Comments)    Burning  . Tetracycline Rash    Physical Exam  Vitals:   10/19/19 1439  Weight: (!) 248 lb (112.5 kg)  Height: 4\' 11"  (1.499 m)   Body mass index is 50.09 kg/m. No exam data present  General: elderly caucasian obese female, obese, seated, in no evident distress Head: head normocephalic and atraumatic.   Neck: supple with no carotid or supraclavicular bruits Cardiovascular: regular rate and rhythm, no murmurs Musculoskeletal: no deformity Skin:  no rash/petichiae Vascular:  Normal pulses all extremities  Neurologic Exam Mental Status: Awake and fully  alert. Oriented to place and time. Remote memory intact. Attention span, concentration and fund of knowledge appropriate. Mood and affect appropriate.  Recall 3/3.  Able to name only 9 animals which can walk on 4 legs.  Clock drawing 4/4. Cranial Nerves: Pupils equal, briskly reactive to light. Extraocular movements full without nystagmus. Visual fields full to confrontation. Hearing intact. Facial sensation intact. Face, tongue, palate moves normally and symmetrically.  Motor: Normal bulk and tone. Normal strength in all tested extremity muscles. Sensory: intact to touch , pinprick , position and vibratory sensation.  Coordination: Rapid alternating movements normal in all extremities. Finger-to-nose and heel-to-shin performed accurately bilaterally. Gait and Station: Arises from chair with mild  difficulty. Stance is slightly broad-based gait demonstrates broad-based gait with most wheeled walker Reflexes: 1+ and symmetric. Toes downgoing.       ASSESSMENT: 74 y.o. year old female here with right parietal infarct and subacute left parietal infarct on 01/18/2017 secondary to embolis due to atrial fibrillation on aspirin 81mg . Vascular risk factors are HTN, HLD, obesity, sleep apnea and atrial fibrillation.  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   PLAN:  I had a long discussion with the patient and her husband regarding her recent embolic strokes as well as mild posterior cognitive impairment and answered questions.  I recommend she continue Eliquis for stroke prevention and maintain aggressive risk factor modification with strict control of hypertension with blood pressure goal below 130/90, lipids with LDL cholesterol goal below 70 mg percent and diabetes with hemoglobin A1c goal below 6.5%.  I recommend we check follow-up lipid profile, hemoglobin A1c and carotid ultrasound.   We also discussed some mild cognitive impairment and recommend she increase participation in cognitively challenging activities like solving crossword puzzle, playing bridge and sodoku.  We also discussed memory compensation strategies. Greater than 50% of time during this 35  minute visit was spent on counseling,explanation of diagnosis embolic stroke and mild cognitive impairment, planning of further management of recent stroke, atrial fibrillation, HLD, HTN, and weight loss. She will return for follow-up in the future in a year or call earlier if necessary.     Antony Contras, MD  [facility] [address] [place], [place] [address]  Phone [phone] Fax [phone] Note: This document was prepared with digital dictation and possible smart phrase technology. Any transcriptional errors that result from this process are unintentional.

## 2019-10-19 NOTE — Patient Instructions (Signed)
I had a long discussion with the patient and her husband regarding her recent embolic strokes as well as mild posterior cognitive impairment and answered questions.  I recommend she continue Eliquis for stroke prevention and maintain aggressive risk factor modification with strict control of hypertension with blood pressure goal below 130/90, lipids with LDL cholesterol goal below 70 mg percent and diabetes with hemoglobin A1c goal below 6.5%.  I recommend we check follow-up lipid profile, hemoglobin A1c and carotid ultrasound.  We also discussed some mild cognitive impairment and recommend she increase participation in cognitively challenging activities like solving crossword puzzle, playing bridge and sodoku.  We also discussed memory compensation strategies.  She will return for follow-up in the future in a year or call earlier if necessary. Memory Compensation Strategies  1. Use "WARM" strategy.  W= write it down  A= associate it  R= repeat it  M= make a mental note  2.   You can keep a Social worker.  Use a 3-ring notebook with sections for the following: calendar, important names and phone numbers,  medications, doctors' names/phone numbers, lists/reminders, and a section to journal what you did  each day.   3.    Use a calendar to write appointments down.  4.    Write yourself a schedule for the day.  This can be placed on the calendar or in a separate section of the Memory Notebook.  Keeping a  regular schedule can help memory.  5.    Use medication organizer with sections for each day or morning/evening pills.  You may need help loading it  6.    Keep a basket, or pegboard by the door.  Place items that you need to take out with you in the basket or on the pegboard.  You may also want to  include a message board for reminders.  7.    Use sticky notes.  Place sticky notes with reminders in a place where the task is performed.  For example: " turn off the  stove" placed by the stove,  "lock the door" placed on the door at eye level, " take your medications" on  the bathroom mirror or by the place where you normally take your medications.  8.    Use alarms/timers.  Use while cooking to remind yourself to check on food or as a reminder to take your medicine, or as a  reminder to make a call, or as a reminder to perform another task, etc.

## 2019-10-20 LAB — LIPID PANEL
Chol/HDL Ratio: 4.3 ratio (ref 0.0–4.4)
Cholesterol, Total: 165 mg/dL (ref 100–199)
HDL: 38 mg/dL — ABNORMAL LOW (ref >39)
LDL Chol Calc (NIH): 100 mg/dL — ABNORMAL HIGH (ref 0–99)
Triglycerides: 153 mg/dL — ABNORMAL HIGH (ref 0–149)
VLDL Cholesterol Cal: 27 mg/dL (ref 5–40)

## 2019-10-20 LAB — HEMOGLOBIN A1C
Est. average glucose Bld gHb Est-mCnc: 131 mg/dL
Hgb A1c MFr Bld: 6.2 % — ABNORMAL HIGH (ref 4.8–5.6)

## 2019-10-26 ENCOUNTER — Ambulatory Visit: Payer: BC Managed Care – PPO | Admitting: Cardiology

## 2019-10-30 ENCOUNTER — Telehealth: Payer: Self-pay | Admitting: Neurology

## 2019-10-30 ENCOUNTER — Telehealth: Payer: Self-pay

## 2019-10-30 ENCOUNTER — Other Ambulatory Visit: Payer: Self-pay | Admitting: Neurology

## 2019-10-30 ENCOUNTER — Telehealth: Payer: Self-pay | Admitting: *Deleted

## 2019-10-30 DIAGNOSIS — N950 Postmenopausal bleeding: Secondary | ICD-10-CM

## 2019-10-30 DIAGNOSIS — N8501 Benign endometrial hyperplasia: Secondary | ICD-10-CM

## 2019-10-30 MED ORDER — ROSUVASTATIN CALCIUM 5 MG PO TABS
5.0000 mg | ORAL_TABLET | Freq: Every day | ORAL | 11 refills | Status: DC
Start: 2019-10-30 — End: 2020-02-27

## 2019-10-30 NOTE — Telephone Encounter (Signed)
Spoke with patient and advised her of Dr Clydene Fake result note. I advised hs can pick up new Rx. She asked for A1C result; I gave it to her. Patient verbalized understanding, appreciation.

## 2019-10-30 NOTE — Telephone Encounter (Signed)
Post op x 8.5 weeks from [facility] with Myosure and EMB, 08/30/19 EMB: simple and focal complex hyperplasia H/o afib, on eliquis H/o stroke x 2  Spoke with pt. Pt states having vaginal bleeding again x 2 days. Pt states started out as spotting at end of week and then heavy bright red bleeding for last 2 days. Pt states changing pad every 2-3 hours and feeling weak/dizzy " at times with some nausea" . Pt also states having small clots that started with heavy bleeding. States stopped using Megace Rx due to insomnia x 1 month now.   Pt states she is ready to be seen by gyn/onc that Dr Sabra Heck spoke of to have consult for lap hysterectomy. Dr Berline Lopes referral placed.   Pt advised to be seen for further evaluation now for active bleeding. Pt aware that Dr Sabra Heck not in office this week and advised to see other provider. Pt agreeable. Pt states lives in [place] and not available to see anyone until Thursday or Friday this week, declines earlier appts offered.  Pt scheduled with Dr [name] on 8/13 at 1030 am. Pt agreeable. Pt advised and encouraged to be seen before by PCP or call back to be seen here as work-in. Pt declines at this time. ER precautions given. Pt agreeable.   Routing to Dr [name] for review.  Cc: Dr Sabra Heck

## 2019-10-30 NOTE — Telephone Encounter (Signed)
Wife is asking for a call with the results to blood work on 07-29

## 2019-10-30 NOTE — Telephone Encounter (Signed)
I do agree with office evaluation and ER visit if her bleeding is uncontrolled.

## 2019-10-30 NOTE — Telephone Encounter (Signed)
Patient is calling in regards to "bleeding after having DNC done."

## 2019-10-30 NOTE — Telephone Encounter (Signed)
Called the patient and scheduled her for a new patient appt on 8/16. Gave the address and phone number for the clinic.

## 2019-10-30 NOTE — Telephone Encounter (Signed)
Kindly inform the patient that screening blood work for diabetes was satisfactory but bad cholesterol was not in given history of multiple strokes patient needs to take Crestor 5 mg daily.  I will prescribe

## 2019-10-31 ENCOUNTER — Encounter: Payer: Self-pay | Admitting: Cardiology

## 2019-10-31 ENCOUNTER — Other Ambulatory Visit (HOSPITAL_COMMUNITY)
Admission: RE | Admit: 2019-10-31 | Discharge: 2019-10-31 | Disposition: A | Discharge status: Home / Self Care | Payer: BC Managed Care – PPO | Source: Ambulatory Visit | Attending: Cardiology | Admitting: Cardiology

## 2019-10-31 ENCOUNTER — Ambulatory Visit (INDEPENDENT_AMBULATORY_CARE_PROVIDER_SITE_OTHER): Payer: BC Managed Care – PPO | Admitting: Cardiology

## 2019-10-31 VITALS — BP 118/80 | HR 86 | Ht 59.5 in | Wt 255.0 lb

## 2019-10-31 DIAGNOSIS — E782 Mixed hyperlipidemia: Secondary | ICD-10-CM | POA: Diagnosis not present

## 2019-10-31 DIAGNOSIS — I480 Paroxysmal atrial fibrillation: Secondary | ICD-10-CM | POA: Insufficient documentation

## 2019-10-31 DIAGNOSIS — I10 Essential (primary) hypertension: Secondary | ICD-10-CM

## 2019-10-31 DIAGNOSIS — Z79899 Other long term (current) drug therapy: Secondary | ICD-10-CM | POA: Diagnosis not present

## 2019-10-31 LAB — CBC
HCT: 41.1 % (ref 36.0–46.0)
Hemoglobin: 12.8 g/dL (ref 12.0–15.0)
MCH: 27.6 pg (ref 26.0–34.0)
MCHC: 31.1 g/dL (ref 30.0–36.0)
MCV: 88.8 fL (ref 80.0–100.0)
Platelets: 322 10*3/uL (ref 150–400)
RBC: 4.63 MIL/uL (ref 3.87–5.11)
RDW: 14.4 % (ref 11.5–15.5)
WBC: 11.8 10*3/uL — ABNORMAL HIGH (ref 4.0–10.5)
nRBC: 0 % (ref 0.0–0.2)

## 2019-10-31 NOTE — Patient Instructions (Signed)
Your physician recommends that you schedule a follow-up appointment in: [place] has recommended you make the following change in your medication:   HOLD ELIQUIS UNTIL Friday AND CALL us WITH AN UPDATE ON YOUR BLEEDING   Your physician recommends that you return for lab work in: [facility]  Thank you for choosing [facility]!!

## 2019-10-31 NOTE — Progress Notes (Signed)
Clinical Summary Ms. Toutant is a 74 y.o.female  seen today for follow up of the following medical problems.  1. Afib - new diagnosis during ER visit 09/14/16   - no recent palpitations - compliant with meds - can have some hemorridal bleeding, first episode today in some time. Followed by GI.   - some palpitations at times, though infrequent - recurrent issues with vaginal bleeding. Had recent gyn procedure for bleeding but has reoccurred - soaked through 8 pads yesterday alone, remaisn on eliqiuis  2. Hyperlipidemia - neurologist lowered statin, now off - muscle cramps/body [place] atorvastatin - she has not been willing to try alternative statin  - 01/2018 TC 186 TG 112 HDL 45 LDL 119  09/2019 TC 165 TG 153 LDL 100 - neurology just started crestor 5mg .   3. OSA - uses CPAP machine, followed by Dr Annamaria Boots.    4. HTN - compliant with meds  4. 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 She had refused anticoag prior to that. - now on eliquis.  5. CAD - 12/2016 coronary CTA: coronary calcium score of 7, mild diffuse nonobstructive disease - elevated trop 12/2016 during admit with cardioembolic stroke, likely embolized to coronaries as well   6. Pulmonary nodules - followed by pulm  7. Vaginal bleeding - followed OB/gyn - recurrent even after recent D&C Past Medical History:  Diagnosis Date  . Allergy   . Anxiety    xanax  . Arthritis    shoulders/hips - tx with OTC meds  . Breast cyst 03/1990   pt denies 12/05/13  . Bronchitis   . Cataract   . Cervical tab 1972  . 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 Diabetes mellitus without complication ([place])    stated not diabetic but her AIC has been high so she is monitored closely   . Dyspnea   . Dysrhythmia   . Endometrial polyp 04/2000  . Fall 01/2018   nose fracture   . Heart murmur   . Hemorrhoids   . Hypertension   . Kidney stones   . Obesity   . Osteopenia  05/2007  . Paroxysmal atrial fibrillation (HCC)   . Peripheral neuropathy   . PMB (postmenopausal bleeding) 04/2000  . SAB (spontaneous abortion) 1981  . Sleep apnea    CPAP  . Stroke ([place])   . SVD (spontaneous vaginal delivery) 04/1964, 05/1978   x3  . TIA (transient ischemic attack)      Allergies  Allergen Reactions  . Bactrim [Sulfamethoxazole-Trimethoprim] Other (See Comments)    Unknown   . Ciprofloxacin Other (See Comments)    GI upset  . Hydroxyzine Hcl     Other reaction(s): Other (See Comments) Pt becomes physchotic.  Marland Kitchen Keflet [Cephalexin] Other (See Comments)    GI upset  . Lipitor [Atorvastatin]     Muscle cramps  . Lisinopril Cough  . Spironolactone Nausea Only  . Vistaril [Hydroxyzine Hcl] Other (See Comments)    Pt becomes physchotic.  Marland Kitchen Erythromycin Rash  . Latex Rash  . Penicillins Rash    Has patient had a PCN reaction causing immediate rash, facial/tongue/throat swelling, SOB or lightheadedness with hypotension: Yes Has patient had a PCN reaction causing severe rash involving mucus membranes or skin necrosis: No Has patient had a PCN reaction that required hospitalization No Has patient had a PCN reaction occurring within the last 10 years: No If all of the above answers are "NO", then may proceed with Cephalosporin use.   Marland Kitchen  Tape Itching, Rash and Other (See Comments)    Burning  . Tetracycline Rash     Current Outpatient Medications  Medication Sig Dispense Refill  . acetaminophen (TYLENOL) 500 MG tablet Take 1,000 mg by mouth every 6 (six) hours as needed for moderate pain or headache.     . ALPRAZolam (XANAX) 0.25 MG tablet Take 0.25 mg by mouth daily as needed for anxiety.     . chlorthalidone (HYGROTON) 25 MG tablet Take 25 mg by mouth daily.    . cholecalciferol (VITAMIN D) 1000 UNITS tablet Take 1,000 Units by mouth every morning.     Marland Kitchen ELIQUIS 5 MG TABS tablet TAKE ONE TABLET BY MOUTH TWICE DAILY. (Patient taking differently: Take 5 mg by  mouth 2 (two) times daily. ) 60 tablet 6  . guaiFENesin (MUCINEX) 600 MG 12 hr tablet Take 600 mg by mouth daily as needed for cough.    . losartan (COZAAR) 100 MG tablet Take 1 tablet (100 mg total) by mouth daily. 90 tablet 3  . megestrol (MEGACE) 40 MG tablet Take 1 tablet (40 mg total) by mouth daily. 30 tablet 3  . metoprolol tartrate (LOPRESSOR) 25 MG tablet TAKE ONE TABLET BY MOUTH TWICE DAILY, MAY TAKE  EXTRA TABLET AS NEEDED FOR PALPITATIONS. (Patient taking differently: Take 25 mg by mouth 2 (two) times daily. ) 270 tablet 3  . Multiple Vitamins-Minerals (MULTIVITAMIN WITH MINERALS) tablet Take 1 tablet by mouth daily.     Vladimir Faster Glycol-Propyl Glycol (SYSTANE OP) Place 1 drop into both eyes daily as needed (dry eyes).     . potassium chloride SA (KLOR-CON) 20 MEQ tablet TAKE ONE TABLET BY MOUTH DAILY. (Patient taking differently: Take 20 mEq by mouth daily. ) 30 tablet 6  . PROAIR HFA 108 (90 Base) MCG/ACT inhaler INHALE TWO PUFFS BY MOUTH EVERY 6 HOURS AS NEEDED FOR WHEEZING AND SHORTNESS OF BREATH (Patient taking differently: Inhale 2 puffs into the lungs every 6 (six) hours as needed. ) 8.5 g 3  . rosuvastatin (CRESTOR) 5 MG tablet Take 1 tablet (5 mg total) by mouth daily. 30 tablet 11  . silver sulfADIAZINE (SILVADENE) 1 % cream Apply 1 application topically daily.      No current facility-administered medications for this visit.     Past Surgical History:  Procedure Laterality Date  . CATARACT EXTRACTION Right 12/07/2018  . CERVICAL DISC SURGERY  2/93  . CESAREAN SECTION  6/83   with BTL  . COLONOSCOPY N/A 03/19/2017   Procedure: COLONOSCOPY;  Surgeon: Doran Stabler, MD;  Location: [place];  Service: Gastroenterology;  Laterality: N/A;  . DILATION AND CURETTAGE OF UTERUS    . HYSTEROSCOPY  5/02, 01/2011  . JOINT REPLACEMENT    . LITHOTRIPSY  08/30/2014   with Dr. Jeffie Poll[facility] at [facility]  . TOTAL KNEE ARTHROPLASTY Right 6/08   right  . TOTAL KNEE ARTHROPLASTY  Left 4/10   left   . TUBAL LIGATION    . WRIST SURGERY Left 4/07   left     Allergies  Allergen Reactions  . Bactrim [Sulfamethoxazole-Trimethoprim] Other (See Comments)    Unknown   . Ciprofloxacin Other (See Comments)    GI upset  . Hydroxyzine Hcl     Other reaction(s): Other (See Comments) Pt becomes physchotic.  Marland Kitchen Keflet [Cephalexin] Other (See Comments)    GI upset  . Lipitor [Atorvastatin]     Muscle cramps  . Lisinopril Cough  . Spironolactone Nausea Only  .  Vistaril [Hydroxyzine Hcl] Other (See Comments)    Pt becomes physchotic.  Marland Kitchen Erythromycin Rash  . Latex Rash  . Penicillins Rash    Has patient had a PCN reaction causing immediate rash, facial/tongue/throat swelling, SOB or lightheadedness with hypotension: Yes Has patient had a PCN reaction causing severe rash involving mucus membranes or skin necrosis: No Has patient had a PCN reaction that required hospitalization No Has patient had a PCN reaction occurring within the last 10 years: No If all of the above answers are "NO", then may proceed with Cephalosporin use.   . Tape Itching, Rash and Other (See Comments)    Burning  . Tetracycline Rash      Family History  Problem Relation Age of Onset  . Heart disease Father   . Colon cancer Mother   . Heart disease Mother   . Breast cancer Maternal Aunt   . Colon cancer Maternal Aunt   . Colon cancer Maternal Uncle   . Ehlers-Danlos syndrome Other        neice-being tested 4/15  . Stomach cancer Neg Hx   . Pancreatic cancer Neg Hx      Social History Ms. Antone reports that she has never smoked. She has never used smokeless tobacco. Ms. Null reports no history of alcohol use.   Review of Systems CONSTITUTIONAL: No weight loss, fever, chills, weakness or fatigue.  HEENT: Eyes: No visual loss, blurred vision, double vision or yellow sclerae.No hearing loss, sneezing, congestion, runny nose or sore throat.  SKIN: No rash or itching.    CARDIOVASCULAR: per hpi RESPIRATORY: No shortness of breath, cough or sputum.  GASTROINTESTINAL: No anorexia, nausea, vomiting or diarrhea. No abdominal pain or blood.  GENITOURINARY: No burning on urination, no polyuria NEUROLOGICAL: No headache, dizziness, syncope, paralysis, ataxia, numbness or tingling in the extremities. No change in bowel or bladder control.  MUSCULOSKELETAL: No muscle, back pain, joint pain or stiffness.  LYMPHATICS: No enlarged nodes. No history of splenectomy.  PSYCHIATRIC: No history of depression or anxiety.  ENDOCRINOLOGIC: No reports of sweating, cold or heat intolerance. No polyuria or polydipsia.  Marland Kitchen   Physical Examination Vitals:   10/31/19 1055  BP: 118/80  Pulse: 86  SpO2: 98%   Filed Weights   10/31/19 1055  Weight: 255 lb (115.7 kg)    Gen: resting comfortably, no acute distress HEENT: no scleral icterus, pupils equal round and reactive, no palptable cervical adenopathy,  CV: irreg, no m/r/g, no jvd Resp: Clear to auscultation bilaterally GI: abdomen is soft, non-tender, non-distended, normal bowel sounds, no hepatosplenomegaly MSK: extremities are warm, no edema.  Skin: warm, no rash Neuro:  no focal deficits Psych: appropriate affect   Diagnostic Studies  09/2016 echo Study Conclusions  - Left ventricle: The cavity size was normal. Wall thickness was increased in a pattern of mild LVH. Systolic function was normal. The estimated ejection fraction was in the range of 60% to 65%. Wall motion was normal; there were no regional wall motion abnormalities. Features are consistent with a pseudonormal left ventricular filling pattern, with concomitant abnormal relaxation and increased filling pressure (grade 2 diastolic dysfunction). - Aortic valve: Mildly calcified annulus. Trileaflet. There was trivial regurgitation. - Mitral valve: Moderately calcified annulus. The findings are consistent with mild stenosis. There  was mild regurgitation. Mean gradient (D): 3 mm Hg. Valve area by pressure half-time: 1.54 cm^2. - Left atrium: The atrium was moderately dilated. - Right atrium: Central venous pressure (est): 8 mm Hg. - Tricuspid valve:  There was trivial regurgitation. - Pulmonary arteries: Systolic pressure could not be accurately estimated. - Pericardium, extracardiac: There was no pericardial effusion.  Impressions:  - Mild LVH with LVEF 60-65% and grade 2 diastolic dysfunction. Moderate left atrial enlargement. Moderately calcified mitral annulus with evidence of mild mitral regurgitation and mild mitral stenosis. Sclerotic aortic valve with trivial aortic regurgitation. Trivial tricuspid regurgitation.   Assessment and Plan  1. PAF -infrequent palpitations, continue current meds - recurrent very heavy vaginal bleeding, she has gyn f/u on Friday - hold eliquis for now, try to limit duration of holding due to her prior history of CVA.  - check CBC  2.HTN - at goal, continue curren tmeds  3. Hyperlipidemia - muscle aches on atorvastatinResistant to trying other statins - neuro has recommended crestor 5mg , she has Rx but has not filled yet, not sure she wants to try. Encouraged on benefits of statin for her, particularly given her prior CVA history.    F/u 4 months   Arnoldo Lenis, M.D.,

## 2019-11-01 DIAGNOSIS — L03032 Cellulitis of left toe: Secondary | ICD-10-CM | POA: Diagnosis not present

## 2019-11-01 DIAGNOSIS — L600 Ingrowing nail: Secondary | ICD-10-CM | POA: Diagnosis not present

## 2019-11-01 DIAGNOSIS — M79675 Pain in left toe(s): Secondary | ICD-10-CM | POA: Diagnosis not present

## 2019-11-02 ENCOUNTER — Other Ambulatory Visit: Payer: Self-pay

## 2019-11-03 ENCOUNTER — Other Ambulatory Visit: Payer: Self-pay

## 2019-11-03 ENCOUNTER — Encounter: Payer: Self-pay | Admitting: Obstetrics and Gynecology

## 2019-11-03 ENCOUNTER — Ambulatory Visit (INDEPENDENT_AMBULATORY_CARE_PROVIDER_SITE_OTHER): Payer: BC Managed Care – PPO | Admitting: Obstetrics and Gynecology

## 2019-11-03 VITALS — BP 128/76 | HR 72 | Resp 16 | Ht 59.0 in | Wt 255.0 lb

## 2019-11-03 DIAGNOSIS — N8501 Benign endometrial hyperplasia: Secondary | ICD-10-CM | POA: Diagnosis not present

## 2019-11-03 DIAGNOSIS — N950 Postmenopausal bleeding: Secondary | ICD-10-CM

## 2019-11-03 LAB — CBC
Hematocrit: 38 % (ref 34.0–46.6)
Hemoglobin: 12.3 g/dL (ref 11.1–15.9)
MCH: 27.6 pg (ref 26.6–33.0)
MCHC: 32.4 g/dL (ref 31.5–35.7)
MCV: 85 fL (ref 79–97)
Platelets: 333 10*3/uL (ref 150–450)
RBC: 4.45 x10E6/uL (ref 3.77–5.28)
RDW: 15.2 % (ref 11.7–15.4)
WBC: 13.1 10*3/uL — ABNORMAL HIGH (ref 3.4–10.8)

## 2019-11-03 NOTE — Progress Notes (Signed)
GYNECOLOGY  VISIT   HPI: 74 y.o.   Married  Caucasian  female   [phone] with Patient's last menstrual period was 01/22/2011.   here for bleeding after surgery and hot flashes.   Patient is usually seen by Dr. Sabra Heck, who is out of the office this week.  Has complex endometrial hyperplasia dx 08/30/19 at [facility] Xxx at time of exam under anesthesia and endometrial biopsy done 08/30/19.    She will see GYN ONC on 11/06/19.   Bleeding for 10 days.  Using 8 pads a day.   Has atrial fibrillation and stroke.  On Eliquis.  Saw cardiology this week.  She was told to stop the Eliquis.  Stopped Tuesday evening and restarted today, Friday, per cardiology instructions.  Her bleeding has decreased. She has changed her pad only once today.  Some cramping.   CBC on 10/31/19:  hgb 12.8, WBC 11.8, platelets 322,000.  She has Aygestin and Megace at home.  She is hesitant to take them due to concerns for her cardiovascular health. She did take Megace but stopped caused her to have insomnia.  GYNECOLOGIC HISTORY: Patient's last menstrual period was 01/22/2011. Contraception:  BTL Menopausal hormone therapy:  none Last mammogram:  10/02/16 BIRADS1:neg  Last pap smear:   05/16/18 Neg        OB History    Gravida  5   Para  3   Term      Preterm      AB  2   Living  3     SAB  1   TAB  1   Ectopic      Multiple      Live Births                 Patient Active Problem List   Diagnosis Date Noted  . Acute ischemic stroke ([place]) 02/08/2019  . Acute CVA (cerebrovascular accident) ([place]) 02/07/2019  . Coagulopathy ([place]) 02/07/2019  . Anticoagulated 02/07/2019  . Visual changes 02/07/2019  . Leukocytosis 02/07/2019  . Hyperglycemia 02/07/2019  . Diabetes mellitus without complication ([place])   . Hypokalemia 03/19/2017  . HLD (hyperlipidemia) 03/19/2017  . Hematochezia   . Acute blood loss anemia   . Rectal bleeding 03/18/2017  . Acute GI bleeding   . Posterior  circulation stroke ([place]) 02/12/2017  . Stroke (cerebrum) ([place]) 01/18/2017  . Intracerebral hemorrhage 01/18/2017  . Elevated troponin 01/17/2017  . A-fib ([place]) 10/27/2016  . Morbid (severe) obesity due to excess calories ([place]) 06/20/2015  . Nephrolithiasis 07/09/2014  . Asthma with bronchitis 02/23/2014  . Aphasia 02/27/2013  . ACUTE BRONCHITIS 08/30/2009  . Essential hypertension 02/22/2007  . Obstructive sleep apnea (adult) (pediatric) 02/22/2007  . ALLERGY 02/22/2007    Past Medical History:  Diagnosis Date  . Allergy   . Anxiety    xanax  . Arthritis    shoulders/hips - tx with OTC meds  . Breast cyst 03/1990   pt denies 12/05/13  . Bronchitis   . Cataract   . Cervical tab 1972  . 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 Diabetes mellitus without complication ([place])    stated not diabetic but her AIC has been high so she is monitored closely   . Dyspnea   . Dysrhythmia   . Endometrial polyp 04/2000  . Fall 01/2018   nose fracture   . Heart murmur   . Hemorrhoids   . Hypertension   . Kidney stones   . Obesity   .  Osteopenia 05/2007  . Paroxysmal atrial fibrillation (HCC)   . Peripheral neuropathy   . PMB (postmenopausal bleeding) 04/2000  . SAB (spontaneous abortion) 1981  . Sleep apnea    CPAP  . Stroke ([place])   . SVD (spontaneous vaginal delivery) 04/1964, 05/1978   x3  . TIA (transient ischemic attack)     Past Surgical History:  Procedure Laterality Date  . CATARACT EXTRACTION Right 12/07/2018  . CERVICAL DISC SURGERY  2/93  . CESAREAN SECTION  6/83   with BTL  . COLONOSCOPY N/A 03/19/2017   Procedure: COLONOSCOPY;  Surgeon: Doran Stabler, MD;  Location: [place];  Service: Gastroenterology;  Laterality: N/A;  . DILATION AND CURETTAGE OF UTERUS    . HYSTEROSCOPY  5/02, 01/2011  . JOINT REPLACEMENT    . LITHOTRIPSY  08/30/2014   with [facility] Jeff[facility] Poll[facility]  . TOTAL KNEE ARTHROPLASTY Right 6/08   right  . TOTAL KNEE ARTHROPLASTY  Left 4/10   left   . TUBAL LIGATION    . WRIST SURGERY Left 4/07   left    Current Outpatient Medications  Medication Sig Dispense Refill  . acetaminophen (TYLENOL) 500 MG tablet Take 1,000 mg by mouth every 6 (six) hours as needed for moderate pain or headache.     . ALPRAZolam (XANAX) 0.25 MG tablet Take 0.25 mg by mouth daily as needed for anxiety.     . chlorthalidone (HYGROTON) 25 MG tablet Take 25 mg by mouth daily.    . cholecalciferol (VITAMIN D) 1000 UNITS tablet Take 1,000 Units by mouth every morning.     Marland Kitchen ELIQUIS 5 MG TABS tablet TAKE ONE TABLET BY MOUTH TWICE DAILY. (Patient taking differently: Take 5 mg by mouth 2 (two) times daily. ) 60 tablet 6  . guaiFENesin (MUCINEX) 600 MG 12 hr tablet Take 600 mg by mouth daily as needed for cough.    . losartan (COZAAR) 100 MG tablet Take 1 tablet (100 mg total) by mouth daily. 90 tablet 3  . metoprolol tartrate (LOPRESSOR) 25 MG tablet TAKE ONE TABLET BY MOUTH TWICE DAILY, MAY TAKE  EXTRA TABLET AS NEEDED FOR PALPITATIONS. (Patient taking differently: Take 25 mg by mouth 2 (two) times daily. ) 270 tablet 3  . Multiple Vitamins-Minerals (MULTIVITAMIN WITH MINERALS) tablet Take 1 tablet by mouth daily.     Vladimir Faster Glycol-Propyl Glycol (SYSTANE OP) Place 1 drop into both eyes daily as needed (dry eyes).     . potassium chloride SA (KLOR-CON) 20 MEQ tablet TAKE ONE TABLET BY MOUTH DAILY. (Patient taking differently: Take 20 mEq by mouth daily. ) 30 tablet 6  . PROAIR HFA 108 (90 Base) MCG/ACT inhaler INHALE TWO PUFFS BY MOUTH EVERY 6 HOURS AS NEEDED FOR WHEEZING AND SHORTNESS OF BREATH (Patient taking differently: Inhale 2 puffs into the lungs every 6 (six) hours as needed. ) 8.5 g 3  . silver sulfADIAZINE (SILVADENE) 1 % cream Apply 1 application topically daily.     . rosuvastatin (CRESTOR) 5 MG tablet Take 1 tablet (5 mg total) by mouth daily. (Patient not taking: Reported on 11/03/2019) 30 tablet 11   No current facility-administered  medications for this visit.     ALLERGIES: Bactrim [sulfamethoxazole-trimethoprim], Ciprofloxacin, Hydroxyzine hcl, Keflet [cephalexin], Lipitor [atorvastatin], Lisinopril, Spironolactone, Vistaril [hydroxyzine hcl], Erythromycin, Latex, Penicillins, Tape, and Tetracycline  Family History  Problem Relation Age of Onset  . Heart disease Father   . Colon cancer Mother   . Heart disease Mother   .  Breast cancer Maternal Aunt   . Colon cancer Maternal Aunt   . Colon cancer Maternal Uncle   . Ehlers-Danlos syndrome Other        neice-being tested 4/15  . Stomach cancer Neg Hx   . Pancreatic cancer Neg Hx     Social History   Socioeconomic History  . Marital status: Married    Spouse name: [name]  . Number of children: 3  . Years of education: college  . Highest education level: Not on file  Occupational History  . 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 Smoking status: Never Smoker  . Smokeless tobacco: Never Used  Vaping Use  . Vaping Use: Never used  Substance and Sexual Activity  . Alcohol use: No  . Drug use: No  . Sexual activity: Not Currently    Partners: Male    Birth control/protection: Surgical    Comment: BTL  Other Topics Concern  . Not on file  Social History Narrative   Patient is married [name])   Patient has three children.   Patient is a homemaker.   Education- College   Right handed.   Caffeine- one coke cola          Social Determinants of Health   Financial Resource Strain:   . Difficulty of Paying Living Expenses:   Food Insecurity:   . Worried About Charity fundraiser in the Last Year:   . Arboriculturist in the Last Year:   Transportation Needs:   . Film/video editor (Medical):   Marland Kitchen Lack of Transportation (Non-Medical):   Physical Activity:   . Days of Exercise per Week:   . Minutes of Exercise per Session:   Stress:   . Feeling of Stress :   Social Connections:   . Frequency  of Communication with Friends and Family:   . Frequency of Social Gatherings with Friends and Family:   . Attends Religious Services:   . Active Member of Clubs or Organizations:   . Attends Archivist Meetings:   Marland Kitchen Marital Status:   Intimate Partner Violence:   . Fear of Current or Ex-Partner:   . Emotionally Abused:   Marland Kitchen Physically Abused:   . Sexually Abused:     Review of Systems  Constitutional:       Hot flashes  HENT: Negative.   Eyes: Negative.   Respiratory: Negative.   Cardiovascular: Negative.   Gastrointestinal: Negative.   Endocrine: Negative.   Genitourinary: Positive for vaginal bleeding.  Musculoskeletal: Negative.   Skin: Negative.   Allergic/Immunologic: Negative.   Neurological: Negative.   Hematological: Negative.   Psychiatric/Behavioral: Positive for dysphoric mood.    PHYSICAL EXAMINATION:    BP 128/76 (BP Location: Right Arm, Patient Position: Sitting, Cuff Size: Large)   Pulse 72   Resp 16   Ht 4\' 11"  (1.499 m)   Wt 255 lb (115.7 kg)   LMP 01/22/2011   BMI 51.50 kg/m     General appearance: alert, cooperative and appears stated age   Pelvic: External genitalia:  no lesions              Urethra:  normal appearing urethra with no masses, tenderness or lesions              Bartholins and Skenes: normal                 Vagina: normal appearing  vagina with normal color and discharge, no lesions              Cervix: anterior lip seen.  Moderate blood in the vaginal.  No active trickle noted.                Bimanual Exam:  Uterus:  normal size, contour, position, consistency, mobility, non-tender              Adnexa: no mass, fullness, tenderness            Chaperone was present for exam.  ASSESSMENT  Complex endometrial hyperplasia.  Postmenopausal bleeding.  On Eliquis.  PLAN  Will check stat CBC now.  Anemia precautions.  She will call during the weekend if her bleeding increases again.  She has both Aygestin and Megace if  needed.  She will follow up with GYN ONC in 3 days.

## 2019-11-03 NOTE — Progress Notes (Signed)
GYNECOLOGIC ONCOLOGY NEW PATIENT CONSULTATION   Patient Name: Nicole Cabrera  Patient Age: 74 y.o. Date of Service: 11/06/19 Referring Provider: Dr. Hale Bogus  Primary Care Provider: Leeanne Rio, MD Consulting Provider: Jeral Pinch, MD   Assessment/Plan:  Postmenopausal patient with bleeding found to have [facility] without atypia.   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  We discussed the lower risk of progression to endometrial cancer in the setting of simple or complex hyperplasia without atypia.  The risk of progression is approximately 5%, although I think her baseline risk is higher than this given her obesity with a BMI of 52.  We discussed the role that excess estrogen plays in the development of hyperplasia and endometrioid endometrial cancer.  Given her multiple medical co-morbidities, she is not a suitable surgical candidate at this time, and medical management options are recommended for disease stabilization and reduction of risk in development of endometrial cancer. We reviewed the role of progesterone therapy and the effect on preneoplastic lesions, believed to include induction of apoptosis in addition to tissue sloughing during withdrawal bleeding.  Activation of the progesterone receptors is believed to lead stromal decidualization and thinning of the lining.  We reviewed the 3 most studied options, to include levonorgesterol IUD (74mcg/d), oral medorxyprogesterone acetate 10mg  daily or cyclically 96-04 days per month, or oral megesterol acetate 40-200mg  per day.    She understands that all options have few side effects, most common being infrequent edema, GI disturbances, and thromboembolic events), but that local progesterone through IUD may have a stronger effect on the endometrium with less systemic side effects.  Furthermore, studies demonstrate that up to  80-90% of women will have regression of their hyperplasia with progesterone use.    With IUD use, we anticipate that the average duration to regression is 4.5 months, with all cases anticipated to have regression by 9 months.     The patient has been resistant to oral progesterone, especially after trying Megace for a little over a week and having side effects.  We discussed the benefits of using a Mirena IUD with the hope of minimal or no side effects.  After our conversation, the patient was amenable to proceeding with IUD placement.  For monitoring, she understands that there is no recommend standard of care.  I recommend an EMB at 3-6 months following initiation of treatment.  EMBs will be performed at 3 to 6 month intervals, until multiple negative biopsy results are obtained, after which sampling frequeny may then be yearly or until new abnormal uterine bleeding develops.  If persistence or progression to [facility] is noted, then we will discuss additional agents or fitness/readiness for surgery.    We also reviewed the importance of weight loss in overall health as well as reduction in cancer risk.    Patient was [place] for an appointment next week for IUD placement in clinic.  A copy of this note was sent to the patient's referring provider.   60 minutes of total time was spent for this patient encounter, including preparation, face-to-face counseling with the patient and coordination of care, and documentation of the encounter.   Jeral Pinch, MD  Division of Gynecologic Oncology  Department of Obstetrics and Gynecology  [facility]  ___________________________________________  Chief Complaint: Chief Complaint  Patient presents with   Complex endometrial hyperplasia without atypia   PMB (postmenopausal bleeding)  History of Present Illness:  Nicole Cabrera is a 74 y.o. y.o. female who is seen in consultation at the request of Dr. 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  The patient endorses menopause at age 69.  She was evaluated in 2012 for postmenopausal bleeding and at that time underwent endometrial sampling which showed benign proliferative endometrium.  At that time she underwent a progesterone challenge and sonohysterogram showed what looks like an endometrial polyp.  Ultimately she underwent resection with no hyperplasia or malignancy noted on final pathology.  She was seen by her GYN at multiple times over the next 9 years.  She then presented to the emergency department in May of this year with vaginal bleeding.  An ultrasound at that time showed an endometrial lining of 9 mm.  She saw Dr. Sabra Heck shortly after and underwent endometrial biopsy in the office.  Given scant tissue but no hyperplasia or malignancy, decision was made to proceed with hysteroscopy and D&C.  This was performed on 6/9 -given her body habitus and difficulty reaching the cervix, sampling was performed multiple times with an endometrial [place], no hysteroscopy performed.  The patient had been given a prescription for norethindrone before her surgical procedure, which she never started.  Subsequently, she was counseled on the need for progesterone therapy and ultimately was amenable to starting Megace.  Today, the patient states that she tried Megace and took it for about 9 days.  Given difficulty with sleeping and feeling weepy after starting the medication, she discontinued it.  She then developed about 10 days of heavy bleeding (which she describes as saturating 8 pads a day).  She saw her cardiologist last Tuesday who stopped her Eliquis until Friday.  Her bleeding slowed down and given a stable hemoglobin, she was restarted on her Eliquis this past weekend.  She continues to have some bleeding which she reports is needing 2 pads a day that are not saturated when she changes them.  Her bleeding has been light since restarting her  Eliquis.  She notes some intermittent cramping.  She endorses a good appetite.  She has occasional nausea, denies any emesis.  She notes normal bowel and bladder function.  The patient lives in [place] with her husband.  Her family history is notable for colon cancer in her mother and 3 of her mother's siblings as well her maternal grandfather.    Her medical history is notable for atrial fibrillation.  She thinks she has been in A. fib a lot recently and can often feel her heart fluttering or feels palpitations when this is the case.  She has a history of two cerebrovascular events, once in 2018 and most recently in November of last year.  She describes having some shortness of breath with ambulation.  She uses a walker which she started using after she had some right hip pain and was ultimately diagnosed with a hematoma.  She was prescribed a walker for a short period of time by her orthopedic doctor and continued using it afterwards.  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   Diabetes mellitus without complication ([place])    stated not  diabetic but her AIC has been high so she is monitored closely    Dyspnea    Dysrhythmia    Endometrial polyp 04/2000   Fall 01/2018   nose fracture    Heart murmur    Hemorrhoids    Hypertension    Kidney stones    Obesity    Osteopenia 05/2007   Paroxysmal atrial fibrillation ([place])    Peripheral neuropathy    PMB (postmenopausal bleeding) 04/2000   SAB (spontaneous abortion) 1981   Sleep apnea    CPAP   Stroke ([place])    SVD (spontaneous vaginal delivery) 04/1964, 05/1978   x3   TIA (transient ischemic attack)      PAST SURGICAL HISTORY:  Past Surgical History:  Procedure Laterality  Date   CATARACT EXTRACTION Right 12/07/2018   CERVICAL Walker SURGERY  2/93   CESAREAN SECTION  6/83   with BTL   COLONOSCOPY N/A 03/19/2017   Procedure: COLONOSCOPY;  Surgeon: Doran Stabler, MD;  Location: [place];  Service: Gastroenterology;  Laterality: N/A;   DILATION AND CURETTAGE OF UTERUS     HYSTEROSCOPY  5/02, 01/2011   JOINT REPLACEMENT     LITHOTRIPSY  08/30/2014   with Dr. Jeffie Pollock at [place] Right 6/08   right   TOTAL KNEE ARTHROPLASTY Left 4/10   left    TUBAL LIGATION     WRIST SURGERY Left 4/07   left    OB/GYN HISTORY:  OB History  Gravida Para Term Preterm AB Living  5 3     2 3   SAB TAB Ectopic Multiple Live Births  1 1          # Outcome Date GA Lbr Len/2nd Weight Sex Delivery Anes PTL Lv  5 TAB           4 SAB           3 Para           2 Para           1 Para             Patient's last menstrual period was 01/22/2011.  Age at menarche: 89  Age at menopause: 42 Hx of HRT: yes Hx of STDs: no Last pap: 2020 - negative History of abnormal pap smears: no  SCREENING STUDIES:  Last mammogram: 2018  Last colonoscopy: 02/2017  MEDICATIONS: Outpatient Encounter Medications as of 11/06/2019  Medication Sig   acetaminophen (TYLENOL) 500 MG tablet Take 1,000 mg by mouth every 6 (six) hours as needed for moderate pain or headache.    ALPRAZolam (XANAX) 0.25 MG tablet Take 0.25 mg by mouth daily as needed for anxiety.    apixaban (ELIQUIS) 5 MG TABS tablet Take 1 tablet (5 mg total) by mouth 2 (two) times daily.   chlorthalidone (HYGROTON) 25 MG tablet Take 25 mg by mouth daily.   cholecalciferol (VITAMIN D) 1000 UNITS tablet Take 1,000 Units by mouth every morning.    guaiFENesin (MUCINEX) 600 MG 12 hr tablet Take 600 mg by mouth daily as needed for cough.   metoprolol tartrate (LOPRESSOR) 25 MG tablet TAKE ONE TABLET BY MOUTH TWICE DAILY, MAY TAKE  EXTRA TABLET AS NEEDED FOR PALPITATIONS. (Patient  taking differently: Take 25 mg by mouth 2 (two) times daily. )   Multiple Vitamins-Minerals (MULTIVITAMIN WITH MINERALS) tablet Take 1 tablet by mouth daily.    Polyethyl Glycol-Propyl Glycol (SYSTANE OP) Place 1 drop into  both eyes daily as needed (dry eyes).    potassium chloride SA (KLOR-CON) 20 MEQ tablet TAKE ONE TABLET BY MOUTH DAILY. (Patient taking differently: Take 20 mEq by mouth daily. )   PROAIR HFA 108 (90 Base) MCG/ACT inhaler INHALE TWO PUFFS BY MOUTH EVERY 6 HOURS AS NEEDED FOR WHEEZING AND SHORTNESS OF BREATH (Patient taking differently: Inhale 2 puffs into the lungs every 6 (six) hours as needed. )   silver sulfADIAZINE (SILVADENE) 1 % cream Apply 1 application topically daily.    losartan (COZAAR) 100 MG tablet Take 1 tablet (100 mg total) by mouth daily.   megestrol (MEGACE) 40 MG tablet Take 40 mg by mouth daily. (Patient not taking: Reported on 11/06/2019)   rosuvastatin (CRESTOR) 5 MG tablet Take 1 tablet (5 mg total) by mouth daily. (Patient not taking: Reported on 11/03/2019)   [DISCONTINUED] ELIQUIS 5 MG TABS tablet TAKE ONE TABLET BY MOUTH TWICE DAILY. (Patient taking differently: Take 5 mg by mouth 2 (two) times daily. )   [DISCONTINUED] megestrol (MEGACE) 40 MG tablet Take 1 tablet (40 mg total) by mouth daily.   No facility-administered encounter medications on file as of 11/06/2019.    ALLERGIES:  Allergies  Allergen Reactions   Bactrim [Sulfamethoxazole-Trimethoprim] Other (See Comments)    Unknown    Ciprofloxacin Other (See Comments)    GI upset   Hydroxyzine Hcl     Other reaction(s): Other (See Comments) Pt becomes physchotic.   Keflet [Cephalexin] Other (See Comments)    GI upset   Lipitor [Atorvastatin]     Muscle cramps   Lisinopril Cough   Spironolactone Nausea Only   Vistaril [Hydroxyzine Hcl] Other (See Comments)    Pt becomes physchotic.   Erythromycin Rash   Latex Rash   Penicillins Rash    Has patient had a PCN  reaction causing immediate rash, facial/tongue/throat swelling, SOB or lightheadedness with hypotension: Yes Has patient had a PCN reaction causing severe rash involving mucus membranes or skin necrosis: No Has patient had a PCN reaction that required hospitalization No Has patient had a PCN reaction occurring within the last 10 years: No If all of the above answers are "NO", then may proceed with Cephalosporin use.    Tape Itching, Rash and Other (See Comments)    Burning   Tetracycline Rash     FAMILY HISTORY:  Family History  Problem Relation Age of Onset   Heart disease Father    Colon cancer Mother    Heart disease Mother    Breast cancer Maternal Aunt    Colon cancer Maternal Aunt    Colon cancer Maternal Uncle    Ehlers-Danlos syndrome Other        neice-being tested 4/15   Stomach cancer Neg Hx    Pancreatic cancer Neg Hx    Endometrial cancer Neg Hx    Ovarian cancer Neg Hx      SOCIAL HISTORY:    Social Connections:    Frequency of Communication with Friends and Family:    Frequency of Social Gatherings with Friends and Family:    Attends Religious Services:    Active Member of Clubs or Organizations:    Attends Music therapist:    Marital Status:     REVIEW OF SYSTEMS:  + vaginal bleeding, joint pain, problem with walking, anxiety Denies appetite changes, fevers, chills, fatigue, unexplained weight changes. Denies hearing loss, neck lumps or masses, mouth sores, ringing in ears or voice changes. Denies cough or wheezing.  Denies shortness of breath. Denies chest pain or palpitations. Denies leg swelling. Denies abdominal distention, pain, blood in stools, constipation, diarrhea, nausea, vomiting, or early satiety. Denies pain with intercourse, dysuria, frequency, hematuria or incontinence. Denies hot flashes, pelvic pain, or vaginal discharge.   Denies  back pain or muscle pain/cramps. Denies itching, rash, or  wounds. Denies dizziness, headaches, numbness or seizures. Denies swollen lymph nodes or glands, denies easy bruising or bleeding. Denies depression, confusion, or decreased concentration.  Physical Exam:  Vital Signs for this encounter:  Blood pressure 124/80, pulse 85, temperature 98.7 F (37.1 C), temperature source Tympanic, resp. rate 18, height 4\' 11"  (1.499 m), weight 257 lb 4.8 oz (116.7 kg), last menstrual period 01/22/2011, SpO2 97 %. Body mass index is 51.97 kg/m. General: Alert, oriented, no acute distress.  HEENT: Normocephalic, atraumatic. Sclera anicteric.  Chest: Unlabored breathing on room air.  LABORATORY AND RADIOLOGIC DATA:  Outside medical records were reviewed to synthesize the above history, along with the history and physical obtained during the visit.   Lab Results  Component Value Date   WBC 13.1 (H) 11/03/2019   HGB 12.3 11/03/2019   HCT 38.0 11/03/2019   PLT 333 11/03/2019   GLUCOSE 121 (H) 08/29/2019   CHOL 165 10/19/2019   TRIG 153 (H) 10/19/2019   HDL 38 (L) 10/19/2019   LDLCALC 100 (H) 10/19/2019   ALT 37 08/11/2019   AST 31 08/11/2019   NA 137 08/29/2019   K 3.6 08/29/2019   CL 102 08/29/2019   CREATININE 0.68 08/29/2019   BUN 10 08/29/2019   CO2 26 08/29/2019   TSH 2.350 09/14/2016   INR 1.1 08/30/2019   HGBA1C 6.2 (H) 10/19/2019   Pelvic ultrasound 08/12/19: Uterus Measurements: 9.2 x 4.5 x 7.1 cm = volume: 150.9 mL. 2.3 x 1.4 x 2.0 cm exophytic fibroid extends from the right uterine fundus. 3.3 x 2.7 x 3.1 cm intramural fibroid present at the left posterior mid uterine body. IMPRESSION: 1. Thickened endometrial stripe measuring up to 9.7 mm. In the setting of post-menopausal bleeding, endometrial sampling is indicated to exclude carcinoma. If results are benign, sonohysterogram should be considered for focal lesion work-up. (Ref: Radiological Reasoning: Algorithmic Workup of Abnormal Vaginal Bleeding with Endovaginal Sonography  and Sonohysterography. AJR 2008; 665:L93-57). 2. Fibroid uterus as above. 3. Nonvisualized left ovary, with normal right ovary. No adnexal mass or free fluid.  D&C on 08/30/19: A. ENDOMETRIUM, CURETTAGE:  - Focal simple and complex hyperplasia without atypia.  - See comment.  COMMENT:  The endometrial fragments show a few foci with crowded, focally  irregular glands consistent with simple and focal complex hyperplasia.  No significant cytologic atypia is identified and no features of  malignancy are identified.   EMB on 08/14/19: A. ENDOMETRIUM, BIOPSY:  - Very scant atrophic squamous epithelium.  - Blood.  - No endometrium or malignancy identified.

## 2019-11-06 ENCOUNTER — Telehealth: Payer: Self-pay | Admitting: Cardiology

## 2019-11-06 ENCOUNTER — Other Ambulatory Visit: Payer: Self-pay | Admitting: Cardiology

## 2019-11-06 ENCOUNTER — Encounter: Payer: Self-pay | Admitting: Gynecologic Oncology

## 2019-11-06 ENCOUNTER — Inpatient Hospital Stay: Payer: BC Managed Care – PPO | Attending: Gynecologic Oncology | Admitting: Gynecologic Oncology

## 2019-11-06 ENCOUNTER — Other Ambulatory Visit: Payer: Self-pay

## 2019-11-06 ENCOUNTER — Other Ambulatory Visit: Payer: Self-pay | Admitting: Gynecologic Oncology

## 2019-11-06 VITALS — BP 124/80 | HR 85 | Temp 98.7°F | Resp 18 | Ht 59.0 in | Wt 257.3 lb

## 2019-11-06 DIAGNOSIS — I480 Paroxysmal atrial fibrillation: Secondary | ICD-10-CM | POA: Insufficient documentation

## 2019-11-06 DIAGNOSIS — R011 Cardiac murmur, unspecified: Secondary | ICD-10-CM | POA: Insufficient documentation

## 2019-11-06 DIAGNOSIS — N8501 Benign endometrial hyperplasia: Secondary | ICD-10-CM | POA: Insufficient documentation

## 2019-11-06 DIAGNOSIS — Z7901 Long term (current) use of anticoagulants: Secondary | ICD-10-CM | POA: Insufficient documentation

## 2019-11-06 DIAGNOSIS — F419 Anxiety disorder, unspecified: Secondary | ICD-10-CM | POA: Insufficient documentation

## 2019-11-06 DIAGNOSIS — N950 Postmenopausal bleeding: Secondary | ICD-10-CM | POA: Insufficient documentation

## 2019-11-06 DIAGNOSIS — E669 Obesity, unspecified: Secondary | ICD-10-CM | POA: Diagnosis not present

## 2019-11-06 DIAGNOSIS — Z6841 Body Mass Index (BMI) 40.0 and over, adult: Secondary | ICD-10-CM | POA: Diagnosis not present

## 2019-11-06 DIAGNOSIS — Z79899 Other long term (current) drug therapy: Secondary | ICD-10-CM | POA: Insufficient documentation

## 2019-11-06 DIAGNOSIS — E1136 Type 2 diabetes mellitus with diabetic cataract: Secondary | ICD-10-CM | POA: Diagnosis not present

## 2019-11-06 DIAGNOSIS — Z30430 Encounter for insertion of intrauterine contraceptive device: Secondary | ICD-10-CM | POA: Insufficient documentation

## 2019-11-06 DIAGNOSIS — M8580 Other specified disorders of bone density and structure, unspecified site: Secondary | ICD-10-CM | POA: Insufficient documentation

## 2019-11-06 DIAGNOSIS — I10 Essential (primary) hypertension: Secondary | ICD-10-CM | POA: Insufficient documentation

## 2019-11-06 DIAGNOSIS — Z800 Family history of malignant neoplasm of digestive organs: Secondary | ICD-10-CM

## 2019-11-06 MED ORDER — LEVONORGESTREL 20 MCG/24HR IU IUD: 1.0000 | INTRAUTERINE_SYSTEM | Freq: Once | INTRAUTERINE | 0 refills | Status: DC

## 2019-11-06 MED FILL — MIRENA SYSTEM: 20 | 90 days supply | Qty: 1 | Fill #0

## 2019-11-06 NOTE — Telephone Encounter (Signed)
Pt voiced understanding

## 2019-11-06 NOTE — Progress Notes (Signed)
Mirena IUD to be placed in the office on Aug 23.  $0 dollar copay for patient.

## 2019-11-06 NOTE — Patient Instructions (Signed)
It was a pleasure meeting you today. I will see you next week to put the Mirena IUD in. If you need anything before, please call the office at [phone].

## 2019-11-06 NOTE — Telephone Encounter (Signed)
Patient called stating that since she stopped taking the Eliquis the bleeding is not as bad. States had HGB done on 10/31/2019 12.8  & HGB 12.3 11/03/2019. States that she went back of Eliquis  11/03/2019. She said that Dr. Harl Bowie had requested that she call and give update.

## 2019-11-06 NOTE — Telephone Encounter (Signed)
Monitor bleeding back on eliquis, I see she saw her ob gyn today, we will see what they say as well   Zandra Abts MD

## 2019-11-07 ENCOUNTER — Telehealth: Payer: Self-pay | Admitting: *Deleted

## 2019-11-07 ENCOUNTER — Encounter: Payer: Self-pay | Admitting: Gynecologic Oncology

## 2019-11-07 ENCOUNTER — Telehealth: Payer: Self-pay

## 2019-11-07 DIAGNOSIS — N8501 Benign endometrial hyperplasia: Secondary | ICD-10-CM | POA: Insufficient documentation

## 2019-11-07 NOTE — Telephone Encounter (Signed)
Told Nicole Cabrera  That there is no co pay for the IUD. Our office will p/u the IUD at [facility] out patient pharmacy for 11-13-19 appointment. Pt verbalized understanding.

## 2019-11-07 NOTE — Telephone Encounter (Signed)
Called and spoke with the patient regarding scheduling a genetics appt. Patient wishes to wait and discuss with Dr Berline Lopes on Monday

## 2019-11-10 NOTE — Progress Notes (Signed)
Patient started on Crestor 10/30/2019 for elevated LDL of 100 mg percent.

## 2019-11-13 ENCOUNTER — Inpatient Hospital Stay (HOSPITAL_BASED_OUTPATIENT_CLINIC_OR_DEPARTMENT_OTHER): Payer: BC Managed Care – PPO | Admitting: Gynecologic Oncology

## 2019-11-13 ENCOUNTER — Ambulatory Visit (HOSPITAL_COMMUNITY)
Admission: RE | Admit: 2019-11-13 | Discharge: 2019-11-13 | Disposition: A | Discharge status: Home / Self Care | Payer: BC Managed Care – PPO | Source: Ambulatory Visit | Attending: Neurology | Admitting: Neurology

## 2019-11-13 ENCOUNTER — Telehealth: Payer: Self-pay | Admitting: *Deleted

## 2019-11-13 ENCOUNTER — Encounter: Payer: Self-pay | Admitting: Gynecologic Oncology

## 2019-11-13 ENCOUNTER — Other Ambulatory Visit: Payer: Self-pay

## 2019-11-13 VITALS — HR 65 | Temp 99.0°F | Resp 20 | Ht 59.0 in | Wt 257.0 lb

## 2019-11-13 DIAGNOSIS — I10 Essential (primary) hypertension: Secondary | ICD-10-CM | POA: Diagnosis not present

## 2019-11-13 DIAGNOSIS — F419 Anxiety disorder, unspecified: Secondary | ICD-10-CM | POA: Diagnosis not present

## 2019-11-13 DIAGNOSIS — R011 Cardiac murmur, unspecified: Secondary | ICD-10-CM | POA: Diagnosis not present

## 2019-11-13 DIAGNOSIS — I63111 Cerebral infarction due to embolism of right vertebral artery: Secondary | ICD-10-CM | POA: Insufficient documentation

## 2019-11-13 DIAGNOSIS — E669 Obesity, unspecified: Secondary | ICD-10-CM | POA: Diagnosis not present

## 2019-11-13 DIAGNOSIS — N8501 Benign endometrial hyperplasia: Secondary | ICD-10-CM

## 2019-11-13 DIAGNOSIS — Z7901 Long term (current) use of anticoagulants: Secondary | ICD-10-CM | POA: Diagnosis not present

## 2019-11-13 DIAGNOSIS — Z79899 Other long term (current) drug therapy: Secondary | ICD-10-CM | POA: Diagnosis not present

## 2019-11-13 DIAGNOSIS — E1136 Type 2 diabetes mellitus with diabetic cataract: Secondary | ICD-10-CM | POA: Diagnosis not present

## 2019-11-13 DIAGNOSIS — Z30430 Encounter for insertion of intrauterine contraceptive device: Secondary | ICD-10-CM

## 2019-11-13 DIAGNOSIS — N950 Postmenopausal bleeding: Secondary | ICD-10-CM | POA: Diagnosis not present

## 2019-11-13 DIAGNOSIS — M8580 Other specified disorders of bone density and structure, unspecified site: Secondary | ICD-10-CM | POA: Diagnosis not present

## 2019-11-13 DIAGNOSIS — I480 Paroxysmal atrial fibrillation: Secondary | ICD-10-CM | POA: Diagnosis not present

## 2019-11-13 DIAGNOSIS — Z6841 Body Mass Index (BMI) 40.0 and over, adult: Secondary | ICD-10-CM | POA: Diagnosis not present

## 2019-11-13 NOTE — Telephone Encounter (Signed)
-----   Message from Britt Bottom, MD sent at 11/13/2019  1:14 PM EDT ----- Please let the patient know that there is not any significant narrowing of the carotid arteries

## 2019-11-13 NOTE — Telephone Encounter (Signed)
Tried calling pt at [phone], phone continued to ring.

## 2019-11-13 NOTE — Progress Notes (Signed)
Gynecologic Oncology Return Clinic Visit  11/13/19   Reason for Visit: IUD insertion  Interval History: Doing well, denies any significant change since her last visit.   Past Medical/Surgical History: Past Medical History:  Diagnosis Date  . Allergy   . Anxiety    xanax  . Arthritis    shoulders/hips - tx with OTC meds  . Breast cyst 03/1990   pt denies 12/05/13  . Bronchitis   . Cataract   . Cervical tab 1972  . 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 Diabetes mellitus without complication ([place])    stated not diabetic but her AIC has been high so she is monitored closely   . Dyspnea   . Dysrhythmia   . Endometrial polyp 04/2000  . Fall 01/2018   nose fracture   . Heart murmur   . Hemorrhoids   . Hypertension   . Kidney stones   . Obesity   . Osteopenia 05/2007  . Paroxysmal atrial fibrillation ([facility])   . Peripheral neuropathy   . PMB (postmenopausal bleeding) 04/2000  . SAB (spontaneous abortion) 1981  . Sleep apnea    CPAP  . Stroke ([place])   . SVD (spontaneous vaginal delivery) 04/1964, 05/1978   x3  . TIA (transient ischemic attack)     Past Surgical History:  Procedure Laterality Date  . CATARACT EXTRACTION Right 12/07/2018  . CERVICAL DISC SURGERY  2/93  . CESAREAN SECTION  6/83   with BTL  . COLONOSCOPY N/A 03/19/2017   Procedure: COLONOSCOPY;  Surgeon: Doran Stabler, MD;  Location: [facility];  Service: Gastroenterology;  Laterality: N/A;  . DILATION AND CURETTAGE OF UTERUS    . HYSTEROSCOPY  5/02, 01/2011  . JOINT REPLACEMENT    . LITHOTRIPSY  08/30/2014   with Dr. Jeffie Pollock at [facility]  . TOTAL KNEE ARTHROPLASTY Right 6/08   right  . TOTAL KNEE ARTHROPLASTY Left 4/10   left   . TUBAL LIGATION    . WRIST SURGERY Left 4/07   left    Family History  Problem Relation Age of Onset  . Heart disease Father   . Colon cancer Mother   . Heart disease Mother   . Breast cancer Maternal Aunt   . Colon cancer Maternal Aunt   . Colon  cancer Maternal Uncle   . Ehlers-Danlos syndrome Other        neice-being tested 4/15  . Stomach cancer Neg Hx   . Pancreatic cancer Neg Hx   . Endometrial cancer Neg Hx   . Ovarian cancer Neg Hx     Social History   Socioeconomic History  . Marital status: Married    Spouse name: [name]  . Number of children: 3  . Years of education: college  . Highest education level: Not on file  Occupational History  . 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 Smoking status: Never Smoker  . Smokeless tobacco: Never Used  Vaping Use  . Vaping Use: Never used  Substance and Sexual Activity  . Alcohol use: No  . Drug use: No  . Sexual activity: Not Currently    Partners: Male    Birth control/protection: Surgical    Comment: BTL  Other Topics Concern  . Not on file  Social History Narrative   Patient is married [name])   Patient has three children.   Patient is a homemaker.   Education- College   Right handed.  Caffeine- one coke cola          Social Determinants of Health   Financial Resource Strain:   . Difficulty of Paying Living Expenses: Not on file  Food Insecurity:   . Worried About Charity fundraiser in the Last Year: Not on file  . Ran Out of Food in the Last Year: Not on file  Transportation Needs:   . Lack of Transportation (Medical): Not on file  . Lack of Transportation (Non-Medical): Not on file  Physical Activity:   . Days of Exercise per Week: Not on file  . Minutes of Exercise per Session: Not on file  Stress:   . Feeling of Stress : Not on file  Social Connections:   . Frequency of Communication with Friends and Family: Not on file  . Frequency of Social Gatherings with Friends and Family: Not on file  . Attends Religious Services: Not on file  . Active Member of Clubs or Organizations: Not on file  . Attends Archivist Meetings: Not on file  . Marital Status: Not on file    Current  Medications:  Current Outpatient Medications:  .  acetaminophen (TYLENOL) 500 MG tablet, Take 1,000 mg by mouth every 6 (six) hours as needed for moderate pain or headache. , Disp: , Rfl:  .  ALPRAZolam (XANAX) 0.25 MG tablet, Take 0.25 mg by mouth daily as needed for anxiety. , Disp: , Rfl:  .  apixaban (ELIQUIS) 5 MG TABS tablet, Take 1 tablet (5 mg total) by mouth 2 (two) times daily., Disp: 60 tablet, Rfl: 6 .  chlorthalidone (HYGROTON) 25 MG tablet, Take 25 mg by mouth daily., Disp: , Rfl:  .  cholecalciferol (VITAMIN D) 1000 UNITS tablet, Take 1,000 Units by mouth every morning. , Disp: , Rfl:  .  guaiFENesin (MUCINEX) 600 MG 12 hr tablet, Take 600 mg by mouth daily as needed for cough., Disp: , Rfl:  .  levonorgestrel (MIRENA) 20 MCG/24HR IUD, 1 Intra Uterine Device (1 each total) by Intrauterine route once for 1 dose. To be placed in the office, Disp: 1 each, Rfl: 0 .  metoprolol tartrate (LOPRESSOR) 25 MG tablet, TAKE ONE TABLET BY MOUTH TWICE DAILY, MAY TAKE  EXTRA TABLET AS NEEDED FOR PALPITATIONS. (Patient taking differently: Take 25 mg by mouth 2 (two) times daily. ), Disp: 270 tablet, Rfl: 3 .  Multiple Vitamins-Minerals (MULTIVITAMIN WITH MINERALS) tablet, Take 1 tablet by mouth daily. , Disp: , Rfl:  .  Polyethyl Glycol-Propyl Glycol (SYSTANE OP), Place 1 drop into both eyes daily as needed (dry eyes). , Disp: , Rfl:  .  potassium chloride SA (KLOR-CON) 20 MEQ tablet, TAKE ONE TABLET BY MOUTH DAILY. (Patient taking differently: Take 20 mEq by mouth daily. ), Disp: 30 tablet, Rfl: 6 .  PROAIR HFA 108 (90 Base) MCG/ACT inhaler, INHALE TWO PUFFS BY MOUTH EVERY 6 HOURS AS NEEDED FOR WHEEZING AND SHORTNESS OF BREATH (Patient taking differently: Inhale 2 puffs into the lungs every 6 (six) hours as needed. ), Disp: 8.5 g, Rfl: 3 .  silver sulfADIAZINE (SILVADENE) 1 % cream, Apply 1 application topically daily. , Disp: , Rfl:  .  losartan (COZAAR) 100 MG tablet, Take 1 tablet (100 mg total)  by mouth daily., Disp: 90 tablet, Rfl: 3 .  megestrol (MEGACE) 40 MG tablet, Take 40 mg by mouth daily. (Patient not taking: Reported on 11/13/2019), Disp: , Rfl:  .  rosuvastatin (CRESTOR) 5 MG tablet, Take 1 tablet (5  mg total) by mouth daily. (Patient not taking: Reported on 11/03/2019), Disp: 30 tablet, Rfl: 11  Review of Systems: + vaginal bleeding Denies appetite changes, fevers, chills, fatigue, unexplained weight changes. Denies hearing loss, neck lumps or masses, mouth sores, ringing in ears or voice changes. Denies cough or wheezing.  Denies shortness of breath. Denies chest pain or palpitations. Denies leg swelling. Denies abdominal distention, pain, blood in stools, constipation, diarrhea, nausea, vomiting, or early satiety. Denies pain with intercourse, dysuria, frequency, hematuria or incontinence. Denies hot flashes, pelvic pain or vaginal discharge.   Denies joint pain, back pain or muscle pain/cramps. Denies itching, rash, or wounds. Denies dizziness, headaches, numbness or seizures. Denies swollen lymph nodes or glands, denies easy bruising or bleeding. Denies anxiety, depression, confusion, or decreased concentration.  Physical Exam: Pulse 65   Temp 99 F (37.2 C) (Tympanic)   Resp 20   Ht _0  (1.499 m)   Wt 257 lb (116.6 kg)   LMP 01/22/2011   SpO2 98% Comment: RA  BMI 51.91 kg/m  General: Alert, oriented, no acute distress. HEENT: Normocephalic, atraumatic, sclera anicteric. Chest: Clear to auscultation bilaterally.   Cardiovascular: Regular rate and rhythm, no murmurs. Abdomen: Obese, soft, nontender.  Normoactive bowel sounds.  No masses or hepatosplenomegaly appreciated.  Supraumbilical hernia vs. Rectus diastasis. Extremities: Grossly normal range of motion.  Warm, well perfused.  1+ edema bilaterally. Skin: No rashes or lesions noted. Lymphatics: No cervical, supraclavicular, or inguinal adenopathy. GU: Normal appearing external genitalia without  erythema, excoriation, or lesions.  Speculum exam reveals mildly atrophic vaginal mucosa. Vaginal vault is quite long with significant laxity of sidewall tissue.  Bimanual exam reveals cervix somewhat anterior, flush with the vagina. Unable to appreciate uterus/adnexa due to body habitus.    Mirena IUD insertion  The procedure was explained.  A Graves speculum was initially placed however the cervix was not able to be visualized.  The large plastic speculum that we had in clinic was then used in the cervical os was visualized although posterior to the anterior blade of the speculum.  The upper vagina was swabbed with Betadine x3.  A single-tooth tenaculum was placed on the posterior wall of the vagina to help stabilize the vagina and bring the cervix inferiorly.  Cytobrush was then used to find the cervical canal and an endometrial Pipelle was placed until resistance met, sounding to a depth of 7 cm.  Mirena IUD lot number [id number], expiration date 12/2021 was then placed to the uterine fundus.  The IUD inserter was deployed, the inserter removed, and the strings cut at approximately 6 cm.  Tenaculum was removed from the vagina and tenaculum sites noted to be hemostatic.  Overall the patient tolerated the procedure well.    Laboratory & Radiologic Studies: None new  Assessment & Plan: Nicole Cabrera is a 74 y.o. 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  Mirena IUD placed today for treatment of complex endometrial hyperplasia without atypia.  Overall the patient tolerated the procedure well although it was quite difficult secondary to body habitus.  Given that I was unable to visualize the cervix itself, I discussed with the patient getting an ultrasound in the next several weeks to assure that the IUD is intrauterine.  I also discussed that given her difficult exam as well as difficulty with prior sampling, it would be very reasonable not to resample her uterus  until 6 months after starting treatment.  Depending on those biopsy  results, the patient may be able to have less frequent sampling if she responds to progesterone therapy.  We discussed expectations with bleeding.  The patient knows to call the clinic with any questions or concerns in the future.  Otherwise, she will follow up with Dr. Ammie Ferrier office.  26 minutes of total time was spent for this patient encounter, including preparation, face-to-face counseling with the patient and coordination of care, and documentation of the encounter.  Jeral Pinch, MD  Division of Gynecologic Oncology  Department of Obstetrics and Gynecology  [facility]

## 2019-11-13 NOTE — Patient Instructions (Signed)
I will call you after your pelvic ultrasound. Otherwise, please call if you have any issues related to the IUD. Dr. Sabra Heck has you scheduled for your next biopsy.

## 2019-11-14 NOTE — Telephone Encounter (Signed)
Called and spoke with pt about results. Pt verbalized understanding and appreciation for call.

## 2019-11-15 DIAGNOSIS — L600 Ingrowing nail: Secondary | ICD-10-CM | POA: Diagnosis not present

## 2019-11-15 DIAGNOSIS — M79675 Pain in left toe(s): Secondary | ICD-10-CM | POA: Diagnosis not present

## 2019-11-15 DIAGNOSIS — L03032 Cellulitis of left toe: Secondary | ICD-10-CM | POA: Diagnosis not present

## 2019-11-23 ENCOUNTER — Encounter: Payer: Self-pay | Admitting: Internal Medicine

## 2019-11-23 ENCOUNTER — Other Ambulatory Visit: Payer: Self-pay

## 2019-11-23 ENCOUNTER — Ambulatory Visit (INDEPENDENT_AMBULATORY_CARE_PROVIDER_SITE_OTHER): Payer: BC Managed Care – PPO | Admitting: Internal Medicine

## 2019-11-23 VITALS — BP 120/70 | HR 74 | Ht 59.5 in | Wt 254.6 lb

## 2019-11-23 DIAGNOSIS — J45909 Unspecified asthma, uncomplicated: Secondary | ICD-10-CM | POA: Diagnosis not present

## 2019-11-23 DIAGNOSIS — G4733 Obstructive sleep apnea (adult) (pediatric): Secondary | ICD-10-CM | POA: Diagnosis not present

## 2019-11-23 DIAGNOSIS — I639 Cerebral infarction, unspecified: Secondary | ICD-10-CM

## 2019-11-23 MED ORDER — ALBUTEROL SULFATE HFA 108 (90 BASE) MCG/ACT IN AERS: INHALATION_SPRAY | RESPIRATORY_TRACT | 12 refills | Status: AC

## 2019-11-23 NOTE — Progress Notes (Signed)
HPI female never smoker followed for OSA, complicated by HBP, history of bronchitis, PAFib, CVA NPSG 07/12/85-AHI 50.7/hour, desaturation to 84%  ---------------------------------------------------------------------------------------   11/24/2018- 74 yo female never smoker followed for OSA, complicated by HBP, history of bronchitis, PAFib, CVA 2018 CPAP auto 10-15/ [facility]-  Download compliance 100%, AHI 4.1/ hr -----OSA on CPAP, DME: [place], no complaints, pt brought SD card in for DL Body weight today 259 lbs Proair hfa Machine is 75 yr old and working well.  Declines flu vax.  Rarely needs ProAir Covid tested neg in July for viral URI. Remains careful. CXR 10/06/17 IMPRESSION: Enlargement of cardiac silhouette with pulmonary vascular congestion. Minimal RIGHT basilar atelectasis.  11/23/19- 74 yo female never smoker followed for OSA, complicated by HBP, history of bronchitis, PAFib, CVA 2018 CPAP auto 10-15/ [facility]-  -----Pt states she is still wearing her CPAP at night averaging about 7-8 hours a night. Pt denies any problems with the CPAP. Download not available Body weight today 254 lbs CPAP auto 10-15/ [facility] Had mild CVA 11/ 2000 Covax- 2 Moderna Likes to keep rescue inhaler available, but never uses.   ROS-see HPI + = positive Constitutional:   No-   weight loss, night sweats, fevers, chills, +fatigue, lassitude. HEENT:   No-  headaches, difficulty swallowing, tooth/dental problems, sore throat,       No-  sneezing, itching, ear ache, nasal congestion, post nasal drip,  CV:  No-   chest pain, orthopnea, PND, swelling in lower extremities, anasarca, dizziness, palpitations Resp: +shortness of breath with exertion or at rest.              No-   productive cough,  + non-productive cough,  No- coughing up of blood.              No-   change in color of mucus.  No- wheezing.   Skin: No-   rash or lesions. GI:  No-   heartburn, indigestion,  abdominal pain, nausea, vomiting,  GU: . MS:  No-   joint pain or swelling.  Neuro-     nothing unusual Psych:  No- change in mood or affect. No depression or anxiety.  No memory loss.  OBJ   General- Alert, Oriented, Affect-appropriate, Distress- none acute; + morbid obesity, + rolling walker Skin- rash-none, lesions- none, excoriation- none Lymphadenopathy- none Head- atraumatic            Eyes- Gross vision intact, PERRLA, conjunctivae clear secretions            Ears- Hearing, canals-normal            Nose- Clear, no-Septal dev, mucus, polyps, erosion, perforation             Throat- Mallampati III , mucosa clear/ not red , drainage- none, tonsils- atrophic Neck- flexible , trachea midline, no stridor , thyroid nl, carotid no bruit Chest - symmetrical excursion , unlabored           Heart/CV- RRR , no murmur , no gallop  , no rub, nl s1 s2                           - JVD- none , edema- none, stasis changes- none, varices- none           Lung- clear to P&A, wheeze- none, cough- none , dullness-none, rub- none           Chest wall-  Abd-  Br/ Gen/ Rectal- Not done, not indicated Extrem- cyanosis- none, clubbing, none, atrophy- none, strength- nl, + cane Neuro- grossly intact to observation

## 2019-11-23 NOTE — Patient Instructions (Signed)
We will ask [facility] for a download from your CPAP machine to see how it is functioning.  Ok to continue CPAP auto 10-15  We sent a refill for your Proair rescue inhaler  Please call if we can help

## 2019-11-28 ENCOUNTER — Other Ambulatory Visit (HOSPITAL_COMMUNITY): Payer: BC Managed Care – PPO

## 2019-12-05 ENCOUNTER — Other Ambulatory Visit: Payer: Self-pay

## 2019-12-05 ENCOUNTER — Ambulatory Visit (HOSPITAL_COMMUNITY)
Admission: RE | Admit: 2019-12-05 | Discharge: 2019-12-05 | Disposition: A | Discharge status: Home / Self Care | Payer: BC Managed Care – PPO | Source: Ambulatory Visit | Attending: Gynecologic Oncology | Admitting: Gynecologic Oncology

## 2019-12-05 ENCOUNTER — Other Ambulatory Visit: Payer: Self-pay | Admitting: Gynecologic Oncology

## 2019-12-05 DIAGNOSIS — Z780 Asymptomatic menopausal state: Secondary | ICD-10-CM | POA: Diagnosis not present

## 2019-12-05 DIAGNOSIS — N854 Malposition of uterus: Secondary | ICD-10-CM | POA: Diagnosis not present

## 2019-12-05 DIAGNOSIS — N83312 Acquired atrophy of left ovary: Secondary | ICD-10-CM | POA: Diagnosis not present

## 2019-12-05 DIAGNOSIS — N8501 Benign endometrial hyperplasia: Secondary | ICD-10-CM | POA: Insufficient documentation

## 2019-12-05 DIAGNOSIS — N8500 Endometrial hyperplasia, unspecified: Secondary | ICD-10-CM | POA: Diagnosis not present

## 2019-12-06 ENCOUNTER — Other Ambulatory Visit: Payer: Self-pay | Admitting: Cardiology

## 2019-12-06 ENCOUNTER — Telehealth: Payer: Self-pay

## 2019-12-06 NOTE — Telephone Encounter (Signed)
Will you call Ms Adeyemi and let her know IUD is in the correct location per Dr. Berline Lopes.

## 2019-12-06 NOTE — Telephone Encounter (Signed)
Spoke with Nicole Cabrera and told her that IUD placement as noted below by Dr. Berline Lopes. Pt verbalized understanding.

## 2019-12-10 NOTE — Assessment & Plan Note (Signed)
Mild intermittent uncomplicated Plan- refill Proair inhaler

## 2019-12-10 NOTE — Assessment & Plan Note (Signed)
She reports there is little residual

## 2019-12-10 NOTE — Assessment & Plan Note (Signed)
Benefits from CPAP Plan- download for settings and compliance

## 2019-12-18 ENCOUNTER — Telehealth: Payer: Self-pay | Admitting: Obstetrics & Gynecology

## 2019-12-18 NOTE — Telephone Encounter (Signed)
Reviewed with Dr. Sabra Heck, call returned to patient.  EMB r/s to 01/11/20 at 3:30pm with Dr. Sabra Heck.  Patient is agreeable to date and time.  Encounter closed.

## 2019-12-18 NOTE — Telephone Encounter (Signed)
Patient canceled her repeat EMB due needs to go out of town for daughter surgery. She will be leaving tomorrow and return 01/04/20. To triage to assist with scheduling.

## 2019-12-19 ENCOUNTER — Telehealth: Payer: Self-pay | Admitting: Obstetrics & Gynecology

## 2019-12-19 NOTE — Telephone Encounter (Signed)
Call placed to convey benefits for endometrial biopsy. 

## 2019-12-28 ENCOUNTER — Ambulatory Visit: Payer: Self-pay | Admitting: Obstetrics & Gynecology

## 2020-01-09 ENCOUNTER — Telehealth: Payer: Self-pay

## 2020-01-09 NOTE — Telephone Encounter (Signed)
Spoke with Nicole Cabrera. Nicole Cabrera states had to cancel procedure on Thursday due to conflict with another appt for husband. Nicole Cabrera given recommendations per Dr Sabra Heck. Nicole Cabrera agreeable. Nicole Cabrera scheduled for 11/18 at 1 pm with Dr Sabra Heck. Nicole Cabrera verbalized understanding to date and time of appt. Routing to Dr Sabra Heck for [facility] Encounter closed

## 2020-01-09 NOTE — Telephone Encounter (Signed)
Pt needs 3 month endometrial biopsy after IUD placement in August.  It is fine to wait a few more weeks to have this done but pt does need to be scheduled prior to end of November if at all possible.  Thank you.

## 2020-01-09 NOTE — Telephone Encounter (Signed)
Patient cancelled procedure due to not having a way to get to the office.

## 2020-01-11 ENCOUNTER — Ambulatory Visit: Payer: Self-pay | Admitting: Obstetrics & Gynecology

## 2020-02-01 ENCOUNTER — Other Ambulatory Visit: Payer: Self-pay

## 2020-02-01 ENCOUNTER — Telehealth: Payer: Self-pay | Admitting: Obstetrics and Gynecology

## 2020-02-01 DIAGNOSIS — N8501 Benign endometrial hyperplasia: Secondary | ICD-10-CM

## 2020-02-01 DIAGNOSIS — N950 Postmenopausal bleeding: Secondary | ICD-10-CM

## 2020-02-01 NOTE — Telephone Encounter (Signed)
Please check with the patient to see if she has been stopping her Eliquis prior to doing her office endometrial biopsies.

## 2020-02-01 NOTE — Telephone Encounter (Signed)
Left message to call Marcille Barman, RN at [facility] [phone].   

## 2020-02-01 NOTE — Progress Notes (Signed)
EMB orders placed with Dr [name]. Encounter closed

## 2020-02-06 NOTE — Telephone Encounter (Signed)
Per review of Epic, patient is scheduled for EMB on 02/27/20.   Call returned to patient. Patient states she has not stopped her Eliquis in the past prior to office EMB. Patient reports bleeding is scant, spotting. Denies any other symptoms. Advised patient I will update Dr. [name] and f/u with any additional recommendations. Patient agreeable.   Routing to Dr. [name] for final review.

## 2020-02-07 NOTE — Telephone Encounter (Signed)
Ok to continue on Eliquis prior to her endometrial biopsy.

## 2020-02-08 ENCOUNTER — Ambulatory Visit: Payer: Self-pay | Admitting: Obstetrics & Gynecology

## 2020-02-08 NOTE — Telephone Encounter (Signed)
Spoke with pt. Pt given update and recommendations per Dr [name]. Pt agreeable to take Eliquis prior to EMB.  Pt verbalized understanding to date and time of appt on 02/27/20.No further questions at this time.  Encounter closed

## 2020-02-22 DIAGNOSIS — R7302 Impaired glucose tolerance (oral): Secondary | ICD-10-CM | POA: Diagnosis not present

## 2020-02-22 DIAGNOSIS — N950 Postmenopausal bleeding: Secondary | ICD-10-CM | POA: Diagnosis not present

## 2020-02-22 DIAGNOSIS — Z0000 Encounter for general adult medical examination without abnormal findings: Secondary | ICD-10-CM | POA: Diagnosis not present

## 2020-02-22 DIAGNOSIS — N958 Other specified menopausal and perimenopausal disorders: Secondary | ICD-10-CM | POA: Diagnosis not present

## 2020-02-27 ENCOUNTER — Encounter: Payer: Self-pay | Admitting: Obstetrics and Gynecology

## 2020-02-27 ENCOUNTER — Ambulatory Visit (INDEPENDENT_AMBULATORY_CARE_PROVIDER_SITE_OTHER): Payer: BC Managed Care – PPO | Admitting: Obstetrics and Gynecology

## 2020-02-27 ENCOUNTER — Other Ambulatory Visit: Payer: Self-pay

## 2020-02-27 VITALS — BP 174/90 | HR 92 | Ht 59.0 in | Wt 252.0 lb

## 2020-02-27 DIAGNOSIS — N950 Postmenopausal bleeding: Secondary | ICD-10-CM | POA: Diagnosis not present

## 2020-02-27 DIAGNOSIS — N8501 Benign endometrial hyperplasia: Secondary | ICD-10-CM | POA: Diagnosis not present

## 2020-02-27 DIAGNOSIS — I639 Cerebral infarction, unspecified: Secondary | ICD-10-CM

## 2020-02-27 NOTE — Progress Notes (Signed)
GYNECOLOGY  VISIT   HPI: 74 y.o.   Married  Caucasian  female   [phone] with Patient's last menstrual period was 01/22/2011.   here for potential endometrial biopsy for follow up of complex endometrial hyperplasia without atypia.  Patient has a history of postmenopausal bleeding and difficulty with endometrial sampling due to her anatomy.  She had endometrial sampling by [place] in the OR on 08/30/19 with Dr. Sabra Heck, and this documented simple and complex endometrial hyperplasia without atypia.     Patient was seen in follow up by Dr. Berline Lopes on 11/13/19 and she had endometrial sampling and placement of a Mirena IUD the same day.   Her EMB showed has simple and complex endometrial hyperplasia without atypia. The IUD placement was difficult, and she had a pelvic US to confirm proper placement of the IUD on 12/05/19.  Dr. Berline Lopes recommended follow up endometrial biopsy at least 6 months after placement of the Mirena.   She is on Eliquis for hx of atrial fibrillation and stroke.  Patient reports bleeding and spotting for the last 7 months.  Still spotting the size of a quarter. In August, she did have heavy bleeding and her cardiologist stopped her Eliquis temporarily.   She is having yellowish vaginal discharge.  No vaginal itching.  Some vaginal odor.   She did some cramping last week.   She declines endometrial biopsy today and has frustration about the difficulty of her exams.   GYNECOLOGIC HISTORY: Patient's last menstrual period was 01/22/2011. Contraception:  Tub[place]   Menopausal hormone therapy:  none Last mammogram: 10/02/16 BIRADS1:neg Last pap smear:  05/16/18 Neg, 11-18-15 Neg, 06-29-13 Neg        OB History    Gravida  5   Para  3   Term      Preterm      AB  2   Living  3     SAB  1   TAB  1   Ectopic      Multiple      Live Births                 Patient Active Problem List   Diagnosis Date Noted  . Complex endometrial hyperplasia without atypia  11/07/2019  . Acute ischemic stroke ([place]) 02/08/2019  . Acute CVA (cerebrovascular accident) ([place]) 02/07/2019  . Coagulopathy ([place]) 02/07/2019  . Anticoagulated 02/07/2019  . Visual changes 02/07/2019  . Leukocytosis 02/07/2019  . Hyperglycemia 02/07/2019  . Diabetes mellitus without complication ([place])   . Hypokalemia 03/19/2017  . HLD (hyperlipidemia) 03/19/2017  . Hematochezia   . Acute blood loss anemia   . Rectal bleeding 03/18/2017  . Acute GI bleeding   . 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 Stroke (cerebrum) ([place]) 01/18/2017  . Intracerebral hemorrhage 01/18/2017  . Elevated troponin 01/17/2017  . A-fib ([place]) 10/27/2016  . Morbid (severe) obesity due to excess calories ([place]) 06/20/2015  . Nephrolithiasis 07/09/2014  . Asthma with bronchitis 02/23/2014  . Aphasia 02/27/2013  . PMB (postmenopausal bleeding) 01/23/2011  . ACUTE BRONCHITIS 08/30/2009  . Essential hypertension 02/22/2007  . Obstructive sleep apnea (adult) (pediatric) 02/22/2007  . ALLERGY 02/22/2007    Past Medical History:  Diagnosis Date  . Allergy   . Anxiety    xanax  . Arthritis    shoulders/hips - tx with OTC meds  . Breast cyst 03/1990   pt denies 12/05/13  . Bronchitis   . Cataract   . Cervical tab 1972  .  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 Diabetes mellitus without complication ([place])    stated not diabetic but her AIC has been high so she is monitored closely   . Dyspnea   . Dysrhythmia   . Endometrial polyp 04/2000  . Fall 01/2018   nose fracture   . Heart murmur   . Hemorrhoids   . Hypertension   . Kidney stones   . Obesity   . Osteopenia 05/2007  . Paroxysmal atrial fibrillation ([facility])   . Peripheral neuropathy   . PMB (postmenopausal bleeding) 04/2000  . SAB (spontaneous abortion) 1981  . Sleep apnea    CPAP  . Stroke ([place])   . SVD (spontaneous vaginal delivery) 04/1964, 05/1978   x3  . TIA (transient ischemic attack)     Past Surgical History:   Procedure Laterality Date  . CATARACT EXTRACTION Right 12/07/2018  . CERVICAL DISC SURGERY  2/93  . CESAREAN SECTION  6/83   with BTL  . COLONOSCOPY N/A 03/19/2017   Procedure: COLONOSCOPY;  Surgeon: Doran Stabler, MD;  Location: [facility];  Service: Gastroenterology;  Laterality: N/A;  . DILATION AND CURETTAGE OF UTERUS    . HYSTEROSCOPY  5/02, 01/2011  . JOINT REPLACEMENT    . LITHOTRIPSY  08/30/2014   with Dr. Jeffie Pollock at [facility]  . TOTAL KNEE ARTHROPLASTY Right 6/08   right  . TOTAL KNEE ARTHROPLASTY Left 4/10   left   . TUBAL LIGATION    . WRIST SURGERY Left 4/07   left    Current Outpatient Medications  Medication Sig Dispense Refill  . acetaminophen (TYLENOL) 500 MG tablet Take 1,000 mg by mouth every 6 (six) hours as needed for moderate pain or headache.     . albuterol (PROAIR HFA) 108 (90 Base) MCG/ACT inhaler INHALE TWO PUFFS BY MOUTH EVERY 6 HOURS AS NEEDED FOR WHEEZING AND SHORTNESS OF BREATH 18 g 12  . ALPRAZolam (XANAX) 0.25 MG tablet Take 0.25 mg by mouth daily as needed for anxiety.     Marland Kitchen apixaban (ELIQUIS) 5 MG TABS tablet Take 1 tablet (5 mg total) by mouth 2 (two) times daily. 60 tablet 6  . chlorthalidone (HYGROTON) 25 MG tablet TAKE ONE TABLET BY MOUTH DAILY. 90 tablet 3  . cholecalciferol (VITAMIN D) 1000 UNITS tablet Take 1,000 Units by mouth every morning.     Marland Kitchen guaiFENesin (MUCINEX) 600 MG 12 hr tablet Take 600 mg by mouth daily as needed for cough.    . losartan (COZAAR) 100 MG tablet Take 100 mg by mouth daily.    . metoprolol tartrate (LOPRESSOR) 25 MG tablet TAKE ONE TABLET BY MOUTH TWICE DAILY, MAY TAKE  EXTRA TABLET AS NEEDED FOR PALPITATIONS. (Patient taking differently: Take 25 mg by mouth 2 (two) times daily. ) 270 tablet 3  . Multiple Vitamins-Minerals (MULTIVITAMIN WITH MINERALS) tablet Take 1 tablet by mouth daily.     Vladimir Faster Glycol-Propyl Glycol (SYSTANE OP) Place 1 drop into both eyes daily as needed (dry eyes).     . potassium  chloride SA (KLOR-CON) 20 MEQ tablet TAKE ONE TABLET BY MOUTH DAILY. 90 tablet 3  . silver sulfADIAZINE (SILVADENE) 1 % cream Apply 1 application topically daily.     Marland Kitchen levonorgestrel (MIRENA) 20 MCG/24HR IUD 1 Intra Uterine Device (1 each total) by Intrauterine route once for 1 dose. To be placed in the office 1 each 0   No current facility-administered medications for this visit.     ALLERGIES: Bactrim [sulfamethoxazole-trimethoprim],  Ciprofloxacin, Hydroxyzine hcl, Keflet [cephalexin], Lipitor [atorvastatin], Lisinopril, Spironolactone, Vistaril [hydroxyzine hcl], Erythromycin, Latex, Penicillins, Tape, and Tetracycline  Family History  Problem Relation Age of Onset  . Heart disease Father   . Colon cancer Mother   . Heart disease Mother   . Breast cancer Maternal Aunt   . Colon cancer Maternal Aunt   . Colon cancer Maternal Uncle   . Ehlers-Danlos syndrome Other        neice-being tested 4/15  . Stomach cancer Neg Hx   . Pancreatic cancer Neg Hx   . Endometrial cancer Neg Hx   . Ovarian cancer Neg Hx     Social History   Socioeconomic History  . Marital status: Married    Spouse name: [name]  . Number of children: 3  . Years of education: college  . Highest education level: Not on file  Occupational History  . 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 Smoking status: Never Smoker  . Smokeless tobacco: Never Used  Vaping Use  . Vaping Use: Never used  Substance and Sexual Activity  . Alcohol use: No  . Drug use: No  . Sexual activity: Not Currently    Partners: Male    Birth control/protection: Surgical    Comment: BTL  Other Topics Concern  . Not on file  Social History Narrative   Patient is married [name])   Patient has three children.   Patient is a homemaker.   Education- College   Right handed.   Caffeine- one coke cola          Social Determinants of Health   Financial Resource Strain:   .  Difficulty of Paying Living Expenses: Not on file  Food Insecurity:   . Worried About Charity fundraiser in the Last Year: Not on file  . Ran Out of Food in the Last Year: Not on file  Transportation Needs:   . Lack of Transportation (Medical): Not on file  . Lack of Transportation (Non-Medical): Not on file  Physical Activity:   . Days of Exercise per Week: Not on file  . Minutes of Exercise per Session: Not on file  Stress:   . Feeling of Stress : Not on file  Social Connections:   . Frequency of Communication with Friends and Family: Not on file  . Frequency of Social Gatherings with Friends and Family: Not on file  . Attends Religious Services: Not on file  . Active Member of Clubs or Organizations: Not on file  . Attends Archivist Meetings: Not on file  . Marital Status: Not on file  Intimate Partner Violence:   . Fear of Current or Ex-Partner: Not on file  . Emotionally Abused: Not on file  . Physically Abused: Not on file  . Sexually Abused: Not on file    Review of Systems  All other systems reviewed and are negative.   PHYSICAL EXAMINATION:    BP (!) 174/90 (Cuff Size: Large)   Pulse 92   Ht 4\' 11"  (1.499 m)   Wt 252 lb (114.3 kg)   LMP 01/22/2011   SpO2 96%   BMI 50.90 kg/m     General appearance: alert, cooperative and appears stated age  ASSESSMENT  Postmenopausal bleeding.  Simple and complex endometrial hyperplasia treated with Mirena IUD.  Obesity.  Difficulty with endometrial sampling and procedures.  On Eliquis.   PLAN  I reviewed the patient's care  encounters in Epic related to her postmenopausal bleeding. Effectiveness of Mirena IUD for treatment of endometrial hyperplasia reviewed. Questions invited and answered. Will check CBC today.  Return in February, 2022 for EMB. Patient is in agree with this plan.   41 min  total time was spent for this patient encounter, including preparation, face-to-face counseling with the patient,  coordination of care, and documentation of the encounter.

## 2020-02-28 DIAGNOSIS — N950 Postmenopausal bleeding: Secondary | ICD-10-CM | POA: Diagnosis not present

## 2020-02-28 LAB — CBC
Hematocrit: 45.4 % (ref 34.0–46.6)
Hemoglobin: 14.3 g/dL (ref 11.1–15.9)
MCH: 26.8 pg (ref 26.6–33.0)
MCHC: 31.5 g/dL (ref 31.5–35.7)
MCV: 85 fL (ref 79–97)
Platelets: 288 10*3/uL (ref 150–450)
RBC: 5.34 x10E6/uL — ABNORMAL HIGH (ref 3.77–5.28)
RDW: 16.7 % — ABNORMAL HIGH (ref 11.7–15.4)
WBC: 10.4 10*3/uL (ref 3.4–10.8)

## 2020-03-13 ENCOUNTER — Ambulatory Visit (INDEPENDENT_AMBULATORY_CARE_PROVIDER_SITE_OTHER): Payer: BC Managed Care – PPO | Admitting: Cardiology

## 2020-03-13 ENCOUNTER — Encounter: Payer: Self-pay | Admitting: Cardiology

## 2020-03-13 VITALS — BP 160/100 | HR 80 | Ht 59.5 in | Wt 250.0 lb

## 2020-03-13 DIAGNOSIS — I480 Paroxysmal atrial fibrillation: Secondary | ICD-10-CM

## 2020-03-13 DIAGNOSIS — I10 Essential (primary) hypertension: Secondary | ICD-10-CM | POA: Diagnosis not present

## 2020-03-13 NOTE — Progress Notes (Signed)
Clinical Summary Nicole Cabrera is a 74 y.o.female seen today for follow up of the following medical problems.  1. Afib - new diagnosis during ER visit 09/14/16 - vaginal bleeding has been an ongoing issue, followed by ob/gyn - has been on eliquis - still spotting at times. Hgb was 14.3 02/28/20.   2. Hyperlipidemia - neurologist lowered statin, now off - muscle cramps/body [place] atorvastatin - she has not been willing to try alternative statin  - 01/2018 TC 186 TG 112 HDL 45 LDL 119  09/2019 TC 165 TG 153 LDL 100  - she has not started crestor, has not been in favor.   3. OSA - uses CPAP machine, followed by Dr Annamaria Boots.    4. HTN - with pcp bp 120/60.  - compliant with meds  4. 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 She had refused anticoag prior to that. - now on eliquis.  5. CAD - 12/2016 coronary CTA: coronary calcium score of 7, mild diffuse nonobstructive disease - elevated trop 12/2016 during admit with cardioembolic stroke, likely embolized to coronaries as well   6. Pulmonary nodules - followed by pulm  7. Vaginal bleeding - followed OB/gyn - recurrent even after recent D&C   Past Medical History:  Diagnosis Date  . Allergy   . Anxiety    xanax  . Arthritis    shoulders/hips - tx with OTC meds  . Breast cyst 03/1990   pt denies 12/05/13  . Bronchitis   . Cataract   . Cervical tab 1972  . 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 Diabetes mellitus without complication ([place])    stated not diabetic but her AIC has been high so she is monitored closely   . Dyspnea   . Dysrhythmia   . Endometrial polyp 04/2000  . Fall 01/2018   nose fracture   . Heart murmur   . Hemorrhoids   . Hypertension   . Kidney stones   . Obesity   . Osteopenia 05/2007  . Paroxysmal atrial fibrillation (HCC)   . Peripheral neuropathy   . PMB (postmenopausal bleeding) 04/2000  . SAB (spontaneous abortion) 1981  . Sleep apnea     CPAP  . Stroke ([place])   . SVD (spontaneous vaginal delivery) 04/1964, 05/1978   x3  . TIA (transient ischemic attack)      Allergies  Allergen Reactions  . Bactrim [Sulfamethoxazole-Trimethoprim] Other (See Comments)    Unknown   . Ciprofloxacin Other (See Comments)    GI upset  . Hydroxyzine Hcl     Other reaction(s): Other (See Comments) Pt becomes physchotic.  Marland Kitchen Keflet [Cephalexin] Other (See Comments)    GI upset  . Lipitor [Atorvastatin]     Muscle cramps  . Lisinopril Cough  . Spironolactone Nausea Only  . Vistaril [Hydroxyzine Hcl] Other (See Comments)    Pt becomes physchotic.  Marland Kitchen Erythromycin Rash  . Latex Rash  . Penicillins Rash    Has patient had a PCN reaction causing immediate rash, facial/tongue/throat swelling, SOB or lightheadedness with hypotension: Yes Has patient had a PCN reaction causing severe rash involving mucus membranes or skin necrosis: No Has patient had a PCN reaction that required hospitalization No Has patient had a PCN reaction occurring within the last 10 years: No If all of the above answers are "NO", then may proceed with Cephalosporin use.   . Tape Itching, Rash and Other (See Comments)    Burning  . Tetracycline Rash  Current Outpatient Medications  Medication Sig Dispense Refill  . acetaminophen (TYLENOL) 500 MG tablet Take 1,000 mg by mouth every 6 (six) hours as needed for moderate pain or headache.     . albuterol (PROAIR HFA) 108 (90 Base) MCG/ACT inhaler INHALE TWO PUFFS BY MOUTH EVERY 6 HOURS AS NEEDED FOR WHEEZING AND SHORTNESS OF BREATH 18 g 12  . ALPRAZolam (XANAX) 0.25 MG tablet Take 0.25 mg by mouth daily as needed for anxiety.     Marland Kitchen apixaban (ELIQUIS) 5 MG TABS tablet Take 1 tablet (5 mg total) by mouth 2 (two) times daily. 60 tablet 6  . chlorthalidone (HYGROTON) 25 MG tablet TAKE ONE TABLET BY MOUTH DAILY. 90 tablet 3  . cholecalciferol (VITAMIN D) 1000 UNITS tablet Take 1,000 Units by mouth every morning.     Marland Kitchen  guaiFENesin (MUCINEX) 600 MG 12 hr tablet Take 600 mg by mouth daily as needed for cough.    Marland Kitchen levonorgestrel (MIRENA) 20 MCG/24HR IUD 1 Intra Uterine Device (1 each total) by Intrauterine route once for 1 dose. To be placed in the office 1 each 0  . losartan (COZAAR) 100 MG tablet Take 100 mg by mouth daily.    . metoprolol tartrate (LOPRESSOR) 25 MG tablet TAKE ONE TABLET BY MOUTH TWICE DAILY, MAY TAKE  EXTRA TABLET AS NEEDED FOR PALPITATIONS. (Patient taking differently: Take 25 mg by mouth 2 (two) times daily. ) 270 tablet 3  . Multiple Vitamins-Minerals (MULTIVITAMIN WITH MINERALS) tablet Take 1 tablet by mouth daily.     Vladimir Faster Glycol-Propyl Glycol (SYSTANE OP) Place 1 drop into both eyes daily as needed (dry eyes).     . potassium chloride SA (KLOR-CON) 20 MEQ tablet TAKE ONE TABLET BY MOUTH DAILY. 90 tablet 3  . silver sulfADIAZINE (SILVADENE) 1 % cream Apply 1 application topically daily.      No current facility-administered medications for this visit.     Past Surgical History:  Procedure Laterality Date  . CATARACT EXTRACTION Right 12/07/2018  . CERVICAL DISC SURGERY  2/93  . CESAREAN SECTION  6/83   with BTL  . COLONOSCOPY N/A 03/19/2017   Procedure: COLONOSCOPY;  Surgeon: Doran Stabler, MD;  Location: [facility];  Service: Gastroenterology;  Laterality: N/A;  . DILATION AND CURETTAGE OF UTERUS    . HYSTEROSCOPY  5/02, 01/2011  . JOINT REPLACEMENT    . LITHOTRIPSY  08/30/2014   with Dr. Jeffie Pollock at [facility]  . TOTAL KNEE ARTHROPLASTY Right 6/08   right  . TOTAL KNEE ARTHROPLASTY Left 4/10   left   . TUBAL LIGATION    . WRIST SURGERY Left 4/07   left     Allergies  Allergen Reactions  . Bactrim [Sulfamethoxazole-Trimethoprim] Other (See Comments)    Unknown   . Ciprofloxacin Other (See Comments)    GI upset  . Hydroxyzine Hcl     Other reaction(s): Other (See Comments) Pt becomes physchotic.  Marland Kitchen Keflet [Cephalexin] Other (See Comments)    GI upset   . Lipitor [Atorvastatin]     Muscle cramps  . Lisinopril Cough  . Spironolactone Nausea Only  . Vistaril [Hydroxyzine Hcl] Other (See Comments)    Pt becomes physchotic.  Marland Kitchen Erythromycin Rash  . Latex Rash  . Penicillins Rash    Has patient had a PCN reaction causing immediate rash, facial/tongue/throat swelling, SOB or lightheadedness with hypotension: Yes Has patient had a PCN reaction causing severe rash involving mucus membranes or skin necrosis: No Has  patient had a PCN reaction that required hospitalization No Has patient had a PCN reaction occurring within the last 10 years: No If all of the above answers are "NO", then may proceed with Cephalosporin use.   . Tape Itching, Rash and Other (See Comments)    Burning  . Tetracycline Rash      Family History  Problem Relation Age of Onset  . Heart disease Father   . Colon cancer Mother   . Heart disease Mother   . Breast cancer Maternal Aunt   . Colon cancer Maternal Aunt   . Colon cancer Maternal Uncle   . Ehlers-Danlos syndrome Other        neice-being tested 4/15  . Stomach cancer Neg Hx   . Pancreatic cancer Neg Hx   . Endometrial cancer Neg Hx   . Ovarian cancer Neg Hx      Social History Ms. Resler reports that she has never smoked. She has never used smokeless tobacco. Ms. Bernett reports no history of alcohol use.   Review of Systems CONSTITUTIONAL: No weight loss, fever, chills, weakness or fatigue.  HEENT: Eyes: No visual loss, blurred vision, double vision or yellow sclerae.No hearing loss, sneezing, congestion, runny nose or sore throat.  SKIN: No rash or itching.  CARDIOVASCULAR: per hpi RESPIRATORY: No shortness of breath, cough or sputum.  GASTROINTESTINAL: No anorexia, nausea, vomiting or diarrhea. No abdominal pain or blood.  GENITOURINARY: No burning on urination, no polyuria NEUROLOGICAL: No headache, dizziness, syncope, paralysis, ataxia, numbness or tingling in the extremities. No change in  bowel or bladder control.  MUSCULOSKELETAL: No muscle, back pain, joint pain or stiffness.  LYMPHATICS: No enlarged nodes. No history of splenectomy.  PSYCHIATRIC: No history of depression or anxiety.  ENDOCRINOLOGIC: No reports of sweating, cold or heat intolerance. No polyuria or polydipsia.  Marland Kitchen   Physical Examination Today's Vitals   03/13/20 1307  BP: (!) 160/100  Pulse: 80  SpO2: 98%  Weight: 250 lb (113.4 kg)  Height: 4' 11.5" (1.511 m)   Body mass index is 49.65 kg/m.  Gen: resting comfortably, no acute distress HEENT: no scleral icterus, pupils equal round and reactive, no palptable cervical adenopathy,  CV: irreg, no mr/g, no jvd Resp: Clear to auscultation bilaterally GI: abdomen is soft, non-tender, non-distended, normal bowel sounds, no hepatosplenomegaly MSK: extremities are warm, no edema.  Skin: warm, no rash Neuro:  no focal deficits Psych: appropriate affect   Diagnostic Studies 09/2016 echo Study Conclusions  - Left ventricle: The cavity size was normal. Wall thickness was increased in a pattern of mild LVH. Systolic function was normal. The estimated ejection fraction was in the range of 60% to 65%. Wall motion was normal; there were no regional wall motion abnormalities. Features are consistent with a pseudonormal left ventricular filling pattern, with concomitant abnormal relaxation and increased filling pressure (grade 2 diastolic dysfunction). - Aortic valve: Mildly calcified annulus. Trileaflet. There was trivial regurgitation. - Mitral valve: Moderately calcified annulus. The findings are consistent with mild stenosis. There was mild regurgitation. Mean gradient (D): 3 mm Hg. Valve area by pressure half-time: 1.54 cm^2. - Left atrium: The atrium was moderately dilated. - Right atrium: Central venous pressure (est): 8 mm Hg. - Tricuspid valve: There was trivial regurgitation. - Pulmonary arteries: Systolic pressure could  not be accurately estimated. - Pericardium, extracardiac: There was no pericardial effusion.  Impressions:  - Mild LVH with LVEF 60-65% and grade 2 diastolic dysfunction. Moderate left atrial enlargement. Moderately calcified mitral  annulus with evidence of mild mitral regurgitation and mild mitral stenosis. Sclerotic aortic valve with trivial aortic regurgitation. Trivial tricuspid regurgitation.    Assessment and Plan  1. PAF -no recent symptoms - ongoing issues with vaginal bleeding and spotting, followed by ob/gyn currently with IUD, considering biopsy - normal Hgb earlier this month, in absence of significant anemia given likely prior cardioembolic CVA would continue eliquis.  - discussed briefly watchman device down the road if vaginal bleeding cannot be resolved, though has to be on a course of anticoag postprocedure.   2.HTN -elevated today but at goal at recent pcp visit, continue to monitor.   3. History of CVA - muscle aches on atorvastatinResistant to trying other statins         Arnoldo Lenis, M.D.

## 2020-03-13 NOTE — Patient Instructions (Signed)

## 2020-04-06 ENCOUNTER — Other Ambulatory Visit: Payer: Self-pay | Admitting: Student

## 2020-05-01 ENCOUNTER — Other Ambulatory Visit: Payer: Self-pay | Admitting: Obstetrics and Gynecology

## 2020-05-01 DIAGNOSIS — Z1231 Encounter for screening mammogram for malignant neoplasm of breast: Secondary | ICD-10-CM

## 2020-05-08 ENCOUNTER — Encounter: Payer: Self-pay | Admitting: Gastroenterology

## 2020-05-14 ENCOUNTER — Ambulatory Visit: Payer: BC Managed Care – PPO | Admitting: Obstetrics and Gynecology

## 2020-05-24 ENCOUNTER — Ambulatory Visit: Payer: BC Managed Care – PPO | Admitting: Obstetrics and Gynecology

## 2020-06-07 ENCOUNTER — Other Ambulatory Visit: Payer: Self-pay | Admitting: Cardiology

## 2020-06-13 DIAGNOSIS — Z13820 Encounter for screening for osteoporosis: Secondary | ICD-10-CM | POA: Diagnosis not present

## 2020-06-20 ENCOUNTER — Ambulatory Visit
Admission: RE | Admit: 2020-06-20 | Discharge: 2020-06-20 | Disposition: A | Discharge status: Home / Self Care | Payer: BC Managed Care – PPO | Source: Ambulatory Visit | Attending: Obstetrics and Gynecology | Admitting: Obstetrics and Gynecology

## 2020-06-20 ENCOUNTER — Other Ambulatory Visit: Payer: Self-pay

## 2020-06-20 DIAGNOSIS — Z1231 Encounter for screening mammogram for malignant neoplasm of breast: Secondary | ICD-10-CM | POA: Diagnosis not present

## 2020-06-21 HISTORY — PX: BREAST BIOPSY: SHX20

## 2020-06-24 ENCOUNTER — Other Ambulatory Visit: Payer: Self-pay | Admitting: Obstetrics and Gynecology

## 2020-06-24 ENCOUNTER — Telehealth: Payer: Self-pay

## 2020-06-24 DIAGNOSIS — R928 Other abnormal and inconclusive findings on diagnostic imaging of breast: Secondary | ICD-10-CM

## 2020-06-24 NOTE — Telephone Encounter (Signed)
Call to patient. Per DPR, OK to leave message on voicemail.   Left voicemail requesting a return call to [facility] to review benefits for scheduled Endometrial Biopsy with Josefa Half, MD, Cherlynn June.

## 2020-07-08 ENCOUNTER — Other Ambulatory Visit: Payer: Self-pay | Admitting: Student

## 2020-07-08 NOTE — Telephone Encounter (Signed)
This is a [place] pt.  °

## 2020-07-10 NOTE — Progress Notes (Deleted)
GYNECOLOGY  VISIT   HPI: 75 y.o.   Married  Caucasian  female   [phone] with Patient's last menstrual period was 01/22/2011.   here for EMB.    GYNECOLOGIC HISTORY: Patient's last menstrual period was 01/22/2011. Contraception: Tubal/Mirena IUD 12-05-19 Menopausal hormone therapy:  none Last mammogram: 06-20-20 3D calcifications Rt.Br.;Lt.Br.neg. Scheduled for Rt.Diag.07-11-20 Last pap smear: 05/16/18 Neg, 11-18-15 Neg, 06-29-13 Neg        OB History    Gravida  5   Para  3   Term      Preterm      AB  2   Living  3     SAB  1   IAB  1   Ectopic      Multiple      Live Births                 Patient Active Problem List   Diagnosis Date Noted  . 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  . Acute ischemic stroke ([place]) 02/08/2019  . Acute CVA (cerebrovascular accident) ([place]) 02/07/2019  . Coagulopathy ([place]) 02/07/2019  . Anticoagulated 02/07/2019  . Visual changes 02/07/2019  . Leukocytosis 02/07/2019  . Hyperglycemia 02/07/2019  . Diabetes mellitus without complication ([place])   . Hypokalemia 03/19/2017  . HLD (hyperlipidemia) 03/19/2017  . Hematochezia   . Acute blood loss anemia   . Rectal bleeding 03/18/2017  . Acute GI bleeding   . 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 Stroke (cerebrum) ([place]) 01/18/2017  . Intracerebral hemorrhage 01/18/2017  . Elevated troponin 01/17/2017  . A-fib ([place]) 10/27/2016  . Morbid (severe) obesity due to excess calories ([place]) 06/20/2015  . Nephrolithiasis 07/09/2014  . Asthma with bronchitis 02/23/2014  . Aphasia 02/27/2013  . PMB (postmenopausal bleeding) 01/23/2011  . ACUTE BRONCHITIS 08/30/2009  . Essential hypertension 02/22/2007  . Obstructive sleep apnea (adult) (pediatric) 02/22/2007  . ALLERGY 02/22/2007    Past Medical History:  Diagnosis Date  . Allergy   . Anxiety    xanax  . Arthritis    shoulders/hips - tx with OTC meds  . Breast cyst 03/1990   pt denies 12/05/13  .  Bronchitis   . Cataract   . Cervical tab 1972  . 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 Diabetes mellitus without complication ([place])    stated not diabetic but her AIC has been high so she is monitored closely   . Dyspnea   . Dysrhythmia   . Endometrial polyp 04/2000  . Fall 01/2018   nose fracture   . Heart murmur   . Hemorrhoids   . Hypertension   . Kidney stones   . Obesity   . Osteopenia 05/2007  . Paroxysmal atrial fibrillation (H[facility])   . Peripheral neuropathy   . PMB (postmenopausal bleeding) 04/2000  . SAB (spontaneous abortion) 1981  . Sleep apnea    CPAP  . Stroke ([place])   . SVD (spontaneous vaginal delivery) 04/1964, 05/1978   x3  . TIA (transient ischemic attack)     Past Surgical History:  Procedure Laterality Date  . CATARACT EXTRACTION Right 12/07/2018  . CERVICAL DISC SURGERY  2/93  . CESAREAN SECTION  6/83   with BTL  . COLONOSCOPY N/A 03/19/2017   Procedure: COLONOSCOPY;  Surgeon: Doran Stabler, MD;  Location: [facility];  Service: Gastroenterology;  Laterality: N/A;  . DILATION AND CURETTAGE OF UTERUS    . HYSTEROSCOPY  5/02, 01/2011  . JOINT REPLACEMENT    .  LITHOTRIPSY  08/30/2014   with Dr. Jeffie Pollock at [facility]  . TOTAL KNEE ARTHROPLASTY Right 6/08   right  . TOTAL KNEE ARTHROPLASTY Left 4/10   left   . TUBAL LIGATION    . WRIST SURGERY Left 4/07   left    Current Outpatient Medications  Medication Sig Dispense Refill  . acetaminophen (TYLENOL) 500 MG tablet Take 1,000 mg by mouth every 6 (six) hours as needed for moderate pain or headache.     . albuterol (PROAIR HFA) 108 (90 Base) MCG/ACT inhaler INHALE TWO PUFFS BY MOUTH EVERY 6 HOURS AS NEEDED FOR WHEEZING AND SHORTNESS OF BREATH 18 g 12  . ALPRAZolam (XANAX) 0.25 MG tablet Take 0.25 mg by mouth daily as needed for anxiety.     . chlorthalidone (HYGROTON) 25 MG tablet TAKE ONE TABLET BY MOUTH DAILY. 90 tablet 3  . cholecalciferol (VITAMIN D) 1000 UNITS tablet Take 1,000  Units by mouth every morning.     Marland Kitchen ELIQUIS 5 MG TABS tablet TAKE ONE TABLET BY MOUTH TWICE DAILY 60 tablet 6  . guaiFENesin (MUCINEX) 600 MG 12 hr tablet Take 600 mg by mouth daily as needed for cough.    Marland Kitchen levonorgestrel (MIRENA) 20 MCG/24HR IUD 1 Intra Uterine Device (1 each total) by Intrauterine route once for 1 dose. To be placed in the office 1 each 0  . losartan (COZAAR) 100 MG tablet Take 100 mg by mouth daily.    . metoprolol tartrate (LOPRESSOR) 25 MG tablet TAKE ONE TABLET BY MOUTH TWICE DAILY, MAY TAKE EXTRA TABLET AS NEEDED FOR PALPITATIONS 60 tablet 3  . Multiple Vitamins-Minerals (MULTIVITAMIN WITH MINERALS) tablet Take 1 tablet by mouth daily.     Vladimir Faster Glycol-Propyl Glycol (SYSTANE OP) Place 1 drop into both eyes daily as needed (dry eyes).     . potassium chloride SA (KLOR-CON) 20 MEQ tablet TAKE ONE TABLET BY MOUTH DAILY. 90 tablet 3  . silver sulfADIAZINE (SILVADENE) 1 % cream Apply 1 application topically daily.      No current facility-administered medications for this visit.     ALLERGIES: Bactrim [sulfamethoxazole-trimethoprim], Ciprofloxacin, Hydroxyzine hcl, Keflet [cephalexin], Lipitor [atorvastatin], Lisinopril, Spironolactone, Vistaril [hydroxyzine hcl], Erythromycin, Latex, Penicillins, Tape, and Tetracycline  Family History  Problem Relation Age of Onset  . Heart disease Father   . Colon cancer Mother   . Heart disease Mother   . Breast cancer Maternal Aunt   . Colon cancer Maternal Aunt   . Colon cancer Maternal Uncle   . Ehlers-Danlos syndrome Other        neice-being tested 4/15  . Stomach cancer Neg Hx   . Pancreatic cancer Neg Hx   . Endometrial cancer Neg Hx   . Ovarian cancer Neg Hx     Social History   Socioeconomic History  . Marital status: Married    Spouse name: [name]  . Number of children: 3  . Years of education: college  . Highest education level: Not on file  Occupational History  . 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 Smoking status: Never Smoker  . Smokeless tobacco: Never Used  Vaping Use  . Vaping Use: Never used  Substance and Sexual Activity  . Alcohol use: No  . Drug use: No  . Sexual activity: Not Currently    Partners: Male    Birth control/protection: Surgical    Comment: BTL  Other Topics Concern  .  Not on file  Social History Narrative   Patient is married [name])   Patient has three children.   Patient is a homemaker.   Education- College   Right handed.   Caffeine- one coke cola          Social Determinants of Health   Financial Resource Strain: Not on file  Food Insecurity: Not on file  Transportation Needs: Not on file  Physical Activity: Not on file  Stress: Not on file  Social Connections: Not on file  Intimate Partner Violence: Not on file    Review of Systems  PHYSICAL EXAMINATION:    LMP 01/22/2011     General appearance: alert, cooperative and appears stated age Head: Normocephalic, without obvious abnormality, atraumatic Neck: no adenopathy, supple, symmetrical, trachea midline and thyroid normal to inspection and palpation Lungs: clear to auscultation bilaterally Breasts: normal appearance, no masses or tenderness, No nipple retraction or dimpling, No nipple discharge or bleeding, No axillary or supraclavicular adenopathy Heart: regular rate and rhythm Abdomen: soft, non-tender, no masses,  no organomegaly Extremities: extremities normal, atraumatic, no cyanosis or edema Skin: Skin color, texture, turgor normal. No rashes or lesions Lymph nodes: Cervical, supraclavicular, and axillary nodes normal. No abnormal inguinal nodes palpated Neurologic: Grossly normal  Pelvic: External genitalia:  no lesions              Urethra:  normal appearing urethra with no masses, tenderness or lesions              Bartholins and Skenes: normal                 Vagina: normal appearing vagina with normal color and  discharge, no lesions              Cervix: no lesions                Bimanual Exam:  Uterus:  normal size, contour, position, consistency, mobility, non-tender              Adnexa: no mass, fullness, tenderness              Rectal exam: {yes no:314532}.  Confirms.              Anus:  normal sphincter tone, no lesions  Chaperone was present for exam.  ASSESSMENT     PLAN     An After Visit Summary was printed and given to the patient.  ______ minutes face to face time of which over 50% was spent in counseling.

## 2020-07-11 ENCOUNTER — Ambulatory Visit: Payer: Self-pay | Admitting: Obstetrics and Gynecology

## 2020-07-11 ENCOUNTER — Ambulatory Visit
Admission: RE | Admit: 2020-07-11 | Discharge: 2020-07-11 | Disposition: A | Discharge status: Home / Self Care | Payer: BC Managed Care – PPO | Source: Ambulatory Visit | Attending: Obstetrics and Gynecology | Admitting: Obstetrics and Gynecology

## 2020-07-11 ENCOUNTER — Other Ambulatory Visit: Payer: Self-pay

## 2020-07-11 ENCOUNTER — Other Ambulatory Visit: Payer: Self-pay | Admitting: Obstetrics and Gynecology

## 2020-07-11 DIAGNOSIS — R928 Other abnormal and inconclusive findings on diagnostic imaging of breast: Secondary | ICD-10-CM | POA: Diagnosis not present

## 2020-07-11 DIAGNOSIS — R922 Inconclusive mammogram: Secondary | ICD-10-CM | POA: Diagnosis not present

## 2020-07-11 DIAGNOSIS — R921 Mammographic calcification found on diagnostic imaging of breast: Secondary | ICD-10-CM | POA: Diagnosis not present

## 2020-07-14 ENCOUNTER — Telehealth: Payer: Self-pay | Admitting: Obstetrics and Gynecology

## 2020-07-14 NOTE — Telephone Encounter (Signed)
Please contact patient to reschedule her endometrial biopsy.  She is followed for endometrial hyperplasia and is overdue for follow up care.  She did not keep her appointment for her biopsy last week.   I am aware she is also undergoing evaluation for her breast health at this time.

## 2020-07-16 NOTE — Telephone Encounter (Signed)
Call to patient. Message given to patient as seen below from Dr. [name] and patient verbalized understanding. Patient scheduled for 08-29-20 at 1430. Patient agreeable to date and time of appointment. Declined earlier appointments.   Routing to provider and will close encounter.

## 2020-07-19 ENCOUNTER — Ambulatory Visit
Admission: RE | Admit: 2020-07-19 | Discharge: 2020-07-19 | Disposition: A | Discharge status: Home / Self Care | Payer: BC Managed Care – PPO | Source: Ambulatory Visit | Attending: Obstetrics and Gynecology | Admitting: Obstetrics and Gynecology

## 2020-07-19 ENCOUNTER — Other Ambulatory Visit: Payer: Self-pay

## 2020-07-19 DIAGNOSIS — R921 Mammographic calcification found on diagnostic imaging of breast: Secondary | ICD-10-CM | POA: Diagnosis not present

## 2020-07-19 DIAGNOSIS — N6011 Diffuse cystic mastopathy of right breast: Secondary | ICD-10-CM | POA: Diagnosis not present

## 2020-07-19 DIAGNOSIS — N6489 Other specified disorders of breast: Secondary | ICD-10-CM | POA: Diagnosis not present

## 2020-08-20 DIAGNOSIS — R921 Mammographic calcification found on diagnostic imaging of breast: Secondary | ICD-10-CM | POA: Diagnosis not present

## 2020-08-21 HISTORY — PX: BREAST BIOPSY: SHX20

## 2020-08-28 NOTE — Progress Notes (Signed)
GYNECOLOGY  VISIT   HPI: 75 y.o.   Married  Caucasian  female   [phone] with Patient's last menstrual period was 01/22/2011.   here for  endometrial biopsy for follow up of complex endometrial hyperplasia without atypia. She declines the procedure today.  Patient is status post exam under anesthesia with endometrial biopsy 08/30/19 done for postmenopausal bleeding.  Hysteroscopy was not possible due to patient's BMI and anatomy.  Her pathology report showed simple and complex endometrial hyperplasia.   She had consultation with GYN Oncologist Dr. Valarie Cones on 11/06/19, and had successful placement of a Mirena IUD by her on 11/13/19 in the office setting.   States she does not like the Mirena IUD.  Has spotting now, so her bleeding is less.  Wearing a pad all the time.  No real pain.  She has an occasional twinge.  Not able to check the strings.   States she feels stressed from her postmenopausal bleeding visit to the ER 08/11/19 and from receiving the IUD.  She has declined endometrial biopsy follow up to date.  She would like to loose weight.   Patient had right breast biopsies on 07/19/20 showing fibrocystic change and complex sclerosing lesion with calcifications.  She had consultation with general surgery.  She will have a mammogram follow up in 3 months.   She is feeling overwhelmed with her medical issues. She has atrial fibrillation, has had TIAs and a stroke, and is on Eliquis.   States she is not sure if she just wants to decline medical treatments.  Sister died in 01-19-23 last year.  She had atrial fibrillation.  GYNECOLOGIC HISTORY: Patient's last menstrual period was 01/22/2011. Contraception:  Post menopausal & mirena IUD inserted 11-13-19 Menopausal hormone therapy:  none Last mammogram:  07-19-20 & biopsies see reports Last pap smear:   05-16-2018 neg, 11-18-15 neg        OB History     Gravida  5   Para  3   Term      Preterm      AB  2   Living  3       SAB  1   IAB  1   Ectopic      Multiple      Live Births                 Patient Active Problem List   Diagnosis Date Noted   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   Acute ischemic stroke ([place]) 02/08/2019   Acute CVA (cerebrovascular accident) ([place]) 02/07/2019   Coagulopathy ([place]) 02/07/2019   Anticoagulated 02/07/2019   Visual changes 02/07/2019   Leukocytosis 02/07/2019   Hyperglycemia 02/07/2019   Diabetes mellitus without complication ([place])    Hypokalemia 03/19/2017   HLD (hyperlipidemia) 03/19/2017   Hematochezia    Acute blood loss anemia    Rectal bleeding 03/18/2017   Acute GI bleeding    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   Stroke (cerebrum) ([place]) 01/18/2017   Intracerebral hemorrhage 01/18/2017   Elevated troponin 01/17/2017   A-fib ([place]) 10/27/2016   Morbid (severe) obesity due to excess calories ([place]) 06/20/2015   Nephrolithiasis 07/09/2014   Asthma with bronchitis 02/23/2014   Aphasia 02/27/2013   PMB (postmenopausal bleeding) 01/23/2011   ACUTE BRONCHITIS 08/30/2009   Essential hypertension 02/22/2007   Obstructive sleep apnea (adult) (pediatric) 02/22/2007   ALLERGY 02/22/2007    Past Medical History:  Diagnosis Date   Allergy    Anxiety  xanax   Arthritis    shoulders/hips - tx with OTC meds   Breast cyst 03/1990   pt denies 12/05/13   Bronchitis    Cataract    Cervical tab 1972   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   Diabetes mellitus without complication ([place])    stated not diabetic but her AIC has been high so she is monitored closely    Dyspnea    Dysrhythmia    Endometrial polyp 04/2000   Fall 01/2018   nose fracture    Heart murmur    Hemorrhoids    Hypertension    Kidney stones    Obesity    Osteopenia 05/2007   Paroxysmal atrial fibrillation ([place])    Peripheral neuropathy    PMB (postmenopausal bleeding) 04/2000   SAB (spontaneous abortion) 1981   Sleep apnea    CPAP    Stroke ([place])    SVD (spontaneous vaginal delivery) 04/1964, 05/1978   x3   TIA (transient ischemic attack)     Past Surgical History:  Procedure Laterality Date   CATARACT EXTRACTION Right 12/07/2018   CERVICAL [place] SURGERY  2/93   CESAREAN SECTION  6/83   with BTL   COLONOSCOPY N/A 03/19/2017   Procedure: COLONOSCOPY;  Surgeon: Doran Stabler, MD;  Location: [facility];  Service: Gastroenterology;  Laterality: N/A;   DILATION AND CURETTAGE OF UTERUS     HYSTEROSCOPY  5/02, 01/2011   JOINT REPLACEMENT     LITHOTRIPSY  08/30/2014   with Dr. Jeffie Pollock at [facility] Right 6/08   right   TOTAL KNEE ARTHROPLASTY Left 4/10   left    TUBAL LIGATION     WRIST SURGERY Left 4/07   left    Current Outpatient Medications  Medication Sig Dispense Refill   acetaminophen (TYLENOL) 500 MG tablet Take 1,000 mg by mouth every 6 (six) hours as needed for moderate pain or headache.      albuterol (PROAIR HFA) 108 (90 Base) MCG/ACT inhaler INHALE TWO PUFFS BY MOUTH EVERY 6 HOURS AS NEEDED FOR WHEEZING AND SHORTNESS OF BREATH 18 g 12   ALPRAZolam (XANAX) 0.25 MG tablet Take 0.25 mg by mouth daily as needed for anxiety.      chlorthalidone (HYGROTON) 25 MG tablet TAKE ONE TABLET BY MOUTH DAILY. 90 tablet 3   cholecalciferol (VITAMIN D) 1000 UNITS tablet Take 1,000 Units by mouth every morning.      ELIQUIS 5 MG TABS tablet TAKE ONE TABLET BY MOUTH TWICE DAILY 60 tablet 6   guaiFENesin (MUCINEX) 600 MG 12 hr tablet Take 600 mg by mouth daily as needed for cough.     levonorgestrel (MIRENA) 20 MCG/24HR IUD 1 Intra Uterine Device (1 each total) by Intrauterine route once for 1 dose. To be placed in the office 1 each 0   losartan (COZAAR) 100 MG tablet Take 100 mg by mouth daily.     metoprolol tartrate (LOPRESSOR) 25 MG tablet TAKE ONE TABLET BY MOUTH TWICE DAILY, MAY TAKE EXTRA TABLET AS NEEDED FOR PALPITATIONS 60 tablet 3   Multiple Vitamins-Minerals (MULTIVITAMIN WITH  MINERALS) tablet Take 1 tablet by mouth daily.      Polyethyl Glycol-Propyl Glycol (SYSTANE OP) Place 1 drop into both eyes daily as needed (dry eyes).      potassium chloride SA (KLOR-CON) 20 MEQ tablet TAKE ONE TABLET BY MOUTH DAILY. 90 tablet 3   silver sulfADIAZINE (SILVADENE) 1 % cream Apply 1 application topically daily.  No current facility-administered medications for this visit.     ALLERGIES: Bactrim [sulfamethoxazole-trimethoprim], Ciprofloxacin, Hydroxyzine hcl, Keflet [cephalexin], Lipitor [atorvastatin], Lisinopril, Spironolactone, Vistaril [hydroxyzine hcl], Erythromycin, Latex, Penicillins, Tape, and Tetracycline  Family History  Problem Relation Age of Onset   Heart disease Father    Colon cancer Mother    Heart disease Mother    Breast cancer Maternal Aunt    Colon cancer Maternal Aunt    Colon cancer Maternal Uncle    Ehlers-Danlos syndrome Other        neice-being tested 4/15   Stomach cancer Neg Hx    Pancreatic cancer Neg Hx    Endometrial cancer Neg Hx    Ovarian cancer Neg Hx     Social History   Socioeconomic History   Marital status: Married    Spouse name: [name]   Number of children: 3   Years of education: college   Highest education level: Not on file  Occupational History   Occupation: Scientist, 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   Patient is a homemaker.   Education- College   Right handed.   Caffeine- one coke cola          Social Determinants of Health   Financial Resource Strain: Not on file  Food Insecurity: Not on  file  Transportation Needs: Not on file  Physical Activity: Not on file  Stress: Not on file  Social Connections: Not on file  Intimate Partner Violence: Not on file    Review of Systems  Constitutional: Negative.   HENT: Negative.    Eyes: Negative.   Respiratory: Negative.    Cardiovascular: Negative.   Gastrointestinal: Negative.   Endocrine: Negative.   Genitourinary: Negative.   Musculoskeletal: Negative.   Skin: Negative.   Allergic/Immunologic: Negative.   Neurological: Negative.   Hematological: Negative.   Psychiatric/Behavioral: Negative.     PHYSICAL EXAMINATION:    BP 140/90   Pulse 88   Resp 16   LMP 01/22/2011     General appearance: alert, cooperative and appears stated age  ASSESSMENT  Complex endometrial hyperplasia without atypia.  Unmonitored.  Mirena IUD.  Recent right breat biopsies.  On Eliquis. Situational stress.   PLAN  Patient and I reviewed her medical status with her gynecologic and breast care.  Follow up sampling of the endometrium is recommended to monitor endometrial hyperplasia which is a histologic diagnosis and not made through pelvic ultrasound.  We discussed office sampling versus sampling in the OR setting.  Patient declines endometrial biopsy and accepts follow up consultation with Dr. Berline Lopes.  I did offer her counseling resources for her stress about her medical conditions.  Brochures for [employer] to patient.   30 min  total time was spent for this patient encounter, including preparation, face-to-face counseling with the patient, coordination of care, and documentation of the encounter.

## 2020-08-29 ENCOUNTER — Ambulatory Visit (INDEPENDENT_AMBULATORY_CARE_PROVIDER_SITE_OTHER): Payer: BC Managed Care – PPO | Admitting: Obstetrics and Gynecology

## 2020-08-29 ENCOUNTER — Encounter: Payer: Self-pay | Admitting: Obstetrics and Gynecology

## 2020-08-29 ENCOUNTER — Other Ambulatory Visit: Payer: Self-pay

## 2020-08-29 VITALS — BP 140/90 | HR 88 | Resp 16

## 2020-08-29 DIAGNOSIS — F439 Reaction to severe stress, unspecified: Secondary | ICD-10-CM

## 2020-08-29 DIAGNOSIS — N8501 Benign endometrial hyperplasia: Secondary | ICD-10-CM

## 2020-09-02 ENCOUNTER — Telehealth: Payer: Self-pay | Admitting: Obstetrics and Gynecology

## 2020-09-02 ENCOUNTER — Telehealth: Payer: Self-pay | Admitting: *Deleted

## 2020-09-02 NOTE — Telephone Encounter (Signed)
Please make a follow up appointment for patient to see Dr. Valarie Cones, GYN Oncology, regarding complex endometrial hyperplasia without atypia.  Dr. Berline Lopes placed the patient's Mirena IUD 11/13/19, and patient is declining endometrial biopsy to date for follow up.  She accepts consultation with Dr. Berline Lopes.

## 2020-09-02 NOTE — Telephone Encounter (Signed)
Patient scheduled on 09/17/20 with Dr.Tucker

## 2020-09-02 NOTE — Telephone Encounter (Signed)
Per Dr [name] scheduled the patient to see Dr Berline Lopes on 6/28. [place]

## 2020-09-02 NOTE — Telephone Encounter (Signed)
Staff message sent to [facility] at Gyn-oncology to call and schedule and let me know time and date.

## 2020-09-11 ENCOUNTER — Ambulatory Visit: Payer: BC Managed Care – PPO | Admitting: Cardiology

## 2020-09-15 NOTE — Progress Notes (Signed)
Gynecologic Oncology Return Clinic Visit  09/17/20  Reason for Visit: follow-up of CAH  Treatment History: 08/30/19: EMB in the OR given scant tissue obtained at office visit and difficulty with exam due to body habitus. 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  Patient was prescribed Megace. Took this for 9 days but given side effects, discontinued.  11/13/19: Mirena IUD placed in clinic 12/05/19: Pelvic ultrasound confirmed expected location of IUD in endometrium of upper uterine segment 02/27/20: seen in the office with Dr. [name]. still having some spotting.  Had heavy bleeding in august, Eliquis was temporarily stopped. EMB declined.  08/29/20: see in office with Dr. [name]. Continued spotting, no heavy bleeding. Declined EMB again.   Interval History: The patient presents today for follow-up of her simple and complex hyperplasia without atypia.  She has been seen multiple times by Dr. [name] and has declined endometrial biopsy.  She had bleeding until April of this year and denies any significant vaginal bleeding since.  She sometimes will have yellow discharge or what she describes as brown/pink discharge.  She denies any significant cramping or pelvic pain.  She endorses a good appetite without nausea or emesis.  She denies any change to bowel or bladder function.  Had a mammogram in 05/2020, ultimately underwent biopsy in 06/2020. No malignancy noted.  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   Diabetes mellitus without complication ([place])    stated not diabetic but her AIC has been high so she is monitored closely    Dyspnea    Dysrhythmia    Endometrial polyp 04/2000   Fall 01/2018   nose fracture    Heart murmur     Hemorrhoids    Hypertension    Kidney stones    Obesity    Osteopenia 05/2007   Paroxysmal atrial fibrillation ([place])    Peripheral neuropathy    PMB (postmenopausal bleeding) 04/2000   SAB (spontaneous abortion) 1981   Sleep apnea    CPAP   Stroke ([place])    SVD (spontaneous vaginal delivery) 04/1964, 05/1978   x3   TIA (transient ischemic attack)     Past Surgical History:  Procedure Laterality Date   BREAST BIOPSY Right 08/2020   CATARACT EXTRACTION Right 12/07/2018   CERVICAL [place] SURGERY  04/1991   CESAREAN SECTION  08/1981   with BTL   COLONOSCOPY N/A 03/19/2017   Procedure: COLONOSCOPY;  Surgeon: Doran Stabler, MD;  Location: [facility];  Service: Gastroenterology;  Laterality: N/A;   DILATION AND CURETTAGE OF UTERUS     HYSTEROSCOPY  5/02, 01/2011   JOINT REPLACEMENT     LITHOTRIPSY  08/30/2014   with Dr. Jeffie Pollock at [place] Right 08/2006   right   TOTAL KNEE ARTHROPLASTY Left 06/2008   left    TUBAL LIGATION     WRIST SURGERY Left 06/2005   left    Family History  Problem Relation Age of Onset   Heart disease Father    Colon cancer Mother    Heart disease Mother    Breast cancer Maternal Aunt    Colon cancer Maternal Aunt    Colon  cancer Maternal Uncle    Ehlers-Danlos syndrome Other        neice-being tested 4/15   Stomach cancer Neg Hx    Pancreatic cancer Neg Hx    Endometrial cancer Neg Hx    Ovarian cancer Neg Hx     Social History   Socioeconomic History   Marital status: Married    Spouse name: [name]   Number of children: 3   Years of education: college   Highest education level: Not on file  Occupational History   Occupation: Scientist, 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   Patient is a homemaker.   Education- College   Right handed.   Caffeine- one coke cola          Social Determinants of Health   Financial Resource Strain: Not on file  Food Insecurity: Not on file  Transportation Needs: Not on file  Physical Activity: Not on file  Stress: Not on file  Social Connections: Not on file    Current Medications:  Current Outpatient Medications:    acetaminophen (TYLENOL) 500 MG tablet, Take 1,000 mg by mouth every 6 (six) hours as needed for moderate pain or headache. , Disp: , Rfl:    chlorthalidone (HYGROTON) 25 MG tablet, TAKE ONE TABLET BY MOUTH DAILY., Disp: 90 tablet, Rfl: 3   cholecalciferol (VITAMIN D) 1000 UNITS tablet, Take 1,000 Units by mouth every morning. , Disp: , Rfl:    ELIQUIS 5 MG TABS tablet, TAKE ONE TABLET BY MOUTH TWICE DAILY, Disp: 60 tablet, Rfl: 6   guaiFENesin (MUCINEX) 600 MG 12 hr tablet, Take 600 mg by mouth daily as needed for cough., Disp: , Rfl:    levonorgestrel (MIRENA) 20 MCG/DAY IUD, 1 each by Intrauterine route once., Disp: , Rfl:    losartan (COZAAR) 100 MG tablet, Take 100 mg by mouth daily., Disp: , Rfl:    metoprolol tartrate (LOPRESSOR) 25 MG tablet, TAKE ONE TABLET BY MOUTH TWICE DAILY, MAY TAKE EXTRA TABLET AS NEEDED FOR PALPITATIONS, Disp: 60 tablet, Rfl: 3   Multiple Vitamins-Minerals (MULTIVITAMIN WITH MINERALS) tablet, Take 1 tablet by mouth daily. , Disp: , Rfl:    Polyethyl Glycol-Propyl Glycol (SYSTANE OP), Place 1 drop into both eyes daily as needed (dry eyes). , Disp: , Rfl:    potassium chloride SA (KLOR-CON) 20 MEQ tablet, TAKE ONE TABLET BY MOUTH DAILY., Disp: 90 tablet, Rfl: 3   albuterol (PROAIR HFA) 108 (90 Base) MCG/ACT inhaler, INHALE TWO PUFFS BY MOUTH EVERY 6 HOURS AS NEEDED FOR WHEEZING AND SHORTNESS OF  BREATH (Patient not taking: Reported on 09/16/2020), Disp: 18 g, Rfl: 12   ALPRAZolam (XANAX) 0.25 MG tablet, Take 0.25 mg by mouth daily as needed for anxiety.  (Patient not taking: Reported on 09/16/2020), Disp: , Rfl:   Review of Systems: Pertinent positives include anxiety and vaginal discharge. Denies appetite changes, fevers, chills, fatigue, unexplained weight changes. Denies hearing loss, neck lumps or masses, mouth sores, ringing in ears  or voice changes. Denies cough or wheezing.  Denies shortness of breath. Denies chest pain or palpitations. Denies leg swelling. Denies abdominal distention, pain, blood in stools, constipation, diarrhea, nausea, vomiting, or early satiety. Denies pain with intercourse, dysuria, frequency, hematuria or incontinence. Denies hot flashes, pelvic pain or vaginal bleeding.   Denies joint pain, back pain or muscle pain/cramps. Denies itching, rash, or wounds. Denies dizziness, headaches, numbness or seizures. Denies swollen lymph nodes or glands, denies easy bruising or bleeding. Denies depression, confusion, or decreased concentration.  Physical Exam: BP (!) 145/85 (BP Location: Left Arm, Patient Position: Sitting)   Pulse 80   Temp 98 F (36.7 C) (Tympanic)   Resp 20   Ht 4\' 11"  (1.499 m)   Wt 255 lb (115.7 kg)   LMP 01/22/2011   SpO2 97%   BMI 51.50 kg/m  General: Alert, oriented, no acute distress. HEENT: Normocephalic, atraumatic, sclera anicteric. Chest: Unlabored breathing on room air.  Laboratory & Radiologic Studies: None new  Assessment & Plan: Nicole Cabrera is a 75 y.o. 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  The patient voiced some frustration about her initial care at the time of diagnosis of her hyperplasia.  It sounds like she has some missed trust and is in general  a little fat up with having to see so many doctors about different medical problems.  I spent quite a bit of time talking about her diagnosis, her treatment plan, and follow-up with both the patient and her husband today.  We discussed that overall the change in her bleeding pattern is a favorable sign that the progesterone is having an effect on the endometrial tissue although without sampling and confirmation on a microscopic level, I cannot say for sure that her tissue is responding.  I recommended endometrial biopsy today.  If endometrial biopsy shows response to progesterone or no progression, then I think it would be very reasonable to space out endometrial biopsies to no more than yearly unless the patient were to have a change in symptoms such as increased bleeding or pelvic pain.  The patient is very weary about having this done today but was amenable to rescheduling a visit in early August.  I have asked her to take Tylenol before she comes in to help limit any cramping.  38 minutes of total time was spent for this patient encounter, including preparation, face-to-face counseling with the patient and coordination of care, and documentation of the encounter.  Jeral Pinch, MD  Division of Gynecologic Oncology  Department of Obstetrics and Gynecology  [facility]

## 2020-09-16 ENCOUNTER — Encounter: Payer: Self-pay | Admitting: Gynecologic Oncology

## 2020-09-17 ENCOUNTER — Inpatient Hospital Stay: Payer: BC Managed Care – PPO | Attending: Gynecologic Oncology | Admitting: Gynecologic Oncology

## 2020-09-17 ENCOUNTER — Other Ambulatory Visit: Payer: Self-pay

## 2020-09-17 ENCOUNTER — Encounter: Payer: Self-pay | Admitting: Gynecologic Oncology

## 2020-09-17 VITALS — BP 145/85 | HR 80 | Temp 98.0°F | Resp 20 | Ht 59.0 in | Wt 255.0 lb

## 2020-09-17 DIAGNOSIS — E669 Obesity, unspecified: Secondary | ICD-10-CM | POA: Insufficient documentation

## 2020-09-17 DIAGNOSIS — I10 Essential (primary) hypertension: Secondary | ICD-10-CM | POA: Insufficient documentation

## 2020-09-17 DIAGNOSIS — Z79899 Other long term (current) drug therapy: Secondary | ICD-10-CM | POA: Insufficient documentation

## 2020-09-17 DIAGNOSIS — I480 Paroxysmal atrial fibrillation: Secondary | ICD-10-CM | POA: Diagnosis not present

## 2020-09-17 DIAGNOSIS — E1136 Type 2 diabetes mellitus with diabetic cataract: Secondary | ICD-10-CM | POA: Insufficient documentation

## 2020-09-17 DIAGNOSIS — Z8673 Personal history of transient ischemic attack (TIA), and cerebral infarction without residual deficits: Secondary | ICD-10-CM | POA: Diagnosis not present

## 2020-09-17 DIAGNOSIS — Z7901 Long term (current) use of anticoagulants: Secondary | ICD-10-CM | POA: Diagnosis not present

## 2020-09-17 DIAGNOSIS — Z6841 Body Mass Index (BMI) 40.0 and over, adult: Secondary | ICD-10-CM | POA: Diagnosis not present

## 2020-09-17 DIAGNOSIS — N8501 Benign endometrial hyperplasia: Secondary | ICD-10-CM | POA: Insufficient documentation

## 2020-09-17 NOTE — Patient Instructions (Signed)
It was good to see you today.  I will see you August 5 in the afternoon for a biopsy.  Remember to take some Tylenol before you come to help decrease any cramping you may have from the biopsy.

## 2020-10-10 ENCOUNTER — Telehealth: Payer: Self-pay

## 2020-10-10 NOTE — Telephone Encounter (Signed)
Attempted to call Rudy back in regards to Eliquis. Will attempt to call again.

## 2020-10-10 NOTE — Telephone Encounter (Signed)
Received phone call from [facility] stating she had spoken with her cardiologist (Dr. Harl Bowie) and mentioned her upcoming endometrial biopsy on 8/5 with Dr. Berline Lopes. Patient states that Dr. Harl Bowie would like Dr. Lulu Riding office to reach out to them in regards to guidance of patients eliquis prior to the biopsy.  Instructed patient that we will contact Dr. Nelly Laurence office and then reach back out to her. Patient verbalized understanding.

## 2020-10-11 NOTE — Telephone Encounter (Signed)
LM for Nicole Cabrera to call the office to discuss Eliquis dosing prior to biopsy on 10-25-20.

## 2020-10-15 NOTE — Telephone Encounter (Signed)
Attempted to call Ms. Beach in regards to her Eliquis. Left message for her to please call the office.

## 2020-10-21 ENCOUNTER — Telehealth: Payer: Self-pay | Admitting: Cardiology

## 2020-10-21 ENCOUNTER — Telehealth: Payer: Self-pay

## 2020-10-21 NOTE — Telephone Encounter (Signed)
Typically the decision to hold anticoagulation is left up to the cardiology practice.  However, in this case, since the surgeon's office never sent over a clearance and are not requesting she hold her blood thinner, it does not matter.  Patient should continue taking Eliquis as directed.

## 2020-10-21 NOTE — Telephone Encounter (Signed)
Our office has never received a clearance request for pt. See notes from [place] stating Dr. Berline Lopes will be the MD who will need to clear for the Eliquis.

## 2020-10-21 NOTE — Telephone Encounter (Signed)
Told Nicole Cabrera that Dr. Berline Lopes said that she can continue to take Eliquis prior to biopsy 10-25-20.  Pt verbalized understanding.

## 2020-10-21 NOTE — Telephone Encounter (Addendum)
Secure Chat sent to Baruch Merl, Sharin Grave and Georgette Dover because per phone note from 7/21 they had attempted to get in touch with pt regarding Eliquis.  Clarified with them that today's phone note from me should have said if Dr Berline Lopes is requesting Eliquis to be held prior to to procedure then a clearance request should be sent over. Louise, confirmed that Dr Berline Lopes does not need Eliquis to be held prior to procedure and that they would attempt to reach out to patient again and update her.

## 2020-10-21 NOTE — Telephone Encounter (Signed)
Called and spoke to pt. Nicole Cabrera stated she thought our office would be the ones to decide if she should come off her her Eiquis because that is what she was told last week.  Made her aware that Dr. Berline Lopes would be the one to decide if she would need to hold her Eliquis and if she needed to hold her Eliquis prior to procedure then Dr. Charisse March office would need to fax over a clearance note to our office.  Pt verbalized understanding.

## 2020-10-21 NOTE — Telephone Encounter (Signed)
New message     Patient is having a biopsy done with Jeral Pinch, patient wants to know if she needs to stop her blood thinner or the biopsy? The office doing the procedure said that she does not need to stop it?

## 2020-10-21 NOTE — Telephone Encounter (Signed)
ENCOUNTER OPENED IN ERROR

## 2020-10-23 ENCOUNTER — Other Ambulatory Visit: Payer: Self-pay | Admitting: *Deleted

## 2020-10-23 MED ORDER — METOPROLOL TARTRATE 25 MG PO TABS: ORAL_TABLET | ORAL | 3 refills | Status: DC

## 2020-10-25 ENCOUNTER — Inpatient Hospital Stay: Payer: BC Managed Care – PPO | Attending: Gynecologic Oncology | Admitting: Gynecologic Oncology

## 2020-10-25 ENCOUNTER — Other Ambulatory Visit: Payer: Self-pay

## 2020-10-25 VITALS — BP 159/77 | HR 75 | Temp 97.6°F | Resp 20 | Ht 59.0 in | Wt 254.0 lb

## 2020-10-25 DIAGNOSIS — N8501 Benign endometrial hyperplasia: Secondary | ICD-10-CM | POA: Insufficient documentation

## 2020-10-25 NOTE — Patient Instructions (Signed)
I will call you with biopsy results once I have them back, hopefully next week.  We will discuss next steps if any are needed when I get the results.

## 2020-10-25 NOTE — Addendum Note (Signed)
Addended by: Joylene John D on: 10/25/2020 03:48 PM   Modules accepted: Orders

## 2020-10-25 NOTE — Progress Notes (Signed)
Gynecologic Oncology Return Clinic Visit  10/25/2020  Reason for Visit: Endometrial biopsy  Treatment History: 08/30/19: EMB in the OR given scant tissue obtained at office visit and difficulty with exam due to body habitus. 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 Patient was prescribed Megace. Took this for 9 days but given side effects, discontinued. 11/13/19: Mirena IUD placed in clinic 12/05/19: Pelvic ultrasound confirmed expected location of IUD in endometrium of upper uterine segment 02/27/20: seen in the office with Dr. [name]. still having some spotting.  Had heavy bleeding in august, Eliquis was temporarily stopped. EMB declined. 08/29/20: see in office with Dr. [name]. Continued spotting, no heavy bleeding. Declined EMB again.   Interval History: Patient presents today for endometrial biopsy.  She notes about a week of light bleeding.  She had the same weeklong episode of light bleeding about a month ago.  She describes this as starting off with bright red bleeding in the morning and tapering off to very mild spotting towards the end of the day.  Denies any associated cramping, pain, or passage of clots.  Denies any other changes since her recent visit with me at the end of June.  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   Diabetes mellitus without complication ([place])    stated not diabetic but her AIC has been high so she is monitored closely    Dyspnea    Dysrhythmia    Endometrial polyp 04/2000   Fall 01/2018   nose fracture    Heart murmur    Hemorrhoids    Hypertension    Kidney stones    Obesity    Osteopenia 05/2007   Paroxysmal atrial fibrillation ([place])    Peripheral neuropathy    PMB  (postmenopausal bleeding) 04/2000   SAB (spontaneous abortion) 1981   Sleep apnea    CPAP   Stroke ([place])    SVD (spontaneous vaginal delivery) 04/1964, 05/1978   x3   TIA (transient ischemic attack)     Past Surgical History:  Procedure Laterality Date   BREAST BIOPSY Right 08/2020   CATARACT EXTRACTION Right 12/07/2018   CERVICAL [place] SURGERY  04/1991   CESAREAN SECTION  08/1981   with BTL   COLONOSCOPY N/A 03/19/2017   Procedure: COLONOSCOPY;  Surgeon: Doran Stabler, MD;  Location: [place];  Service: Gastroenterology;  Laterality: N/A;   DILATION AND CURETTAGE OF UTERUS     HYSTEROSCOPY  5/02, 01/2011   JOINT REPLACEMENT     LITHOTRIPSY  08/30/2014   with Dr. Jeffie Pollock at [place] Right 08/2006   right   TOTAL KNEE ARTHROPLASTY Left 06/2008   left    TUBAL LIGATION     WRIST SURGERY Left 06/2005   left    Family History  Problem Relation Age of Onset   Heart disease Father    Colon cancer Mother    Heart disease Mother    Breast cancer Maternal Aunt    Colon cancer Maternal Aunt    Colon cancer Maternal Uncle    Ehlers-Danlos syndrome Other        neice-being tested 4/15   Stomach cancer Neg  Hx    Pancreatic cancer Neg Hx    Endometrial cancer Neg Hx    Ovarian cancer Neg Hx     Social History   Socioeconomic History   Marital status: Married    Spouse name: [name]   Number of children: 3   Years of education: college   Highest education level: Not on file  Occupational History   Occupation: Scientist, 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   Patient is a homemaker.   Education- College   Right handed.   Caffeine- one coke cola          Social Determinants of Health   Financial Resource Strain: Not on file  Food Insecurity: Not on file  Transportation Needs: Not on file  Physical Activity: Not on file  Stress: Not on file  Social Connections: Not on file    Current Medications:  Current Outpatient Medications:    acetaminophen (TYLENOL) 500 MG tablet, Take 1,000 mg by mouth every 6 (six) hours as needed for moderate pain or headache. , Disp: , Rfl:    albuterol (PROAIR HFA) 108 (90 Base) MCG/ACT inhaler, INHALE TWO PUFFS BY MOUTH EVERY 6 HOURS AS NEEDED FOR WHEEZING AND SHORTNESS OF BREATH (Patient not taking: Reported on 09/16/2020), Disp: 18 g, Rfl: 12   ALPRAZolam (XANAX) 0.25 MG tablet, Take 0.25 mg by mouth daily as needed for anxiety.  (Patient not taking: Reported on 09/16/2020), Disp: , Rfl:    chlorthalidone (HYGROTON) 25 MG tablet, TAKE ONE TABLET BY MOUTH DAILY., Disp: 90 tablet, Rfl: 3   cholecalciferol (VITAMIN D) 1000 UNITS tablet, Take 1,000 Units by mouth every morning. , Disp: , Rfl:    ELIQUIS 5 MG TABS tablet, TAKE ONE TABLET BY MOUTH TWICE DAILY, Disp: 60 tablet, Rfl: 6   guaiFENesin (MUCINEX) 600 MG 12 hr tablet, Take 600 mg by mouth daily as needed for cough., Disp: , Rfl:    levonorgestrel (MIRENA) 20 MCG/DAY IUD, 1 each by Intrauterine route once., Disp: , Rfl:    losartan (COZAAR) 100 MG tablet, Take 100 mg by mouth daily., Disp: , Rfl:    metoprolol tartrate (LOPRESSOR) 25 MG tablet, TAKE ONE TABLET BY MOUTH TWICE DAILY, MAY TAKE EXTRA TABLET AS NEEDED FOR PALPITATIONS, Disp: 60 tablet, Rfl: 3   Multiple Vitamins-Minerals (MULTIVITAMIN WITH MINERALS) tablet, Take 1 tablet by mouth daily. , Disp: , Rfl:    Polyethyl Glycol-Propyl Glycol (SYSTANE OP), Place 1 drop into both eyes daily as needed (dry eyes). , Disp: , Rfl:    potassium chloride SA (KLOR-CON) 20 MEQ  tablet, TAKE ONE TABLET BY MOUTH DAILY., Disp: 90 tablet, Rfl: 3  Review of Systems: Pertinent positives as per HPI Denies appetite changes, fevers, chills, fatigue, unexplained weight changes. Denies hearing loss, neck lumps or masses, mouth sores, ringing in ears or voice changes. Denies cough or wheezing.  Denies shortness of breath. Denies chest pain or palpitations. Denies leg swelling. Denies abdominal distention, pain, blood in  stools, constipation, diarrhea, nausea, vomiting, or early satiety. Denies pain with intercourse, dysuria, frequency, hematuria or incontinence. Denies hot flashes, pelvic pain or vaginal discharge.   Denies joint pain, back pain or muscle pain/cramps. Denies itching, rash, or wounds. Denies dizziness, headaches, numbness or seizures. Denies swollen lymph nodes or glands, denies easy bruising or bleeding. Denies anxiety, depression, confusion, or decreased concentration.  Physical Exam: BP (!) 159/77 (BP Location: Right Wrist, Patient Position: Sitting)   Pulse 75   Temp 97.6 F (36.4 C) (Tympanic)   Resp 20   Ht _0  (1.499 m)   Wt 254 lb (115.2 kg)   LMP 01/22/2011   SpO2 97%   BMI 51.30 kg/m  General: Alert, oriented, no acute distress. HEENT: Normocephalic, atraumatic, sclera anicteric. Chest: Unlabored breathing on room air Abdomen: Obese, soft, nontender.  Normoactive bowel sounds.  No masses or hepatosplenomegaly appreciated.   Extremities: Grossly normal range of motion.  Warm, well perfused.  1+ edema bilaterally.  GU: Normal appearing external genitalia without erythema, excoriation, or lesions.  Speculum exam reveals mildly atrophic vaginal mucosa, with both medium size as well as large speculum, patient had discomfort on exam and cervix could not be visualized.  Bimanual exam reveals several centimeters of IUD strings that are palpated.  Cervix able to be palpated anteriorly behind the pubic symphysis.  No nodularity.  Procedure  note Preoperative diagnosis: History of simple and complex hyperplasia without atypia Postoperative diagnosis: same as above Procedure: Endometrial biopsy Physician: Berline Lopes MD Estimated blood loss: Minimal Specimen: Endometrial biopsy Procedure: After the procedure was discussed with the patient including risks and alternatives, the patient gave verbal consent.  Speculum was placed in the vagina and the cervix was not able to be visualized despite multiple attempts with different sized speculums.  At this point, a bimanual exam was performed and the cervix as well as IUD strings were palpated.  Decision made to proceed with biopsy over my finger.  With the patient's legs elevated, an endometrial P[place]elle was passed over my finger and into the cervix until resistance met.  I suspect this was to a depth of 6-7 cm.  1 pass was performed given patient's discomfort with an adequate tissue sample noted.  This was placed in formalin to be sent to pathology.  Overall the patient tolerated the procedure relatively well.  Laboratory & Radiologic Studies: None new  Assessment & Plan: Nicole Cabrera is a 75 y.o. 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  Endometrial biopsy performed today with some difficulty secondary to patient's discomfort and the difficulty of her exam due to body habitus.  Sample was obtained and will be sent to pathology.  I will call her early next week with the results.  We discussed that my recommendations regarding further management will depend on the results.  If endometrial biopsy shows response to progesterone or regression, then I think it is reasonable to biopsy only with changes to her bleeding.  If no endometrial sample obtained on biopsy today, then we will talk about the utility of an outpatient procedure under some light  anesthesia to sample the lining.   28 minutes of total time was spent for this patient encounter, including preparation, face-to-face counseling with the patient and coordination of care, and documentation of the encounter.  Jeral Pinch, MD  Division of Gynecologic Oncology  Department  of Obstetrics and Gynecology  [facility]

## 2020-10-28 ENCOUNTER — Ambulatory Visit: Payer: BC Managed Care – PPO | Admitting: Neurology

## 2020-10-28 LAB — SURGICAL PATHOLOGY

## 2020-10-29 ENCOUNTER — Telehealth: Payer: Self-pay | Admitting: Gynecologic Oncology

## 2020-10-29 NOTE — Telephone Encounter (Signed)
Attempted to call the patient multiple times on her cell phone.  It sounded like someone answered, but I could not hear her voice on the other end.  Jeral Pinch MD Gynecologic Oncology

## 2020-10-31 ENCOUNTER — Telehealth: Payer: Self-pay | Admitting: Gynecologic Oncology

## 2020-10-31 ENCOUNTER — Encounter: Payer: Self-pay | Admitting: Cardiology

## 2020-10-31 ENCOUNTER — Telehealth: Payer: Self-pay

## 2020-10-31 ENCOUNTER — Ambulatory Visit (INDEPENDENT_AMBULATORY_CARE_PROVIDER_SITE_OTHER): Payer: BC Managed Care – PPO | Admitting: Cardiology

## 2020-10-31 VITALS — BP 160/90 | HR 76 | Ht 59.0 in | Wt 261.0 lb

## 2020-10-31 DIAGNOSIS — I480 Paroxysmal atrial fibrillation: Secondary | ICD-10-CM | POA: Diagnosis not present

## 2020-10-31 DIAGNOSIS — I10 Essential (primary) hypertension: Secondary | ICD-10-CM

## 2020-10-31 NOTE — Progress Notes (Signed)
Clinical Summary Nicole Cabrera is a 75 y.o.female seen today for follow up of the following medical problems.    1. Afib - new diagnosis during  ER visit 09/14/16  - vaginal bleeding has been an ongoing issue, followed by ob/gyn - has been on eliquis - still spotting at times. Hgb was 14.3 02/28/20.   - no recent bleeding, had some vaginal bleeding about 2 weeks ago - no recent palpitations.  - remains on eliquis   2. Hyperlipidemia - neurologist lowered statin, now off - muscle cramps/body aches on atorvastatin - she has not been willing to try alternative statin   - 01/2018 TC 186 TG 112 HDL 45 LDL 119   09/2019 TC 165 TG 153 LDL 100 - has been restistant to retyring statin   3. OSA - uses CPAP machine, followed by Dr Annamaria Boots.        4. HTN   - home bp's typically 140s-160s/80s    4. 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 She had refused anticoag prior to that.  - now on eliquis.    5. CAD - 12/2016 coronary CTA: coronary calcium score of 7, mild diffuse nonobstructive disease - elevated trop 12/2016 during admit with cardioembolic stroke, likely embolized to coronaries as well     6. Pulmonary nodules - followed by pulm   7. 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   Diabetes mellitus without complication ([place])    stated not diabetic but her AIC has been high so she is monitored closely    Dyspnea    Dysrhythmia    Endometrial polyp 04/2000   Fall 01/2018   nose fracture    Heart murmur    Hemorrhoids    Hypertension    Kidney stones    Obesity    Osteopenia 05/2007   Paroxysmal atrial fibrillation ([facility])    Peripheral neuropathy    PMB  (postmenopausal bleeding) 04/2000   SAB (spontaneous abortion) 1981   Sleep apnea    CPAP   Stroke ([place])    SVD (spontaneous vaginal delivery) 04/1964, 05/1978   x3   TIA (transient ischemic attack)      Allergies  Allergen Reactions   Bactrim [Sulfamethoxazole-Trimethoprim] Other (See Comments)    Unknown    Ciprofloxacin Other (See Comments)    GI upset   Hydroxyzine Hcl     Other reaction(s): Other (See Comments) Pt becomes physchotic.   Keflet [Cephalexin] Other (See Comments)    GI upset   Lipitor [Atorvastatin]     Muscle cramps   Lisinopril Cough   Spironolactone Nausea Only   Vistaril [Hydroxyzine Hcl] Other (See Comments)    Pt becomes physchotic.   Erythromycin Rash   Latex Rash   Penicillins Rash    Has patient had a PCN reaction causing immediate rash, facial/tongue/throat swelling, SOB or lightheadedness with hypotension: Yes Has patient had a PCN reaction causing severe rash involving mucus membranes or skin necrosis: No Has patient had a PCN reaction that required hospitalization No Has patient had a PCN reaction occurring within the last 10 years: No  If all of the above answers are "NO", then may proceed with Cephalosporin use.    Tape Itching, Rash and Other (See Comments)    Burning   Tetracycline Rash     Current Outpatient Medications  Medication Sig Dispense Refill   acetaminophen (TYLENOL) 500 MG tablet Take 1,000 mg by mouth every 6 (six) hours as needed for moderate pain or headache.      albuterol (PROAIR HFA) 108 (90 Base) MCG/ACT inhaler INHALE TWO PUFFS BY MOUTH EVERY 6 HOURS AS NEEDED FOR WHEEZING AND SHORTNESS OF BREATH (Patient not taking: Reported on 09/16/2020) 18 g 12   ALPRAZolam (XANAX) 0.25 MG tablet Take 0.25 mg by mouth daily as needed for anxiety.  (Patient not taking: Reported on 09/16/2020)     chlorthalidone (HYGROTON) 25 MG tablet TAKE ONE TABLET BY MOUTH DAILY. 90 tablet 3   cholecalciferol (VITAMIN D) 1000 UNITS tablet Take  1,000 Units by mouth every morning.      ELIQUIS 5 MG TABS tablet TAKE ONE TABLET BY MOUTH TWICE DAILY 60 tablet 6   guaiFENesin (MUCINEX) 600 MG 12 hr tablet Take 600 mg by mouth daily as needed for cough.     levonorgestrel (MIRENA) 20 MCG/DAY IUD 1 each by Intrauterine route once.     losartan (COZAAR) 100 MG tablet Take 100 mg by mouth daily.     metoprolol tartrate (LOPRESSOR) 25 MG tablet TAKE ONE TABLET BY MOUTH TWICE DAILY, MAY TAKE EXTRA TABLET AS NEEDED FOR PALPITATIONS 60 tablet 3   Multiple Vitamins-Minerals (MULTIVITAMIN WITH MINERALS) tablet Take 1 tablet by mouth daily.      Polyethyl Glycol-Propyl Glycol (SYSTANE OP) Place 1 drop into both eyes daily as needed (dry eyes).      potassium chloride SA (KLOR-CON) 20 MEQ tablet TAKE ONE TABLET BY MOUTH DAILY. 90 tablet 3   No current facility-administered medications for this visit.     Past Surgical History:  Procedure Laterality Date   BREAST BIOPSY Right 08/2020   CATARACT EXTRACTION Right 12/07/2018   CERVICAL [place] SURGERY  04/1991   CESAREAN SECTION  08/1981   with BTL   COLONOSCOPY N/A 03/19/2017   Procedure: COLONOSCOPY;  Surgeon: Doran Stabler, MD;  Location: [facility];  Service: Gastroenterology;  Laterality: N/A;   DILATION AND CURETTAGE OF UTERUS     HYSTEROSCOPY  5/02, 01/2011   JOINT REPLACEMENT     LITHOTRIPSY  08/30/2014   with Dr. Jeffie Pollock at [facility] Right 08/2006   right   TOTAL KNEE ARTHROPLASTY Left 06/2008   left    TUBAL LIGATION     WRIST SURGERY Left 06/2005   left     Allergies  Allergen Reactions   Bactrim [Sulfamethoxazole-Trimethoprim] Other (See Comments)    Unknown    Ciprofloxacin Other (See Comments)    GI upset   Hydroxyzine Hcl     Other reaction(s): Other (See Comments) Pt becomes physchotic.   Keflet [Cephalexin] Other (See Comments)    GI upset   Lipitor [Atorvastatin]     Muscle cramps   Lisinopril Cough   Spironolactone Nausea Only    Vistaril [Hydroxyzine Hcl] Other (See Comments)    Pt becomes physchotic.   Erythromycin Rash   Latex Rash   Penicillins Rash    Has patient had a PCN reaction causing immediate rash, facial/tongue/throat swelling, SOB or lightheadedness with hypotension: Yes Has patient had a PCN reaction causing severe rash involving mucus membranes or skin  necrosis: No Has patient had a PCN reaction that required hospitalization No Has patient had a PCN reaction occurring within the last 10 years: No If all of the above answers are "NO", then may proceed with Cephalosporin use.    Tape Itching, Rash and Other (See Comments)    Burning   Tetracycline Rash      Family History  Problem Relation Age of Onset   Heart disease Father    Colon cancer Mother    Heart disease Mother    Breast cancer Maternal Aunt    Colon cancer Maternal Aunt    Colon cancer Maternal Uncle    Ehlers-Danlos syndrome Other        neice-being tested 4/15   Stomach cancer Neg Hx    Pancreatic cancer Neg Hx    Endometrial cancer Neg Hx    Ovarian cancer Neg Hx      Social History Ms. Sosa reports that she has never smoked. She has never used smokeless tobacco. Ms. Gurevich reports no history of alcohol use.   Review of Systems CONSTITUTIONAL: No weight loss, fever, chills, weakness or fatigue.  HEENT: Eyes: No visual loss, blurred vision, double vision or yellow sclerae.No hearing loss, sneezing, congestion, runny nose or sore throat.  SKIN: No rash or itching.  CARDIOVASCULAR: per hpi RESPIRATORY: No shortness of breath, cough or sputum.  GASTROINTESTINAL: No anorexia, nausea, vomiting or diarrhea. No abdominal pain or blood.  GENITOURINARY: No burning on urination, no polyuria NEUROLOGICAL: No headache, dizziness, syncope, paralysis, ataxia, numbness or tingling in the extremities. No change in bowel or bladder control.  MUSCULOSKELETAL: No muscle, back pain, joint pain or stiffness.  LYMPHATICS: No enlarged  nodes. No history of splenectomy.  PSYCHIATRIC: No history of depression or anxiety.  ENDOCRINOLOGIC: No reports of sweating, cold or heat intolerance. No polyuria or polydipsia.  Marland Kitchen   Physical Examination Today's Vitals   10/31/20 1342  BP: (!) 160/90  Pulse: 76  SpO2: 95%  Weight: 261 lb (118.4 kg)  Height: '4\' 11"'$  (1.499 m)   Body mass index is 52.72 kg/m.  Gen: resting comfortably, no acute distress HEENT: no scleral icterus, pupils equal round and reactive, no palptable cervical adenopathy,  CV: irreg, no m/r/g no jvd Resp: Clear to auscultation bilaterally GI: abdomen is soft, non-tender, non-distended, normal bowel sounds, no hepatosplenomegaly MSK: extremities are warm, no edema.  Skin: warm, no rash Neuro:  no focal deficits Psych: appropriate affect   Diagnostic Studies 09/2016 echo Study Conclusions   - Left ventricle: The cavity size was normal. Wall thickness was   increased in a pattern of mild LVH. Systolic function was normal.   The estimated ejection fraction was in the range of 60% to 65%.   Wall motion was normal; there were no regional wall motion   abnormalities. Features are consistent with a pseudonormal left   ventricular filling pattern, with concomitant abnormal relaxation   and increased filling pressure (grade 2 diastolic dysfunction). - Aortic valve: Mildly calcified annulus. Trileaflet. There was   trivial regurgitation. - Mitral valve: Moderately calcified annulus. The findings are   consistent with mild stenosis. There was mild regurgitation. Mean   gradient (D): 3 mm Hg. Valve area by pressure half-time: 1.54   cm^2. - Left atrium: The atrium was moderately dilated. - Right atrium: Central venous pressure (est): 8 mm Hg. - Tricuspid valve: There was trivial regurgitation. - Pulmonary arteries: Systolic pressure could not be accurately   estimated. - Pericardium, extracardiac: There  was no pericardial effusion.   Impressions:   -  Mild LVH with LVEF 60-65% and grade 2 diastolic dysfunction.   Moderate left atrial enlargement. Moderately calcified mitral   annulus with evidence of mild mitral regurgitation and mild   mitral stenosis. Sclerotic aortic valve with trivial aortic   regurgitation. Trivial tricuspid regurgitation.    Assessment and Plan  1. PAF -no symptoms, cotinue current meds - long history of intermittent vaginal bleeding, remains on anticoagulation. We have discussed and she has read up on watchman device, she is not interested in consultation at this time about it.    2. HTN -above goal, we discussed starting norvac but she is not in favor at this time.         Arnoldo Lenis, M.D.

## 2020-10-31 NOTE — Telephone Encounter (Signed)
Spoke with Nicole Cabrera and notified her that she is scheduled for a phone visit in 3 months with Dr. Berline Lopes. Appointment is 11/11 at 3:45pm. Patient is in agreement of date and time.

## 2020-10-31 NOTE — Patient Instructions (Signed)
Medication Instructions:  Continue all current medications.   Labwork: none  Testing/Procedures: none  Follow-Up: 6 months   Any Other Special Instructions Will Be Listed Below (If Applicable).   If you need a refill on your cardiac medications before your next appointment, please call your pharmacy.  

## 2020-10-31 NOTE — Telephone Encounter (Signed)
Called the patient to talk about biopsy. Polypoid inactive endometrium noted, no hyperplasia or malignancy. We discussed option of planning to repeat a biopsy in 6+ months versus outpatient surgery now to remove likely benign polyps that may be causing her vaginal spotting. She does not want to do surgery now. She voices some concerns about her IUD (based on reading that they can perforate the uterus) and is not thinking she is interested in "keeping it for 5 years until I'm 28." We discussed the reason that we are using progesterone therapy. She is amenable to keeping the IUD at this time and we will plan to have a phone visit in 3 years to check in and see how her bleeding has been.  Valarie Cones MD

## 2020-11-04 ENCOUNTER — Ambulatory Visit (INDEPENDENT_AMBULATORY_CARE_PROVIDER_SITE_OTHER): Payer: BC Managed Care – PPO | Admitting: Adult Health

## 2020-11-04 ENCOUNTER — Other Ambulatory Visit: Payer: Self-pay

## 2020-11-04 ENCOUNTER — Encounter: Payer: Self-pay | Admitting: Adult Health

## 2020-11-04 VITALS — BP 156/112 | HR 97 | Ht 59.0 in | Wt 260.0 lb

## 2020-11-04 DIAGNOSIS — Z9989 Dependence on other enabling machines and devices: Secondary | ICD-10-CM

## 2020-11-04 DIAGNOSIS — I480 Paroxysmal atrial fibrillation: Secondary | ICD-10-CM | POA: Diagnosis not present

## 2020-11-04 DIAGNOSIS — G4733 Obstructive sleep apnea (adult) (pediatric): Secondary | ICD-10-CM

## 2020-11-04 DIAGNOSIS — Z8673 Personal history of transient ischemic attack (TIA), and cerebral infarction without residual deficits: Secondary | ICD-10-CM | POA: Diagnosis not present

## 2020-11-04 DIAGNOSIS — I69319 Unspecified symptoms and signs involving cognitive functions following cerebral infarction: Secondary | ICD-10-CM | POA: Diagnosis not present

## 2020-11-04 NOTE — Progress Notes (Signed)
I agree with the above plan 

## 2020-11-04 NOTE — Progress Notes (Signed)
[facility] [address] [place]. [place] [address] [phone]       OFFICE FOLLOW UP NOTE  [name] Date of Birth:  1945-11-09 Medical Record Number:  [id number]   Reason for Referral:  Hospital stroke follow up  CHIEF COMPLAINT:  Chief Complaint  Patient presents with   Follow-up    Rm 2 with spouse Nicole Cabrera  Pt is well and stable, no new CVA symptoms or cew complications.      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 History obtained from patient and chart review. Reviewed all radiology images and labs personally. Nicole Cabrera is a 75 year old female with PMH of HTN, PAF, HLD, OSA on CPAP and morbid obesity who was admitted to [facility] for slurred speech/word finding difficulties, HA, right visual field  cut and right sided weakness. Patient used Tele Neurology at [facility] hospital and was transferred to [facility] for evaluation of stroke. CT negative for hemorrhage. MRI reviewed and showed acute small right paretal infarct and subacute left patchy parietal infarct with petechial hemorrhage. CTA negative for intracranial stenosis. 2D Echo negative for PFO or cardiac source of embolis and showed LVEF 55-60%. LDL 99 and HgbA1c 6.3. Prior to admission patient was on aspirin '81mg'$ .  Patient was diagnosed with atrial fibrillation in June 2018. And was prescribed eliquis but Patient opted to hold off on anticoagulation due to upcoming colonoscopy and did not restart her eliquis after the procedure.  Patient started on Eliquis during hospitalization in October 2018 for stroke.  Patient also discharged on Lipitor 40 mg.  Patient discharged home in stable condition.            05/06/16 visit: Since discharge, patient has been doing well overall.  Patient is accompanied at today's visit by her husband.  Patient continues to take Eliquis  for atrial fibrillation and denies side effects of increased bleeding or bruising.  Patient did have hospitalization on 03/18/17 for rectal bleeding which was found to be related to recent polypectomy.  Patient recovered well and was restarted on Eliquis at discharge.  Patient continues to take Lipitor 40 mg but is complaining of increased muscle and joint pains.  Patient has a history of OSA and does use CPAP nightly.  Patient does complain that she feels her memory has worsened since his stroke but does have a history of memory issues prior to the stroke.  Blood pressure at today's visit is 150/77.  Patient states she does check this at home and typically runs 140/70-80.  Patient denies new or worsening stroke/TIA symptoms.  Visit 11/03/17: Patient is being seen today for scheduled follow-up visit and is accompanied by her son.  She continues to do well from a stroke standpoint.  She continues to take Eliquis without side effects of bleeding or bruising.  She did take decreased dose of Lipitor for 1 month but due to continued myalgias she stopped taking this medication.  Muscle cramping and pain subsided when she stopped taking Lipitor.  She has not had any repeat labs by PCP but states this will be repeated in November at follow-up appointment.  Patient declined to repeat labs at today's visit.  Recommend that if LDL greater than 70 to start statin for HLD management such as Crestor.  Blood pressure today elevated at 167/86 and patient states she does monitor this  at home and typical SBP 1 50-1 60.  Patient states she has been struggling attempting to lower blood pressure at her last appointment with PCP it was agreed upon for patient to try to increase exercise and activity along with weight loss prior to adjusting medications.  Patient states she has been unable to manage weight loss at this time.  She does continue to use CPAP for OSA management.  She does complain of pain in bilateral hands which have been  present for the past couple years but have been worsening.  Recommended PCP follow-up regarding this complaint.  Patient also has short-term memory loss complaints which she states became worse when she had the stroke but has been stable since.  Recommended doing memory exercises but patient declined stating "I just do not feel like doing anything".  Denies new or worsening stroke/TIA symptoms.  Interval History 05/09/18: Nicole Cabrera is being seen today for stroke follow-up.  She continues to do well from a stroke standpoint without residual deficits or recurring of symptoms.  She continues on Eliquis without side effects of bleeding or bruising.  After prior visit, atorvastatin discontinued due to statin myalgias and does states she had lipid panel repeated by PCP with satisfactory levels.  Blood pressure today 156/80 but does monitor at home and typically 140s/80s.  She continues compliance with CPAP for OSA management.  Denies new or worsening stroke/TIA symptoms.  Update 10/19/2019: She returns for follow-up after last visit in February 2020.  She states she had admission in November 2020 for strokes.  MRI scan showed tiny right cerebellar and left basal ganglia patchy infarcts.  MRI of the brain showed no large vessel stenosis.  Echocardiogram showed normal ejection fraction.  Carotid ultrasound was unremarkable.  She was on Eliquis for an had been compliant with it.  Baby aspirin was added however the patient states she has not been taking it.  She remains on Eliquis which is tolerating well without bruising or bleeding.  Blood pressures well controlled today it is 137/73.  The patient did have right hip pain and saw orthopedic doctor who did an MRI which confirmed a right hip hematoma.  She had not fallen or hurt herself this was spontaneous.  She also had vaginal bleeding for several weeks for which she saw her OB/GYN who eventually did D&C and found that she had endometrial hyperplasia and is put her on  Megace 40 mg daily and bleeding has stopped.  She is back on Eliquis and tolerating it well.  The patient and wife both feel that she has had some cognitive impairment and short-term memory difficulties and finding it difficult to figure out complicated staff that she could previously do.  This is not progressive but is annoying.  Is no family history of Alzheimer's.  She denies any headaches, recurrent stroke or TIA symptoms   Update 11/04/2020 JM: Returns for yearly follow-up visit accompanied by her husband. Overall stable from a stroke standpoint.  Denies new stroke/TIA symptoms. Reports continued mild short-term memory loss which has stable without worsening. MMSE 26/30.  Remains on Eliquis - longstanding history of vaginal bleeding routinely followed by OB/GYN.  Concern of benign polyps contributing to bleeding with discussion for removal but pt declines at this time.  She is also due for colonoscopy but is fearful to have this repeated as she had bleeding complications after her prior colonoscopy in 2018.  Crestor initiated 10/2019 for LDL 100 but stopped as unable to tolerate due to myalgias -  symptoms subsided after stopping.  She is resistant to trial any other type of statins or cholesterol lowering agents.  Blood pressure today 156/112 -routinely monitors at home and typically stable.  Endorses nightly use of CPAP followed by Dr. Annamaria Boots.  No further concerns at this time.       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   Diabetes mellitus without complication ([place])    stated not diabetic but her AIC has been high so she is monitored closely    Dyspnea    Dysrhythmia     Endometrial polyp 04/2000   Fall 01/2018   nose fracture    Heart murmur    Hemorrhoids    Hypertension    Kidney stones    Obesity    Osteopenia 05/2007   Paroxysmal atrial fibrillation ([place])    Peripheral neuropathy    PMB (postmenopausal bleeding) 04/2000   SAB (spontaneous abortion) 1981   Sleep apnea    CPAP   Stroke ([place])    SVD (spontaneous vaginal delivery) 04/1964, 05/1978   x3   TIA (transient ischemic attack)     PSH:  Past Surgical History:  Procedure Laterality Date   BREAST BIOPSY Right 08/2020   CATARACT EXTRACTION Right 12/07/2018   CERVICAL [place] SURGERY  04/1991   CESAREAN SECTION  08/1981   with BTL   COLONOSCOPY N/A 03/19/2017   Procedure: COLONOSCOPY;  Surgeon: Doran Stabler, MD;  Location: [facility];  Service: Gastroenterology;  Laterality: N/A;   DILATION AND CURETTAGE OF UTERUS     HYSTEROSCOPY  5/02, 01/2011   JOINT REPLACEMENT     LITHOTRIPSY  08/30/2014   with Dr. 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   Patient is a homemaker.   Education- College   Right handed.   Caffeine- one coke cola           Social Determinants of Health   Financial Resource Strain: Not on file  Food Insecurity: Not on file  Transportation Needs: Not on file  Physical Activity: Not on file  Stress: Not on file  Social Connections: Not on file  Intimate Partner Violence: Not on file    Family History:  Family History  Problem Relation Age of Onset   Heart disease Father    Colon cancer Mother    Heart disease Mother    Breast cancer Maternal Aunt    Colon cancer Maternal Aunt    Colon cancer Maternal Uncle    Ehlers-Danlos syndrome Other        neice-being tested 4/15   Stomach cancer Neg Hx    Pancreatic cancer Neg Hx    Endometrial cancer Neg Hx    Ovarian cancer Neg Hx     Medications:   Current Outpatient Medications on File Prior to Visit  Medication Sig Dispense Refill   acetaminophen (TYLENOL) 500 MG tablet Take 1,000 mg by mouth every 6 (six) hours as needed for moderate pain or headache.      albuterol (PROAIR HFA) 108 (90 Base) MCG/ACT inhaler INHALE TWO PUFFS BY MOUTH EVERY 6 HOURS AS NEEDED FOR WHEEZING AND SHORTNESS OF BREATH 18 g 12   ALPRAZolam (XANAX) 0.25 MG tablet Take 0.25 mg by mouth daily as needed.     chlorthalidone (HYGROTON) 25 MG tablet TAKE ONE TABLET BY MOUTH DAILY. 90 tablet 3   cholecalciferol (VITAMIN D) 1000 UNITS tablet Take 1,000 Units by mouth every morning.      ELIQUIS 5 MG TABS tablet TAKE ONE TABLET BY MOUTH TWICE DAILY 60 tablet 6   guaiFENesin (MUCINEX) 600 MG 12 hr tablet Take 600 mg by mouth daily as needed for cough.     levonorgestrel (MIRENA) 20 MCG/DAY IUD 1 each by Intrauterine route once.     losartan (COZAAR) 100 MG tablet Take 100 mg by mouth daily.     metoprolol tartrate (LOPRESSOR) 25 MG tablet TAKE ONE TABLET BY MOUTH TWICE DAILY, MAY TAKE EXTRA TABLET AS NEEDED FOR PALPITATIONS 60 tablet 3   Multiple Vitamins-Minerals (MULTIVITAMIN WITH MINERALS) tablet Take 1 tablet by mouth daily.      Polyethyl Glycol-Propyl Glycol (SYSTANE OP) Place  1 drop into both eyes daily as needed (dry eyes).      potassium chloride SA (KLOR-CON) 20 MEQ tablet TAKE ONE TABLET BY MOUTH DAILY. 90 tablet 3   No current facility-administered medications on file prior to visit.    Allergies:   Allergies  Allergen Reactions   Bactrim [Sulfamethoxazole-Trimethoprim] Other (See Comments)    Unknown    Ciprofloxacin Other (See Comments)    GI upset   Hydroxyzine Hcl     Other reaction(s): Other (See Comments) Pt becomes physchotic.   Keflet [Cephalexin] Other (See Comments)    GI upset   Lipitor [Atorvastatin]     Muscle cramps   Lisinopril Cough   Spironolactone Nausea Only   Vistaril [Hydroxyzine Hcl] Other (See Comments)    Pt becomes physchotic.   Erythromycin Rash   Latex Rash   Penicillins Rash    Has patient had a PCN reaction causing immediate rash, facial/tongue/throat swelling, SOB or lightheadedness with hypotension: Yes Has patient had a PCN reaction causing severe rash involving mucus membranes or skin necrosis: No Has patient had a PCN reaction that required hospitalization No Has patient had a PCN reaction occurring within the last 10 years: No If all of the above answers are "NO", then may proceed with Cephalosporin use.    Tape Itching, Rash and Other (See Comments)    Burning  Tetracycline Rash    Physical Exam  Vitals:   11/04/20 1315  BP: (!) 156/112  Pulse: 97  Weight: 260 lb (117.9 kg)  Height: '4\' 11"'$  (1.499 m)    Body mass index is 52.51 kg/m. No results found.  General: elderly very pleasant caucasian obese female, obese, seated, in no evident distress Head: head normocephalic and atraumatic.   Neck: supple with no carotid or supraclavicular bruits Cardiovascular: irregular rate and rhythm, no murmurs Musculoskeletal: no deformity Skin:  no rash/petichiae Vascular:  Normal pulses all extremities  Neurologic Exam Mental Status: Awake and fully alert.  Fluent speech and language.  Oriented to place  and time. Remote memory intact. Attention span, concentration and fund of knowledge appropriate. Mood and affect appropriate.  MMSE 26/30 with deficits in attention and concentration, recall and drawing.  Cranial Nerves: Pupils equal, briskly reactive to light. Extraocular movements full without nystagmus. Visual fields full to confrontation. Hearing intact. Facial sensation intact. Face, tongue, palate moves normally and symmetrically.  Motor: Normal bulk and tone. Normal strength in all tested extremity muscles. Sensory: intact to touch , pinprick , position and vibratory sensation.  Coordination: Rapid alternating movements normal in all extremities. Finger-to-nose and heel-to-shin performed accurately bilaterally. Gait and Station: Arises from chair with mild  difficulty. Stance is slightly broad-based gait demonstrates broad-based gait with cane.  Tandem walk and heel toe not attempted Reflexes: 1+ and symmetric. Toes downgoing.    MMSE - Mini Mental State Exam 11/04/2020  Orientation to time 5  Orientation to Place 5  Registration 3  Attention/ Calculation 3  Recall 2  Language- name 2 objects 2  Language- repeat 1  Language- follow 3 step command 3  Language- read & follow direction 1  Write a sentence 1  Copy design 0  Total score 26       ASSESSMENT: 75 y.o. year old female here with right parietal infarct and subacute left parietal infarct on 01/18/2017 secondary to embolis due to atrial fibrillation on aspirin '81mg'$ . Vascular risk factors are HTN, HLD, obesity, sleep apnea and atrial fibrillation.  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    PLAN:  -continue Eliquis for secondary stroke prevention  -Cognition stable -discussed importance of cognitively challenging activities as well as ensuring adequate sleep and place of CPAP, increasing daytime activity  and healthy dietary choices -maintain aggressive risk factor modification with strict control of hypertension with blood pressure goal below 130/90 and lipids with LDL cholesterol goal below 70 mg percent -Lipid panel 10/19/2019 LDL 100 - started on Crestor but intolerant - declines interest in further statin therapy -A1c 10/19/2019 6.2 -Reviewed multiple concerns regarding OB/GYN and intermittent bleeding, atrial fibrillation and use of Eliquis and GI with concern of repeat colonoscopy with history of polyp removal and family history of colon cancer - answered questions to the best of my ability but did advise to schedule f/u with specialty providers for further discussion   Follow-up in 1 year or call earlier if needed    CC:  [facility] provider: Leeanne Rio, MD   I spent 39 minutes of face-to-face and non-face-to-face time with patient and husband.  This included previsit chart review, lab review, study review, electronic health record documentation, patient and husband education and discussion regarding history of prior strokes, secondary stroke prevention measures and aggressive stroke risk factor management, residual deficits with completion and review  of MMSE and other concerns as described above and answered all of the questions to patient and husband satisfaction  Frann Rider, AGNP-BC  [facility] [address] [place], [place] [address]  Phone [phone] Fax [phone] Note: This document was prepared with digital dictation and possible smart phrase technology. Any transcriptional errors that result from this process are unintentional.

## 2020-11-04 NOTE — Patient Instructions (Addendum)
Continue Eliquis (apixaban) daily for secondary stroke prevention  Continue to follow up with PCP regarding cholesterol and blood pressure management  Maintain strict control of hypertension with blood pressure goal below 130/90 and cholesterol with LDL cholesterol (bad cholesterol) goal below 70 mg/dL.   Continue to follow with your OB/GYN as well as schedule follow-up with GI to discuss colonoscopy (you were previously seen by Dr. Alcide Clever)     Followup in the future with me in 1 year or call earlier if needed       Thank you for coming to see [place] at [facility]. I hope we have been able to provide you high quality care today.  You may receive a patient satisfaction survey over the next few weeks. We would appreciate your feedback and comments so that we may continue to improve ourselves and the health of our patients.

## 2020-11-05 ENCOUNTER — Ambulatory Visit: Payer: Medicare Other | Admitting: Adult Health

## 2020-11-11 ENCOUNTER — Other Ambulatory Visit: Payer: Self-pay | Admitting: Cardiology

## 2020-11-13 ENCOUNTER — Ambulatory Visit: Payer: BC Managed Care – PPO | Admitting: Neurology

## 2020-11-18 ENCOUNTER — Encounter (HOSPITAL_COMMUNITY): Payer: Self-pay | Admitting: Emergency Medicine

## 2020-11-18 ENCOUNTER — Other Ambulatory Visit: Payer: Self-pay

## 2020-11-18 ENCOUNTER — Telehealth: Payer: Self-pay

## 2020-11-18 ENCOUNTER — Emergency Department (HOSPITAL_COMMUNITY)
Admission: EM | Admit: 2020-11-18 | Discharge: 2020-11-18 | Disposition: A | Discharge status: Home / Self Care | Payer: BC Managed Care – PPO | Attending: Emergency Medicine | Admitting: Emergency Medicine

## 2020-11-18 DIAGNOSIS — I480 Paroxysmal atrial fibrillation: Secondary | ICD-10-CM

## 2020-11-18 DIAGNOSIS — E119 Type 2 diabetes mellitus without complications: Secondary | ICD-10-CM | POA: Diagnosis not present

## 2020-11-18 DIAGNOSIS — I10 Essential (primary) hypertension: Secondary | ICD-10-CM | POA: Diagnosis not present

## 2020-11-18 DIAGNOSIS — Z96653 Presence of artificial knee joint, bilateral: Secondary | ICD-10-CM | POA: Diagnosis not present

## 2020-11-18 DIAGNOSIS — J45909 Unspecified asthma, uncomplicated: Secondary | ICD-10-CM | POA: Diagnosis not present

## 2020-11-18 DIAGNOSIS — Z8673 Personal history of transient ischemic attack (TIA), and cerebral infarction without residual deficits: Secondary | ICD-10-CM | POA: Insufficient documentation

## 2020-11-18 DIAGNOSIS — R040 Epistaxis: Secondary | ICD-10-CM | POA: Diagnosis not present

## 2020-11-18 DIAGNOSIS — Z79899 Other long term (current) drug therapy: Secondary | ICD-10-CM | POA: Insufficient documentation

## 2020-11-18 DIAGNOSIS — Z7901 Long term (current) use of anticoagulants: Secondary | ICD-10-CM | POA: Insufficient documentation

## 2020-11-18 DIAGNOSIS — Z91040 Latex allergy status: Secondary | ICD-10-CM | POA: Diagnosis not present

## 2020-11-18 MED ORDER — OXYMETAZOLINE HCL 0.05 % NA SOLN
1.0000 | Freq: Once | NASAL | Status: AC
  Administered 2020-11-18: 1 via NASAL
  Filled 2020-11-18: qty 30

## 2020-11-18 NOTE — Telephone Encounter (Signed)
Pt called and stated that since being on Eliquis she has been having frequent nose bleeds, especially in the last 2 weeks. Pt stated that on 8/28 she went to the ER as her nose bled for hours without stopping. ER physician told her she needed to see her ENT to check if her nose needed to be cauterized. Pt stated that last week she stopped taking Eliquis for 2.5 days and the bleeding stopped, but once she started back taking it, the bleeding began again. Pt states she can no longer take this medication. Please advise.

## 2020-11-18 NOTE — ED Notes (Signed)
Pt is not actively bleeding from nose

## 2020-11-18 NOTE — ED Provider Notes (Signed)
[facility] EMERGENCY DEPARTMENT Provider Note   CSN: [id number] Arrival date & time: 11/18/20  [id number]     History Chief Complaint  Patient presents with   Epistaxis    Nicole Cabrera is a 75 y.o. female.   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   Diabetes mellitus without complication [facility])    stated not diabetic but her AIC has been high so she is monitored closely    Dyspnea    Dysrhythmia    Endometrial polyp 04/2000   Fall 01/2018   nose fracture    Heart murmur    Hemorrhoids    Hypertension    Kidney stones    Obesity    Osteopenia 05/2007   Paroxysmal atrial fibrillation ([place])    Peripheral neuropathy    PMB (postmenopausal bleeding) 04/2000   SAB (spontaneous abortion) 1981   Sleep apnea    CPAP   Stroke ([place])    SVD (spontaneous vaginal delivery) 04/1964, 05/1978   x3   TIA (transient ischemic attack)     Patient Active Problem List   Diagnosis Date Noted   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   Acute ischemic stroke ([place]) 02/08/2019   Acute CVA (cerebrovascular accident) ([place]) 02/07/2019   Coagulopathy ([place]) 02/07/2019   Anticoagulated 02/07/2019   Visual changes 02/07/2019   Leukocytosis 02/07/2019   Hyperglycemia 02/07/2019   Diabetes mellitus without complication ([place])    Hypokalemia 03/19/2017   HLD (hyperlipidemia) 03/19/2017   Hematochezia    Acute blood loss anemia    Rectal bleeding  03/18/2017   Acute GI bleeding    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   Stroke (cerebrum) ([place]) 01/18/2017   Intracerebral hemorrhage 01/18/2017   Elevated troponin 01/17/2017   A-fib ([place]) 10/27/2016   Morbid (severe) obesity due to excess calories ([place]) 06/20/2015   Nephrolithiasis 07/09/2014   Asthma with bronchitis 02/23/2014   Aphasia 02/27/2013   PMB (postmenopausal bleeding) 01/23/2011   ACUTE BRONCHITIS 08/30/2009   Essential hypertension 02/22/2007   Obstructive sleep apnea (adult) (pediatric) 02/22/2007   ALLERGY 02/22/2007    Past Surgical History:  Procedure Laterality Date   BREAST BIOPSY Right 08/2020   CATARACT EXTRACTION Right 12/07/2018   CERVICAL [place] SURGERY  04/1991   CESAREAN SECTION  08/1981   with BTL   COLONOSCOPY N/A 03/19/2017   Procedure: COLONOSCOPY;  Surgeon: Doran Stabler, MD;  Location: [place];  Service: Gastroenterology;  Laterality: N/A;   DILATION AND CURETTAGE OF UTERUS     HYSTEROSCOPY  5/02, 01/2011   JOINT REPLACEMENT     LITHOTRIPSY  08/30/2014   with Dr. Jeffie Pollock at [place] Right 08/2006   right   TOTAL KNEE ARTHROPLASTY Left 06/2008   left    TUBAL LIGATION     WRIST SURGERY Left 06/2005   left     OB  History     Gravida  5   Para  3   Term      Preterm      AB  2   Living  3      SAB  1   IAB  1   Ectopic      Multiple      Live Births              Family History  Problem Relation Age of Onset   Heart disease Father    Colon cancer Mother    Heart disease Mother    Breast cancer Maternal Aunt    Colon cancer Maternal Aunt    Colon cancer Maternal Uncle    Ehlers-Danlos syndrome Other        neice-being tested 4/15   Stomach cancer Neg Hx    Pancreatic cancer Neg Hx    Endometrial cancer Neg Hx    Ovarian cancer Neg Hx     Social History   Tobacco Use   Smoking status: Never   Smokeless tobacco: Never  Vaping Use   Vaping Use: Never  used  Substance Use Topics   Alcohol use: No   Drug use: No    Home Medications Prior to Admission medications   Medication Sig Start Date End Date Taking? Authorizing Provider  acetaminophen (TYLENOL) 500 MG tablet Take 1,000 mg by mouth every 6 (six) hours as needed for moderate pain or headache.     [provider]  albuterol (PROAIR HFA) 108 (90 Base) MCG/ACT inhaler INHALE TWO PUFFS BY MOUTH EVERY 6 HOURS AS NEEDED FOR WHEEZING AND SHORTNESS OF BREATH 11/23/19   Young, Kasandra Knudsen, MD  ALPRAZolam Duanne Moron) 0.25 MG tablet Take 0.25 mg by mouth daily as needed. 07/25/19   [provider]  chlorthalidone (HYGROTON) 25 MG tablet TAKE ONE TABLET BY MOUTH DAILY. 11/12/20   Verta Ellen., NP  cholecalciferol (VITAMIN D) 1000 UNITS tablet Take 1,000 Units by mouth every morning.     [provider]  ELIQUIS 5 MG TABS tablet TAKE ONE TABLET BY MOUTH TWICE DAILY 06/07/20   Arnoldo Lenis, MD  guaiFENesin (MUCINEX) 600 MG 12 hr tablet Take 600 mg by mouth daily as needed for cough.    [provider]  levonorgestrel (MIRENA) 20 MCG/DAY IUD 1 each by Intrauterine route once. 11/13/19   [provider]  losartan (COZAAR) 100 MG tablet TAKE ONE TABLET BY MOUTH DAILY. 11/12/20   Verta Ellen., NP  metoprolol tartrate (LOPRESSOR) 25 MG tablet TAKE ONE TABLET BY MOUTH TWICE DAILY, MAY TAKE EXTRA TABLET AS NEEDED FOR PALPITATIONS 10/23/20   Arnoldo Lenis, MD  Multiple Vitamins-Minerals (MULTIVITAMIN WITH MINERALS) tablet Take 1 tablet by mouth daily.     [provider]  Polyethyl Glycol-Propyl Glycol (SYSTANE OP) Place 1 drop into both eyes daily as needed (dry eyes).     [provider]  potassium chloride SA (KLOR-CON) 20 MEQ tablet TAKE ONE TABLET BY MOUTH DAILY. 11/12/20   Verta Ellen., NP    Allergies    Bactrim [sulfamethoxazole-trimethoprim], Ciprofloxacin, Hydroxyzine hcl, Keflet [cephalexin], Lipitor [atorvastatin],  Lisinopril, Spironolactone, Vistaril [hydroxyzine hcl], Erythromycin, Latex, Penicillins, Tape, and Tetracycline  Review of Systems   Review of Systems  HENT:  Positive for nosebleeds. Negative for sore throat.   Respiratory:  Negative for cough.   Neurological:  Negative for headaches.  All other systems reviewed and are negative.  Physical  Exam Updated Vital Signs BP (!) 168/100   Pulse 83   Temp 98 F (36.7 C) (Oral)   Resp 18   Ht '4\' 11"'$  (1.499 m)   Wt 117.9 kg   LMP 01/22/2011   SpO2 96%   BMI 52.51 kg/m   Physical Exam Vitals and nursing note reviewed.  Constitutional:      Appearance: She is well-developed.  HENT:     Head: Normocephalic and atraumatic.     Nose: No congestion or rhinorrhea.     Mouth/Throat:     Mouth: Mucous membranes are moist.     Pharynx: Oropharynx is clear. No oropharyngeal exudate.  Eyes:     Pupils: Pupils are equal, round, and reactive to light.  Cardiovascular:     Rate and Rhythm: Normal rate and regular rhythm.  Pulmonary:     Effort: No respiratory distress.     Breath sounds: No stridor.  Abdominal:     General: Abdomen is flat. There is no distension.  Musculoskeletal:        General: No swelling or tenderness. Normal range of motion.     Cervical back: Normal range of motion.  Skin:    General: Skin is warm and dry.  Neurological:     General: No focal deficit present.     Mental Status: She is alert.    ED Results / Procedures / Treatments   Labs (all labs ordered are listed, but only abnormal results are displayed) Labs Reviewed - No data to display  EKG None  Radiology No results found.  Procedures Procedures   Medications Ordered in ED Medications  oxymetazoline (AFRIN) 0.05 % nasal spray 1 spray (1 spray Each Nare Given 11/18/20 0107)    ED Course  I have reviewed the triage vital signs and the nursing notes.  Pertinent labs & imaging results that were available during my care of the patient were  reviewed by me and considered in my medical decision making (see chart for details).    MDM Rules/Calculators/A&P                           Observed for a couple hours without recurrent bleeding. Discussed afrin usage with pressure. Stable for d/c. Will fu w/ ENT.   Final Clinical Impression(s) / ED Diagnoses Final diagnoses:  Epistaxis    Rx / DC Orders ED Discharge Orders     None        Kenyia W[place]olt, Corene Cornea, MD 11/18/20 [phone]

## 2020-11-18 NOTE — ED Triage Notes (Signed)
Pt c/o nose bleed since about 2200. Pt states she has been having nose bleeds off and on for the past 2 weeks. Pt currently taking Eliquis.

## 2020-11-18 NOTE — Telephone Encounter (Signed)
Needs referal to ENT for epistaxis. Not being on the eliquis increases her risk for stroke by five times. Needs to see if there is something that can be done to resolve the nose bleeds to allow her to get back on the eliquis  Zandra Abts MD

## 2020-11-19 NOTE — Telephone Encounter (Signed)
Can we refer her to Dr Quentin Ore EP to discuss Watchman device for after she sees ENT. The other blood thinners would not really be any different as far as bleeding, the alternatives are xarelto and coumadin which would not have any significantly lower association with bleeding  Carlyle Dolly MD

## 2020-11-19 NOTE — Telephone Encounter (Signed)
Pt stated that she has an appointment with her ENT on 9/19. Pt also would like to discuss watchman device with provider, or a different alternative to Eliquis.

## 2020-11-19 NOTE — Telephone Encounter (Signed)
Pt verbalized understanding of EP referral. Pt had no other questions or concerns at this time.   EP Referral entered for Dr. Quentin Ore to be scheduled after 12/09/2020 d/t pt having ENT appt to discuss nose bleeds.

## 2020-11-27 NOTE — Progress Notes (Signed)
HPI female never smoker followed for OSA, complicated by HBP, history of bronchitis, PAFib, CVA NPSG 07/12/85-AHI 50.7/hour, desaturation to 84%  ---------------------------------------------------------------------------------------    11/23/19- 75 yo female never smoker followed for OSA, complicated by HBP, history of bronchitis, PAFib, CVA 2018 CPAP auto 10-15/ [facility]-  -----Pt states she is still wearing her CPAP at night averaging about 7-8 hours a night. Pt denies any problems with the CPAP. Download not available Body weight today 254 lbs CPAP auto 10-15/ [facility] Had mild CVA 11/ 2000 Covax- 2 Moderna Likes to keep rescue inhaler available, but never uses.   11/28/20- 75 yo female never smoker followed for OSA, complicated by HBP, history of bronchitis, PAFib, CVA 2018,  CPAP auto 10-15/ [facility]-  Download-compliance 100%, AHI 4.1/ hr Body weight today-257 lbs Covid vax-4 Moderna -----Pt states no concerns. Cpap is doing great. Have stopped Eliqiuis due to nosebleeds. "I love my CPAP" Breathing stable with little cough or wheeze- not needing inhaler. Eliquis for AFib. Using nasal mask. Discussed nasal saline gel. She will need to discuss Eliquis with cardiology.  ROS-see HPI + = positive Constitutional:   No-   weight loss, night sweats, fevers, chills, +fatigue, lassitude. HEENT:   No-  headaches, difficulty swallowing, tooth/dental problems, sore throat,       No-  sneezing, itching, ear ache, nasal congestion, post nasal drip,  CV:  No-   chest pain, orthopnea, PND, swelling in lower extremities, anasarca, dizziness, palpitations Resp: +shortness of breath with exertion or at rest.              No-   productive cough,   non-productive cough,  No- coughing up of blood.              No-   change in color of mucus.  No- wheezing.   Skin: No-   rash or lesions. GI:  No-   heartburn, indigestion, abdominal pain, nausea, vomiting,  GU: . MS:  No-   joint  pain or swelling.  Neuro-     nothing unusual Psych:  No- change in mood or affect. No depression or anxiety.  No memory loss.  OBJ   General- Alert, Oriented, Affect-appropriate, Distress- none acute; + morbid obesity, +cane Skin- rash-none, lesions- none, excoriation- none Lymphadenopathy- none Head- atraumatic            Eyes- Gross vision intact, PERRLA, conjunctivae clear secretions            Ears- Hearing, canals-normal            Nose- Clear, no-Septal dev, mucus, polyps, erosion, perforation             Throat- Mallampati III , mucosa clear/ not red , drainage- none, tonsils- atrophic Neck- flexible , trachea midline, no stridor , thyroid nl, carotid no bruit Chest - symmetrical excursion , unlabored           Heart/CV- IRR / Afib, no murmur , no gallop  , no rub, nl s1 s2                           - JVD- none , edema- none, stasis changes- none, varices- none           Lung- clear to P&A, wheeze- none, cough- none , dullness-none, rub- none           Chest wall-  Abd-  Br/ Gen/ Rectal- Not done, not indicated  Extrem- cyanosis- none, clubbing, none, atrophy- none, strength- nl, + cane Neuro- grossly intact to observation

## 2020-11-28 ENCOUNTER — Other Ambulatory Visit: Payer: Self-pay

## 2020-11-28 ENCOUNTER — Ambulatory Visit (INDEPENDENT_AMBULATORY_CARE_PROVIDER_SITE_OTHER): Payer: BC Managed Care – PPO | Admitting: Internal Medicine

## 2020-11-28 ENCOUNTER — Telehealth: Payer: Self-pay

## 2020-11-28 ENCOUNTER — Encounter: Payer: Self-pay | Admitting: Internal Medicine

## 2020-11-28 DIAGNOSIS — J45909 Unspecified asthma, uncomplicated: Secondary | ICD-10-CM

## 2020-11-28 DIAGNOSIS — G4733 Obstructive sleep apnea (adult) (pediatric): Secondary | ICD-10-CM

## 2020-11-28 NOTE — Assessment & Plan Note (Signed)
Mild intermittent uncomplicated. Not using inhaler and doesn't feel need at this time.

## 2020-11-28 NOTE — Assessment & Plan Note (Signed)
Continue efforts at diet/ exercise

## 2020-11-28 NOTE — Telephone Encounter (Signed)
Called to arrange Watchman consult with Dr. Quentin Ore. Left message to call back.

## 2020-11-28 NOTE — Assessment & Plan Note (Signed)
Benefits from CPAP with good compliance and control Plan- continue auto 10-15 

## 2020-11-28 NOTE — Patient Instructions (Signed)
We can continue CPAP auto 10-15  I suggested you look for otc nasal saline gel - you can try a little of this in each nostril at bedtime and whenever you want. See if it reduces your nose bleeds.

## 2020-12-06 NOTE — Telephone Encounter (Signed)
Left message to call back  

## 2020-12-11 NOTE — Telephone Encounter (Signed)
Left message to call back  

## 2020-12-11 NOTE — Telephone Encounter (Signed)
The patient called [facility] and is scheduled 01/15/2021 with Dr. Quentin Ore. Called to see who her ENT is to request records (per previous phone notes she had an ENT visit 9/19). Left message to call back.

## 2020-12-12 ENCOUNTER — Other Ambulatory Visit: Payer: Self-pay | Admitting: Cardiology

## 2020-12-12 NOTE — Telephone Encounter (Signed)
Prescription refill request for Eliquis received. Indication: PAF Last office visit: 10/31/20 Zandra Abts MD Scr: 0.68 on 08/29/19 Age: 75 Weight: 118.4kg  Based on above findings Eliquis 5mg  twice daily is the appropriate dose.  Refill approved.  Pt is due for lab work. Pt has appt 01/16/21 with Dr Quentin Ore.  Requested CBC/BMP at that time.

## 2020-12-12 NOTE — Telephone Encounter (Signed)
The patient has an appointment with Dr. Melida Quitter (ENT) 10/25.  She is scheduled with Dr. Quentin Ore for [facility] consult 01/16/2021. Will request Dr. Redmond Baseman' records for Dr. Mardene Speak appointment for review.

## 2020-12-18 ENCOUNTER — Encounter: Payer: Self-pay | Admitting: Internal Medicine

## 2021-01-14 DIAGNOSIS — Z87898 Personal history of other specified conditions: Secondary | ICD-10-CM | POA: Diagnosis not present

## 2021-01-15 ENCOUNTER — Institutional Professional Consult (permissible substitution): Payer: BC Managed Care – PPO | Admitting: Cardiology

## 2021-01-16 ENCOUNTER — Ambulatory Visit (INDEPENDENT_AMBULATORY_CARE_PROVIDER_SITE_OTHER): Payer: BC Managed Care – PPO | Admitting: Cardiology

## 2021-01-16 ENCOUNTER — Encounter: Payer: Self-pay | Admitting: Cardiology

## 2021-01-16 ENCOUNTER — Other Ambulatory Visit: Payer: Self-pay

## 2021-01-16 VITALS — BP 130/80 | HR 88 | Ht 59.5 in | Wt 263.4 lb

## 2021-01-16 DIAGNOSIS — R040 Epistaxis: Secondary | ICD-10-CM

## 2021-01-16 DIAGNOSIS — I10 Essential (primary) hypertension: Secondary | ICD-10-CM

## 2021-01-16 DIAGNOSIS — K922 Gastrointestinal hemorrhage, unspecified: Secondary | ICD-10-CM

## 2021-01-16 DIAGNOSIS — I4819 Other persistent atrial fibrillation: Secondary | ICD-10-CM | POA: Diagnosis not present

## 2021-01-16 DIAGNOSIS — N939 Abnormal uterine and vaginal bleeding, unspecified: Secondary | ICD-10-CM

## 2021-01-16 NOTE — Patient Instructions (Addendum)
Medication Instructions:  Your physician recommends that you continue on your current medications as directed. Please refer to the Current Medication list given to you today. *If you need a refill on your cardiac medications before your next appointment, please call your pharmacy*  Lab Work: None ordered. If you have labs (blood work) drawn today and your tests are completely normal, you will receive your results only by: [place] (if you have MyChart) OR A paper copy in the mail If you have any lab test that is abnormal or we need to change your treatment, we will call you to review the results.  Testing/Procedures: None ordered.  Follow-Up: At [facility], you and your health needs are our priority.  As part of our continuing mission to provide you with exceptional heart care, we have created designated Provider Care Teams.  These Care Teams include your primary Cardiologist (physician) and Advanced Practice Providers (APPs -  Physician Assistants and Nurse Practitioners) who all work together to provide you with the care you need, when you need it.  Your next appointment:   Your physician wants you to follow-up in: as needed  Let me know if you would like to be considered for Watchman implant.  Sonia Baller RN

## 2021-01-16 NOTE — Progress Notes (Signed)
Electrophysiology Office Note:    Date:  01/16/2021   ID:  Nicole, Cabrera Aug 13, 1945, MRN [id number]  PCP:  Leeanne Rio, MD  [facility] Cardiologist:  Carlyle Dolly, MD  [facility] Electrophysiologist:  None   Referring MD: Arnoldo Lenis, MD   Chief Complaint: Atrial fibrillation  History of Present Illness:    Nicole Cabrera is a 75 y.o. female who presents for an evaluation of atrial fibrillation at the request of Dr. Harl Bowie. Their medical history includes diabetes, hypertension, obesity, stroke, sleep apnea on CPAP.  The patient last saw Carlyle Dolly on October 31, 2020.  Her atrial fibrillation was first diagnosed in 2018 when she presented to the emergency department.  Has taken Eliquis for stroke prophylaxis but this has been complicated by vaginal bleeding.  She also has a history of a probable cardioembolic stroke in October 2018.  The patient is with her husband today in clinic.  The patient confirms a significant bleeding history.  She has had severe nosebleeds requiring multiple emergency department trips and cauterization and packing.  This seems to be her primary concern with restarting blood thinners.  She is also had GI bleeding after colonoscopy requiring clips to be placed.  She is also required evaluation and treatment for vaginal bleeding.  She is currently not taking an anticoagulant.   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   Diabetes mellitus without complication ([place])    stated not diabetic but her AIC has been high so she is monitored closely    Dyspnea    Dysrhythmia    Endometrial polyp 04/2000   Fall 01/2018   nose fracture    Heart murmur    Hemorrhoids    Hypertension    Kidney stones    Obesity     Osteopenia 05/2007   Paroxysmal atrial fibrillation ([place])    Peripheral neuropathy    PMB (postmenopausal bleeding) 04/2000   SAB (spontaneous abortion) 1981   Sleep apnea    CPAP   Stroke ([place])    SVD (spontaneous vaginal delivery) 04/1964, 05/1978   x3   TIA (transient ischemic attack)     Past Surgical History:  Procedure Laterality Date   BREAST BIOPSY Right 08/2020   CATARACT EXTRACTION Right 12/07/2018   CERVICAL [place] SURGERY  04/1991   CESAREAN SECTION  08/1981   with BTL   COLONOSCOPY N/A 03/19/2017   Procedure: COLONOSCOPY;  Surgeon: Doran Stabler, MD;  Location: [place];  Service: Gastroenterology;  Laterality: N/A;   DILATION AND CURETTAGE OF UTERUS     HYSTEROSCOPY  5/02, 01/2011   JOINT REPLACEMENT     LITHOTRIPSY  08/30/2014   with Dr. Jeffie Pollock at [place] Right 08/2006   right   TOTAL KNEE ARTHROPLASTY Left 06/2008   left    TUBAL LIGATION     WRIST SURGERY Left 06/2005   left    Current Medications: Current Meds  Medication Sig   acetaminophen (TYLENOL) 500 MG tablet Take 1,000 mg by mouth every 6 (six) hours as needed for moderate pain or headache.    albuterol (PROAIR HFA) 108 (90 Base) MCG/ACT inhaler INHALE  TWO PUFFS BY MOUTH EVERY 6 HOURS AS NEEDED FOR WHEEZING AND SHORTNESS OF BREATH   ALPRAZolam (XANAX) 0.25 MG tablet Take 0.25 mg by mouth daily as needed.   chlorthalidone (HYGROTON) 25 MG tablet TAKE ONE TABLET BY MOUTH DAILY.   cholecalciferol (VITAMIN D) 1000 UNITS tablet Take 1,000 Units by mouth every morning.    guaiFENesin (MUCINEX) 600 MG 12 hr tablet Take 600 mg by mouth daily as needed for cough.   levonorgestrel (MIRENA) 20 MCG/DAY IUD 1 each by Intrauterine route once.   losartan (COZAAR) 100 MG tablet TAKE ONE TABLET BY MOUTH DAILY.   metoprolol tartrate (LOPRESSOR) 25 MG tablet TAKE ONE TABLET BY MOUTH TWICE DAILY, MAY TAKE EXTRA TABLET AS NEEDED FOR PALPITATIONS   Multiple Vitamins-Minerals  (MULTIVITAMIN WITH MINERALS) tablet Take 1 tablet by mouth daily.    Polyethyl Glycol-Propyl Glycol (SYSTANE OP) Place 1 drop into both eyes daily as needed (dry eyes).    potassium chloride SA (KLOR-CON) 20 MEQ tablet TAKE ONE TABLET BY MOUTH DAILY.     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   Patient is a homemaker.   Education- College   Right handed.   Caffeine- one coke cola          Social Determinants of Health   Financial Resource Strain: Not on file  Food Insecurity: Not on file  Transportation Needs: Not on file  Physical Activity: Not on file  Stress: Not on file  Social Connections: Not on file     Family History: The patient's family history includes Breast cancer in her maternal aunt; Colon cancer in her maternal aunt, maternal uncle, and mother; Ehlers-Danlos syndrome in an other family member; Heart disease in her father and mother. There is no history of Stomach cancer, Pancreatic cancer, Endometrial cancer, or Ovarian cancer.  ROS:   Please see the history of present illness.    All  other systems reviewed and are negative.  EKGs/Labs/Other Studies Reviewed:    The following studies were reviewed today: Prior notes  February 08, 2019 echo Left ventricular function normal, 60% Right ventricular function normal Severely dilated left atrium Mildly dilated right atrium No significant valvular abnormalities  EKG:  The ekg ordered today demonstrates atrial fibrillation, low amplitude QRS, PVCs   Recent Labs: 02/28/2020: Hemoglobin 14.3; Platelets 288  Recent Lipid Panel    Component Value Date/Time   CHOL 165 10/19/2019 1530   TRIG 153 (H) 10/19/2019 1530   HDL 38 (L) 10/19/2019 1530   CHOLHDL 4.3 10/19/2019 1530   CHOLHDL 3.8 02/07/2019 1056  VLDL 24 02/07/2019 1056   LDLCALC 100 (H) 10/19/2019 1530    Physical Exam:    VS:  BP 130/80   Pulse 88   Ht 4' 11.5" (1.511 m)   Wt 263 lb 6.4 oz (119.5 kg)   LMP 01/22/2011   SpO2 95%   BMI 52.31 kg/m     Wt Readings from Last 3 Encounters:  01/16/21 263 lb 6.4 oz (119.5 kg)  11/28/20 257 lb 6.4 oz (116.8 kg)  11/18/20 260 lb (117.9 kg)     GEN:  Well nourished, well developed in no acute distress.  Morbidly obese HEENT: Normal NECK: No JVD; No carotid bruits LYMPHATICS: No lymphadenopathy CARDIAC: Irregularly irregular, no murmurs, rubs, gallops RESPIRATORY:  Clear to auscultation without rales, wheezing or rhonchi  ABDOMEN: Soft, non-tender, non-distended MUSCULOSKELETAL:  No edema; No deformity  SKIN: Warm and dry NEUROLOGIC:  Alert and oriented x 3 PSYCHIATRIC:  Normal affect       ASSESSMENT:    1. Persistent atrial fibrillation ([place])   2. Essential hypertension   3. Epistaxis requiring cauterization   4. Gastrointestinal hemorrhage, unspecified gastrointestinal hemorrhage type   5. Vaginal bleeding    PLAN:    In order of problems listed above:  1. Persistent atrial fibrillation [facility]) Patient has persistent atrial fibrillation which is relatively asymptomatic.  She is not  currently anticoagulated because of an extensive history of bleeding.  She is at an elevated risk for stroke.  I do think watchman would be reasonable strategy for stroke risk reduction in an effort to avoid long-term exposure to anticoagulation.  We discussed the need for short-term anticoagulation during today's appointment.  Given her extensive bleeding history, would favor half dose Eliquis in the post implant period.  She would need to start it at least 2 weeks prior to the implant.  I discussed the watchman procedure in detail with the patient and her husband during today's appointment.  I discussed the risk, recovery and need for short-term anticoagulation.  They would like some time to think about it and will let us know how they would like to proceed.  ----------  I have seen Nicole Cabrera in the office today who is being considered for a Watchman left atrial appendage closure device. I believe they will benefit from this procedure given their history of atrial fibrillation, CHA2DS2-VASc score of 6 and unadjusted ischemic stroke rate of 9.7% per year. Unfortunately, the patient is not felt to be a long term anticoagulation candidate secondary to history of GI, nose, colon and vaginal bleeding. The patient's chart has been reviewed and I feel that they would be a candidate for short term oral anticoagulation after Watchman implant.   It is my belief that after undergoing a LAA closure procedure, Nicole Cabrera will not need long term anticoagulation which eliminates anticoagulation side effects and major bleeding risk.   Procedural risks for the Watchman implant have been reviewed with the patient including a 0.5% risk of stroke, <1% risk of perforation and <1% risk of device embolization.    The published clinical data on the safety and effectiveness of WATCHMAN include but are not limited to the following: - Holmes DR, Mechele Claude, Sick P et al. for the PROTECT AF Investigators. Percutaneous  closure of the left atrial appendage versus warfarin therapy for prevention of stroke in patients with atrial fibrillation: a randomised non-inferiority trial. Lancet 2009; 374: 534-42. Mechele Claude, Doshi SK, Abelardo Diesel D et al. on behalf  of the PROTECT AF Investigators. Percutaneous Left Atrial Appendage Closure for Stroke Prophylaxis in Patients With Atrial Fibrillation 2.3-Year Follow-up of the PROTECT AF (Watchman Left Atrial Appendage System for Embolic Protection in Patients With Atrial Fibrillation) Trial. Circulation 2013; 127:720-729. - Alli O, Doshi S,  Kar S, Reddy VY, Sievert H et al. Quality of Life Assessment in the Randomized PROTECT AF (Percutaneous Closure of the Left Atrial Appendage Versus Warfarin Therapy for Prevention of Stroke in Patients With Atrial Fibrillation) Trial of Patients at Risk for Stroke With Nonvalvular Atrial Fibrillation. J Am Coll Cardiol 2013; 67:5449-2. Vertell Limber DR, Tarri Abernethy, Price M, [place], Sievert H, Doshi S, Huber K, Reddy V. Prospective randomized evaluation of the Watchman left atrial appendage Device in patients with atrial fibrillation versus long-term warfarin therapy; the PREVAIL trial. Journal of the [employer] of Cardiology, Vol. 4, No. 1, 2014, 1-11. - Kar S, Doshi SK, Sadhu A, Horton R, Osorio J et al. Primary outcome evaluation of a next-generation left atrial appendage closure device: results from the PINNACLE FLX trial. Circulation 2021;143(18)1754-1762.    After today's visit with the patient which was dedicated solely for shared decision making visit regarding LAA closure device, the patient decided to think about the procedure more and let us know how they would like to proceed.  HAS-BLED score 4 Hypertension Yes  Abnormal renal and liver function (Dialysis, transplant, Cr >2.26 mg/dL /Cirrhosis or Bilirubin >2x Normal or AST/ALT/AP >3x Normal) No  Stroke Yes  Bleeding Yes  Labile INR (Unstable/high INR) No  Elderly (>65) Yes   Drugs or alcohol (? 8 drinks/week, anti-plt or NSAID) No    2. Essential hypertension Controlled.  Continue current regimen.  3. Epistaxis requiring cauterization See #1  4. Gastrointestinal hemorrhage, unspecified gastrointestinal hemorrhage type See #1  5. Vaginal bleeding See #1    Follow-up TBD.    Total time spent with patient today 65 minutes. This includes reviewing records, evaluating the patient and coordinating care.  Medication Adjustments/Labs and Tests Ordered: Current medicines are reviewed at length with the patient today.  Concerns regarding medicines are outlined above.  Orders Placed This Encounter  Procedures   EKG 12-Lead   No orders of the defined types were placed in this encounter.    Signed, Hilton Cork. Quentin Ore, MD, [facility], [facility] 01/16/2021 1:32 PM    Electrophysiology [facility]

## 2021-01-21 ENCOUNTER — Telehealth: Payer: Self-pay | Admitting: *Deleted

## 2021-01-21 NOTE — Telephone Encounter (Signed)
Per Dr Berline Lopes rescheduled the patient's appt from 11/11 to 11/7

## 2021-01-27 ENCOUNTER — Encounter: Payer: Self-pay | Admitting: Gynecologic Oncology

## 2021-01-27 ENCOUNTER — Inpatient Hospital Stay: Payer: BC Managed Care – PPO | Attending: Gynecologic Oncology | Admitting: Gynecologic Oncology

## 2021-01-27 ENCOUNTER — Telehealth: Payer: Self-pay | Admitting: Gynecologic Oncology

## 2021-01-27 DIAGNOSIS — N8501 Benign endometrial hyperplasia: Secondary | ICD-10-CM | POA: Diagnosis not present

## 2021-01-27 DIAGNOSIS — N950 Postmenopausal bleeding: Secondary | ICD-10-CM

## 2021-01-27 DIAGNOSIS — Z79890 Hormone replacement therapy (postmenopausal): Secondary | ICD-10-CM

## 2021-01-27 NOTE — Telephone Encounter (Signed)
Called patient at the time of her phone visit. No answer, left voicemail requesting callback.  Jeral Pinch MD Gynecologic Oncology

## 2021-01-27 NOTE — Progress Notes (Signed)
Gynecologic Oncology Telehealth Consult Note: Gyn-Onc  I connected with Nicole Cabrera on 01/27/21 at  4:15 PM EST by telephone and verified that I am speaking with the correct person using two identifiers.  I discussed the limitations, risks, security and privacy concerns of performing an evaluation and management service by telemedicine and the availability of in-person appointments. I also discussed with the patient that there may be a patient responsible charge related to this service. The patient expressed understanding and agreed to proceed.  Other persons participating in the visit and their role in the encounter: none.  Patient's location: home Provider's location: [facility]  Reason for Visit: follow-up in the setting of endometrial hyperplasia without atypia  Treatment History: 08/30/19: EMB in the OR given scant tissue obtained at office visit and difficulty with exam due to body habitus. 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 Patient was prescribed Megace. Took this for 9 days but given side effects, discontinued. 11/13/19: Mirena IUD placed in clinic 12/05/19: Pelvic ultrasound confirmed expected location of IUD in endometrium of upper uterine segment 02/27/20: seen in the office with Dr. [name]. still having some spotting.  Had heavy bleeding in august, Eliquis was temporarily stopped. EMB declined. 08/29/20: see in office with Dr. [name]. Continued spotting, no heavy bleeding. Declined EMB again.  10/25/20: EMB showed polypoid fragments of inactive endometrium with hormone effect, no hyperplasia and malignancy.   Interval History: Patient reports overall doing well. She has had significant decrease in bleeding which she decreases now as spotting intermittent (on average once a week). She was recently taken off her blood thinner due to epistaxis. Nose bleeds have stopped and there has been some discussion about whether to restart blood thinner. She describes  occasional pelvic pain/cramping.   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   Diabetes mellitus without complication ([place])    stated not diabetic but her AIC has been high so she is monitored closely    Dyspnea    Dysrhythmia    Endometrial polyp 04/2000   Fall 01/2018   nose fracture    Heart murmur    Hemorrhoids    Hypertension    Kidney stones    Obesity    Osteopenia 05/2007   Paroxysmal atrial fibrillation ([place])    Peripheral neuropathy    PMB (postmenopausal bleeding) 04/2000   SAB (spontaneous abortion) 1981   Sleep apnea    CPAP   Stroke ([place])    SVD (spontaneous vaginal delivery) 04/1964, 05/1978   x3   TIA (transient ischemic attack)     Past Surgical History:  Procedure Laterality Date   BREAST BIOPSY Right 08/2020   CATARACT EXTRACTION Right 12/07/2018   CERVICAL [place] SURGERY  04/1991   CESAREAN SECTION  08/1981   with BTL   COLONOSCOPY N/A 03/19/2017   Procedure: COLONOSCOPY;  Surgeon: Doran Stabler, MD;  Location: [place];  Service: Gastroenterology;  Laterality: N/A;   DILATION AND CURETTAGE OF UTERUS     HYSTEROSCOPY  5/02, 01/2011   JOINT REPLACEMENT     LITHOTRIPSY  08/30/2014   with Dr. Jeffie Pollock at [place] Right 08/2006  right   TOTAL KNEE ARTHROPLASTY Left 06/2008   left    TUBAL LIGATION     WRIST SURGERY Left 06/2005   left    Family History  Problem Relation Age of Onset   Heart disease Father    Colon cancer Mother    Heart disease Mother    Breast cancer Maternal Aunt    Colon cancer Maternal Aunt    Colon cancer Maternal Uncle    Ehlers-Danlos syndrome Other        neice-being tested 4/15   Stomach cancer Neg Hx    Pancreatic cancer Neg Hx     Endometrial cancer Neg Hx    Ovarian cancer Neg Hx     Social History   Socioeconomic History   Marital status: Married    Spouse name: [name]   Number of children: 3   Years of education: college   Highest education level: Not on file  Occupational History   Occupation: Scientist, 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   Patient is a homemaker.   Education- College   Right handed.   Caffeine- one coke cola          Social Determinants of Health   Financial Resource Strain: Not on file  Food Insecurity: Not on file  Transportation Needs: Not on file  Physical Activity: Not on file  Stress: Not on file  Social Connections: Not on file    Current Medications:  Current Outpatient Medications:    acetaminophen (TYLENOL) 500 MG tablet, Take 1,000 mg by mouth every 6 (six) hours as needed for moderate pain or headache. , Disp: , Rfl:    albuterol (PROAIR HFA) 108 (90 Base) MCG/ACT inhaler, INHALE TWO PUFFS BY MOUTH EVERY 6 HOURS AS NEEDED FOR WHEEZING AND SHORTNESS OF BREATH, Disp: 18 g, Rfl: 12   ALPRAZolam (XANAX) 0.25 MG tablet, Take 0.25 mg by mouth daily as needed., Disp: , Rfl:    chlorthalidone (HYGROTON) 25 MG tablet, TAKE ONE TABLET BY MOUTH DAILY., Disp: 90 tablet, Rfl: 3   cholecalciferol (VITAMIN D) 1000 UNITS tablet, Take 1,000 Units by mouth every morning. , Disp: , Rfl:    guaiFENesin (MUCINEX) 600 MG 12 hr tablet, Take 600 mg by mouth daily as needed for cough., Disp: , Rfl:    levonorgestrel (MIRENA) 20 MCG/DAY IUD, 1 each by Intrauterine route once.,  Disp: , Rfl:    losartan (COZAAR) 100 MG tablet, TAKE ONE TABLET BY MOUTH DAILY., Disp: 90 tablet, Rfl: 3   metoprolol tartrate (LOPRESSOR) 25 MG tablet, TAKE ONE TABLET BY MOUTH TWICE DAILY, MAY TAKE EXTRA TABLET AS NEEDED FOR PALPITATIONS, Disp: 60 tablet, Rfl: 3   Multiple Vitamins-Minerals (MULTIVITAMIN WITH MINERALS) tablet, Take 1 tablet by mouth daily. , Disp: , Rfl:    Polyethyl Glycol-Propyl Glycol (SYSTANE OP), Place 1 drop into both eyes daily as needed (dry eyes). , Disp: , Rfl:    potassium chloride SA (KLOR-CON) 20 MEQ tablet, TAKE ONE TABLET  BY MOUTH DAILY., Disp: 90 tablet, Rfl: 3  Review of Symptoms: Pertinent positives as per HPI.  Physical Exam: LMP 01/22/2011  Deferred given limitations of phone visit  Laboratory & Radiologic Studies: None new  Assessment & Plan: Nicole Cabrera is a 75 y.o. woman with simple and complex endometrial hyperplasia now being treated with progestin (IUD).  Patient has had improvement and almost resolution of vaginal bleeding. Reviewed negative biopsy from August. Had originally discussed f/u biopsy in 6 months. The patient is not interested in having another biopsy now (either in clinic or the OR). Reviewed the risk associated with no biopsies moving forward although overall decrease in bleeding is reassuring. Patient was amenable to follow-up phone visit in 4-5 months and will call if something changes before next visit.  I discussed the assessment and treatment plan with the patient. The patient was provided with an opportunity to ask questions and all were answered. The patient agreed with the plan and demonstrated an understanding of the instructions.   The patient was advised to call back or see an in-person evaluation if the symptoms worsen or if the condition fails to improve as anticipated.   18 minutes of total time was spent for this patient encounter, including preparation, face-to-face counseling with the patient and coordination  of care, and documentation of the encounter.   Jeral Pinch, MD  Division of Gynecologic Oncology  Department of Obstetrics and Gynecology  [facility]

## 2021-01-31 ENCOUNTER — Ambulatory Visit: Payer: BC Managed Care – PPO | Admitting: Gynecologic Oncology

## 2021-02-07 ENCOUNTER — Other Ambulatory Visit: Payer: Self-pay | Admitting: General Surgery

## 2021-02-07 ENCOUNTER — Telehealth: Payer: Self-pay

## 2021-02-07 DIAGNOSIS — R921 Mammographic calcification found on diagnostic imaging of breast: Secondary | ICD-10-CM

## 2021-02-07 NOTE — Telephone Encounter (Signed)
Cover My Meds- Prior Authorization for Eliquis  Case [id number] Status:Approved Review Type:Prior Auth Coverage Start Date:01/08/2021 Coverage End Date:02/07/2022

## 2021-02-12 DIAGNOSIS — G4733 Obstructive sleep apnea (adult) (pediatric): Secondary | ICD-10-CM | POA: Diagnosis not present

## 2021-02-17 ENCOUNTER — Other Ambulatory Visit: Payer: Self-pay | Admitting: Cardiology

## 2021-03-10 DIAGNOSIS — R7302 Impaired glucose tolerance (oral): Secondary | ICD-10-CM | POA: Diagnosis not present

## 2021-03-10 DIAGNOSIS — Z13220 Encounter for screening for lipoid disorders: Secondary | ICD-10-CM | POA: Diagnosis not present

## 2021-03-10 DIAGNOSIS — I480 Paroxysmal atrial fibrillation: Secondary | ICD-10-CM | POA: Diagnosis not present

## 2021-03-10 DIAGNOSIS — Z0000 Encounter for general adult medical examination without abnormal findings: Secondary | ICD-10-CM | POA: Diagnosis not present

## 2021-03-10 DIAGNOSIS — Z136 Encounter for screening for cardiovascular disorders: Secondary | ICD-10-CM | POA: Diagnosis not present

## 2021-03-25 ENCOUNTER — Ambulatory Visit
Admission: RE | Admit: 2021-03-25 | Discharge: 2021-03-25 | Disposition: A | Discharge status: Home / Self Care | Payer: BC Managed Care – PPO | Source: Ambulatory Visit | Attending: General Surgery | Admitting: General Surgery

## 2021-03-25 ENCOUNTER — Other Ambulatory Visit: Payer: Self-pay | Admitting: General Surgery

## 2021-03-25 DIAGNOSIS — R921 Mammographic calcification found on diagnostic imaging of breast: Secondary | ICD-10-CM

## 2021-05-15 ENCOUNTER — Encounter: Payer: Self-pay | Admitting: Cardiology

## 2021-05-15 ENCOUNTER — Ambulatory Visit (INDEPENDENT_AMBULATORY_CARE_PROVIDER_SITE_OTHER): Payer: BC Managed Care – PPO | Admitting: Cardiology

## 2021-05-15 VITALS — BP 124/70 | HR 72 | Ht 59.0 in | Wt 257.4 lb

## 2021-05-15 DIAGNOSIS — D6869 Other thrombophilia: Secondary | ICD-10-CM

## 2021-05-15 DIAGNOSIS — R0602 Shortness of breath: Secondary | ICD-10-CM | POA: Diagnosis not present

## 2021-05-15 DIAGNOSIS — I4891 Unspecified atrial fibrillation: Secondary | ICD-10-CM

## 2021-05-15 DIAGNOSIS — E782 Mixed hyperlipidemia: Secondary | ICD-10-CM | POA: Diagnosis not present

## 2021-05-15 DIAGNOSIS — I10 Essential (primary) hypertension: Secondary | ICD-10-CM | POA: Diagnosis not present

## 2021-05-15 DIAGNOSIS — R600 Localized edema: Secondary | ICD-10-CM

## 2021-05-15 NOTE — Progress Notes (Signed)
Clinical Summary Nicole Cabrera is a 76 y.o.female seen today for follow up of the following medical problems.    1. Afib - new diagnosis during  ER visit 09/14/16  - recurrent vaginal bleeding has been an ongoing issue, followed by ob/gyn - also prior issues with nosebleeds. They have resolved,    - no recent bleeding, had some vaginal bleeding about 2 weeks ago - no recent palpitations.  - remains on eliquis  - nose bleeds 10/2020, was to see ENT - seen by Dr Quentin Ore to discuss watchman, was a candidate but patient wanted to give some thought - has not wanted to restart eliquis at this time    2. Hyperlipidemia - neurologist lowered statin, now off - muscle cramps/body aches on atorvastatin - she has not been willing to try alternative statin   - 01/2018 TC 186 TG 112 HDL 45 LDL 119   09/2019 TC 165 TG 153 LDL 100 - has been restistant to retyring statin  Jan 2023 TC 168 TG 150 HDL 35 LDL 106 - resistant to statins, not intrested in zetia.    3. OSA - uses CPAP machine, followed by Dr Annamaria Boots.        4. HTN -home bp 130s/80s     4. 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 She had refused anticoag prior to that.  - now on eliquis.    5. CAD - 12/2016 coronary CTA: coronary calcium score of 7, mild diffuse nonobstructive disease - elevated trop 12/2016 during admit with cardioembolic stroke, likely embolized to coronaries as well     6. Pulmonary nodules - followed by pulm   7. 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   Diabetes mellitus without complication ([facility])    stated not diabetic but her AIC has been high so she is monitored closely    Dyspnea     Dysrhythmia    Endometrial polyp 04/2000   Fall 01/2018   nose fracture    Heart murmur    Hemorrhoids    Hypertension    Kidney stones    Obesity    Osteopenia 05/2007   Paroxysmal atrial fibrillation (HCC)    Peripheral neuropathy    PMB (postmenopausal bleeding) 04/2000   SAB (spontaneous abortion) 1981   Sleep apnea    CPAP   Stroke ([facility])    SVD (spontaneous vaginal delivery) 04/1964, 05/1978   x3   TIA (transient ischemic attack)      Allergies  Allergen Reactions   Bactrim [Sulfamethoxazole-Trimethoprim] Other (See Comments)    Unknown    Ciprofloxacin Other (See Comments)    GI upset   Hydroxyzine Hcl     Other reaction(s): Other (See Comments) Pt becomes physchotic.   Keflet [Cephalexin] Other (See Comments)    GI upset   Lipitor [Atorvastatin]     Muscle cramps   Lisinopril Cough   Spironolactone Nausea Only   Vistaril [Hydroxyzine Hcl] Other (See Comments)    Pt becomes physchotic.   Erythromycin Rash   Latex Rash   Penicillins Rash    Has patient had a  PCN reaction causing immediate rash, facial/tongue/throat swelling, SOB or lightheadedness with hypotension: Yes Has patient had a PCN reaction causing severe rash involving mucus membranes or skin necrosis: No Has patient had a PCN reaction that required hospitalization No Has patient had a PCN reaction occurring within the last 10 years: No If all of the above answers are "NO", then may proceed with Cephalosporin use.    Tape Itching, Rash and Other (See Comments)    Burning   Tetracycline Rash     Current Outpatient Medications  Medication Sig Dispense Refill   acetaminophen (TYLENOL) 500 MG tablet Take 1,000 mg by mouth every 6 (six) hours as needed for moderate pain or headache.      albuterol (PROAIR HFA) 108 (90 Base) MCG/ACT inhaler INHALE TWO PUFFS BY MOUTH EVERY 6 HOURS AS NEEDED FOR WHEEZING AND SHORTNESS OF BREATH 18 g 12   ALPRAZolam (XANAX) 0.25 MG tablet Take 0.25 mg by mouth daily as  needed.     chlorthalidone (HYGROTON) 25 MG tablet TAKE ONE TABLET BY MOUTH DAILY. 90 tablet 3   cholecalciferol (VITAMIN D) 1000 UNITS tablet Take 1,000 Units by mouth every morning.      guaiFENesin (MUCINEX) 600 MG 12 hr tablet Take 600 mg by mouth daily as needed for cough.     levonorgestrel (MIRENA) 20 MCG/DAY IUD 1 each by Intrauterine route once.     losartan (COZAAR) 100 MG tablet TAKE ONE TABLET BY MOUTH DAILY. 90 tablet 3   metoprolol tartrate (LOPRESSOR) 25 MG tablet TAKE ONE TABLET BY MOUTH TWICE DAILY, MAY TAKE ONE EXTRA TABLET AS NEEDED FOR PALPITATIONS 60 tablet 3   Multiple Vitamins-Minerals (MULTIVITAMIN WITH MINERALS) tablet Take 1 tablet by mouth daily.      Polyethyl Glycol-Propyl Glycol (SYSTANE OP) Place 1 drop into both eyes daily as needed (dry eyes).      potassium chloride SA (KLOR-CON) 20 MEQ tablet TAKE ONE TABLET BY MOUTH DAILY. 90 tablet 3   No current facility-administered medications for this visit.     Past Surgical History:  Procedure Laterality Date   BREAST BIOPSY Right 08/2020   CATARACT EXTRACTION Right 12/07/2018   CERVICAL [place] SURGERY  04/1991   CESAREAN SECTION  08/1981   with BTL   COLONOSCOPY N/A 03/19/2017   Procedure: COLONOSCOPY;  Surgeon: Doran Stabler, MD;  Location: [facility];  Service: Gastroenterology;  Laterality: N/A;   DILATION AND CURETTAGE OF UTERUS     HYSTEROSCOPY  5/02, 01/2011   JOINT REPLACEMENT     LITHOTRIPSY  08/30/2014   with Dr. Jeffie Pollock at [facility] Right 08/2006   right   TOTAL KNEE ARTHROPLASTY Left 06/2008   left    TUBAL LIGATION     WRIST SURGERY Left 06/2005   left     Allergies  Allergen Reactions   Bactrim [Sulfamethoxazole-Trimethoprim] Other (See Comments)    Unknown    Ciprofloxacin Other (See Comments)    GI upset   Hydroxyzine Hcl     Other reaction(s): Other (See Comments) Pt becomes physchotic.   Keflet [Cephalexin] Other (See Comments)    GI upset    Lipitor [Atorvastatin]     Muscle cramps   Lisinopril Cough   Spironolactone Nausea Only   Vistaril [Hydroxyzine Hcl] Other (See Comments)    Pt becomes physchotic.   Erythromycin Rash   Latex Rash   Penicillins Rash    Has patient had a PCN reaction causing immediate rash,  facial/tongue/throat swelling, SOB or lightheadedness with hypotension: Yes Has patient had a PCN reaction causing severe rash involving mucus membranes or skin necrosis: No Has patient had a PCN reaction that required hospitalization No Has patient had a PCN reaction occurring within the last 10 years: No If all of the above answers are "NO", then may proceed with Cephalosporin use.    Tape Itching, Rash and Other (See Comments)    Burning   Tetracycline Rash      Family History  Problem Relation Age of Onset   Heart disease Father    Colon cancer Mother    Heart disease Mother    Breast cancer Maternal Aunt    Colon cancer Maternal Aunt    Colon cancer Maternal Uncle    Ehlers-Danlos syndrome Other        neice-being tested 4/15   Stomach cancer Neg Hx    Pancreatic cancer Neg Hx    Endometrial cancer Neg Hx    Ovarian cancer Neg Hx      Social History Ms. Mckillop reports that she has never smoked. She has never used smokeless tobacco. Ms. Whittingham reports no history of alcohol use.   Review of Systems CONSTITUTIONAL: No weight loss, fever, chills, weakness or fatigue.  HEENT: Eyes: No visual loss, blurred vision, double vision or yellow sclerae.No hearing loss, sneezing, congestion, runny nose or sore throat.  SKIN: No rash or itching.  CARDIOVASCULAR: per hpi RESPIRATORY: No shortness of breath, cough or sputum.  GASTROINTESTINAL: No anorexia, nausea, vomiting or diarrhea. No abdominal pain or blood.  GENITOURINARY: No burning on urination, no polyuria NEUROLOGICAL: No headache, dizziness, syncope, paralysis, ataxia, numbness or tingling in the extremities. No change in bowel or bladder control.   MUSCULOSKELETAL: No muscle, back pain, joint pain or stiffness.  LYMPHATICS: No enlarged nodes. No history of splenectomy.  PSYCHIATRIC: No history of depression or anxiety.  ENDOCRINOLOGIC: No reports of sweating, cold or heat intolerance. No polyuria or polydipsia.  Marland Kitchen   Physical Examination Today's Vitals   05/15/21 1327  BP: (!) 160/90  Pulse: 72  SpO2: 98%  Weight: 257 lb 6.4 oz (116.8 kg)  Height: 4\' 11"  (1.499 m)   Body mass index is 51.99 kg/m.  Gen: resting comfortably, no acute distress HEENT: no scleral icterus, pupils equal round and reactive, no palptable cervical adenopathy,  CV: irreg, 2/6 systolic murmur  Resp: Clear to auscultation bilaterally GI: abdomen is soft, non-tender, non-distended, normal bowel sounds, no hepatosplenomegaly MSK: extremities are warm, 1+ bilateral LE edema Skin: warm, no rash Neuro:  no focal deficits Psych: appropriate affect   Diagnostic Studies 09/2016 echo Study Conclusions   - Left ventricle: The cavity size was normal. Wall thickness was   increased in a pattern of mild LVH. Systolic function was normal.   The estimated ejection fraction was in the range of 60% to 65%.   Wall motion was normal; there were no regional wall motion   abnormalities. Features are consistent with a pseudonormal left   ventricular filling pattern, with concomitant abnormal relaxation   and increased filling pressure (grade 2 diastolic dysfunction). - Aortic valve: Mildly calcified annulus. Trileaflet. There was   trivial regurgitation. - Mitral valve: Moderately calcified annulus. The findings are   consistent with mild stenosis. There was mild regurgitation. Mean   gradient (D): 3 mm Hg. Valve area by pressure half-time: 1.54   cm^2. - Left atrium: The atrium was moderately dilated. - Right atrium: Central venous pressure (est): 8  mm Hg. - Tricuspid valve: There was trivial regurgitation. - Pulmonary arteries: Systolic pressure could not be  accurately   estimated. - Pericardium, extracardiac: There was no pericardial effusion.   Impressions:   - Mild LVH with LVEF 60-65% and grade 2 diastolic dysfunction.   Moderate left atrial enlargement. Moderately calcified mitral   annulus with evidence of mild mitral regurgitation and mild   mitral stenosis. Sclerotic aortic valve with trivial aortic   regurgitation. Trivial tricuspid regurgitation.    Assessment and Plan   1. PAF -no symptoms - recurrent bleeding on eliquis, she has not wanted to retry - candidate for Watchman device per Dr Quentin Ore however patient not ready to commit at this time - continue current meds   2. HTN - bp at goal on manaul recheck, continue current meds  3. Hyperlipidemia - above goal - side effects on atorvastatin - has been reluctant to try alternative statin or zetia, continue to discuss.   4. LE edema/SOB - some progression, repeat echo - on chlorthaldione, if significnat increase could add prn lasix.    Arnoldo Lenis, M.D.

## 2021-05-15 NOTE — Patient Instructions (Signed)

## 2021-06-03 ENCOUNTER — Other Ambulatory Visit: Payer: Self-pay

## 2021-06-03 ENCOUNTER — Emergency Department (HOSPITAL_COMMUNITY): Payer: BC Managed Care – PPO

## 2021-06-03 ENCOUNTER — Encounter (HOSPITAL_COMMUNITY): Payer: Self-pay

## 2021-06-03 ENCOUNTER — Telehealth: Payer: Self-pay | Admitting: Cardiology

## 2021-06-03 ENCOUNTER — Emergency Department (HOSPITAL_COMMUNITY)
Admission: EM | Admit: 2021-06-03 | Discharge: 2021-06-03 | Disposition: A | Discharge status: Home / Self Care | Payer: BC Managed Care – PPO | Attending: Emergency Medicine | Admitting: Emergency Medicine

## 2021-06-03 DIAGNOSIS — R030 Elevated blood-pressure reading, without diagnosis of hypertension: Secondary | ICD-10-CM | POA: Diagnosis not present

## 2021-06-03 DIAGNOSIS — Z91040 Latex allergy status: Secondary | ICD-10-CM | POA: Insufficient documentation

## 2021-06-03 DIAGNOSIS — R519 Headache, unspecified: Secondary | ICD-10-CM | POA: Diagnosis not present

## 2021-06-03 DIAGNOSIS — K922 Gastrointestinal hemorrhage, unspecified: Secondary | ICD-10-CM

## 2021-06-03 DIAGNOSIS — I4891 Unspecified atrial fibrillation: Secondary | ICD-10-CM

## 2021-06-03 NOTE — ED Provider Notes (Signed)
?[facility] ?Provider Note ? ? ?CSN: [id number] ?Arrival date & time: 06/03/21  2013 ? ?  ? ?History ? ?Chief Complaint  ?Patient presents with  ? Headache  ? ? ?Nicole Cabrera is a 76 y.o. female. ? ?HPI ?She presents for evaluation of headache which started earlier today, gradually.  Headache persisted and she went to an urgent care around 7 PM tonight and was directed here for further evaluation.  She has history of CVA, TIA and atrial fibrillation.  She is currently off anticoagulation because of bleeding complications.  She did not have symptoms associated with a headache today such as confusion, difficulty speaking, weakness, numbness, or change in behavior.  She is here with her husband.  She states that since leaving the urgent care her headache is resolved completely.  She does not have a history of migraines. ?  ? ?Home Medications ?Prior to Admission medications   ?Medication Sig Start Date End Date Taking? Authorizing Provider  ?acetaminophen (TYLENOL) 500 MG tablet Take 1,000 mg by mouth every 6 (six) hours as needed for moderate pain or headache.     [provider]  ?albuterol (PROAIR HFA) 108 (90 Base) MCG/ACT inhaler INHALE TWO PUFFS BY MOUTH EVERY 6 HOURS AS NEEDED FOR WHEEZING AND SHORTNESS OF BREATH 11/23/19   Baird Lyons D, MD  ?ALPRAZolam Duanne Moron) 0.25 MG tablet Take 0.25 mg by mouth daily as needed. 07/25/19   [provider]  ?chlorthalidone (HYGROTON) 25 MG tablet TAKE ONE TABLET BY MOUTH DAILY. 11/12/20   Verta Ellen., NP  ?cholecalciferol (VITAMIN D) 1000 UNITS tablet Take 1,000 Units by mouth every morning.     [provider]  ?guaiFENesin (MUCINEX) 600 MG 12 hr tablet Take 600 mg by mouth daily as needed for cough.    [provider]  ?levonorgestrel (MIRENA) 20 MCG/DAY IUD 1 each by Intrauterine route once. 11/13/19   [provider]  ?losartan (COZAAR) 100 MG tablet TAKE ONE TABLET BY MOUTH DAILY. 11/12/20   Verta Ellen., NP  ?metoprolol tartrate (LOPRESSOR) 25 MG tablet TAKE ONE TABLET BY MOUTH TWICE DAILY, MAY TAKE ONE EXTRA TABLET AS NEEDED FOR PALPITATIONS 02/17/21   Arnoldo Lenis, MD  ?Multiple Vitamins-Minerals (MULTIVITAMIN WITH MINERALS) tablet Take 1 tablet by mouth daily.     [provider]  ?Polyethyl Glycol-Propyl Glycol (SYSTANE OP) Place 1 drop into both eyes daily as needed (dry eyes).     [provider]  ?potassium chloride SA (KLOR-CON) 20 MEQ tablet TAKE ONE TABLET BY MOUTH DAILY. 11/12/20   Verta Ellen., NP  ?   ? ?Allergies    ?Bactrim [sulfamethoxazole-trimethoprim], Ciprofloxacin, Hydroxyzine hcl, Keflet [cephalexin], Lipitor [atorvastatin], Lisinopril, Spironolactone, Vistaril [hydroxyzine hcl], Erythromycin, Latex, Penicillins, Tape, and Tetracycline   ? ?Review of Systems   ?Review of Systems ? ?Physical Exam ?Updated Vital Signs ?BP (!) 161/103 (BP Location: Right Arm)   Pulse 82   Temp 98.1 ?F (36.7 ?C) (Oral)   Resp 20   Ht '4\' 11"'$  (1.499 m)   Wt 116.8 kg   LMP 01/22/2011   SpO2 96%   BMI 51.99 kg/m?  ?Physical Exam ?Vitals and nursing note reviewed.  ?Constitutional:   ?   General: She is not in acute distress. ?   Appearance: She is well-developed. She is not ill-appearing or diaphoretic.  ?HENT:  ?   Head: Normocephalic and atraumatic.  ?   Right Ear: External ear normal.  ?  Left Ear: External ear normal.  ?Eyes:  ?   Conjunctiva/sclera: Conjunctivae normal.  ?   Pupils: Pupils are equal, round, and reactive to light.  ?Neck:  ?   Trachea: Phonation normal.  ?Cardiovascular:  ?   Rate and Rhythm: Normal rate.  ?Pulmonary:  ?   Effort: Pulmonary effort is normal.  ?Abdominal:  ?   General: There is no distension.  ?Musculoskeletal:     ?   General: Normal range of motion.  ?   Cervical back: Normal range of motion and neck supple.  ?Skin: ?   General: Skin is warm and dry.  ?Neurological:  ?   Mental Status: She is alert and oriented to person, place,  and time.  ?   Cranial Nerves: No cranial nerve deficit.  ?   Sensory: No sensory deficit.  ?   Motor: No abnormal muscle tone.  ?   Coordination: Coordination normal.  ?   Comments: No dysarthria, aphasia or nystagmus.  No pronator drift.  No ataxia.  ?Psychiatric:     ?   Mood and Affect: Mood normal.     ?   Behavior: Behavior normal.     ?   Thought Content: Thought content normal.     ?   Judgment: Judgment normal.  ? ? ?ED Results / Procedures / Treatments   ?Labs ?(all labs ordered are listed, but only abnormal results are displayed) ?Labs Reviewed - No data to display ? ?EKG ?None ? ?Radiology ?No results found. ? ?Procedures ?Procedures  ? ? ?Medications Ordered in ED ?Medications - No data to display ? ?ED Course/ Medical Decision Making/ A&P ?  ?                        ?Medical Decision Making ?Patient presenting for evaluation of headache which occurred spontaneously earlier today, then resolved prior to arrival here.  She was concerned about a stroke, went to an urgent care and then was directed here for further evaluation. ? ?Problems Addressed: ?Nonintractable headache, unspecified chronicity pattern, unspecified headache type: acute illness or injury ?   Details: Started earlier today, gradually then resolved ? ?Amount and/or Complexity of Data Reviewed ?Radiology: ordered. ?   Details: Cogent historian, husband at bedside offers some comments as well. ? ?Risk ?Prescription drug management. ?Decision regarding hospitalization. ?Risk Details: Patient presenting with atraumatic headache, and no other neurologic symptoms, took aspirin today, but went to an urgent care.  Her symptoms resolved prior to arrival to the ED.  She has a baseline normal neurologic exam.  There are no indicators for acute CVA.  Her blood pressure is marginally elevated and not indicative of a syndrome of hypertensive crisis.  Doubt CVA, cervical neuropathy or occult fractures.  Symptomatic care indicated and advised.  She does  not require hospitalization. ? ? ? ? ? ? ? ? ? ? ?Final Clinical Impression(s) / ED Diagnoses ?Final diagnoses:  ?None  ? ? ?Rx / DC Orders ?ED Discharge Orders   ? ? None  ? ?  ? ? ?  ?Daleen Bo, MD ?06/06/21 1031 ? ?

## 2021-06-03 NOTE — Telephone Encounter (Signed)
?  Pt is calling to schedule her watchman procedure  ?

## 2021-06-03 NOTE — Discharge Instructions (Signed)
There is no sign of serious problem and your headache is resolved.  Continue taking your usual medicines and follow-up with your doctor as needed for problems. ?

## 2021-06-03 NOTE — ED Triage Notes (Signed)
Pt reports headache that started this morning, pt says she has had it all day and taken two doses of tylenol and 2 baby ASA today. Pt says headache resolved on the way over here from urgent care in [place]- says they sent her here for further evaluation with hx of CVA. ?

## 2021-06-04 NOTE — Telephone Encounter (Signed)
The patient had her formal Watchman consult with Dr. Quentin Ore 01/16/2021 and declined to continue LAAO work-up/scheduling at that time.  ?She now wishes to proceed with scheduling. ?She understands she will need pre-LAAO imaging. Her last GFR was >60 but this was in 2021. Will have her repeat BMET in [place] and tentatively arrange cCT on a Friday (when her husband is off work and can drive her).  ?Will schedule her for visit with Kathyrn Drown after cCT to discuss results and arrange Laie. ? ?The patient understands Dr. Quentin Ore will be consulted to [facility] plan and she will be called back to confirm all appointments once scheduled. ?She was grateful for call and agrees with plan.  ?

## 2021-06-05 NOTE — Telephone Encounter (Signed)
The patient states her husband has several procedures in April and would like to push Watchman work-up out until May. ? ?Scheduled her for pre-CT visit with Nell Range 5/5. ?Scheduled her for Watchman CT 5/12. She understands she will get lab work and Materials engineer for [facility] [place] at visit with Joellen Jersey. ? ?She would like to be penciled in for 08/14/21 LAAO date. ? ?The patient was grateful for call and agrees with plan. ?

## 2021-06-12 ENCOUNTER — Other Ambulatory Visit: Payer: Self-pay | Admitting: *Deleted

## 2021-06-12 MED ORDER — METOPROLOL TARTRATE 25 MG PO TABS: ORAL_TABLET | ORAL | 3 refills | Status: AC

## 2021-06-13 ENCOUNTER — Ambulatory Visit (HOSPITAL_COMMUNITY): Payer: BC Managed Care – PPO

## 2021-06-17 ENCOUNTER — Ambulatory Visit (INDEPENDENT_AMBULATORY_CARE_PROVIDER_SITE_OTHER): Payer: BC Managed Care – PPO

## 2021-06-17 DIAGNOSIS — R0602 Shortness of breath: Secondary | ICD-10-CM | POA: Diagnosis not present

## 2021-06-18 LAB — ECHOCARDIOGRAM COMPLETE
AR max vel: 2.28 cm2
AV Area VTI: 2.25 cm2
AV Area mean vel: 2.36 cm2
AV Mean grad: 6 mmHg
AV Peak grad: 9.4 mmHg
AV Vena cont: 0.23 cm
Ao pk vel: 1.53 m/s
Area-P 1/2: 3.5 cm2
MV VTI: 1.69 cm2
P 1/2 time: 891 msec
S' Lateral: 2.44 cm
Single Plane A4C EF: 66.8 %

## 2021-06-23 ENCOUNTER — Other Ambulatory Visit: Payer: Self-pay | Admitting: General Surgery

## 2021-06-23 ENCOUNTER — Ambulatory Visit
Admission: RE | Admit: 2021-06-23 | Discharge: 2021-06-23 | Disposition: A | Discharge status: Home / Self Care | Payer: BC Managed Care – PPO | Source: Ambulatory Visit | Attending: General Surgery | Admitting: General Surgery

## 2021-06-23 ENCOUNTER — Telehealth: Payer: Self-pay | Admitting: Cardiology

## 2021-06-23 DIAGNOSIS — R921 Mammographic calcification found on diagnostic imaging of breast: Secondary | ICD-10-CM

## 2021-06-23 DIAGNOSIS — R928 Other abnormal and inconclusive findings on diagnostic imaging of breast: Secondary | ICD-10-CM

## 2021-06-23 NOTE — Telephone Encounter (Signed)
-----   Message from Arnoldo Lenis, MD sent at 06/23/2021 10:11 AM EDT ----- ?Echo shows heart function remains strong, overall no worrisome new findings on echo ? ?Zandra Abts MD ?

## 2021-06-23 NOTE — Telephone Encounter (Signed)
Patient notified and verbalized understanding. Pt had no questions or concerns at this time. PCP copied.  ?

## 2021-06-23 NOTE — Telephone Encounter (Signed)
Follow Up:     Patient is returning a call, concerning her Echo results. 

## 2021-06-24 ENCOUNTER — Other Ambulatory Visit: Payer: Self-pay | Admitting: General Surgery

## 2021-06-24 DIAGNOSIS — N6320 Unspecified lump in the left breast, unspecified quadrant: Secondary | ICD-10-CM

## 2021-06-24 DIAGNOSIS — R599 Enlarged lymph nodes, unspecified: Secondary | ICD-10-CM

## 2021-07-04 ENCOUNTER — Other Ambulatory Visit: Payer: BC Managed Care – PPO

## 2021-07-07 ENCOUNTER — Ambulatory Visit
Admission: RE | Admit: 2021-07-07 | Discharge: 2021-07-07 | Disposition: A | Discharge status: Home / Self Care | Payer: BC Managed Care – PPO | Source: Ambulatory Visit | Attending: General Surgery | Admitting: General Surgery

## 2021-07-07 DIAGNOSIS — R599 Enlarged lymph nodes, unspecified: Secondary | ICD-10-CM

## 2021-07-07 DIAGNOSIS — N6320 Unspecified lump in the left breast, unspecified quadrant: Secondary | ICD-10-CM

## 2021-07-11 ENCOUNTER — Ambulatory Visit
Admission: RE | Admit: 2021-07-11 | Discharge: 2021-07-11 | Disposition: A | Discharge status: Home / Self Care | Payer: BC Managed Care – PPO | Source: Ambulatory Visit | Attending: General Surgery | Admitting: General Surgery

## 2021-07-11 ENCOUNTER — Other Ambulatory Visit: Payer: Self-pay | Admitting: General Surgery

## 2021-07-11 ENCOUNTER — Ambulatory Visit: Admission: RE | Admit: 2021-07-11 | Payer: BC Managed Care – PPO | Source: Ambulatory Visit

## 2021-07-11 DIAGNOSIS — N6320 Unspecified lump in the left breast, unspecified quadrant: Secondary | ICD-10-CM

## 2021-07-18 ENCOUNTER — Telehealth: Payer: Self-pay | Admitting: Hematology and Oncology

## 2021-07-18 ENCOUNTER — Other Ambulatory Visit: Payer: Self-pay | Admitting: General Surgery

## 2021-07-18 DIAGNOSIS — C50912 Malignant neoplasm of unspecified site of left female breast: Secondary | ICD-10-CM

## 2021-07-18 NOTE — Telephone Encounter (Signed)
Scheduled appointment per providers request. Left message with apt details.  ?

## 2021-07-18 NOTE — Progress Notes (Signed)
[facility] ?CONSULT NOTE ? ?Patient Care Team: ?Leeanne Rio, MD as PCP - General (Family Medicine) ?Arnoldo Lenis, MD as PCP - Cardiology (Cardiology) ?Patient, No Pcp Per (Inactive) (General Practice) ? ?CHIEF COMPLAINTS/PURPOSE OF CONSULTATION:  ?Newly diagnosed breast cancer ? ?HISTORY OF PRESENTING ILLNESS:  ?Zhuri W Gillentine 76 y.o. female is here because of recent diagnosis of left IBC ? ?I reviewed her records extensively and collaborated the history with the patient. ? ?SUMMARY OF ONCOLOGIC HISTORY: ?Oncology History  ?Inflammatory breast cancer, left ([place])  ?06/23/2021 Mammogram  ? Suspicious mass in the left breast at 2 o'clock measuring 1.8 cm. ?There is associated diffuse left breast skin thickening and ?increased trabeculation concerning for inflammatory breast cancer. ?There are at least 5 abnormal left axillary lymph nodes. 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 ?  ?07/11/2021 Pathology Results  ? Left needle core biopsy from 2 o'clock position showed invasive mammary carcinoma, grade 3, lymph node biopsy showed metastatic carcinoma associated with minimal residual lymphoid tissue.  Prognostic markers could not be reported due to problems with tissue processing.  She will be undergoing repeat biopsy. ?  ?07/22/2021 Initial Diagnosis  ? Inflammatory breast cancer, left ([place]) ?  ? 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 ?She is here with her husband for an initial visit.  She denies any complaints except for sudden onset swelling of her left breast as well as some palpable lumps in the left axilla.  She has underlying peripheral neuropathy,  idiopathic, grade 2, cannot feel the feet under her ground at times.  She is also not very mobile because of her arthritis as well as her body mass index. ?Rest of the pertinent 10 point ROS reviewed and negative. ? ?3 children 57,43 and 39. She has 3 grandchildren ? ?MEDICAL HISTORY:  ?Past Medical History:  ?Diagnosis Date  ? Allergy   ? Anxiety   ? xanax  ? Arthritis   ? shoulders/hips - tx with OTC meds  ? Breast cyst 03/1990  ? pt denies 12/05/13  ? Bronchitis   ? Cataract   ? Cervical tab 1972  ? 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 Diabetes mellitus without complication ([place])   ? stated not diabetic but her AIC has been high so she is monitored closely   ? Dyspnea   ? Dysrhythmia   ? Endometrial polyp 04/2000  ? Fall 01/2018  ? nose fracture   ? Heart murmur   ? Hemorrhoids   ? Hypertension   ? Kidney stones   ? Obesity   ? Osteopenia 05/2007  ? Paroxysmal atrial fibrillation ([facility])   ? Peripheral neuropathy   ? PMB (postmenopausal bleeding) 04/2000  ? SAB (spontaneous abortion) 1981  ? Sleep apnea   ? CPAP  ? Stroke [facility])   ? SVD (spontaneous vaginal delivery) 04/1964, 05/1978  ? x3  ? TIA (transient ischemic attack)   ? ? ?SURGICAL HISTORY: ?Past Surgical History:  ?Procedure Laterality Date  ? BREAST BIOPSY Right 06/2020  ? x2  ? CATARACT EXTRACTION Right 12/07/2018  ? CERVICAL DISC SURGERY  04/1991  ? CESAREAN SECTION  08/1981  ? with BTL  ? COLONOSCOPY N/A 03/19/2017  ? Procedure: COLONOSCOPY;  Surgeon: [name],  MD;  Location: [place];  Service: Gastroenterology;  Laterality: N/A;  ? DILATION AND CURETTAGE OF UTERUS    ? HYSTEROSCOPY  5/02, 01/2011  ? JOINT REPLACEMENT    ? LITHOTRIPSY  08/30/2014  ? with Dr. Jeffie Pollock at [facility]  ? TOTAL KNEE ARTHROPLASTY Right 08/2006  ? right  ? TOTAL KNEE ARTHROPLASTY Left 06/2008  ? left   ? TUBAL LIGATION    ? WRIST SURGERY Left 06/2005  ? left  ? ? ?SOCIAL HISTORY: ?Social History  ? ?Socioeconomic History  ? Marital status: Married  ?   Spouse name: [name]  ? Number of children: 3  ? Years of education: college  ? Highest education level: Not on file  ?Occupational History  ? Occupation: homemaker  ?  Employer: UNEMPLOYED  ?  Comment: Home maker  ?Tobacco Use  ? Smoking status: Never  ? Smokeless tobacco: Never  ?Vaping Use  ? Vaping Use: Never used  ?Substance and Sexual Activity  ? Alcohol use: No  ? Drug use: No  ? Sexual activity: Not Currently  ?  Partners: Male  ?  Birth control/protection: Surgical  ?  Comment: BTL  ?Other Topics Concern  ? Not on file  ?Social History Narrative  ? Patient is married [name])  ? Patient has three children.  ? Patient is a homemaker.  ? Education- College  ? Right handed.  ? Caffeine- one coke cola   ?   ?   ? ?Social Determinants of Health  ? ?Financial Resource Strain: Not on file  ?Food Insecurity: Not on file  ?Transportation Needs: Not on file  ?Physical Activity: Not on file  ?Stress: Not on file  ?Social Connections: Not on file  ?Intimate Partner Violence: Not on file  ? ? ?FAMILY HISTORY: ?Family History  ?Problem Relation Age of Onset  ? Heart disease Father   ? Colon cancer Mother   ? Heart disease Mother   ? Breast cancer Maternal Aunt   ? Colon cancer Maternal Aunt   ? Colon cancer Maternal Uncle   ? Ehlers-Danlos syndrome Other   ?     neice-being tested 4/15  ? Stomach cancer Neg Hx   ? Pancreatic cancer Neg Hx   ? Endometrial cancer Neg Hx   ? Ovarian cancer Neg Hx   ? ? ?ALLERGIES:  is allergic to bactrim [sulfamethoxazole-trimethoprim], ciprofloxacin, hydroxyzine hcl, keflet [cephalexin], lipitor [atorvastatin], lisinopril, spironolactone, vistaril [hydroxyzine hcl], erythromycin, latex, penicillins, tape, and tetracycline. ? ?MEDICATIONS:  ?Current Outpatient Medications  ?Medication Sig Dispense Refill  ? acetaminophen (TYLENOL) 500 MG tablet Take 1,000 mg by mouth every 6 (six) hours as needed for moderate pain or headache.     ? albuterol (PROAIR HFA) 108 (90 Base) MCG/ACT inhaler  INHALE TWO PUFFS BY MOUTH EVERY 6 HOURS AS NEEDED FOR WHEEZING AND SHORTNESS OF BREATH 18 g 12  ? ALPRAZolam (XANAX) 0.25 MG tablet Take 0.25 mg by mouth daily as needed.    ? chlorthalidone (HYGROTON) 25 MG tablet TAKE ONE TABLET BY MOUTH DAILY. 90 tablet 3  ? cholecalciferol (VITAMIN D) 1000 UNITS tablet Take 1,000 Units by mouth every morning.     ? guaiFENesin (MUCINEX) 600 MG 12 hr tablet Take 600 mg by mouth daily as needed for cough.    ? levonorgestrel (MIRENA) 20 MCG/DAY IUD 1 each by Intrauterine route once.    ? losartan (COZAAR) 100 MG tablet TAKE ONE TABLET BY MOUTH DAILY. 90 tablet 3  ? metoprolol tartrate (  LOPRESSOR) 25 MG tablet TAKE ONE TABLET BY MOUTH TWICE DAILY, MAY TAKE ONE EXTRA TABLET AS NEEDED FOR PALPITATIONS 75 tablet 3  ? Multiple Vitamins-Minerals (MULTIVITAMIN WITH MINERALS) tablet Take 1 tablet by mouth daily.     ? Polyethyl Glycol-Propyl Glycol (SYSTANE OP) Place 1 drop into both eyes daily as needed (dry eyes).     ? potassium chloride SA (KLOR-CON) 20 MEQ tablet TAKE ONE TABLET BY MOUTH DAILY. 90 tablet 3  ? ?No current facility-administered medications for this visit.  ? ? ?REVIEW OF SYSTEMS:   ?Constitutional: Denies fevers, chills or abnormal night sweats ?Eyes: Denies blurriness of vision, double vision or watery eyes ?Ears, nose, mouth, throat, and face: Denies mucositis or sore throat ?Respiratory: Denies cough, dyspnea or wheezes ?Cardiovascular: Denies palpitation, chest discomfort or lower extremity swelling ?Gastrointestinal:  Denies nausea, heartburn or change in bowel habits ?Skin: Denies abnormal skin rashes ?Lymphatics: Denies new lymphadenopathy or easy bruising ?Neurological:Denies numbness, tingling or new weaknesses ?Behavioral/Psych: Mood is stable, no new changes  ?Breast: Sudden onset redness and swelling of the left breast ?All other systems were reviewed with the patient and are negative. ? ?PHYSICAL EXAMINATION: ?ECOG PERFORMANCE STATUS: 0 -  Asymptomatic ? ?Vitals:  ? 07/21/21 1135  ?BP: (!) 154/98  ?Pulse: 65  ?Resp: 16  ?Temp: 97.9 ?F (36.6 ?C)  ?SpO2: 96%  ? ?Filed Weights  ? 07/21/21 1135  ?Weight: 258 lb 4.8 oz (117.2 kg)  ? ? ?GENERAL:alert, no distress an

## 2021-07-21 ENCOUNTER — Telehealth: Payer: Self-pay | Admitting: Hematology and Oncology

## 2021-07-21 ENCOUNTER — Inpatient Hospital Stay: Payer: BC Managed Care – PPO | Attending: Hematology and Oncology | Admitting: Hematology and Oncology

## 2021-07-21 ENCOUNTER — Encounter: Payer: Self-pay | Admitting: Hematology and Oncology

## 2021-07-21 ENCOUNTER — Other Ambulatory Visit: Payer: Self-pay

## 2021-07-21 DIAGNOSIS — C773 Secondary and unspecified malignant neoplasm of axilla and upper limb lymph nodes: Secondary | ICD-10-CM | POA: Diagnosis not present

## 2021-07-21 DIAGNOSIS — R011 Cardiac murmur, unspecified: Secondary | ICD-10-CM | POA: Diagnosis not present

## 2021-07-21 DIAGNOSIS — Z5189 Encounter for other specified aftercare: Secondary | ICD-10-CM | POA: Diagnosis not present

## 2021-07-21 DIAGNOSIS — E1136 Type 2 diabetes mellitus with diabetic cataract: Secondary | ICD-10-CM | POA: Insufficient documentation

## 2021-07-21 DIAGNOSIS — F419 Anxiety disorder, unspecified: Secondary | ICD-10-CM | POA: Diagnosis not present

## 2021-07-21 DIAGNOSIS — C50412 Malignant neoplasm of upper-outer quadrant of left female breast: Secondary | ICD-10-CM

## 2021-07-21 DIAGNOSIS — C50912 Malignant neoplasm of unspecified site of left female breast: Secondary | ICD-10-CM

## 2021-07-21 DIAGNOSIS — Z79899 Other long term (current) drug therapy: Secondary | ICD-10-CM | POA: Insufficient documentation

## 2021-07-21 DIAGNOSIS — Z170 Estrogen receptor positive status [ER+]: Secondary | ICD-10-CM | POA: Insufficient documentation

## 2021-07-21 DIAGNOSIS — E1142 Type 2 diabetes mellitus with diabetic polyneuropathy: Secondary | ICD-10-CM | POA: Diagnosis not present

## 2021-07-21 DIAGNOSIS — M8580 Other specified disorders of bone density and structure, unspecified site: Secondary | ICD-10-CM | POA: Diagnosis not present

## 2021-07-21 DIAGNOSIS — Z8673 Personal history of transient ischemic attack (TIA), and cerebral infarction without residual deficits: Secondary | ICD-10-CM | POA: Insufficient documentation

## 2021-07-21 DIAGNOSIS — M1990 Unspecified osteoarthritis, unspecified site: Secondary | ICD-10-CM | POA: Insufficient documentation

## 2021-07-21 DIAGNOSIS — I480 Paroxysmal atrial fibrillation: Secondary | ICD-10-CM | POA: Diagnosis not present

## 2021-07-21 DIAGNOSIS — I10 Essential (primary) hypertension: Secondary | ICD-10-CM | POA: Diagnosis not present

## 2021-07-21 DIAGNOSIS — E669 Obesity, unspecified: Secondary | ICD-10-CM | POA: Insufficient documentation

## 2021-07-21 DIAGNOSIS — Z5111 Encounter for antineoplastic chemotherapy: Secondary | ICD-10-CM | POA: Insufficient documentation

## 2021-07-21 NOTE — Telephone Encounter (Signed)
Scheduled appointment per 05/01 los. Left message.   ?

## 2021-07-22 ENCOUNTER — Other Ambulatory Visit: Payer: Self-pay | Admitting: General Surgery

## 2021-07-22 ENCOUNTER — Ambulatory Visit
Admission: RE | Admit: 2021-07-22 | Discharge: 2021-07-22 | Disposition: A | Discharge status: Home / Self Care | Payer: BC Managed Care – PPO | Source: Ambulatory Visit | Attending: General Surgery | Admitting: General Surgery

## 2021-07-22 DIAGNOSIS — N6320 Unspecified lump in the left breast, unspecified quadrant: Secondary | ICD-10-CM

## 2021-07-22 DIAGNOSIS — C50912 Malignant neoplasm of unspecified site of left female breast: Secondary | ICD-10-CM | POA: Insufficient documentation

## 2021-07-22 NOTE — Assessment & Plan Note (Signed)
This is a pleasant 76 year old female patient with multiple medical comorbidities including diabetes, hypertension, obesity, arthritis, idiopathic peripheral neuropathy of stroke, obstructive sleep apnea, paroxysmal atrial fibrillation not on any anticoagulation who is now diagnosed with inflammatory breast cancer of the left side, prognostics pending, grade 3.  Apparently there were issues with tissue processing hence prognostics could not be reported.  She is now undergoing another biopsy this week for repeat prognostics. ?We have discussed that given the inflammatory subtype, we do recommend neoadjuvant chemotherapy in most cases however the choice of chemotherapy is undecided since we do not have the prognostics back.  I do agree with port placement upfront.  She is not an excellent candidate for intensive chemotherapy hence may need dose modification or different schedule to make it tolerable.  Post chemotherapy, she will undergo surgery.  Again we could not well into the details of chemotherapy but I briefly discussed the common side effects with chemotherapy including but not limited to fatigue, nausea, vomiting, increased risk of infections, cardiotoxicity with second chemotherapies, neuropathy with second chemotherapies etc.  ? ?She recently had an echocardiogram which was satisfactory to proceed with chemotherapy. ?Port will be placed by Dr. Donne Hazel.  We will have to await prognostics before starting the chemotherapy. ?She will return to clinic as scheduled. ?

## 2021-07-23 ENCOUNTER — Telehealth: Payer: Self-pay

## 2021-07-23 NOTE — Telephone Encounter (Signed)
Previously, the patient had been scheduled for pre-procedure visit 5/5, CT 5/12, and LAAO 5/25. She had already called in and cancelled her visit and CT. ? ?Spoke with patient in detail. At this time, she understands plans for Watchman are cancelled. She will receive treatment for her breast cancer and will keep in touch.  ?She declines a visit at this time and states "this conversation is plenty." ? ?She was grateful for follow-up and agrees with plan.  ?

## 2021-07-23 NOTE — Telephone Encounter (Signed)
-----   Message from Vickie Epley, MD sent at 07/23/2021  7:23 AM EDT ----- ?I agree. Thanks [facility]. ? ?Valetta Fuller, please cancel Ms Cockerell's procedure. Can you offer a phone visit with Sharee Pimple if she wants to discuss further. ? ?Thanks, ? ?Lars Mage ? ? ? ? ?----- Message ----- ?From: [name], MD ?Sent: 07/22/2021   6:46 PM EDT ?To: Vickie Epley, MD ? ?Dr Quentin Ore ?She has just been diagnosed with inflammatory breast cancer and will likely get a port and chemo started very soon. I think her procedure with you likely needs to be cancelled. ?Serita Grammes ? ? ?

## 2021-07-25 ENCOUNTER — Ambulatory Visit: Payer: BC Managed Care – PPO

## 2021-07-25 ENCOUNTER — Other Ambulatory Visit: Payer: Self-pay

## 2021-07-25 ENCOUNTER — Encounter (HOSPITAL_COMMUNITY): Payer: Self-pay | Admitting: General Surgery

## 2021-07-25 NOTE — Anesthesia Preprocedure Evaluation (Addendum)
Anesthesia Evaluation  ?Patient identified by MRN, date of birth, ID band ?Patient awake ? ? ? ?Reviewed: ?Allergy & Precautions, NPO status , Patient's Chart, lab work & pertinent test results ? ?Airway ?Mallampati: III ? ?TM Distance: >3 FB ?Neck ROM: Full ? ? ?Comment: Large neck circumference ?Redundant neck tissue Dental ?no notable dental hx. ? ?  ?Pulmonary ?asthma , sleep apnea and Continuous Positive Airway Pressure Ventilation , Recent URI , Residual Cough,  ? Lungs clear but upper airway cough; phlegm ? ?breath sounds clear to auscultation ? ? ? ? ? ? Cardiovascular ?Exercise Tolerance: Poor ?hypertension, Pt. on medications and Pt. on home beta blockers ?Normal cardiovascular exam+ dysrhythmias (paroxysmal afib) Atrial Fibrillation  ?Rhythm:Regular Rate:Normal ? ?Echo 06/17/21: ?IMPRESSIONS  ??1. Left ventricular ejection fraction, by estimation, is 65 to 70%. The  ?left ventricle has normal function. The left ventricle has no regional  ?wall motion abnormalities. Left ventricular diastolic parameters are  ?indeterminate.  ??2. Right ventricular systolic function is normal. The right ventricular  ?size is normal. Tricuspid regurgitation signal is inadequate for assessing  ?PA pressure.  ??3. Left atrial size was mild to moderately dilated.  ??4. The mitral valve is abnormal. No evidence of mitral valve  ?regurgitation. Mild mitral stenosis. Moderate mitral annular  ?calcification. MV peak gradient, 10.2 mmHg. The mean mitral valve  ?gradient is 3.0 mmHg. ??5. The aortic valve was not well visualized. Aortic valve regurgitation  ?is mild. No aortic stenosis is present.  ??6. The inferior vena cava is normal in size with greater than 50%  ?respiratory variability, suggesting right atrial pressure of 3 mmHg.  ?- Comparison(s): LVEF 60-65%.  ? ?  ?Neuro/Psych ?PSYCHIATRIC DISORDERS Anxiety TIA Neuromuscular disease (peripheral neuropathy) CVA   ? GI/Hepatic ?  ?Endo/Other   ?diabetes ? Renal/GU ?Renal disease (kidney stones)  ? ?  ?Musculoskeletal ? ?(+) Arthritis ,  ? Abdominal ?(+) + obese,   ?Peds ? Hematology ? ?(+) Blood dyscrasia, anemia ,   ?Anesthesia Other Findings ? ? Reproductive/Obstetrics ? ?  ? ? ? ? ? ? ? ? ? ? ? ? ? ?  ?  ? ? ? ? ? ? ?Anesthesia Physical ?Anesthesia Plan ? ?ASA: 4 ? ?Anesthesia Plan: General  ? ?Post-op Pain Management: Tylenol PO (pre-op)*  ? ?Induction: Intravenous ? ?PONV Risk Score and Plan: 3 and Treatment may vary due to age or medical condition, Ondansetron, Dexamethasone and Midazolam ? ?Airway Management Planned: Oral ETT ? ?Additional Equipment: None ? ?Intra-op Plan:  ? ?Post-operative Plan: Extubation in OR ? ?Informed Consent: I have reviewed the patients History and Physical, chart, labs and discussed the procedure including the risks, benefits and alternatives for the proposed anesthesia with the patient or authorized representative who has indicated his/her understanding and acceptance.  ? ? ? ?Dental advisory given ? ?Plan Discussed with: CRNA and Anesthesiologist ? ?Anesthesia Plan Comments: (Viral URI 3-4 weeks ago with 2 Z-packs and inhaler use.  Residual cough. No fever. Lungs clear. Norton Blizzard, MD  ?Will give preop albuterol neb.  ?)  ? ? ? ? ?Anesthesia Quick Evaluation ? ?

## 2021-07-25 NOTE — Progress Notes (Signed)
Spoke with pt for pre-op call. Pt has hx of A-fib, sees Dr. Harl Bowie. She has seen Dr. Quentin Ore and was originally scheduled to have a Watchman Procedure done in May but it has been cancelled since she has gotten this breast cancer diagnosis. Pt only takes Metoprolol for her A-fib. States she was on Eliquis, but it caused her to bleed after a colonoscopy. Pt states she is pre-diabetic. Last A1C was 6.6 on  03/10/21.  ?Pt states she had an URI in April (around the 12th). Was given antibiotics and is feeling better but she still has a congested cough. States she does get clear phlegm up. States she was tested for flu and Covid when she had the URI and both were negative.  ? ?Shower instructions given to pt. ? ?Chart sent to Anesthesia PA ?

## 2021-07-25 NOTE — Progress Notes (Signed)
Anesthesia Chart Review: ? Case: [id number] Date/Time: 07/28/21 0715  ? Procedure: INSERTION PORT-A-CATH  ? Anesthesia type: General  ? Pre-op diagnosis: BREAST CANCER  ? Location: [facility] OR ROOM 01 / [place] OR  ? Surgeons: [name], MD  ? ?  ? ? ?DISCUSSION: Patient is a 76 year old female scheduled for the above procedure. She has PAF with history of epistaxis and bleeding post colonoscopy. She was being considered for Watchman device, but this was been put on hold following her breast cancer diagnosis. She reported she is no longer on Eliquis for PAF. ? ?History includes never smoker, HTN, OSA (uses CPAP), peripheral neuropathy, CVA (small parietal infarct and subacute left parietal infarct 01/18/17), murmur (mild mitral stenosis, mild AI 05/2021 echo), PAF (diagnosed 09/14/16), dyspnea, pre-diabetes, breast cancer (left breast biopsy 07/11/21: invasive mammary carcinoma; 4/23 left axillary LN: metastatic carcinoma), c-spine surgery (1993), morbid obesity.  ? ?She is a same day work-up. Anesthesia team to evaluate on the day of surgery. ? ? ?VS:  ?BP Readings from Last 3 Encounters:  ?07/21/21 (!) 154/98  ?06/03/21 (!) 147/83  ?05/15/21 124/70  ? ?Pulse Readings from Last 3 Encounters:  ?07/21/21 65  ?06/03/21 81  ?05/15/21 72  ?  ? ?PROVIDERS: ?Leeanne Rio, MD is PCP  ?Carlyle Dolly, MD is cardiologist. Last visit 05/15/21.  ?Lars Mage, MD is EP. Last visit 01/16/21. ?[name], MD is HEM-ONC. Last visit 07/21/21 ?Baird Lyons, MD is pulmonologist. Last visit 11/28/20. ?Antony Contras, MD is neurologist. Last visit 11/04/20 with Frann Rider, NP. ? ? ?LABS: For labs on arrival as indicated. A1c 6.6% on 03/10/21 [facility]). ? ? ?IMAGES: ?CT Head 06/03/21: ?IMPRESSION: ?1. Chronic ischemic changes as above. No acute intracranial process. ? ? ?EKG: 01/16/21 ([facility]): Afib at 88 bpm.  Recurrent PVCs.  Low voltage QRS. ? ? ?CV: ?Echo 06/17/21: ?IMPRESSIONS  ? 1. Left ventricular ejection fraction,  by estimation, is 65 to 70%. The  ?left ventricle has normal function. The left ventricle has no regional  ?wall motion abnormalities. Left ventricular diastolic parameters are  ?indeterminate.  ? 2. Right ventricular systolic function is normal. The right ventricular  ?size is normal. Tricuspid regurgitation signal is inadequate for assessing  ?PA pressure.  ? 3. Left atrial size was mild to moderately dilated.  ? 4. The mitral valve is abnormal. No evidence of mitral valve  ?regurgitation. Mild mitral stenosis. Moderate mitral annular  ?calcification. MV peak gradient, 10.2 mmHg. The mean mitral valve  ?gradient is 3.0 mmHg. ? 5. The aortic valve was not well visualized. Aortic valve regurgitation  ?is mild. No aortic stenosis is present.  ? 6. The inferior vena cava is normal in size with greater than 50%  ?respiratory variability, suggesting right atrial pressure of 3 mmHg.  ?- Comparison(s): LVEF 60-65%.  ? ? ?US Carotid 11/13/19: ?Summary:  ?- Right Carotid: Velocities in the right ICA are consistent with a 1-39%  ?stenosis.  ?               The ECA appears <50% stenosed.  ?- Left Carotid: Velocities in the left ICA are consistent with a 1-39%  ?stenosis.  ?              The ECA appears <50% stenosed.  ?- Vertebrals: Bilateral vertebral arteries demonstrate antegrade flow.  ? ? ?CT Coronary 01/19/17: ?IMPRESSION: ?1. Coronary calcium score of 7. This was 30 percentile for age and ?sex matched control. ?2. Normal coronary origin with right dominance. ?3.  Study quality is affected by patient's size and poor contrast ?timing. 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 Aggressive risk factor modification is ?recommended. ?4. Large left atrial appendage without a thrombus. ?5. Mildly dilated pulmonary artery measuring 32 mm suspicious for ?pulmonary hypertension. ?6. Severe mitral annular calcifications. ?  ? ?Past Medical History:  ?Diagnosis Date  ? Allergy   ? Anxiety   ? pt denies,  takes Xanax for her peripheral neuropathy  ? Arthritis   ? shoulders/hips - tx with OTC meds  ? Breast cyst 03/1990  ? pt denies 12/05/13  ? Bronchitis   ? Cancer [facility])   ? Breast  ? Cataract   ? Cervical tab 1972  ? 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 Dyspnea   ? Dysrhythmia   ? Endometrial polyp 04/2000  ? Fall 01/2018  ? nose fracture   ? Heart murmur   ? Hemorrhoids   ? History of kidney stones   ? Hypertension   ? Impaired glucose tolerance   ? Obesity   ? Osteopenia 05/2007  ? Paroxysmal atrial fibrillation ([facility])   ? Peripheral neuropathy   ? PMB (postmenopausal bleeding) 04/2000  ? Pneumonia   ? Pre-diabetes   ? SAB (spontaneous abortion) 1981  ? Sleep apnea   ? CPAP  ? Stroke [facility])   ? no lasting weakness or paralysis, does have some short term memory loss  ? SVD (spontaneous vaginal delivery) 04/1964, 05/1978  ? x3  ? TIA (transient ischemic attack)   ? ? ?Past Surgical History:  ?Procedure Laterality Date  ? BREAST BIOPSY Right 06/2020  ? x2  ? CATARACT EXTRACTION Right 12/07/2018  ? CERVICAL DISC SURGERY  04/1991  ? CESAREAN SECTION  08/1981  ? with BTL  ? COLONOSCOPY N/A 03/19/2017  ? Procedure: COLONOSCOPY;  Surgeon: Doran Stabler, MD;  Location: [place];  Service: Gastroenterology;  Laterality: N/A;  ? DILATION AND CURETTAGE OF UTERUS    ? HYSTEROSCOPY  5/02, 01/2011  ? JOINT REPLACEMENT    ? LITHOTRIPSY  08/30/2014  ? with Dr. Jeffie Pollock at [facility]  ? TOTAL KNEE ARTHROPLASTY Right 08/2006  ? right  ? TOTAL KNEE ARTHROPLASTY Left 06/2008  ? left   ? TUBAL LIGATION    ? WRIST SURGERY Left 06/2005  ? left  ? ? ?MEDICATIONS: ?No current facility-administered medications for this encounter.  ? ? acetaminophen (TYLENOL) 500 MG tablet  ? albuterol (PROAIR HFA) 108 (90 Base) MCG/ACT inhaler  ? ALPRAZolam (XANAX) 0.25 MG tablet  ? chlorthalidone (HYGROTON) 25 MG tablet  ? cholecalciferol (VITAMIN D) 1000 UNITS tablet  ? guaiFENesin (MUCINEX) 600 MG 12 hr tablet  ? levonorgestrel (MIRENA) 20  MCG/DAY IUD  ? losartan (COZAAR) 100 MG tablet  ? metoprolol tartrate (LOPRESSOR) 25 MG tablet  ? Multiple Vitamins-Minerals (MULTIVITAMIN WITH MINERALS) tablet  ? Polyethyl Glycol-Propyl Glycol (SYSTANE OP)  ? potassium chloride SA (KLOR-CON) 20 MEQ tablet  ? sodium chloride (OCEAN) 0.65 % SOLN nasal spray  ? ? ?Myra Gianotti, PA-C ?Surgical Short Stay/Anesthesiology ?[facility] Phone [phone] ?[facility] Phone [phone] ?07/25/2021 12:12 PM ? ? ? ? ? ? ?

## 2021-07-28 ENCOUNTER — Other Ambulatory Visit: Payer: Self-pay

## 2021-07-28 ENCOUNTER — Ambulatory Visit (HOSPITAL_COMMUNITY): Payer: BC Managed Care – PPO | Admitting: Vascular Surgery

## 2021-07-28 ENCOUNTER — Other Ambulatory Visit: Payer: Self-pay | Admitting: Hematology and Oncology

## 2021-07-28 ENCOUNTER — Telehealth: Payer: Self-pay | Admitting: Hematology and Oncology

## 2021-07-28 ENCOUNTER — Ambulatory Visit (HOSPITAL_COMMUNITY)
Admission: RE | Admit: 2021-07-28 | Discharge: 2021-07-28 | Disposition: A | Discharge status: Home / Self Care | Payer: BC Managed Care – PPO | Attending: General Surgery | Admitting: General Surgery

## 2021-07-28 ENCOUNTER — Encounter (HOSPITAL_COMMUNITY)
Admission: RE | Disposition: A | Discharge status: Home / Self Care | Payer: Self-pay | Source: Home / Self Care | Attending: General Surgery

## 2021-07-28 ENCOUNTER — Ambulatory Visit (HOSPITAL_COMMUNITY): Payer: BC Managed Care – PPO

## 2021-07-28 ENCOUNTER — Encounter: Payer: Self-pay | Admitting: *Deleted

## 2021-07-28 DIAGNOSIS — C50912 Malignant neoplasm of unspecified site of left female breast: Secondary | ICD-10-CM

## 2021-07-28 HISTORY — PX: PORTACATH PLACEMENT: SHX2246

## 2021-07-28 HISTORY — DX: Prediabetes: R73.03

## 2021-07-28 HISTORY — DX: Malignant (primary) neoplasm, unspecified: C80.1

## 2021-07-28 HISTORY — DX: Personal history of urinary calculi: Z87.442

## 2021-07-28 HISTORY — DX: Impaired glucose tolerance (oral): R73.02

## 2021-07-28 HISTORY — DX: Pneumonia, unspecified organism: J18.9

## 2021-07-28 LAB — BASIC METABOLIC PANEL
Anion gap: 11 (ref 5–15)
BUN: 12 mg/dL (ref 8–23)
CO2: 27 mmol/L (ref 22–32)
Calcium: 8.7 mg/dL — ABNORMAL LOW (ref 8.9–10.3)
Chloride: 100 mmol/L (ref 98–111)
Creatinine, Ser: 0.88 mg/dL (ref 0.44–1.00)
GFR, Estimated: >60 mL/min (ref >60)
Glucose, Bld: 125 mg/dL — ABNORMAL HIGH (ref 70–99)
Potassium: 3.1 mmol/L — ABNORMAL LOW (ref 3.5–5.1)
Sodium: 138 mmol/L (ref 135–145)

## 2021-07-28 SURGERY — INSERTION, TUNNELED CENTRAL VENOUS DEVICE, WITH PORT
Anesthesia: General | Site: Chest | Laterality: Right

## 2021-07-28 MED ORDER — LIDOCAINE 2% (20 MG/ML) 5 ML SYRINGE
INTRAMUSCULAR | Status: DC | PRN
  Administered 2021-07-28: 80 mg via INTRAVENOUS

## 2021-07-28 MED ORDER — FENTANYL CITRATE (PF) 250 MCG/5ML IJ SOLN
INTRAMUSCULAR | Status: DC | PRN
Start: 2021-07-28 — End: 2021-07-28
  Administered 2021-07-28: 50 ug via INTRAVENOUS

## 2021-07-28 MED ORDER — ACETAMINOPHEN 500 MG PO TABS
1000.0000 mg | ORAL_TABLET | Freq: Once | ORAL | Status: AC
  Administered 2021-07-28: 1000 mg via ORAL
  Filled 2021-07-28: qty 2

## 2021-07-28 MED ORDER — HEPARIN SOD (PORK) LOCK FLUSH 100 UNIT/ML IV SOLN
INTRAVENOUS | Status: AC
  Filled 2021-07-28: qty 5

## 2021-07-28 MED ORDER — ROCURONIUM BROMIDE 100 MG/10ML IV SOLN
INTRAVENOUS | Status: DC | PRN
  Administered 2021-07-28: 40 mg via INTRAVENOUS

## 2021-07-28 MED ORDER — PHENYLEPHRINE 80 MCG/ML (10ML) SYRINGE FOR IV PUSH (FOR BLOOD PRESSURE SUPPORT)
PREFILLED_SYRINGE | INTRAVENOUS | Status: DC | PRN
  Administered 2021-07-28: 80 ug via INTRAVENOUS

## 2021-07-28 MED ORDER — MIDAZOLAM HCL 2 MG/2ML IJ SOLN
INTRAMUSCULAR | Status: AC
  Filled 2021-07-28: qty 2

## 2021-07-28 MED ORDER — SUCCINYLCHOLINE CHLORIDE 200 MG/10ML IV SOSY
PREFILLED_SYRINGE | INTRAVENOUS | Status: DC | PRN
  Administered 2021-07-28: 100 mg via INTRAVENOUS

## 2021-07-28 MED ORDER — BUPIVACAINE-EPINEPHRINE (PF) 0.25% -1:200000 IJ SOLN
INTRAMUSCULAR | Status: AC
  Filled 2021-07-28: qty 30

## 2021-07-28 MED ORDER — FENTANYL CITRATE (PF) 250 MCG/5ML IJ SOLN
INTRAMUSCULAR | Status: AC
  Filled 2021-07-28: qty 5

## 2021-07-28 MED ORDER — HEPARIN SOD (PORK) LOCK FLUSH 100 UNIT/ML IV SOLN
INTRAVENOUS | Status: DC | PRN
  Administered 2021-07-28: 450 [IU] via INTRAVENOUS

## 2021-07-28 MED ORDER — AMISULPRIDE (ANTIEMETIC) 5 MG/2ML IV SOLN: 10.0000 mg | Freq: Once | INTRAVENOUS | Status: DC | PRN

## 2021-07-28 MED ORDER — PROPOFOL 10 MG/ML IV BOLUS
INTRAVENOUS | Status: AC
  Filled 2021-07-28: qty 20

## 2021-07-28 MED ORDER — OXYCODONE HCL 5 MG/5ML PO SOLN: 5.0000 mg | Freq: Once | ORAL | Status: DC | PRN

## 2021-07-28 MED ORDER — ENSURE PRE-SURGERY PO LIQD: 296.0000 mL | Freq: Once | ORAL | Status: DC

## 2021-07-28 MED ORDER — CHLORHEXIDINE GLUCONATE CLOTH 2 % EX PADS: 6.0000 | MEDICATED_PAD | Freq: Once | CUTANEOUS | Status: DC

## 2021-07-28 MED ORDER — ONDANSETRON HCL 4 MG/2ML IJ SOLN: 4.0000 mg | Freq: Once | INTRAMUSCULAR | Status: DC | PRN

## 2021-07-28 MED ORDER — DEXAMETHASONE 4 MG PO TABS: 8.0000 mg | ORAL_TABLET | Freq: Two times a day (BID) | ORAL | 1 refills | Status: DC

## 2021-07-28 MED ORDER — ALBUTEROL SULFATE HFA 108 (90 BASE) MCG/ACT IN AERS
INHALATION_SPRAY | RESPIRATORY_TRACT | Status: DC | PRN
  Administered 2021-07-28: 6 via RESPIRATORY_TRACT

## 2021-07-28 MED ORDER — ALBUTEROL SULFATE (2.5 MG/3ML) 0.083% IN NEBU
INHALATION_SOLUTION | RESPIRATORY_TRACT | Status: AC
  Administered 2021-07-28: 2.5 mg
  Filled 2021-07-28: qty 3

## 2021-07-28 MED ORDER — OXYCODONE HCL 5 MG PO TABS: 5.0000 mg | ORAL_TABLET | Freq: Once | ORAL | Status: DC | PRN

## 2021-07-28 MED ORDER — ACETAMINOPHEN 500 MG PO TABS
1000.0000 mg | ORAL_TABLET | ORAL | Status: AC
Start: 2021-07-28 — End: 2021-07-28

## 2021-07-28 MED ORDER — IPRATROPIUM-ALBUTEROL 0.5-2.5 (3) MG/3ML IN SOLN
RESPIRATORY_TRACT | Status: AC
  Filled 2021-07-28: qty 3

## 2021-07-28 MED ORDER — LACTATED RINGERS IV SOLN: INTRAVENOUS | Status: DC

## 2021-07-28 MED ORDER — HEPARIN 6000 UNIT IRRIGATION SOLUTION
Status: AC
  Filled 2021-07-28: qty 500

## 2021-07-28 MED ORDER — PROCHLORPERAZINE MALEATE 10 MG PO TABS: 10.0000 mg | ORAL_TABLET | Freq: Four times a day (QID) | ORAL | 1 refills | Status: DC | PRN

## 2021-07-28 MED ORDER — HEPARIN 6000 UNIT IRRIGATION SOLUTION
Status: DC | PRN
  Administered 2021-07-28: 1

## 2021-07-28 MED ORDER — FENTANYL CITRATE (PF) 100 MCG/2ML IJ SOLN: 25.0000 ug | INTRAMUSCULAR | Status: DC | PRN

## 2021-07-28 MED ORDER — CHLORHEXIDINE GLUCONATE CLOTH 2 % EX PADS
6.0000 | MEDICATED_PAD | Freq: Once | CUTANEOUS | Status: DC
Start: 2021-07-28 — End: 2021-07-28

## 2021-07-28 MED ORDER — PROPOFOL 10 MG/ML IV BOLUS
INTRAVENOUS | Status: DC | PRN
  Administered 2021-07-28: 150 mg via INTRAVENOUS

## 2021-07-28 MED ORDER — LIDOCAINE-PRILOCAINE 2.5-2.5 % EX CREA: TOPICAL_CREAM | CUTANEOUS | 3 refills | Status: DC

## 2021-07-28 MED ORDER — BUPIVACAINE-EPINEPHRINE (PF) 0.25% -1:200000 IJ SOLN
INTRAMUSCULAR | Status: DC | PRN
  Administered 2021-07-28: 10 mL

## 2021-07-28 MED ORDER — PHENYLEPHRINE HCL-NACL 20-0.9 MG/250ML-% IV SOLN
INTRAVENOUS | Status: DC | PRN
  Administered 2021-07-28: 20 ug/min via INTRAVENOUS

## 2021-07-28 MED ORDER — DEXAMETHASONE SODIUM PHOSPHATE 10 MG/ML IJ SOLN
INTRAMUSCULAR | Status: DC | PRN
  Administered 2021-07-28: 10 mg via INTRAVENOUS

## 2021-07-28 MED ORDER — ORAL CARE MOUTH RINSE: 15.0000 mL | Freq: Once | OROMUCOSAL | Status: AC

## 2021-07-28 MED ORDER — VANCOMYCIN HCL IN DEXTROSE 1-5 GM/200ML-% IV SOLN
1000.0000 mg | INTRAVENOUS | Status: AC
  Administered 2021-07-28: 1000 mg via INTRAVENOUS
  Filled 2021-07-28: qty 200

## 2021-07-28 MED ORDER — IPRATROPIUM-ALBUTEROL 0.5-2.5 (3) MG/3ML IN SOLN
3.0000 mL | Freq: Once | RESPIRATORY_TRACT | Status: AC | PRN
  Administered 2021-07-28: 3 mL via RESPIRATORY_TRACT

## 2021-07-28 MED ORDER — ALBUTEROL SULFATE (2.5 MG/3ML) 0.083% IN NEBU
INHALATION_SOLUTION | RESPIRATORY_TRACT | Status: AC
  Filled 2021-07-28: qty 3

## 2021-07-28 MED ORDER — MIDAZOLAM HCL 2 MG/2ML IJ SOLN
INTRAMUSCULAR | Status: DC | PRN
  Administered 2021-07-28 (×2): 1 mg via INTRAVENOUS

## 2021-07-28 MED ORDER — CHLORHEXIDINE GLUCONATE 0.12 % MT SOLN
15.0000 mL | Freq: Once | OROMUCOSAL | Status: AC
  Administered 2021-07-28: 15 mL via OROMUCOSAL
  Filled 2021-07-28: qty 15

## 2021-07-28 MED ORDER — SUGAMMADEX SODIUM 200 MG/2ML IV SOLN
INTRAVENOUS | Status: DC | PRN
  Administered 2021-07-28: 100 mg via INTRAVENOUS
  Administered 2021-07-28: 200 mg via INTRAVENOUS

## 2021-07-28 MED ORDER — 0.9 % SODIUM CHLORIDE (POUR BTL) OPTIME
TOPICAL | Status: DC | PRN
  Administered 2021-07-28: 1000 mL

## 2021-07-28 MED ORDER — METOPROLOL TARTRATE 12.5 MG HALF TABLET
25.0000 mg | ORAL_TABLET | Freq: Once | ORAL | Status: AC
  Administered 2021-07-28: 25 mg via ORAL
  Filled 2021-07-28: qty 2

## 2021-07-28 MED ORDER — ONDANSETRON HCL 4 MG/2ML IJ SOLN
INTRAMUSCULAR | Status: DC | PRN
  Administered 2021-07-28: 4 mg via INTRAVENOUS

## 2021-07-28 MED ORDER — ONDANSETRON HCL 8 MG PO TABS: 8.0000 mg | ORAL_TABLET | Freq: Two times a day (BID) | ORAL | 1 refills | Status: DC | PRN

## 2021-07-28 SURGICAL SUPPLY — 39 items
ADH SKN CLS APL DERMABOND .7 (GAUZE/BANDAGES/DRESSINGS) ×1
APL PRP STRL LF DISP 70% ISPRP (MISCELLANEOUS) ×1
BAG COUNTER SPONGE SURGICOUNT (BAG) ×2 IMPLANT
BAG DECANTER FOR FLEXI CONT (MISCELLANEOUS) ×2 IMPLANT
BAG SPNG CNTER NS LX DISP (BAG) ×1
CHLORAPREP W/TINT 26 (MISCELLANEOUS) ×2 IMPLANT
COVER PROBE W GEL 5X96 (DRAPES) ×2 IMPLANT
COVER SURGICAL LIGHT HANDLE (MISCELLANEOUS) ×2 IMPLANT
DECANTER SPIKE VIAL GLASS SM (MISCELLANEOUS) ×2 IMPLANT
DERMABOND ADVANCED (GAUZE/BANDAGES/DRESSINGS) ×1
DERMABOND ADVANCED .7 DNX12 (GAUZE/BANDAGES/DRESSINGS) ×1 IMPLANT
DRAPE C-ARM 42X120 X-RAY (DRAPES) ×2 IMPLANT
DRAPE CHEST BREAST 15X10 FENES (DRAPES) ×2 IMPLANT
ELECT CAUTERY BLADE 6.4 (BLADE) ×2 IMPLANT
ELECT REM PT RETURN 9FT ADLT (ELECTROSURGICAL) ×2
ELECTRODE REM PT RTRN 9FT ADLT (ELECTROSURGICAL) ×1 IMPLANT
GAUZE 4X4 16PLY ~~LOC~~+RFID DBL (SPONGE) ×2 IMPLANT
GEL ULTRASOUND 20GR AQUASONIC (MISCELLANEOUS) ×2 IMPLANT
GLOVE BIO SURGEON STRL SZ7 (GLOVE) ×2 IMPLANT
GLOVE BIOGEL PI IND STRL 7.5 (GLOVE) ×1 IMPLANT
GLOVE BIOGEL PI INDICATOR 7.5 (GLOVE) ×1
GOWN STRL REUS W/ TWL LRG LVL3 (GOWN DISPOSABLE) ×2 IMPLANT
GOWN STRL REUS W/TWL LRG LVL3 (GOWN DISPOSABLE) ×4
KIT BASIN OR (CUSTOM PROCEDURE TRAY) ×2 IMPLANT
KIT PORT POWER 8FR ISP CVUE (Port) ×2 IMPLANT
KIT TURNOVER KIT B (KITS) ×2 IMPLANT
NS IRRIG 1000ML POUR BTL (IV SOLUTION) ×2 IMPLANT
PAD ARMBOARD 7.5X6 YLW CONV (MISCELLANEOUS) ×4 IMPLANT
PENCIL BUTTON HOLSTER BLD 10FT (ELECTRODE) ×2 IMPLANT
POSITIONER HEAD DONUT 9IN (MISCELLANEOUS) ×2 IMPLANT
STRIP CLOSURE SKIN 1/2X4 (GAUZE/BANDAGES/DRESSINGS) ×2 IMPLANT
SUT MNCRL AB 4-0 PS2 18 (SUTURE) ×2 IMPLANT
SUT PROLENE 2 0 SH DA (SUTURE) ×2 IMPLANT
SUT VIC AB 3-0 SH 27 (SUTURE) ×2
SUT VIC AB 3-0 SH 27XBRD (SUTURE) ×1 IMPLANT
SYR 5ML LUER SLIP (SYRINGE) ×2 IMPLANT
TOWEL GREEN STERILE (TOWEL DISPOSABLE) ×2 IMPLANT
TOWEL GREEN STERILE FF (TOWEL DISPOSABLE) ×2 IMPLANT
TRAY LAPAROSCOPIC MC (CUSTOM PROCEDURE TRAY) ×2 IMPLANT

## 2021-07-28 NOTE — Op Note (Signed)
Preoperative diagnosis: Left inflammatory breast cancer ?Postoperative diagnosis: Same as above ?Procedure: Right internal jugular port placement with ultrasound guidance ?Surgeon: Dr. Serita Grammes ?Anesthesia: General ?Estimated blood loss: Minimal ?Complications: None ?Drains: None ?Special count was correct at completion ?Disposition recovery stable condition ? ?Indications: 53 yof who presents with new inflammatory breast cancer.  ? ?Procedure: After informed consent was obtained the patient was taken to the operating room.  She was given antibiotics.  SCDs were in place.  She was placed under general anesthesia with an LMA.  She was then prepped and draped in the standard sterile surgical fashion.  Surgical timeout was then performed. ? ?I identified the right IJ with the ultrasound.  I made a small nick in the skin.  I accessed the needle under ultrasound guidance.  The wire was placed.  The wire was in good position on the x-ray.  The ultrasound also showed the wire to be in the vein.  I then infiltrated Marcaine and lidocaine below the clavicle.  I made incision and developed a pocket for the port.  I placed a Environmental manager between those and brought the line through both of those.  I then placed the dilator under fluoroscopic vision over the wire.  This was in good position.  The wire and the dilator were then removed leaving the sheath in place.  The line was placed in the sheath.  The sheath was then removed.  I pulled the line back to be in the distal SVC.  This was all in good position. I then connected the port and placed this in the pocket.  I sutured this into position with a 2-0 Prolene suture.  This flushed easily and and aspirated blood.  I then did 1 final x-ray.  I closed this with 3-0 Vicryl and 4-0 Monocryl.  A Steri-Strip was placed over the incision to protect it.  She tolerated this well was extubated and transferred to recovery stable. ?

## 2021-07-28 NOTE — Progress Notes (Signed)
Dose adjusted [facility] orders placed ?Message sent to scheduling to get chemo scheduled as well as chemo education. ? ?Nicole Cabrera  ? ?

## 2021-07-28 NOTE — Anesthesia Procedure Notes (Signed)
Procedure Name: Intubation ?Date/Time: 07/28/2021 7:37 AM ?Performed by: Terrence Dupont, CRNA ?Pre-anesthesia Checklist: Patient identified, Emergency Drugs available, Suction available and Patient being monitored ?Patient Re-evaluated:Patient Re-evaluated prior to induction ?Oxygen Delivery Method: Circle system utilized ?Preoxygenation: Pre-oxygenation with 100% oxygen ?Induction Type: IV induction ?Ventilation: Mask ventilation without difficulty ?Grade View: Grade I ?Tube type: Oral ?Tube size: 7.0 mm ?Number of attempts: 1 ?Airway Equipment and Method: Stylet and Oral airway ?Placement Confirmation: ETT inserted through vocal cords under direct vision, positive ETCO2 and breath sounds checked- equal and bilateral ?Secured at: 20 cm ?Tube secured with: Tape ?Dental Injury: Teeth and Oropharynx as per pre-operative assessment  ? ? ? ? ?

## 2021-07-28 NOTE — Transfer of Care (Signed)
Immediate Anesthesia Transfer of Care Note ? ?Patient: Nicole Cabrera ? ?Procedure(s) Performed: INSERTION PORT-A-CATH (Right: Chest) ? ?Patient Location: PACU ? ?Anesthesia Type:General ? ?Level of Consciousness: awake and alert  ? ?Airway & Oxygen Therapy: Patient Spontanous Breathing and Patient connected to face mask oxygen ? ?Post-op Assessment: Report given to RN and Post -op Vital signs reviewed and stable ? ?Post vital signs: Reviewed ? ?Last Vitals:  ?Vitals Value Taken Time  ?BP    ?Temp    ?Pulse    ?Resp    ?SpO2    ? ? ?Last Pain:  ?Vitals:  ? 07/28/21 0630  ?TempSrc:   ?PainSc: 0-No pain  ?   ? ?  ? ?Complications: No notable events documented. ?

## 2021-07-28 NOTE — Discharge Instructions (Signed)
PORT-A-CATH: POST OP INSTRUCTIONS  Always review your discharge instruction sheet given to you by the facility where your surgery was performed.   A prescription for pain medication may be given to you upon discharge. Take your pain medication as prescribed, if needed. If narcotic pain medicine is not needed, then you make take acetaminophen (Tylenol) or ibuprofen (Advil) as needed.  Take your usually prescribed medications unless otherwise directed. If you need a refill on your pain medication, please contact our office. All narcotic pain medicine now requires a paper prescription.  Phoned in and fax refills are no longer allowed by law.  Prescriptions will not be filled after 5 pm or on weekends.  You should follow a light diet for the remainder of the day after your procedure. Most patients will experience some mild swelling and/or bruising in the area of the incision. It may take several days to resolve. It is common to experience some constipation if taking pain medication after surgery. Increasing fluid intake and taking a stool softener (such as Colace) will usually help or prevent this problem from occurring. A mild laxative (Milk of Magnesia or Miralax) should be taken according to package directions if there are no bowel movements after 48 hours.  Unless discharge instructions indicate otherwise, you may remove your bandages 48 hours after surgery, and you may shower at that time. You may have steri-strips (small white skin tapes) in place directly over the incision.  These strips should be left on the skin for 7-10 days.  If your surgeon used Dermabond (skin glue) on the incision, you may shower in 24 hours.  The glue will flake off over the next 2-3 weeks.  If your port is left accessed at the end of surgery (needle left in port), the dressing cannot get wet and should only by changed by a healthcare professional. When the port is no longer accessed (when the needle has been removed), follow  step 7.   ACTIVITIES:  Limit activity involving your arms for the next 72 hours. Do no strenuous exercise or activity for 1 week. You may drive when you are no longer taking prescription pain medication, you can comfortably wear a seatbelt, and you can maneuver your car. 10.You may need to see your doctor in the office for a follow-up appointment.  Please       check with your doctor.  11.When you receive a new Port-a-Cath, you will get a product guide and        ID card.  Please keep them in case you need them.  WHEN TO CALL YOUR DOCTOR ([phone]): Fever over 101.0 Chills Continued bleeding from incision Increased redness and tenderness at the site Shortness of breath, difficulty breathing   The clinic staff is available to answer your questions during regular business hours. Please don't hesitate to call and ask to speak to one of the nurses or medical assistants for clinical concerns. If you have a medical emergency, go to the nearest emergency room or call 911.  A surgeon from [facility] is always on call at the hospital.     For further information, please visit [email or web address]      

## 2021-07-28 NOTE — Progress Notes (Signed)
START ON PATHWAY REGIMEN - Breast     Cycle 1: A cycle is 21 days:     Pertuzumab      Trastuzumab-xxxx      Docetaxel      Carboplatin    Cycles 2 through 6: A cycle is every 21 days:     Pertuzumab      Trastuzumab-xxxx      Docetaxel      Carboplatin   **Always confirm dose/schedule in your pharmacy ordering system**  Patient Characteristics: Preoperative or Nonsurgical Candidate (Clinical Staging), Neoadjuvant Therapy followed by Surgery, Invasive Disease, Chemotherapy, HER2 Positive, ER Positive Therapeutic Status: Preoperative or Nonsurgical Candidate (Clinical Staging) AJCC M Category: cM0 AJCC Grade: G3 Breast Surgical Plan: Neoadjuvant Therapy followed by Surgery ER Status: Positive (+) AJCC 8 Stage Grouping: IB HER2 Status: Positive (+) AJCC T Category: cT2 AJCC N Category: cN1 PR Status: Positive (+) Intent of Therapy: Curative Intent, Discussed with Patient 

## 2021-07-28 NOTE — Anesthesia Postprocedure Evaluation (Signed)
Anesthesia Post Note ? ?Patient: Nicole Cabrera ? ?Procedure(s) Performed: INSERTION PORT-A-CATH (Right: Chest) ? ?  ? ?Patient location during evaluation: PACU ?Anesthesia Type: General ?Level of consciousness: awake and alert ?Pain management: pain level controlled ?Vital Signs Assessment: post-procedure vital signs reviewed and stable ?Respiratory status: spontaneous breathing, nonlabored ventilation and respiratory function stable ?Cardiovascular status: blood pressure returned to baseline and stable ?Postop Assessment: no apparent nausea or vomiting ?Anesthetic complications: no ? ? ?No notable events documented. ? ?Last Vitals:  ?Vitals:  ? 07/28/21 0900 07/28/21 0913  ?BP: 132/78 132/78  ?Pulse:    ?Resp:    ?Temp:    ?SpO2:    ?  ?Last Pain:  ?Vitals:  ? 07/28/21 0900  ?TempSrc:   ?PainSc: 0-No pain  ? ? ?  ?  ?  ?  ?  ?  ? ?Merlinda Frederick ? ? ? ? ?

## 2021-07-28 NOTE — Interval H&P Note (Signed)
History and Physical Interval Note: ? ?07/28/2021 ?7:15 AM ? ?Nicole Cabrera  has presented today for surgery, with the diagnosis of BREAST CANCER.  The various methods of treatment have been discussed with the patient and family. After consideration of risks, benefits and other options for treatment, the patient has consented to  Procedure(s): ?INSERTION PORT-A-CATH (N/A) as a surgical intervention.  The patient's history has been reviewed, patient examined, no change in status, stable for surgery.  I have reviewed the patient's chart and labs.  Questions were answered to the patient's satisfaction.   ? ? ?[name] ? ? ?

## 2021-07-28 NOTE — H&P (Signed)
?76 y.o. female who is seen today for new left breast cancer ?I have been following her for a CSL and some fibrocystic changes on the right side. This has remained stable and she had a right-sided mammogram in January to produce stability. She then noted that her left breast increased in size which she describes as overnight. This became red as well. She was noted recently to have B density breast tissue. The right side is stable. The left side shows the diffuse left breast skin thickening and increased trabeculation throughout the central and lateral breast. There is an irregular mass measuring approximately 2 cm in the outer left breast as well as an additional asymmetry in the medial breast measuring 1.5 cm. There are at least 5 abnormal lymph nodes. Ultrasound of the dominant mass shows this to be 1.8 cm in largest dimension. She also was noted to have skin thickening and appearance of possible inflammatory cancer. She underwent a biopsy of the lymph node that is positive. She underwent a biopsy of the breast cancer that shows this to be a grade 3 invasive mammary carcinoma. She comes in today with her husband discuss her options. ? ? ?Medical History: ?Past Medical History:  ?Diagnosis Date  ? Arthritis  ? GERD (gastroesophageal reflux disease)  ? History of cancer  ? History of stroke  ? Hypertension  ? Sleep apnea  ? ?There is no problem list on file for this patient. ? ?Past Surgical History:  ?Procedure Laterality Date  ? CESAREAN SECTION N/A  ? d &c, polyp uterus,cervical N/A  ? REPLACEMENT TOTAL KNEE N/A  ? ? ?Allergies  ?Allergen Reactions  ? Apixaban Other (See Comments)  ?nosebleeds  ? Aspirin Other (See Comments)  ? Atorvastatin Other (See Comments)  ?Muscle cramps ?Muscle cramps ? ? Cephalexin Other (See Comments)  ?GI upset ?GI upset ?GI upset ? ? Ciprofloxacin Other (See Comments)  ?GI upset ?GI upset ?GI upset ? ? Hydroxyzine Hcl Other (See Comments)  ?Pt becomes physchotic. ?Pt becomes  physchotic. ?Other reaction(s): Other (See Comments) ?Pt becomes physchotic. ?Pt becomes physchotic. ? ? Lisinopril Cough  ? Nsaids (Non-Steroidal Anti-Inflammatory Drug) Other (See Comments)  ?nosebleeds  ? Sulfamethoxazole-Trimethoprim Other (See Comments)  ?Unknown  ?Unknown  ?Unknown  ? ? Adhesive Itching, Other (See Comments) and Rash  ?Burning  ? Adhesive Tape-Silicones Itching, Other (See Comments) and Rash  ?Burning  ? Erythromycin Rash  ? Latex Rash  ? Penicillins Rash  ?Has patient had a PCN reaction causing immediate rash, facial/tongue/throat swelling, SOB or lightheadedness with hypotension: Yes ?Has patient had a PCN reaction causing severe rash involving mucus membranes or skin necrosis: No ?Has patient had a PCN reaction that required hospitalization No ?Has patient had a PCN reaction occurring within the last 10 years: No ?If all of the above answers are "NO", then may proceed with Cephalosporin use. ?Has patient had a PCN reaction causing immediate rash, facial/tongue/throat swelling, SOB or lightheadedness with hypotension: Yes ?Has patient had a PCN reaction causing severe rash involving mucus membranes or skin necrosis: No ?Has patient had a PCN reaction that required hospitalization No ?Has patient had a PCN reaction occurring within the last 10 years: No ?If all of the above answers are "NO", then may proceed with Cephalosporin use. ?Has patient had a PCN reaction causing immediate rash, facial/tongue/throat swelling, SOB or lightheadedness with hypotension: Yes ?Has patient had a PCN reaction causing severe rash involving mucus membranes or skin necrosis: No ?Has patient had a PCN  reaction that required hospitalization No ?Has patient had a PCN reaction occurring within the last 10 years: No ?If all of the above answers are "NO", then may proceed with Cephalosporin use. ? ? Spironolactone Nausea  ? Tetracycline Rash  ? ?Current Outpatient Medications on File Prior to Visit  ?Medication Sig  Dispense Refill  ? chlorthalidone 25 MG tablet Take 1 tablet by mouth once daily  ? losartan (COZAAR) 100 MG tablet Take 1 tablet by mouth once daily  ? potassium chloride (KLOR-CON) 20 MEQ ER tablet Take 1 tablet by mouth once daily  ? cholecalciferol (VITAMIN D3) 1000 unit tablet Take by mouth  ? metoprolol tartrate (LOPRESSOR) 25 MG tablet TAKE ONE TABLET BY MOUTH TWICE DAILY, MAY TAKE ONE EXTRA TABLET AS NEEDED FOR PALPITATIONS  ? multivitamin with minerals tablet Take 1 tablet by mouth once daily  ? ? ?Family History  ?Problem Relation Age of Onset  ? Heart valve disease Mother  ? Colon cancer Mother  ? High blood pressure (Hypertension) Father  ? ? ?Social History  ? ?Tobacco Use  ?Smoking Status Never  ?Smokeless Tobacco Never  ? ? ?Social History  ? ?Socioeconomic History  ? Marital status: Married  ?Tobacco Use  ? Smoking status: Never  ? Smokeless tobacco: Never  ?Substance and Sexual Activity  ? Alcohol use: Never  ? Drug use: Never  ? ?Objective:  ? ?Vitals:  ?07/18/21 1211  ?BP: (!) 142/86  ?Pulse: 76  ?Temp: 36.9 ?C (98.5 ?F)  ?SpO2: 91%  ?Weight: (!) 117.1 kg (258 lb 3.2 oz)  ?Height: 151.1 cm (4' 11.5")  ? ?Body mass index is 51.28 kg/m?. ? ?Physical Exam ?Vitals reviewed.  ?Constitutional:  ?Appearance: Normal appearance.  ?Chest:  ?Breasts: ?Right: No inverted nipple, mass or nipple discharge.  ?Left: Swelling, mass and skin change present. No inverted nipple or nipple discharge.  ?Comments: Left axilla I cannot palpate nodes likely related to habitus ?Most of left breast is red, warm and swollen with peau d'orange appearance ?Lymphadenopathy:  ?Upper Body:  ?Right upper body: No supraclavicular or axillary adenopathy.  ?Left upper body: No supraclavicular or axillary adenopathy.  ?Neurological:  ?Mental Status: She is alert.  ? ?Assessment and Plan:  ? ?Inflammatory breast cancer, left (CMS-HCC) ?- Ambulatory Referral to Oncology-Medical ? ?This is clinically and inflammatory breast cancer.  Unfortunately I do not have any of the prognostic panel or other information. I have discussed this with pathology. She is going to have to get another biopsy with radiology and I am going to plan on setting that her for early next week. I think she needs chemotherapy as soon as possible. I discussed the port placement. She will have an oncology appointment on Monday. I discussed that likely down the road this will require a mastectomy as well as a lymph node dissection with the current guidelines. I am going to follow-up with her after her oncology appointment.  ?

## 2021-07-28 NOTE — Telephone Encounter (Signed)
Scheduled appointment per 05/05 los. Patient aware.  ?

## 2021-07-29 ENCOUNTER — Telehealth: Payer: Self-pay | Admitting: *Deleted

## 2021-07-29 ENCOUNTER — Encounter (HOSPITAL_COMMUNITY)
Admission: RE | Admit: 2021-07-29 | Discharge: 2021-07-29 | Disposition: A | Discharge status: Home / Self Care | Payer: BC Managed Care – PPO | Source: Ambulatory Visit | Attending: General Surgery | Admitting: General Surgery

## 2021-07-29 ENCOUNTER — Encounter (HOSPITAL_COMMUNITY): Payer: Self-pay | Admitting: General Surgery

## 2021-07-29 ENCOUNTER — Ambulatory Visit (HOSPITAL_COMMUNITY)
Admission: RE | Admit: 2021-07-29 | Discharge: 2021-07-29 | Disposition: A | Discharge status: Home / Self Care | Payer: BC Managed Care – PPO | Source: Ambulatory Visit | Attending: General Surgery | Admitting: General Surgery

## 2021-07-29 DIAGNOSIS — C50912 Malignant neoplasm of unspecified site of left female breast: Secondary | ICD-10-CM | POA: Insufficient documentation

## 2021-07-29 MED ORDER — SODIUM CHLORIDE (PF) 0.9 % IJ SOLN
INTRAMUSCULAR | Status: AC
  Filled 2021-07-29: qty 50

## 2021-07-29 MED ORDER — IOHEXOL 300 MG/ML  SOLN
100.0000 mL | Freq: Once | INTRAMUSCULAR | Status: AC | PRN
  Administered 2021-07-29: 100 mL via INTRAVENOUS

## 2021-07-29 MED ORDER — TECHNETIUM TC 99M MEDRONATE IV KIT
20.0000 | PACK | Freq: Once | INTRAVENOUS | Status: AC | PRN
  Administered 2021-07-29: 22 via INTRAVENOUS

## 2021-07-29 NOTE — Telephone Encounter (Signed)
Attempted to reach the patient to schedule an appt for IUD removal. No answer and unable to leave a message  ?

## 2021-07-29 NOTE — Progress Notes (Signed)
The following biosimilar Kanjinti (trastuzumab-anns) has been selected for use in this patient. ? ?Kennith Center, Pharm.D., CPP ?07/29/2021'@3'$ :42 PM ? ? ?

## 2021-07-30 ENCOUNTER — Inpatient Hospital Stay (HOSPITAL_BASED_OUTPATIENT_CLINIC_OR_DEPARTMENT_OTHER): Payer: BC Managed Care – PPO | Admitting: Hematology and Oncology

## 2021-07-30 ENCOUNTER — Other Ambulatory Visit: Payer: Self-pay

## 2021-07-30 ENCOUNTER — Encounter: Payer: Self-pay | Admitting: Hematology and Oncology

## 2021-07-30 ENCOUNTER — Inpatient Hospital Stay: Payer: BC Managed Care – PPO

## 2021-07-30 ENCOUNTER — Encounter: Payer: Self-pay | Admitting: *Deleted

## 2021-07-30 DIAGNOSIS — C50912 Malignant neoplasm of unspecified site of left female breast: Secondary | ICD-10-CM

## 2021-07-30 DIAGNOSIS — Z5111 Encounter for antineoplastic chemotherapy: Secondary | ICD-10-CM | POA: Diagnosis not present

## 2021-07-30 NOTE — Assessment & Plan Note (Addendum)
This is a pleasant 76 year old female patient with multiple medical comorbidities including diabetes, hypertension, obesity, arthritis, idiopathic peripheral neuropathy of stroke, obstructive sleep apnea, paroxysmal atrial fibrillation not on any anticoagulation who is now diagnosed with inflammatory breast cancer of the left side, prognostics pending, grade 3.  Apparently there were issues with tissue processing hence prognostics could not be reported.  She is now undergoing another biopsy this week for repeat prognostics. ?We have discussed that given the inflammatory subtype, we do recommend neoadjuvant chemotherapy in most cases however the choice of chemotherapy is undecided since we do not have the prognostics back.  I do agree with port placement upfront.  She is not an excellent candidate for intensive chemotherapy hence may need dose modification or different schedule to make it tolerable.  Post chemotherapy, she will undergo surgery.  Again we could not well into the details of chemotherapy but I briefly discussed the common side effects with chemotherapy including but not limited to fatigue, nausea, vomiting, increased risk of infections, cardiotoxicity with second chemotherapies, neuropathy with second chemotherapies etc.  ? ?She recently had an echocardiogram which was satisfactory to proceed with chemotherapy. ? ?She is here to discuss about her staging results.  CT chest abdomen pelvis and bone scan suggested possible metastatic disease in the bone and have recommended additional evaluation with PET or MRI.  However given inflammatory histology and HER2 amplified breast cancer, I think it is reasonable to proceed with chemotherapy and simultaneously proceed with PET evaluation.  We could try and omit carboplatin for the first cycle if this is indeed metastatic disease. ? ?I have discussed about omitting carboplatin for the first cycle and proceeding with docetaxel/Herceptin/Perjeta alone.  I have also  ordered a PET/CT as recommended by radiology for further evaluation of the bone disease.  She understands that if this is indeed metastatic, unfortunately this is not curable however we will continue to treat her with combination of Herceptin directed therapy as well as chemotherapy. ? ?She is agreeable to proceeding with chemotherapy tomorrow, PET in 2 weeks and return to clinic in 1 week for televisit and before cycle 2-day 1 in person ?

## 2021-07-30 NOTE — Telephone Encounter (Signed)
Attempted to reach pt. Unable to reach her. LVM for return call.  ?

## 2021-07-30 NOTE — Progress Notes (Signed)
?Radiation Oncology         [phone] ?________________________________ ? ?Initial Outpatient Consultation - Conducted via telephone due to current COVID-19 concerns for limiting patient exposure ? ?I spoke with the patient to conduct this consult visit via telephone to spare the patient unnecessary potential exposure in the healthcare setting during the current COVID-19 pandemic. The patient was notified in advance and was offered a [place] meeting to allow for face to face communication but unfortunately reported that they did not have the appropriate resources/technology to support such a visit and instead preferred to proceed with a telephone consult.  ? ?Name: Nicole Cabrera        MRN: [id number]  ?Date of Service: 07/31/2021 DOB: [birth year] ? ?[id number], Nicole Kindred, MD  [name], MD    ? ?REFERRING PHYSICIAN: [name], MD ? ? 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 ? ? ?HISTORY OF PRESENT ILLNESS: Nicole Cabrera is a 76 y.o. female seen at the request of Dr. Chryl Heck for a diagnosis of left breast cancer. The patient was noted to have calcifications in the right breast by mammogram in 2022 that led to a biopsy in the right breast showing a complex sclerosing lesion.  She was being followed for this and diagnostic mammogram of both breasts on 06/23/2021 showed no new suspicious findings in the right breast but identified diffuse left breast skin thickening and increased asymmetry.  In addition a 2 cm mass was in the left outer lower and additional mass in the medial breast measuring up to 1.5 cm.  Further ultrasound measurements showed the 1.8 cm area in the 2 o'clock position and axillary ultrasound showed at least 5 abnormal appearing lymph nodes.  She underwent biopsies, and the mass in the 2 o'clock position showed grade 3 invasive ductal carcinoma.  Her lymph node was also positive that was sampled additional sampling again confirmed grade 3 invasive  ductal carcinoma and her tumor was ER positive strong PR positive HER2 positive with a Ki-67 of 80%.  Her case was discussed in multidisciplinary oncology conference and recommendations for modified radical mastectomy have been made given the updates since our conference showing a prognostic panel, she also has discussed neoadjuvant chemotherapy however she also has a bone scan from 07/29/2021 showing heterogeneous osseous uptake throughout the entire thoracolumbar spine multiple ribs concerning for metastatic disease a CT chest abdomen pelvis tonight showed diffuse marrow heterogeneity with scattered ill-defined sclerotic lesions also throughout the thoracolumbar spine.  A PET scan has been ordered.  She is seen today to discuss the role of radiotherapy in her diagnosis.. ? ? ? ?PREVIOUS RADIATION THERAPY: No ? ? ?PAST MEDICAL HISTORY:  ?Past Medical History:  ?Diagnosis Date  ? Allergy   ? Anxiety   ? pt denies, takes Xanax for her peripheral neuropathy  ? Arthritis   ? shoulders/hips - tx with OTC meds  ? Breast cyst 03/1990  ? pt denies 12/05/13  ? Bronchitis   ? Cancer [facility])   ? Breast  ? Cataract   ? Cervical tab 1972  ? 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 Dyspnea   ? Dysrhythmia   ? Endometrial polyp 04/2000  ? Fall 01/2018  ? nose fracture   ? Heart murmur   ? Hemorrhoids   ? History of kidney stones   ? Hypertension   ? Impaired glucose tolerance   ? Obesity   ? Osteopenia 05/2007  ? Paroxysmal atrial fibrillation (HCC)   ?  Peripheral neuropathy   ? PMB (postmenopausal bleeding) 04/2000  ? Pneumonia   ? Pre-diabetes   ? SAB (spontaneous abortion) 1981  ? Sleep apnea   ? CPAP  ? Stroke [facility])   ? no lasting weakness or paralysis, does have some short term memory loss  ? SVD (spontaneous vaginal delivery) 04/1964, 05/1978  ? x3  ? TIA (transient ischemic attack)   ?   ? ? ?PAST SURGICAL HISTORY: ?Past Surgical History:  ?Procedure Laterality Date  ? BREAST BIOPSY Right 06/2020  ? x2  ? CATARACT  EXTRACTION Right 12/07/2018  ? CERVICAL DISC SURGERY  04/1991  ? CESAREAN SECTION  08/1981  ? with BTL  ? COLONOSCOPY N/A 03/19/2017  ? Procedure: COLONOSCOPY;  Surgeon: Doran Stabler, MD;  Location: [place];  Service: Gastroenterology;  Laterality: N/A;  ? DILATION AND CURETTAGE OF UTERUS    ? HYSTEROSCOPY  5/02, 01/2011  ? JOINT REPLACEMENT    ? LITHOTRIPSY  08/30/2014  ? with Dr. Jeffie Pollock at [facility]  ? PORTACATH PLACEMENT Right 07/28/2021  ? Procedure: INSERTION PORT-A-CATH;  Surgeon: [name], MD;  Location: [place];  Service: General;  Laterality: Right;  ? TOTAL KNEE ARTHROPLASTY Right 08/2006  ? right  ? TOTAL KNEE ARTHROPLASTY Left 06/2008  ? left   ? TUBAL LIGATION    ? WRIST SURGERY Left 06/2005  ? left  ? ? ? ?FAMILY HISTORY:  ?Family History  ?Problem Relation Age of Onset  ? Heart disease Father   ? Colon cancer Mother   ? Heart disease Mother   ? Breast cancer Maternal Aunt   ? Colon cancer Maternal Aunt   ? Colon cancer Maternal Uncle   ? Ehlers-Danlos syndrome Other   ?     neice-being tested 4/15  ? Stomach cancer Neg Hx   ? Pancreatic cancer Neg Hx   ? Endometrial cancer Neg Hx   ? Ovarian cancer Neg Hx   ? ? ? ?SOCIAL HISTORY:  reports that she has never smoked. She has never used smokeless tobacco. She reports that she does not drink alcohol and does not use drugs. The patient is married and lives in [place].  ? ? ?ALLERGIES: Bactrim [sulfamethoxazole-trimethoprim], Ciprofloxacin, Hydroxyzine hcl, Keflet [cephalexin], Lipitor [atorvastatin], Lisinopril, Spironolactone, Vistaril [hydroxyzine hcl], Erythromycin, Latex, Penicillins, Tape, and Tetracycline ? ? ?MEDICATIONS:  ?Current Outpatient Medications  ?Medication Sig Dispense Refill  ? acetaminophen (TYLENOL) 500 MG tablet Take 1,000 mg by mouth every 6 (six) hours as needed for moderate pain or headache.     ? albuterol (PROAIR HFA) 108 (90 Base) MCG/ACT inhaler INHALE TWO PUFFS BY MOUTH EVERY 6 HOURS AS NEEDED FOR WHEEZING AND  SHORTNESS OF BREATH 18 g 12  ? ALPRAZolam (XANAX) 0.25 MG tablet Take 0.25 mg by mouth daily as needed (peripheral neuropathy).    ? chlorthalidone (HYGROTON) 25 MG tablet TAKE ONE TABLET BY MOUTH DAILY. 90 tablet 3  ? cholecalciferol (VITAMIN D) 1000 UNITS tablet Take 1,000 Units by mouth every morning.     ? dexamethasone (DECADRON) 4 MG tablet Take 2 tablets (8 mg total) by mouth 2 (two) times daily. Start the day before Taxotere. Then take daily x 3 days after chemotherapy. 30 tablet 1  ? guaiFENesin (MUCINEX) 600 MG 12 hr tablet Take 600 mg by mouth daily as needed for cough.    ? levonorgestrel (MIRENA) 20 MCG/DAY IUD 1 each by Intrauterine route once. Timed release progesterone    ? lidocaine-prilocaine (EMLA) cream  Apply to affected area once 30 g 3  ? losartan (COZAAR) 100 MG tablet TAKE ONE TABLET BY MOUTH DAILY. 90 tablet 3  ? metoprolol tartrate (LOPRESSOR) 25 MG tablet TAKE ONE TABLET BY MOUTH TWICE DAILY, MAY TAKE ONE EXTRA TABLET AS NEEDED FOR PALPITATIONS 75 tablet 3  ? Multiple Vitamins-Minerals (MULTIVITAMIN WITH MINERALS) tablet Take 1 tablet by mouth daily. Woman's 50 +    ? ondansetron (ZOFRAN) 8 MG tablet Take 1 tablet (8 mg total) by mouth 2 (two) times daily as needed (Nausea or vomiting). Start on the third day after chemotherapy. 30 tablet 1  ? Polyethyl Glycol-Propyl Glycol (SYSTANE OP) Place 1 drop into both eyes daily as needed (dry eyes).     ? potassium chloride SA (KLOR-CON) 20 MEQ tablet TAKE ONE TABLET BY MOUTH DAILY. 90 tablet 3  ? prochlorperazine (COMPAZINE) 10 MG tablet Take 1 tablet (10 mg total) by mouth every 6 (six) hours as needed (Nausea or vomiting). 30 tablet 1  ? sodium chloride (OCEAN) 0.65 % SOLN nasal spray Place 1 spray into both nostrils as needed for congestion.    ? ?No current facility-administered medications for this visit.  ? ? ? ?REVIEW OF SYSTEMS: On review of systems, the patient reports that she is doing pretty well. She's navigating her new diagnosis at  this time, but is anxious to have final results on her PET scan. She reports occasional pain in her low back when coughing as she did a few weeks ago with an upper respiratory illness. She desires all her care in [place]

## 2021-07-30 NOTE — Progress Notes (Signed)
New Breast Cancer Diagnosis: Left Breast ? ?Did patient present with symptoms (if so, please note symptoms) or screening mammography?: Mammogram showed an irregular mass  measuring approximately 2 cm in the outer left breast as well as an additional asymmetry in the medial breast measuring 1.5 cm. There are at least 5 abnormal lymph nodes. Ultrasound of the dominant mass shows this to be 1.8 cm in largest dimension. She also was noted to have skin thickening and appearance of possible inflammatory cancer.  ? ?Location and Extent of disease :left breast. Located at 2 o'clock position, measured 1.8 cm in greatest dimension. Adenopathy yes. ? ?Bone Scan 07/29/2021: Heterogeneous osseous uptake throughout the thoracolumbar spine and multiple ribs, suspicious for metastatic disease. ? ? ?Histology per Pathology Report: grade 3, Invasive Ductal Carcinoma 07/22/2021 ? ?Receptor Status: ER(positive), PR (positive), Her2-neu (positive), Ki-(80%) ? ? ?Surgeon and surgical plan, if any:  ?Dr. Donne Hazel 07/28/2021 ?-I have discussed this with pathology. She is going to have to get another biopsy with radiology and I am going to plan on setting that her for early next week.  ?-I think she needs chemotherapy as soon as possible. I discussed the port placement. She will have an oncology appointment on Monday.  ?-I discussed that likely down the road this will require a mastectomy as well as a lymph node dissection with the current guidelines. I am going to follow-up with her after her oncology appointment.  ?-Port Insertion 07/28/2021 ? ? ?Medical oncologist, treatment if any:   ?Dr. Chryl Heck 07/30/2021 ?- CT chest abdomen pelvis and bone scan suggested possible metastatic disease in the bone and have recommended additional evaluation with PET or MRI. ?- However given inflammatory histology and HER2 amplified breast cancer, I think it is reasonable to proceed with chemotherapy and simultaneously proceed with PET evaluation. ?-I have discussed  about omitting carboplatin for the first cycle and proceeding with docetaxel/Herceptin/Perjeta alone.  I have also ordered a PET/CT as recommended by radiology for further evaluation of the bone disease.  She understands that if this is indeed metastatic, unfortunately this is not curable however we will continue to treat her with combination of Herceptin directed therapy as well as chemotherapy. ?-She is agreeable to proceeding with chemotherapy tomorrow, PET in 2 weeks and return to clinic in 1 week for televisit and before cycle 2-day 1 in person ?07/21/2021 ?-She is now undergoing another biopsy this week for repeat prognostics. ?-We have discussed that given the inflammatory subtype, we do recommend neoadjuvant chemotherapy in most cases however the choice of chemotherapy is undecided since we do not have the prognostics back.  I do agree with port placement upfront.   ?-She is not an excellent candidate for intensive chemotherapy hence may need dose modification or different schedule to make it tolerable.  ?-Post chemotherapy, she will undergo surgery. ? ? ?Family History of Breast/Ovarian/Prostate Cancer: Maternal Aunt had breast cancer. ? ?Lymphedema issues, if any: No     ? ?Pain issues, if any: No    ? ?SAFETY ISSUES: ?Prior radiation? No ?Pacemaker/ICD? No ?Possible current pregnancy? Postmenopausal ?Is the patient on methotrexate? no ? ?Current Complaints / other details:   ? ?

## 2021-07-30 NOTE — Progress Notes (Signed)
[facility] ?CONSULT NOTE ? ?Patient Care Team: ?Leeanne Rio, MD as PCP - General (Family Medicine) ?Arnoldo Lenis, MD as PCP - Cardiology (Cardiology) ?Patient, No Pcp Per (Inactive) (General Practice) ?Mauro Kaufmann, RN as Oncology Nurse Navigator ?Rockwell Germany, RN as Oncology Nurse Navigator ? ?CHIEF COMPLAINTS/PURPOSE OF CONSULTATION:  ?Newly diagnosed breast cancer ? ?HISTORY OF PRESENTING ILLNESS:  ?Nicole Cabrera 76 y.o. female is here because of recent diagnosis of left IBC ? ?I reviewed her records extensively and collaborated the history with the patient. ? ?SUMMARY OF ONCOLOGIC HISTORY: ?Oncology History  ?Inflammatory breast cancer, left ([place])  ?06/23/2021 Mammogram  ? Suspicious mass in the left breast at 2 o'clock measuring 1.8 cm. ?There is associated diffuse left breast skin thickening and ?increased trabeculation concerning for inflammatory breast cancer. ?There are at least 5 abnormal left axillary lymph nodes. 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 ?  ?07/11/2021 Pathology Results  ? Left needle core biopsy from 2 o'clock position showed invasive mammary carcinoma, grade 3, lymph node biopsy showed metastatic carcinoma associated with minimal residual lymphoid tissue.  Prognostic markers could not be reported due to problems with tissue processing.  She will be undergoing repeat biopsy. ?  ?07/22/2021 Initial Diagnosis  ? Inflammatory breast cancer, left [facility]) ?  ?07/28/2021 Cancer Staging  ? Staging form: Breast, AJCC 8th Edition ?- Clinical: Stage IB (cT2, cN1, cM0, G3, ER+, PR+, HER2+) - Signed by [name], MD on 07/28/2021 ?Histologic grading system: 3 grade system ? ?  ?07/29/2021 Imaging  ? CT chest abdomen pelvis with contrast for staging for inflammatory breast cancer showed diffuse marrow heterogeneity with scattered ill-defined sclerotic lesions and underlying extensive thoracolumbar spondylosis.  Based on today's bone scan early  osseous metastatic disease cannot be excluded.  No other evidence of metastatic disease in the chest abdomen or pelvis.  Known inflammatory left breast cancer with associated asymmetric left axillary lymph nodes.  Bone scan also confirmed heterogeneous osseous uptake throughout the thoracolumbar spine and multiple ribs suspicious for metastatic disease.  Consider further evaluation with PET/CT or MRI of the thoracolumbar spine. ?  ?07/31/2021 -  Chemotherapy  ? Patient is on Treatment Plan : BREAST  Docetaxel + Carboplatin + Trastuzumab + Pertuzumab  (TCHP) q21d   ? ?  ?  ? 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 ?Since last visit, she had imaging which showed concern for osseous metastatic disease.  Further evaluation with PET/CT or MRI of the thoracolumbar spine was recommended. ? ?She is here for follow-up with her husband.  She mentions to me that she has noticed some back pain and now she wonders if this is related to the breast cancer.  She otherwise has done chemo education and is ready to proceed with treatment tomorrow. ? ?3 children 57,43 and 39. She has 3 grandchildren ? ?MEDICAL HISTORY:  ?Past Medical History:  ?Diagnosis Date  ? Allergy   ? Anxiety   ? pt denies, takes Xanax for her peripheral neuropathy  ? Arthritis   ? shoulders/hips - tx with OTC meds  ? Breast cyst 03/1990  ? pt denies 12/05/13  ? Bronchitis   ? Cancer [facility])   ? Breast  ? Cataract   ?  Cervical tab 1972  ? 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 Dyspnea   ? Dysrhythmia   ? Endometrial polyp 04/2000  ? Fall 01/2018  ? nose fracture   ? Heart murmur   ? Hemorrhoids   ? History of kidney stones   ? Hypertension    ? Impaired glucose tolerance   ? Obesity   ? Osteopenia 05/2007  ? Paroxysmal atrial fibrillation (HCC)   ? Peripheral neuropathy   ? PMB (postmenopausal bleeding) 04/2000  ? Pneumonia   ? Pre-diabetes   ? SAB (spontaneous abortion) 1981  ? Sleep apnea   ? CPAP  ? Stroke [facility])   ? no lasting weakness or paralysis, does have some short term memory loss  ? SVD (spontaneous vaginal delivery) 04/1964, 05/1978  ? x3  ? TIA (transient ischemic attack)   ? ? ?SURGICAL HISTORY: ?Past Surgical History:  ?Procedure Laterality Date  ? BREAST BIOPSY Right 06/2020  ? x2  ? CATARACT EXTRACTION Right 12/07/2018  ? CERVICAL DISC SURGERY  04/1991  ? CESAREAN SECTION  08/1981  ? with BTL  ? COLONOSCOPY N/A 03/19/2017  ? Procedure: COLONOSCOPY;  Surgeon: Doran Stabler, MD;  Location: [place];  Service: Gastroenterology;  Laterality: N/A;  ? DILATION AND CURETTAGE OF UTERUS    ? HYSTEROSCOPY  5/02, 01/2011  ? JOINT REPLACEMENT    ? LITHOTRIPSY  08/30/2014  ? with Dr. Jeffie Pollock at [facility]  ? PORTACATH PLACEMENT Right 07/28/2021  ? Procedure: INSERTION PORT-A-CATH;  Surgeon: [name], MD;  Location: [place];  Service: General;  Laterality: Right;  ? TOTAL KNEE ARTHROPLASTY Right 08/2006  ? right  ? TOTAL KNEE ARTHROPLASTY Left 06/2008  ? left   ? TUBAL LIGATION    ? WRIST SURGERY Left 06/2005  ? left  ? ? ?SOCIAL HISTORY: ?Social History  ? ?Socioeconomic History  ? Marital status: Married  ?  Spouse name: [name]  ? Number of children: 3  ? Years of education: college  ? Highest education level: Not on file  ?Occupational History  ? Occupation: homemaker  ?  Employer: UNEMPLOYED  ?  Comment: Home maker  ?Tobacco Use  ? Smoking status: Never  ? Smokeless tobacco: Never  ?Vaping Use  ? Vaping Use: Never used  ?Substance and Sexual Activity  ? Alcohol use: No  ? Drug use: No  ? Sexual activity: Not Currently  ?  Partners: Male  ?  Birth control/protection: Surgical  ?  Comment: BTL  ?Other Topics Concern  ? Not on file   ?Social History Narrative  ? Patient is married [name])  ? Patient has three children.  ? Patient is a homemaker.  ? Education- College  ? Right handed.  ? Caffeine- one coke cola   ?   ?   ? ?Social Determinants of Health  ? ?Financial Resource Strain: Not on file  ?Food Insecurity: Not on file  ?Transportation Needs: Not on file  ?Physical Activity: Not on file  ?Stress: Not on file  ?Social Connections: Not on file  ?Intimate Partner Violence: Not on file  ? ? ?FAMILY HISTORY: ?Family History  ?Problem Relation Age of Onset  ? Heart disease Father   ? Colon cancer Mother   ? Heart disease Mother   ? Breast cancer Maternal Aunt   ? Colon cancer Maternal Aunt   ? Colon cancer Maternal Uncle   ? Ehlers-Danlos syndrome Other   ?     neice-being tested 4/15  ?  Stomach cancer Neg Hx   ? Pancreatic cancer Neg Hx   ? Endometrial cancer Neg Hx   ? Ovarian cancer Neg Hx   ? ? ?ALLERGIES:  is allergic to bactrim [sulfamethoxazole-trimethoprim], ciprofloxacin, hydroxyzine hcl, keflet [cephalexin], lipitor [atorvastatin], lisinopril, spironolactone, vistaril [hydroxyzine hcl], erythromycin, latex, penicillins, tape, and tetracycline. ? ?MEDICATIONS:  ?Current Outpatient Medications  ?Medication Sig Dispense Refill  ? acetaminophen (TYLENOL) 500 MG tablet Take 1,000 mg by mouth every 6 (six) hours as needed for moderate pain or headache.     ? albuterol (PROAIR HFA) 108 (90 Base) MCG/ACT inhaler INHALE TWO PUFFS BY MOUTH EVERY 6 HOURS AS NEEDED FOR WHEEZING AND SHORTNESS OF BREATH 18 g 12  ? ALPRAZolam (XANAX) 0.25 MG tablet Take 0.25 mg by mouth daily as needed (peripheral neuropathy).    ? chlorthalidone (HYGROTON) 25 MG tablet TAKE ONE TABLET BY MOUTH DAILY. 90 tablet 3  ? cholecalciferol (VITAMIN D) 1000 UNITS tablet Take 1,000 Units by mouth every morning.     ? dexamethasone (DECADRON) 4 MG tablet Take 2 tablets (8 mg total) by mouth 2 (two) times daily. Start the day before Taxotere. Then take daily x 3 days after  chemotherapy. 30 tablet 1  ? guaiFENesin (MUCINEX) 600 MG 12 hr tablet Take 600 mg by mouth daily as needed for cough.    ? levonorgestrel (MIRENA) 20 MCG/DAY IUD 1 each by Intrauterine route once. Timed releas

## 2021-07-31 ENCOUNTER — Other Ambulatory Visit: Payer: Self-pay

## 2021-07-31 ENCOUNTER — Ambulatory Visit
Admission: RE | Admit: 2021-07-31 | Discharge: 2021-07-31 | Disposition: A | Discharge status: Home / Self Care | Payer: BC Managed Care – PPO | Source: Ambulatory Visit | Attending: Radiation Oncology | Admitting: Radiation Oncology

## 2021-07-31 ENCOUNTER — Encounter: Payer: Self-pay | Admitting: *Deleted

## 2021-07-31 ENCOUNTER — Encounter: Payer: Self-pay | Admitting: Hematology and Oncology

## 2021-07-31 ENCOUNTER — Encounter: Payer: Self-pay | Admitting: Radiation Oncology

## 2021-07-31 VITALS — Ht 59.0 in | Wt 258.0 lb

## 2021-07-31 DIAGNOSIS — C50912 Malignant neoplasm of unspecified site of left female breast: Secondary | ICD-10-CM

## 2021-07-31 DIAGNOSIS — Z95828 Presence of other vascular implants and grafts: Secondary | ICD-10-CM | POA: Insufficient documentation

## 2021-08-01 ENCOUNTER — Inpatient Hospital Stay: Payer: BC Managed Care – PPO

## 2021-08-01 ENCOUNTER — Encounter: Payer: Self-pay | Admitting: *Deleted

## 2021-08-01 ENCOUNTER — Ambulatory Visit (HOSPITAL_COMMUNITY): Payer: BC Managed Care – PPO

## 2021-08-01 VITALS — BP 156/102 | HR 84 | Temp 98.2°F | Resp 17

## 2021-08-01 DIAGNOSIS — Z95828 Presence of other vascular implants and grafts: Secondary | ICD-10-CM

## 2021-08-01 DIAGNOSIS — C50912 Malignant neoplasm of unspecified site of left female breast: Secondary | ICD-10-CM

## 2021-08-01 DIAGNOSIS — Z5111 Encounter for antineoplastic chemotherapy: Secondary | ICD-10-CM | POA: Diagnosis not present

## 2021-08-01 LAB — CBC WITH DIFFERENTIAL (CANCER CENTER ONLY)
Abs Immature Granulocytes: 0.2 10*3/uL — ABNORMAL HIGH (ref 0.00–0.07)
Basophils Absolute: 0.1 10*3/uL (ref 0.0–0.1)
Basophils Relative: 1 %
Eosinophils Absolute: 0.3 10*3/uL (ref 0.0–0.5)
Eosinophils Relative: 3 %
HCT: 42.5 % (ref 36.0–46.0)
Hemoglobin: 14.1 g/dL (ref 12.0–15.0)
Immature Granulocytes: 2 %
Lymphocytes Relative: 20 %
Lymphs Abs: 2 10*3/uL (ref 0.7–4.0)
MCH: 28.8 pg (ref 26.0–34.0)
MCHC: 33.2 g/dL (ref 30.0–36.0)
MCV: 86.7 fL (ref 80.0–100.0)
Monocytes Absolute: 0.8 10*3/uL (ref 0.1–1.0)
Monocytes Relative: 9 %
Neutro Abs: 6.5 10*3/uL (ref 1.7–7.7)
Neutrophils Relative %: 65 %
Platelet Count: 240 10*3/uL (ref 150–400)
RBC: 4.9 MIL/uL (ref 3.87–5.11)
RDW: 14.2 % (ref 11.5–15.5)
WBC Count: 9.9 10*3/uL (ref 4.0–10.5)
nRBC: 0 % (ref 0.0–0.2)

## 2021-08-01 LAB — CMP (CANCER CENTER ONLY)
ALT: 44 U/L (ref 0–44)
AST: 31 U/L (ref 15–41)
Albumin: 3.5 g/dL (ref 3.5–5.0)
Alkaline Phosphatase: 160 U/L — ABNORMAL HIGH (ref 38–126)
Anion gap: 7 (ref 5–15)
BUN: 18 mg/dL (ref 8–23)
CO2: 30 mmol/L (ref 22–32)
Calcium: 9.1 mg/dL (ref 8.9–10.3)
Chloride: 101 mmol/L (ref 98–111)
Creatinine: 0.84 mg/dL (ref 0.44–1.00)
GFR, Estimated: >60 mL/min (ref >60)
Glucose, Bld: 138 mg/dL — ABNORMAL HIGH (ref 70–99)
Potassium: 3.4 mmol/L — ABNORMAL LOW (ref 3.5–5.1)
Sodium: 138 mmol/L (ref 135–145)
Total Bilirubin: 0.7 mg/dL (ref 0.3–1.2)
Total Protein: 6.6 g/dL (ref 6.5–8.1)

## 2021-08-01 MED ORDER — SODIUM CHLORIDE 0.9 % IV SOLN: Freq: Once | INTRAVENOUS | Status: AC

## 2021-08-01 MED ORDER — SODIUM CHLORIDE 0.9% FLUSH
10.0000 mL | INTRAVENOUS | Status: DC | PRN
  Administered 2021-08-01: 10 mL

## 2021-08-01 MED ORDER — PALONOSETRON HCL INJECTION 0.25 MG/5ML
0.2500 mg | Freq: Once | INTRAVENOUS | Status: AC
  Administered 2021-08-01: 0.25 mg via INTRAVENOUS
  Filled 2021-08-01: qty 5

## 2021-08-01 MED ORDER — ACETAMINOPHEN 325 MG PO TABS
650.0000 mg | ORAL_TABLET | Freq: Once | ORAL | Status: AC
  Administered 2021-08-01: 650 mg via ORAL
  Filled 2021-08-01: qty 2

## 2021-08-01 MED ORDER — DIPHENHYDRAMINE HCL 25 MG PO CAPS
50.0000 mg | ORAL_CAPSULE | Freq: Once | ORAL | Status: AC
  Administered 2021-08-01: 50 mg via ORAL
  Filled 2021-08-01: qty 2

## 2021-08-01 MED ORDER — HEPARIN SOD (PORK) LOCK FLUSH 100 UNIT/ML IV SOLN
500.0000 [IU] | Freq: Once | INTRAVENOUS | Status: AC | PRN
  Administered 2021-08-01: 500 [IU]

## 2021-08-01 MED ORDER — TRASTUZUMAB-ANNS CHEMO 150 MG IV SOLR
8.0000 mg/kg | Freq: Once | INTRAVENOUS | Status: AC
  Administered 2021-08-01: 924 mg via INTRAVENOUS
  Filled 2021-08-01: qty 44

## 2021-08-01 MED ORDER — SODIUM CHLORIDE 0.9 % IV SOLN
840.0000 mg | Freq: Once | INTRAVENOUS | Status: AC
  Administered 2021-08-01: 840 mg via INTRAVENOUS
  Filled 2021-08-01: qty 28

## 2021-08-01 MED ORDER — SODIUM CHLORIDE 0.9 % IV SOLN
10.0000 mg | Freq: Once | INTRAVENOUS | Status: AC
  Administered 2021-08-01: 10 mg via INTRAVENOUS
  Filled 2021-08-01: qty 10

## 2021-08-01 MED ORDER — SODIUM CHLORIDE 0.9 % IV SOLN
150.0000 mg | Freq: Once | INTRAVENOUS | Status: AC
  Administered 2021-08-01: 150 mg via INTRAVENOUS
  Filled 2021-08-01: qty 150

## 2021-08-01 MED ORDER — SODIUM CHLORIDE 0.9 % IV SOLN
50.0000 mg/m2 | Freq: Once | INTRAVENOUS | Status: AC
  Administered 2021-08-01: 110 mg via INTRAVENOUS
  Filled 2021-08-01: qty 11

## 2021-08-01 MED ORDER — SODIUM CHLORIDE 0.9% FLUSH
10.0000 mL | Freq: Once | INTRAVENOUS | Status: AC
  Administered 2021-08-01: 10 mL

## 2021-08-01 NOTE — Progress Notes (Signed)
Per Dr. Chryl Heck, okay to treat with elevated BP 166/103.  RN will recheck in a couple hours per MD request. ?

## 2021-08-01 NOTE — Patient Instructions (Addendum)
[place]  Discharge Instructions: ?Thank you for choosing [facility] to provide your oncology and hematology care.  ? ?If you have a lab appointment with the [facility], please go directly to the [facility] and check in at the registration area. ?  ?Wear comfortable clothing and clothing appropriate for easy access to any Portacath or PICC line.  ? ?We strive to give you quality time with your provider. You may need to reschedule your appointment if you arrive late (15 or more minutes).  Arriving late affects you and other patients whose appointments are after yours.  Also, if you miss three or more appointments without notifying the office, you may be dismissed from the clinic at the provider?s discretion.    ?  ?For prescription refill requests, have your pharmacy contact our office and allow 72 hours for refills to be completed.   ? ?Today you received the following chemotherapy and/or immunotherapy agents: trastuzumab/pertuzumab/docetaxel.    ?  ?To help prevent nausea and vomiting after your treatment, we encourage you to take your nausea medication as directed. ? ?BELOW ARE SYMPTOMS THAT SHOULD BE REPORTED IMMEDIATELY: ?*FEVER GREATER THAN 100.4 F (38 ?C) OR HIGHER ?*CHILLS OR SWEATING ?*NAUSEA AND VOMITING THAT IS NOT CONTROLLED WITH YOUR NAUSEA MEDICATION ?*UNUSUAL SHORTNESS OF BREATH ?*UNUSUAL BRUISING OR BLEEDING ?*URINARY PROBLEMS (pain or burning when urinating, or frequent urination) ?*BOWEL PROBLEMS (unusual diarrhea, constipation, pain near the anus) ?TENDERNESS IN MOUTH AND THROAT WITH OR WITHOUT PRESENCE OF ULCERS (sore throat, sores in mouth, or a toothache) ?UNUSUAL RASH, SWELLING OR PAIN  ?UNUSUAL VAGINAL DISCHARGE OR ITCHING  ? ?Items with * indicate a potential emergency and should be followed up as soon as possible or go to the Emergency Department if any problems should occur. ? ?Please show the CHEMOTHERAPY ALERT CARD or IMMUNOTHERAPY  ALERT CARD at check-in to the Emergency Department and triage nurse. ? ?Should you have questions after your visit or need to cancel or reschedule your appointment, please contact [facility]  Dept: [phone]  and follow the prompts.  Office hours are 8:00 a.m. to 4:30 p.m. Monday - Friday. Please note that voicemails left after 4:00 p.m. may not be returned until the following business day.  We are closed weekends and major holidays. You have access to a nurse at all times for urgent questions. Please call the main number to the clinic Dept: [phone] and follow the prompts. ? ? ?For any non-urgent questions, you may also contact your provider using MyChart. We now offer e-Visits for anyone 9 and older to request care online for non-urgent symptoms. For details visit [email or web address]. ?  ?Also download the MyChart app! Go to the app store, search "MyChart", open the app, select [facility], and log in with your MyChart username and password. ? ?Due to Covid, a mask is required upon entering the hospital/clinic. If you do not have a mask, one will be given to you upon arrival. For doctor visits, patients may have 1 support person aged 62 or older with them. For treatment visits, patients cannot have anyone with them due to current Covid guidelines and our immunocompromised population.  ? ?Trastuzumab injection for infusion ?What is this medication? ?TRASTUZUMAB (tras TOO zoo mab) is a monoclonal antibody. It is used to treat breast cancer and stomach cancer. ?This medicine may be used for other purposes; ask your health care provider or pharmacist if you have questions. ?COMMON BRAND NAME(S):  Herceptin, Galvin Proffer, Trazimera ?What should I tell my care team before I take this medication? ?They need to know if you have any of these conditions: ?heart disease ?heart failure ?lung or breathing disease, like asthma ?an unusual or allergic reaction to  trastuzumab, benzyl alcohol, or other medications, foods, dyes, or preservatives ?pregnant or trying to get pregnant ?breast-feeding ?How should I use this medication? ?This drug is given as an infusion into a vein. It is administered in a hospital or clinic by a specially trained health care professional. ?Talk to your pediatrician regarding the use of this medicine in children. This medicine is not approved for use in children. ?Overdosage: If you think you have taken too much of this medicine contact a poison control center or emergency room at once. ?NOTE: This medicine is only for you. Do not share this medicine with others. ?What if I miss a dose? ?It is important not to miss a dose. Call your doctor or health care professional if you are unable to keep an appointment. ?What may interact with this medication? ?This medicine may interact with the following medications: ?certain types of chemotherapy, such as daunorubicin, doxorubicin, epirubicin, and idarubicin ?This list may not describe all possible interactions. Give your health care provider a list of all the medicines, herbs, non-prescription drugs, or dietary supplements you use. Also tell them if you smoke, drink alcohol, or use illegal drugs. Some items may interact with your medicine. ?What should I watch for while using this medication? ?Visit your doctor for checks on your progress. Report any side effects. Continue your course of treatment even though you feel ill unless your doctor tells you to stop. ?Call your doctor or health care professional for advice if you get a fever, chills or sore throat, or other symptoms of a cold or flu. Do not treat yourself. Try to avoid being around people who are sick. ?You may experience fever, chills and shaking during your first infusion. These effects are usually mild and can be treated with other medicines. Report any side effects during the infusion to your health care professional. Fever and chills usually  do not happen with later infusions. ?Do not become pregnant while taking this medicine or for 7 months after stopping it. Women should inform their doctor if they wish to become pregnant or think they might be pregnant. Women of child-bearing potential will need to have a negative pregnancy test before starting this medicine. There is a potential for serious side effects to an unborn child. Talk to your health care professional or pharmacist for more information. Do not breast-feed an infant while taking this medicine or for 7 months after stopping it. ?Women must use effective birth control with this medicine. ?What side effects may I notice from receiving this medication? ?Side effects that you should report to your doctor or health care professional as soon as possible: ?allergic reactions like skin rash, itching or hives, swelling of the face, lips, or tongue ?chest pain or palpitations ?cough ?dizziness ?feeling faint or lightheaded, falls ?fever ?general ill feeling or flu-like symptoms ?signs of worsening heart failure like breathing problems; swelling in your legs and feet ?unusually weak or tired ?Side effects that usually do not require medical attention (report to your doctor or health care professional if they continue or are bothersome): ?bone pain ?changes in taste ?diarrhea ?joint pain ?nausea/vomiting ?weight loss ?This list may not describe all possible side effects. Call your doctor for medical advice about side effects.  You may report side effects to FDA at 1-800-FDA-1088. ?Where should I keep my medication? ?This drug is given in a hospital or clinic and will not be stored at home. ?NOTE: This sheet is a summary. It may not cover all possible information. If you have questions about this medicine, talk to your doctor, pharmacist, or health care provider. ?? 2023 Elsevier/Gold Standard (2016-03-24 00:00:00) ? ?Pertuzumab injection ?What is this medication? ?PERTUZUMAB (per TOOZ ue mab) is a  monoclonal antibody. It is used to treat breast cancer. ?This medicine may be used for other purposes; ask your health care provider or pharmacist if you have questions. ?COMMON BRAND NAME(S): PERJETA ?What sh

## 2021-08-04 ENCOUNTER — Inpatient Hospital Stay: Payer: BC Managed Care – PPO

## 2021-08-04 ENCOUNTER — Encounter: Payer: Self-pay | Admitting: *Deleted

## 2021-08-04 ENCOUNTER — Other Ambulatory Visit: Payer: Self-pay

## 2021-08-04 ENCOUNTER — Telehealth: Payer: Self-pay | Admitting: *Deleted

## 2021-08-04 VITALS — BP 155/94 | HR 66 | Temp 98.6°F | Resp 18

## 2021-08-04 DIAGNOSIS — Z5111 Encounter for antineoplastic chemotherapy: Secondary | ICD-10-CM | POA: Diagnosis not present

## 2021-08-04 DIAGNOSIS — C50912 Malignant neoplasm of unspecified site of left female breast: Secondary | ICD-10-CM

## 2021-08-04 MED ORDER — PEGFILGRASTIM-CBQV 6 MG/0.6ML ~~LOC~~ SOSY
6.0000 mg | PREFILLED_SYRINGE | Freq: Once | SUBCUTANEOUS | Status: AC
  Administered 2021-08-04: 6 mg via SUBCUTANEOUS
  Filled 2021-08-04: qty 0.6

## 2021-08-04 NOTE — Telephone Encounter (Signed)
Left message for pt to return call to discuss recent treatment.   ?

## 2021-08-04 NOTE — Telephone Encounter (Signed)
-----   Message from Wylene Men, RN sent at 08/01/2021  4:20 PM EDT ----- ?Regarding: IRUKU 1ST TIME THP ?Patient received 1st time THP.  Tolerated well.  No s/s or c/o distress or discomfort due to treatment.  Patient did c/o constipation.  Iruku RN notified and recommendations given to patient.   ? ?

## 2021-08-05 ENCOUNTER — Inpatient Hospital Stay (HOSPITAL_BASED_OUTPATIENT_CLINIC_OR_DEPARTMENT_OTHER): Payer: BC Managed Care – PPO | Admitting: Hematology and Oncology

## 2021-08-05 ENCOUNTER — Encounter: Payer: Self-pay | Admitting: Hematology and Oncology

## 2021-08-05 ENCOUNTER — Inpatient Hospital Stay (HOSPITAL_BASED_OUTPATIENT_CLINIC_OR_DEPARTMENT_OTHER): Payer: BC Managed Care – PPO | Admitting: Gynecologic Oncology

## 2021-08-05 VITALS — BP 159/85 | HR 75 | Temp 97.8°F | Resp 18 | Ht 59.5 in | Wt 260.0 lb

## 2021-08-05 DIAGNOSIS — N8501 Benign endometrial hyperplasia: Secondary | ICD-10-CM

## 2021-08-05 DIAGNOSIS — C50912 Malignant neoplasm of unspecified site of left female breast: Secondary | ICD-10-CM

## 2021-08-05 DIAGNOSIS — Z30432 Encounter for removal of intrauterine contraceptive device: Secondary | ICD-10-CM

## 2021-08-05 DIAGNOSIS — Z5111 Encounter for antineoplastic chemotherapy: Secondary | ICD-10-CM | POA: Diagnosis not present

## 2021-08-05 NOTE — Assessment & Plan Note (Signed)
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 ? ?She is doing well mostly except for some fatigue, some minor amount of blood in stool and a mild episode of nausea. ?She has PET/CT scheduled on 08/13/2021.  If she does not have any evidence of metastatic disease on PET/CT, we will proceed with [facility] as previously planned, dose modified given her age and comorbidities. ? ?If she does have evidence of metastatic disease, then we will continue on the current regimen and if she has complete response, still might consider surgical resection, postmastectomy radiation and likely consider SBRT to the oligometastatic disease. ? ?She will return to the clinic as scheduled ?

## 2021-08-05 NOTE — Patient Instructions (Addendum)
Today, we removed the Mirena IUD from your uterus. You may have some spotting after the procedure today. Please call the office if you develop heavier bleeding, more than spotting. We will plan to have a phone visit in one month with Dr. Berline Lopes to check in and discuss plan moving forward. Please call the office for any questions or concerns at [phone]. ? ? ?

## 2021-08-05 NOTE — Progress Notes (Signed)
[facility] ?CONSULT NOTE ? ?Patient Care Team: ?Leeanne Rio, MD as PCP - General (Family Medicine) ?Arnoldo Lenis, MD as PCP - Cardiology (Cardiology) ?Patient, No Pcp Per (Inactive) (General Practice) ?Mauro Kaufmann, RN as Oncology Nurse Navigator ?Rockwell Germany, RN as Oncology Nurse Navigator ?[name], MD as Consulting Physician (Hematology and Oncology) ? ?CHIEF COMPLAINTS/PURPOSE OF CONSULTATION:  ?Newly diagnosed breast cancer ? ?HISTORY OF PRESENTING ILLNESS:  ?Nicole Cabrera 76 y.o. female is here because of recent diagnosis of left IBC ? ?I reviewed her records extensively and collaborated the history with the patient. ? ?SUMMARY OF ONCOLOGIC HISTORY: ?Oncology History  ?Inflammatory breast cancer, left ([facility])  ?06/23/2021 Mammogram  ? Suspicious mass in the left breast at 2 o'clock measuring 1.8 cm. ?There is associated diffuse left breast skin thickening and ?increased trabeculation concerning for inflammatory breast cancer. ?There are at least 5 abnormal left axillary lymph nodes. 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 ?  ?07/11/2021 Pathology Results  ? Left needle core biopsy from 2 o'clock position showed invasive mammary carcinoma, grade 3, lymph node biopsy showed metastatic carcinoma associated with minimal residual lymphoid tissue.  Prognostic markers could not be reported due to problems with tissue processing.  She will be undergoing repeat biopsy. ?  ?07/22/2021 Initial Diagnosis  ? Inflammatory breast cancer, left [facility]) ?  ?07/28/2021 Cancer Staging  ? Staging form: Breast, AJCC 8th Edition ?- Clinical: Stage IB (cT2, cN1, cM0, G3, ER+, PR+, HER2+) - Signed by [name], MD on 07/28/2021 ?Histologic grading system: 3 grade system ? ?  ?07/29/2021 Imaging  ? CT chest abdomen pelvis with contrast for staging for inflammatory breast cancer showed diffuse marrow heterogeneity with scattered ill-defined sclerotic lesions and underlying  extensive thoracolumbar spondylosis.  Based on today's bone scan early osseous metastatic disease cannot be excluded.  No other evidence of metastatic disease in the chest abdomen or pelvis.  Known inflammatory left breast cancer with associated asymmetric left axillary lymph nodes.  Bone scan also confirmed heterogeneous osseous uptake throughout the thoracolumbar spine and multiple ribs suspicious for metastatic disease.  Consider further evaluation with PET/CT or MRI of the thoracolumbar spine. ?  ?08/01/2021 -  Chemotherapy  ? Patient is on Treatment Plan : BREAST  Docetaxel + Carboplatin + Trastuzumab + Pertuzumab  (TCHP) q21d   ? ?   ? ?This is a very pleasant 76 year old female patient with multiple medical comorbidities including hypertension, obesity, paroxysmal atrial fibrillation, grade 2 peripheral neuropathy at baseline, diabetes with newly diagnosed left breast inflammatory breast cancer with axillary lymph node involvement currently on neoadjuvant therapy with docetaxel, Herceptin and Perjeta, carboplatin held for first cycle since there was some concern for metastatic disease.  She has a PET/CT scheduled on 524.  She is here for telephone visit for quick toxicity check.  She tells me that she has been doing quite well since chemotherapy except for some fatigue.  She had a mild episode of nausea but no vomiting.  She used one of her antinausea medication.  She otherwise has not felt any changes yet.  She has noticed couple episodes of blood in stool, she says its minor amounts, wonders if this is related to her hemorrhoids.  She has started taking some Senokot and she will continue to monitor this. ?Rest of the pertinent 10 point ROS reviewed and negative ? ?3 children 57,43 and 39. She has 3 grandchildren ? ?MEDICAL HISTORY:  ?Past Medical History:  ?  Diagnosis Date  ? Allergy   ? Anxiety   ? pt denies, takes Xanax for her peripheral neuropathy  ? Arthritis   ? shoulders/hips - tx with OTC meds  ?  Breast cyst 03/1990  ? pt denies 12/05/13  ? Bronchitis   ? Cancer [facility])   ? Breast  ? Cataract   ? Cervical tab 1972  ? 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 Dyspnea   ? Dysrhythmia   ? Endometrial polyp 04/2000  ? Fall 01/2018  ? nose fracture   ? Heart murmur   ? Hemorrhoids   ? History of kidney stones   ? Hypertension   ? Impaired glucose tolerance   ? Obesity   ? Osteopenia 05/2007  ? Paroxysmal atrial fibrillation (HCC)   ? Peripheral neuropathy   ? PMB (postmenopausal bleeding) 04/2000  ? Pneumonia   ? Pre-diabetes   ? SAB (spontaneous abortion) 1981  ? Sleep apnea   ? CPAP  ? Stroke [facility])   ? no lasting weakness or paralysis, does have some short term memory loss  ? SVD (spontaneous vaginal delivery) 04/1964, 05/1978  ? x3  ? TIA (transient ischemic attack)   ? ? ?SURGICAL HISTORY: ?Past Surgical History:  ?Procedure Laterality Date  ? BREAST BIOPSY Right 06/2020  ? x2  ? CATARACT EXTRACTION Right 12/07/2018  ? CERVICAL DISC SURGERY  04/1991  ? CESAREAN SECTION  08/1981  ? with BTL  ? COLONOSCOPY N/A 03/19/2017  ? Procedure: COLONOSCOPY;  Surgeon: Doran Stabler, MD;  Location: [facility] [facility];  Service: Gastroenterology;  Laterality: N/A;  ? DILATION AND CURETTAGE OF UTERUS    ? HYSTEROSCOPY  5/02, 01/2011  ? JOINT REPLACEMENT    ? LITHOTRIPSY  08/30/2014  ? with Dr. Jeffie Pollock at [facility]  ? PORTACATH PLACEMENT Right 07/28/2021  ? Procedure: INSERTION PORT-A-CATH;  Surgeon: [name], MD;  Location: [facility];  Service: General;  Laterality: Right;  ? TOTAL KNEE ARTHROPLASTY Right 08/2006  ? right  ? TOTAL KNEE ARTHROPLASTY Left 06/2008  ? left   ? TUBAL LIGATION    ? WRIST SURGERY Left 06/2005  ? left  ? ? ?SOCIAL HISTORY: ?Social History  ? ?Socioeconomic History  ? Marital status: Married  ?  Spouse name: [name]  ? Number of children: 3  ? Years of education: college  ? Highest education level: Not on file  ?Occupational History  ? Occupation: homemaker  ?  Employer: UNEMPLOYED  ?  Comment:  Home maker  ?Tobacco Use  ? Smoking status: Never  ? Smokeless tobacco: Never  ?Vaping Use  ? Vaping Use: Never used  ?Substance and Sexual Activity  ? Alcohol use: No  ? Drug use: No  ? Sexual activity: Not Currently  ?  Partners: Male  ?  Birth control/protection: Surgical  ?  Comment: BTL  ?Other Topics Concern  ? Not on file  ?Social History Narrative  ? Patient is married [name])  ? Patient has three children.  ? Patient is a homemaker.  ? Education- College  ? Right handed.  ? Caffeine- one coke cola   ?   ?   ? ?Social Determinants of Health  ? ?Financial Resource Strain: Not on file  ?Food Insecurity: Not on file  ?Transportation Needs: Not on file  ?Physical Activity: Not on file  ?Stress: Not on file  ?Social Connections: Not on file  ?Intimate Partner Violence: Not At Risk  ? Fear of Current or Ex-Partner: No  ?  Emotionally Abused: No  ? Physically Abused: No  ? Sexually Abused: No  ? ? ?FAMILY HISTORY: ?Family History  ?Problem Relation Age of Onset  ? Heart disease Father   ? Colon cancer Mother   ? Heart disease Mother   ? Breast cancer Maternal Aunt   ? Colon cancer Maternal Aunt   ? Colon cancer Maternal Uncle   ? Ehlers-Danlos syndrome Other   ?     neice-being tested 4/15  ? Stomach cancer Neg Hx   ? Pancreatic cancer Neg Hx   ? Endometrial cancer Neg Hx   ? Ovarian cancer Neg Hx   ? ? ?ALLERGIES:  is allergic to bactrim [sulfamethoxazole-trimethoprim], ciprofloxacin, hydroxyzine hcl, keflet [cephalexin], lipitor [atorvastatin], lisinopril, spironolactone, vistaril [hydroxyzine hcl], erythromycin, latex, penicillins, tape, and tetracycline. ? ?MEDICATIONS:  ?Current Outpatient Medications  ?Medication Sig Dispense Refill  ? acetaminophen (TYLENOL) 500 MG tablet Take 1,000 mg by mouth every 6 (six) hours as needed for moderate pain or headache.     ? albuterol (PROAIR HFA) 108 (90 Base) MCG/ACT inhaler INHALE TWO PUFFS BY MOUTH EVERY 6 HOURS AS NEEDED FOR WHEEZING AND SHORTNESS OF BREATH 18 g 12   ? ALPRAZolam (XANAX) 0.25 MG tablet Take 0.25 mg by mouth daily as needed (peripheral neuropathy).    ? chlorthalidone (HYGROTON) 25 MG tablet TAKE ONE TABLET BY MOUTH DAILY. 90 tablet 3  ? cholecalciferol (VI

## 2021-08-05 NOTE — Progress Notes (Signed)
Gynecologic Oncology Follow up ? ?Nicole Cabrera 76 y.o. female ? ?CC:  ?Chief Complaint  ?Patient presents with  ? Inflammatory breast cancer, left ([place])  ? ?Reason for visit: Mirena IUD removal per request of Dr. Chryl Heck, Medical Oncologist, due to newly diagnosed invasive mammary carcinoma of the left breast, estrogen/progesterone/HER2 positive. ? ?Treatment History: ?08/30/19: EMB in the OR given scant tissue obtained at office visit and difficulty with exam due to body habitus. 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 ?Patient was prescribed Megace. Took this for 9 days but given side effects, discontinued. ?11/13/19: Mirena IUD placed in clinic ?12/05/19: Pelvic ultrasound confirmed expected location of IUD in endometrium of upper uterine segment ?02/27/20: seen in the office with Dr. [name]. still having some spotting.  Had heavy bleeding in august, Eliquis was temporarily stopped. EMB declined. ?08/29/20: see in office with Dr. [name]. Continued spotting, no heavy bleeding. Declined EMB again.  ?10/25/20: EMB showed polypoid fragments of inactive endometrium with hormone effect, no hyperplasia and malignancy.  ? ?Oncology History  ?Inflammatory breast cancer, left ([place])  ?06/23/2021 Mammogram  ?  Suspicious mass in the left breast at 2 o'clock measuring 1.8 cm. ?There is associated diffuse left breast skin thickening and ?increased trabeculation concerning for inflammatory breast cancer. ?There are at least 5 abnormal left axillary lymph nodes. 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 ?   ?07/11/2021 Pathology Results  ?  Left needle core biopsy from 2 o'clock position showed invasive mammary carcinoma, grade 3, lymph node biopsy showed metastatic carcinoma associated with minimal residual lymphoid tissue.  Prognostic markers could not be reported due to problems with tissue processing.  She will be undergoing repeat biopsy. ?   ?07/22/2021 Initial Diagnosis  ?   Inflammatory breast cancer, left [facility]) ?   ?07/28/2021 Cancer Staging  ?  Staging form: Breast, AJCC 8th Edition ?- Clinical: Stage IB (cT2, cN1, cM0, G3, ER+, PR+, HER2+) - Signed by [name], MD on 07/28/2021 ?Histologic grading system: 3 grade system ?  ?   ?07/29/2021 Imaging  ?  CT chest abdomen pelvis with contrast for staging for inflammatory breast cancer showed diffuse marrow heterogeneity with scattered ill-defined sclerotic lesions and underlying extensive thoracolumbar spondylosis.  Based on today's bone scan early osseous metastatic disease cannot be excluded.  No other evidence of metastatic disease in the chest abdomen or pelvis.  Known inflammatory left breast cancer with associated asymmetric left axillary lymph nodes.  Bone scan also confirmed heterogeneous osseous uptake throughout the thoracolumbar spine and multiple ribs suspicious for metastatic disease.  Consider further evaluation with PET/CT or MRI of the thoracolumbar spine. ?   ?08/01/2021 -  Chemotherapy  ?  Patient is on Treatment Plan : BREAST  Docetaxel + Carboplatin + Trastuzumab + Pertuzumab  (TCHP) q21d    ? ? ?Interval History: She presents to the office today for IUD removal. She states she has been doing well since her last visit but has been diagnosed with breast cancer and has started undergoing treatment. She is tolerating the treatment so far. She has had some vaginal spotting since IUD insertion and denies heavy bleeding. For the past two days, she has had bright red rectal bleeding with bowel movements. She thinks she has a history of hemorrhoids. Last colonoscopy in December 2018. ? ?Review of Systems: ?Additional review of systems negative. ? ?Current Meds:  ?Outpatient Encounter Medications as of 08/05/2021  ?Medication Sig  ? acetaminophen (TYLENOL) 500 MG  tablet Take 1,000 mg by mouth every 6 (six) hours as needed for moderate pain or headache.   ? albuterol (PROAIR HFA) 108 (90 Base) MCG/ACT inhaler INHALE TWO PUFFS BY  MOUTH EVERY 6 HOURS AS NEEDED FOR WHEEZING AND SHORTNESS OF BREATH  ? ALPRAZolam (XANAX) 0.25 MG tablet Take 0.25 mg by mouth daily as needed (peripheral neuropathy).  ? chlorthalidone (HYGROTON) 25 MG tablet TAKE ONE TABLET BY MOUTH DAILY.  ? cholecalciferol (VITAMIN D) 1000 UNITS tablet Take 1,000 Units by mouth every morning.   ? dexamethasone (DECADRON) 4 MG tablet Take 2 tablets (8 mg total) by mouth 2 (two) times daily. Start the day before Taxotere. Then take daily x 3 days after chemotherapy.  ? guaiFENesin (MUCINEX) 600 MG 12 hr tablet Take 600 mg by mouth daily as needed for cough.  ? levonorgestrel (MIRENA) 20 MCG/DAY IUD 1 each by Intrauterine route once. Timed release progesterone  ? lidocaine-prilocaine (EMLA) cream Apply to affected area once  ? losartan (COZAAR) 100 MG tablet TAKE ONE TABLET BY MOUTH DAILY.  ? metoprolol tartrate (LOPRESSOR) 25 MG tablet TAKE ONE TABLET BY MOUTH TWICE DAILY, MAY TAKE ONE EXTRA TABLET AS NEEDED FOR PALPITATIONS  ? Multiple Vitamins-Minerals (MULTIVITAMIN WITH MINERALS) tablet Take 1 tablet by mouth daily. Woman's 50 +  ? ondansetron (ZOFRAN) 8 MG tablet Take 1 tablet (8 mg total) by mouth 2 (two) times daily as needed (Nausea or vomiting). Start on the third day after chemotherapy.  ? Polyethyl Glycol-Propyl Glycol (SYSTANE OP) Place 1 drop into both eyes daily as needed (dry eyes).   ? potassium chloride SA (KLOR-CON) 20 MEQ tablet TAKE ONE TABLET BY MOUTH DAILY.  ? prochlorperazine (COMPAZINE) 10 MG tablet Take 1 tablet (10 mg total) by mouth every 6 (six) hours as needed (Nausea or vomiting).  ? sodium chloride (OCEAN) 0.65 % SOLN nasal spray Place 1 spray into both nostrils as needed for congestion.  ? [EXPIRED] dexamethasone (DECADRON) 10 mg in sodium chloride 0.9 % 50 mL IVPB   ? [EXPIRED] DOCEtaxel (TAXOTERE) 110 mg in sodium chloride 0.9 % 250 mL chemo infusion   ? [EXPIRED] fosaprepitant (EMEND) 150 mg in sodium chloride 0.9 % 145 mL IVPB   ? [EXPIRED]  heparin lock flush 100 unit/mL   ? [EXPIRED] pertuzumab (PERJETA) 840 mg in sodium chloride 0.9 % 250 mL chemo infusion   ? [EXPIRED] trastuzumab-anns (KANJINTI) 924 mg in sodium chloride 0.9 % 250 mL chemo infusion   ? [DISCONTINUED] sodium chloride flush (NS) 0.9 % injection 10 mL   ? ?No facility-administered encounter medications on file as of 08/05/2021.  ? ? ?Allergy:  ?Allergies  ?Allergen Reactions  ? Bactrim [Sulfamethoxazole-Trimethoprim] Other (See Comments)  ?  Unknown   ? Ciprofloxacin Other (See Comments)  ?  GI upset  ? Hydroxyzine Hcl   ?  Other reaction(s): Other (See Comments) ?Pt becomes physchotic.  ? Keflet [Cephalexin] Other (See Comments)  ?  GI upset  ? Lipitor [Atorvastatin]   ?  Muscle cramps  ? Lisinopril Cough  ? Spironolactone Nausea Only  ? Vistaril [Hydroxyzine Hcl] Other (See Comments)  ?  Pt becomes physchotic.  ? Erythromycin Rash  ? Latex Rash  ? Penicillins Rash  ?  Has patient had a PCN reaction causing immediate rash, facial/tongue/throat swelling, SOB or lightheadedness with hypotension: Yes ?Has patient had a PCN reaction causing severe rash involving mucus membranes or skin necrosis: No ?Has patient had a PCN reaction that required hospitalization No ?Has  patient had a PCN reaction occurring within the last 10 years: No ?If all of the above answers are "NO", then may proceed with Cephalosporin use. ?  ? Tape Itching, Rash and Other (See Comments)  ?  Burning  ? Tetracycline Rash  ? ? ?Social Hx:   ?Social History  ? ?Socioeconomic History  ? Marital status: Married  ?  Spouse name: [name]  ? Number of children: 3  ? Years of education: college  ? Highest education level: Not on file  ?Occupational History  ? Occupation: homemaker  ?  Employer: UNEMPLOYED  ?  Comment: Home maker  ?Tobacco Use  ? Smoking status: Never  ? Smokeless tobacco: Never  ?Vaping Use  ? Vaping Use: Never used  ?Substance and Sexual Activity  ? Alcohol use: No  ? Drug use: No  ? Sexual activity: Not  Currently  ?  Partners: Male  ?  Birth control/protection: Surgical  ?  Comment: BTL  ?Other Topics Concern  ? Not on file  ?Social History Narrative  ? Patient is married [name])  ? Patient has three children.  ? P

## 2021-08-06 ENCOUNTER — Encounter: Payer: Self-pay | Admitting: Hematology and Oncology

## 2021-08-07 ENCOUNTER — Encounter: Payer: Self-pay | Admitting: *Deleted

## 2021-08-11 ENCOUNTER — Encounter (HOSPITAL_COMMUNITY): Payer: Self-pay | Admitting: General Surgery

## 2021-08-12 ENCOUNTER — Telehealth: Payer: Self-pay

## 2021-08-12 NOTE — Telephone Encounter (Signed)
Called patient to follow-up regarding message left with triage nurse yesterday evening. Patient reports that she is feeling better, but would like to be assessed by her PCP in [place]. Pt declined to come into [facility] clinic for further assessment and possible supportive care.  Patient knows to callback should she change her mind or having any additional questions or concerns.

## 2021-08-13 ENCOUNTER — Ambulatory Visit: Payer: BC Managed Care – PPO | Attending: Hematology and Oncology | Admitting: Rehabilitation

## 2021-08-13 ENCOUNTER — Ambulatory Visit (HOSPITAL_COMMUNITY)
Admission: RE | Admit: 2021-08-13 | Discharge: 2021-08-13 | Disposition: A | Discharge status: Home / Self Care | Payer: BC Managed Care – PPO | Source: Ambulatory Visit | Attending: Hematology and Oncology | Admitting: Hematology and Oncology

## 2021-08-13 ENCOUNTER — Encounter: Payer: Self-pay | Admitting: Rehabilitation

## 2021-08-13 DIAGNOSIS — C50912 Malignant neoplasm of unspecified site of left female breast: Secondary | ICD-10-CM | POA: Insufficient documentation

## 2021-08-13 DIAGNOSIS — R293 Abnormal posture: Secondary | ICD-10-CM | POA: Diagnosis present

## 2021-08-13 LAB — GLUCOSE, CAPILLARY: Glucose-Capillary: 122 mg/dL — ABNORMAL HIGH (ref 70–99)

## 2021-08-13 MED ORDER — FLUDEOXYGLUCOSE F - 18 (FDG) INJECTION
12.2000 | Freq: Once | INTRAVENOUS | Status: AC
  Administered 2021-08-13: 12.2 via INTRAVENOUS

## 2021-08-13 NOTE — Therapy (Signed)
OUTPATIENT PHYSICAL THERAPY BREAST CANCER BASELINE EVALUATION   Patient Name: Nicole Cabrera MRN: [id number] DOB:1945-12-13, 76 y.o., female Today's Date: 08/13/2021   PT End of Session - 08/13/21 1347     Visit Number 1    Number of Visits 2    Date for PT Re-Evaluation 12/03/21    PT Start Time 32    PT Stop Time 1345    PT Time Calculation (min) 36 min    Activity Tolerance Patient tolerated treatment well    Behavior During Therapy [facility] for tasks assessed/performed             Past Medical History:  Diagnosis Date   Allergy    Anxiety    pt denies, takes Xanax for her peripheral neuropathy   Arthritis    shoulders/hips - tx with OTC meds   Breast cyst 03/1990   pt denies 12/05/13   Bronchitis    Cancer ([place])    Breast   Cataract    Cervical tab 1972   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   Dyspnea    Dysrhythmia    Endometrial polyp 04/2000   Fall 01/2018   nose fracture    Heart murmur    Hemorrhoids    History of kidney stones    Hypertension    Impaired glucose tolerance    Obesity    Osteopenia 05/2007   Paroxysmal atrial fibrillation (HCC)    Peripheral neuropathy    PMB (postmenopausal bleeding) 04/2000   Pneumonia    Pre-diabetes    SAB (spontaneous abortion) 1981   Sleep apnea    CPAP   Stroke ([place])    no lasting weakness or paralysis, does have some short term memory loss   SVD (spontaneous vaginal delivery) 04/1964, 05/1978   x3   TIA (transient ischemic attack)    Past Surgical History:  Procedure Laterality Date   BREAST BIOPSY Right 06/2020   x2   CATARACT EXTRACTION Right 12/07/2018   CERVICAL [place] SURGERY  04/1991   CESAREAN SECTION  08/1981   with BTL   COLONOSCOPY N/A 03/19/2017   Procedure: COLONOSCOPY;  Surgeon: Doran Stabler, MD;  Location: [facility];  Service: Gastroenterology;  Laterality: N/A;   DILATION AND CURETTAGE OF UTERUS     HYSTEROSCOPY  5/02, 01/2011   JOINT REPLACEMENT     LITHOTRIPSY   08/30/2014   with Dr. Jeffie Pollock at [place] Right 07/28/2021   Procedure: INSERTION PORT-A-CATH;  Surgeon: [name], MD;  Location: [facility];  Service: General;  Laterality: Right;   TOTAL KNEE ARTHROPLASTY Right 08/2006   right   TOTAL KNEE ARTHROPLASTY Left 06/2008   left    TUBAL LIGATION     WRIST SURGERY Left 06/2005   left   Patient Active Problem List   Diagnosis Date Noted   Port-A-Cath in place 07/31/2021   Inflammatory breast cancer, left ([place]) 07/22/2021   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   Acute ischemic stroke ([place]) 02/08/2019   Acute CVA (cerebrovascular accident) ([place]) 02/07/2019   Coagulopathy ([place]) 02/07/2019   Anticoagulated 02/07/2019   Visual changes 02/07/2019   Leukocytosis 02/07/2019   Hyperglycemia 02/07/2019   Diabetes mellitus without complication ([place])    Hypokalemia 03/19/2017   HLD (hyperlipidemia) 03/19/2017   Hematochezia    Acute blood loss anemia    Rectal bleeding 03/18/2017   Acute GI bleeding    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 Huel Cote  REFERRING PROVIDER: Dr. Chryl Heck  REFERRING DIAG: inflammatory breast cancer  THERAPY DIAG:  Inflammatory carcinoma of left breast ([place])  Abnormal posture  Rationale for Evaluation and Treatment Rehabilitation  ONSET DATE: 06/23/21  SUBJECTIVE                                                                                                                                                                                            SUBJECTIVE STATEMENT: Patient reports she is here today to be seen by her medical team for her newly diagnosed left breast cancer. I have neuropathy already.    PERTINENT HISTORY:  Patient was diagnosed with left inflammatory breast cancer grade 3 with abnormal axilla. CT showing showing possible early metastatic disease thoracolumbar spine and ribs. Pt started neoadjuvant chemotherapy on 08/01/21 with [facility] but now held due to possible metastatic disease. Plan is to continue on current regimen with metastatic disease with consideration of mastectomy with radiation if appropriate. Other hx includes HTN, obesity, A-fib, neuropathy at baseline, DM, OA, bil TKR. Will be seeing Dr. Donne Hazel.    PATIENT GOALS   reduce lymphedema risk and learn post op HEP.   PAIN:  Are you having pain? No   PRECAUTIONS: Active CA   HAND DOMINANCE: right  WEIGHT BEARING RESTRICTIONS No  FALLS:  Has patient fallen in last 6 months? Yes. Number of falls 1 - last week in a hurry to get to the bathroom.   Has RW  LIVING ENVIRONMENT: Patient lives with: husband Ronalee Belts  OCCUPATION: Retired  LEISURE: Film/video editor  PRIOR LEVEL OF FUNCTION: Independent - help with shoes   OBJECTIVE  COGNITION:  Overall cognitive status: Within functional limits for tasks assessed    POSTURE:  Forward head and rounded shoulders posture  UPPER EXTREMITY AROM/PROM:  A/PROM LEFT   eval  Shoulder extension   Shoulder flexion 140  Shoulder abduction 110  Shoulder internal rotation   Shoulder external rotation 45    (Blank rows = not tested)   CERVICAL AROM: All within normal limits:   LYMPHEDEMA ASSESSMENTS:   LANDMARK RIGHT   eval  10 cm proximal to olecranon process 42.5  Olecranon process 39.5  10 cm proximal to  ulnar styloid process 26.5  Just proximal to ulnar styloid process 18.3  Across hand at thumb web space 18.7  At base of  2nd digit 6.5  (Blank rows = not tested)  LANDMARK LEFT   eval  10 cm proximal to olecranon process 46  Olecranon process 37  10 cm proximal to ulnar styloid process 29  Just proximal to ulnar styloid process 18  Across hand at thumb web space 18  At base of 2nd digit 6.5  (Blank rows = not tested) Pt reports this arm has always been larger for no reason - pt also has axillary invovlement   L-DEX LYMPHEDEMA SCREENING: The patient was assessed using the L-Dex machine today to produce a lymphedema index baseline score. The patient will be reassessed on a regular basis (typically every 3 months) to obtain new L-Dex scores. If the score is > 6.5 points away from his/her baseline score indicating onset of subclinical lymphedema, it will be recommended to wear a compression garment for 4 weeks, 12 hours per day and then be reassessed. If the score continues to be > 6.5 points from baseline at reassessment, we will initiate lymphedema treatment. Assessing in this manner has a 95% rate of preventing clinically significant lymphedema.  QUICK DASH SURVEY:56%   PATIENT EDUCATION:  Education details: Lymphedema risk reduction and post op shoulder/posture HEP Person educated: Patient Education method: Explanation, Demonstration, Handout Education comprehension: Patient verbalized understanding and returned demonstration   HOME EXERCISE PROGRAM: Patient was instructed today in a home exercise program today for post op shoulder range of motion and prior to surgery ROM. These included active assist shoulder flexion in sitting, scapular retraction, wall walking with shoulder abduction, and hands behind head external rotation.  She was encouraged to do these twice a day, holding 3 seconds and repeating 5 times when permitted by her physician.   ASSESSMENT:  CLINICAL IMPRESSION: Pt is planning to have a left mastectomy with ALND? Pending PET scan and neoadjuvant chemotherapy findings. Pt already has  limited shoulder AROM for no know reason as well as some edema noted on SOZO and with measurements most likely from axillary involvement.   She will benefit from a post op PT reassessment to determine needs and from L-Dex screens every 3 months for 2 years to detect subclinical lymphedema.  Pt will benefit from skilled therapeutic intervention to improve on the following deficits: Decreased knowledge of precautions, impaired UE functional use, pain, decreased ROM, postural dysfunction.   PT treatment/interventions: ADL/self-care home management, pt/family education, therapeutic exercise  REHAB POTENTIAL: Excellent  CLINICAL DECISION MAKING: Stable/uncomplicated  EVALUATION COMPLEXITY: Low   GOALS: Goals reviewed with patient? YES  LONG TERM GOALS: (STG=LTG)    Name Target Date Goal status  1 Pt will be able to verbalize understanding of pertinent lymphedema risk reduction practices relevant to her dx specifically related to skin care.  Baseline:  No knowledge 08/13/2021 Achieved at eval  2 Pt will be able to return demo and/or verbalize understanding of the post op HEP related to regaining shoulder ROM. Baseline:  No knowledge 08/13/2021 Achieved at eval  3 Pt will be able to verbalize understanding of the importance of attending the post op After Breast CA Class for further lymphedema risk reduction education and therapeutic exercise.  Baseline:  No knowledge 08/13/2021 Achieved at eval  4 Pt will demo she has regained full shoulder ROM and function post operatively compared to baselines.  Baseline: See objective measurements taken today. 12/03/21  PLAN: PT FREQUENCY/DURATION: EVAL and 1 follow up appointment.   PLAN FOR NEXT SESSION: will reassess 3-4 weeks post op to determine needs.   Patient will follow up at outpatient cancer rehab 3-4 weeks following surgery.  If the patient requires physical therapy at that time, a specific plan will be dictated and sent to the referring  physician for approval. The patient was educated today on appropriate basic range of motion exercises to begin post operatively and the importance of attending the After Breast Cancer class following surgery.  Patient was educated today on lymphedema risk reduction practices as it pertains to recommendations that will benefit the patient immediately following surgery.  She verbalized good understanding.    Physical Therapy Information for After Breast Cancer Surgery/Treatment:  Lymphedema is a swelling condition that you may be at risk for in your arm if you have lymph nodes removed from the armpit area.  After a sentinel node biopsy, the risk is approximately 5-9% and is higher after an axillary node dissection.  There is treatment available for this condition and it is not life-threatening.  Contact your physician or physical therapist with concerns. You may begin the 4 shoulder/posture exercises (see additional sheet) when permitted by your physician (typically a week after surgery).  If you have drains, you may need to wait until those are removed before beginning range of motion exercises.  A general recommendation is to not lift your arms above shoulder height until drains are removed.  These exercises should be done to your tolerance and gently.  This is not a "no pain/no gain" type of recovery so listen to your body and stretch into the range of motion that you can tolerate, stopping if you have pain.  If you are having immediate reconstruction, ask your plastic surgeon about doing exercises as he or she may want you to wait. We encourage you to attend the free one time ABC (After Breast Cancer) class offered by [facility].  You will learn information related to lymphedema risk, prevention and treatment and additional exercises to regain mobility following surgery.  You can call [phone] for more information.  This is offered the 1st and 3rd Monday of each month.  You only  attend the class one time. While undergoing any medical procedure or treatment, try to avoid blood pressure being taken or needle sticks from occurring on the arm on the side of cancer.   This recommendation begins after surgery and continues for the rest of your life.  This may help reduce your risk of getting lymphedema (swelling in your arm). An excellent resource for those seeking information on lymphedema is the National Lymphedema Network's web site. It can be accessed at [facility].org If you notice swelling in your hand, arm or breast at any time following surgery (even if it is many years from now), please contact your doctor or physical therapist to discuss this.  Lymphedema can be treated at any time but it is easier for you if it is treated early on.  If you feel like your shoulder motion is not returning to normal in a reasonable amount of time, please contact your surgeon or physical therapist.  [facility] [phone]. [address], [place] [address]  ABC CLASS After Breast Cancer Class  After Breast Cancer Class is a specially designed exercise class to assist you in a safe recover after having breast cancer surgery.  In this class you will learn  how to get back to full function whether your drains were just removed or if you had surgery a month ago.  This one-time class is held the 1st and 3rd Monday of every month from 11:00 a.m. until 12:00 noon virtually.  This class is FREE and space is limited. For more information or to register for the next available class, call [phone].  Class Goals  Understand specific stretches to improve the flexibility of you chest and shoulder. Learn ways to safely strengthen your upper body and improve your posture. Understand the warning signs of infection and why you may be at risk for an arm infection. Learn about Lymphedema and prevention.  ** You do not attend this class until after  surgery.  Drains must be removed to participate  Patient was instructed today in a home exercise program today for post op shoulder range of motion. These included active assist shoulder flexion in sitting, scapular retraction, wall walking with shoulder abduction, and hands behind head external rotation.  She was encouraged to do these twice a day, holding 3 seconds and repeating 5 times when permitted by her physician.    Stark Bray, PT 08/13/2021, 1:50 PM

## 2021-08-14 ENCOUNTER — Encounter (HOSPITAL_COMMUNITY): Payer: Self-pay

## 2021-08-14 ENCOUNTER — Inpatient Hospital Stay (HOSPITAL_COMMUNITY): Admit: 2021-08-14 | Payer: Medicare Other | Admitting: Cardiology

## 2021-08-14 DIAGNOSIS — I4891 Unspecified atrial fibrillation: Secondary | ICD-10-CM

## 2021-08-14 SURGERY — LEFT ATRIAL APPENDAGE OCCLUSION
Anesthesia: General

## 2021-08-15 ENCOUNTER — Encounter: Payer: Self-pay | Admitting: *Deleted

## 2021-08-19 ENCOUNTER — Other Ambulatory Visit: Payer: Self-pay | Admitting: Hematology and Oncology

## 2021-08-19 ENCOUNTER — Encounter: Payer: Self-pay | Admitting: *Deleted

## 2021-08-19 NOTE — Progress Notes (Signed)
I called her with the imaging results. I discussed with the patient about changing the chemotherapy to docetaxel/herceptin/pertuzumab for metastatic breast cancer. Even for the first cycle, I only gave her dose reduced docetaxel along with herceptin/pertuzumab I have dose reduced the docetaxel further for better tolerance. We have discussed the non curative intent of treatment. I will also send a message to radiation oncology and surgical oncology  Ricki Vanhandel

## 2021-08-20 MED FILL — Dexamethasone Sodium Phosphate Inj 100 MG/10ML: INTRAMUSCULAR | Qty: 1 | Status: AC

## 2021-08-20 MED FILL — Fosaprepitant Dimeglumine For IV Infusion 150 MG (Base Eq): INTRAVENOUS | Qty: 5 | Status: AC

## 2021-08-21 ENCOUNTER — Encounter: Payer: Self-pay | Admitting: Hematology and Oncology

## 2021-08-21 ENCOUNTER — Other Ambulatory Visit: Payer: BC Managed Care – PPO

## 2021-08-21 ENCOUNTER — Inpatient Hospital Stay: Payer: BC Managed Care – PPO | Attending: Hematology and Oncology | Admitting: Hematology and Oncology

## 2021-08-21 ENCOUNTER — Other Ambulatory Visit: Payer: Self-pay

## 2021-08-21 ENCOUNTER — Inpatient Hospital Stay: Payer: BC Managed Care – PPO

## 2021-08-21 ENCOUNTER — Encounter: Payer: Self-pay | Admitting: *Deleted

## 2021-08-21 VITALS — BP 123/70 | HR 76 | Resp 18

## 2021-08-21 DIAGNOSIS — B369 Superficial mycosis, unspecified: Secondary | ICD-10-CM | POA: Diagnosis not present

## 2021-08-21 DIAGNOSIS — Z975 Presence of (intrauterine) contraceptive device: Secondary | ICD-10-CM | POA: Diagnosis not present

## 2021-08-21 DIAGNOSIS — C773 Secondary and unspecified malignant neoplasm of axilla and upper limb lymph nodes: Secondary | ICD-10-CM | POA: Diagnosis not present

## 2021-08-21 DIAGNOSIS — R630 Anorexia: Secondary | ICD-10-CM | POA: Diagnosis not present

## 2021-08-21 DIAGNOSIS — N939 Abnormal uterine and vaginal bleeding, unspecified: Secondary | ICD-10-CM | POA: Diagnosis not present

## 2021-08-21 DIAGNOSIS — C50412 Malignant neoplasm of upper-outer quadrant of left female breast: Secondary | ICD-10-CM | POA: Diagnosis present

## 2021-08-21 DIAGNOSIS — Z5112 Encounter for antineoplastic immunotherapy: Secondary | ICD-10-CM | POA: Diagnosis present

## 2021-08-21 DIAGNOSIS — Z803 Family history of malignant neoplasm of breast: Secondary | ICD-10-CM | POA: Insufficient documentation

## 2021-08-21 DIAGNOSIS — Z5189 Encounter for other specified aftercare: Secondary | ICD-10-CM | POA: Insufficient documentation

## 2021-08-21 DIAGNOSIS — Z95828 Presence of other vascular implants and grafts: Secondary | ICD-10-CM

## 2021-08-21 DIAGNOSIS — R197 Diarrhea, unspecified: Secondary | ICD-10-CM | POA: Insufficient documentation

## 2021-08-21 DIAGNOSIS — C50912 Malignant neoplasm of unspecified site of left female breast: Secondary | ICD-10-CM

## 2021-08-21 DIAGNOSIS — Z800 Family history of malignant neoplasm of digestive organs: Secondary | ICD-10-CM | POA: Insufficient documentation

## 2021-08-21 DIAGNOSIS — C7951 Secondary malignant neoplasm of bone: Secondary | ICD-10-CM | POA: Diagnosis not present

## 2021-08-21 DIAGNOSIS — Z5111 Encounter for antineoplastic chemotherapy: Secondary | ICD-10-CM | POA: Insufficient documentation

## 2021-08-21 LAB — CBC WITH DIFFERENTIAL (CANCER CENTER ONLY)
Abs Immature Granulocytes: 0.1 10*3/uL — ABNORMAL HIGH (ref 0.00–0.07)
Basophils Absolute: 0.1 10*3/uL (ref 0.0–0.1)
Basophils Relative: 1 %
Eosinophils Absolute: 0 10*3/uL (ref 0.0–0.5)
Eosinophils Relative: 0 %
HCT: 39.8 % (ref 36.0–46.0)
Hemoglobin: 13.1 g/dL (ref 12.0–15.0)
Immature Granulocytes: 1 %
Lymphocytes Relative: 21 %
Lymphs Abs: 2.1 10*3/uL (ref 0.7–4.0)
MCH: 28.4 pg (ref 26.0–34.0)
MCHC: 32.9 g/dL (ref 30.0–36.0)
MCV: 86.1 fL (ref 80.0–100.0)
Monocytes Absolute: 0.9 10*3/uL (ref 0.1–1.0)
Monocytes Relative: 9 %
Neutro Abs: 6.6 10*3/uL (ref 1.7–7.7)
Neutrophils Relative %: 68 %
Platelet Count: 216 10*3/uL (ref 150–400)
RBC: 4.62 MIL/uL (ref 3.87–5.11)
RDW: 14.7 % (ref 11.5–15.5)
WBC Count: 9.8 10*3/uL (ref 4.0–10.5)
nRBC: 0 % (ref 0.0–0.2)

## 2021-08-21 LAB — CMP (CANCER CENTER ONLY)
ALT: 26 U/L (ref 0–44)
AST: 25 U/L (ref 15–41)
Albumin: 3.5 g/dL (ref 3.5–5.0)
Alkaline Phosphatase: 142 U/L — ABNORMAL HIGH (ref 38–126)
Anion gap: 6 (ref 5–15)
BUN: 14 mg/dL (ref 8–23)
CO2: 30 mmol/L (ref 22–32)
Calcium: 9.1 mg/dL (ref 8.9–10.3)
Chloride: 103 mmol/L (ref 98–111)
Creatinine: 0.73 mg/dL (ref 0.44–1.00)
GFR, Estimated: >60 mL/min (ref >60)
Glucose, Bld: 114 mg/dL — ABNORMAL HIGH (ref 70–99)
Potassium: 3.5 mmol/L (ref 3.5–5.1)
Sodium: 139 mmol/L (ref 135–145)
Total Bilirubin: 0.6 mg/dL (ref 0.3–1.2)
Total Protein: 6 g/dL — ABNORMAL LOW (ref 6.5–8.1)

## 2021-08-21 MED ORDER — SODIUM CHLORIDE 0.9 % IV SOLN
40.0000 mg/m2 | Freq: Once | INTRAVENOUS | Status: AC
  Administered 2021-08-21: 90 mg via INTRAVENOUS
  Filled 2021-08-21: qty 9

## 2021-08-21 MED ORDER — DIPHENHYDRAMINE HCL 25 MG PO CAPS
50.0000 mg | ORAL_CAPSULE | Freq: Once | ORAL | Status: AC
  Administered 2021-08-21: 50 mg via ORAL
  Filled 2021-08-21: qty 2

## 2021-08-21 MED ORDER — HEPARIN SOD (PORK) LOCK FLUSH 100 UNIT/ML IV SOLN
500.0000 [IU] | Freq: Once | INTRAVENOUS | Status: AC | PRN
  Administered 2021-08-21: 500 [IU]

## 2021-08-21 MED ORDER — SODIUM CHLORIDE 0.9 % IV SOLN
10.0000 mg | Freq: Once | INTRAVENOUS | Status: AC
  Administered 2021-08-21: 10 mg via INTRAVENOUS
  Filled 2021-08-21: qty 10

## 2021-08-21 MED ORDER — TRASTUZUMAB-ANNS CHEMO 150 MG IV SOLR
6.0000 mg/kg | Freq: Once | INTRAVENOUS | Status: AC
  Administered 2021-08-21: 693 mg via INTRAVENOUS
  Filled 2021-08-21: qty 33

## 2021-08-21 MED ORDER — SODIUM CHLORIDE 0.9 % IV SOLN: Freq: Once | INTRAVENOUS | Status: AC

## 2021-08-21 MED ORDER — SODIUM CHLORIDE 0.9% FLUSH
10.0000 mL | Freq: Once | INTRAVENOUS | Status: AC
  Administered 2021-08-21: 10 mL

## 2021-08-21 MED ORDER — ACETAMINOPHEN 325 MG PO TABS
650.0000 mg | ORAL_TABLET | Freq: Once | ORAL | Status: AC
  Administered 2021-08-21: 650 mg via ORAL
  Filled 2021-08-21: qty 2

## 2021-08-21 MED ORDER — SODIUM CHLORIDE 0.9 % IV SOLN
150.0000 mg | Freq: Once | INTRAVENOUS | Status: AC
  Administered 2021-08-21: 150 mg via INTRAVENOUS
  Filled 2021-08-21: qty 150

## 2021-08-21 MED ORDER — PALONOSETRON HCL INJECTION 0.25 MG/5ML
0.2500 mg | Freq: Once | INTRAVENOUS | Status: AC
  Administered 2021-08-21: 0.25 mg via INTRAVENOUS
  Filled 2021-08-21: qty 5

## 2021-08-21 MED ORDER — SODIUM CHLORIDE 0.9% FLUSH
10.0000 mL | INTRAVENOUS | Status: DC | PRN
  Administered 2021-08-21: 10 mL

## 2021-08-21 MED ORDER — SODIUM CHLORIDE 0.9 % IV SOLN
420.0000 mg | Freq: Once | INTRAVENOUS | Status: AC
  Administered 2021-08-21: 420 mg via INTRAVENOUS
  Filled 2021-08-21: qty 14

## 2021-08-21 NOTE — Progress Notes (Signed)
[facility] NOTE  Patient Care Team: Leeanne Rio, MD as PCP - General (Family Medicine) Harl Bowie, Alphonse Guild, MD as PCP - Cardiology (Cardiology) Patient, No Pcp Per (Inactive) (General Practice) Mauro Kaufmann, RN as Oncology Nurse Navigator Rockwell Germany, RN as Oncology Nurse Navigator [name], MD as Consulting Physician (Hematology and Oncology)  CHIEF COMPLAINTS/PURPOSE OF CONSULTATION:  Newly diagnosed breast cancer  HISTORY OF PRESENTING ILLNESS:  Nicole Cabrera 76 y.o. female is here because of recent diagnosis of left IBC  I reviewed her records extensively and collaborated the history with the patient.  SUMMARY OF ONCOLOGIC HISTORY: Oncology History  Inflammatory breast cancer, left ([place])  06/23/2021 Mammogram   Suspicious mass in the left breast at 2 o'clock measuring 1.8 cm. There is associated diffuse left breast skin thickening and increased trabeculation concerning for inflammatory breast cancer. There are at least 5 abnormal left axillary lymph nodes. 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   07/11/2021 Pathology Results   Left needle core biopsy from 2 o'clock position showed invasive mammary carcinoma, grade 3, lymph node biopsy showed metastatic carcinoma associated with minimal residual lymphoid tissue.  Prognostic markers could not be reported due to problems with tissue processing.  She will be undergoing repeat biopsy.   07/22/2021 Initial Diagnosis   Inflammatory breast cancer, left ([place])   07/28/2021 Cancer Staging   Staging form: Breast, AJCC 8th Edition - Clinical: Stage IB (cT2, cN1, cM0, G3, ER+, PR+, HER2+) - Signed by [name], MD on 07/28/2021 Histologic grading system: 3 grade system    07/29/2021 Imaging   CT chest abdomen pelvis with contrast for staging for inflammatory breast cancer showed diffuse marrow heterogeneity with scattered ill-defined sclerotic lesions and underlying  extensive thoracolumbar spondylosis.  Based on today's bone scan early osseous metastatic disease cannot be excluded.  No other evidence of metastatic disease in the chest abdomen or pelvis.  Known inflammatory left breast cancer with associated asymmetric left axillary lymph nodes.  Bone scan also confirmed heterogeneous osseous uptake throughout the thoracolumbar spine and multiple ribs suspicious for metastatic disease.  Consider further evaluation with PET/CT or MRI of the thoracolumbar spine.   08/01/2021 -  Chemotherapy   Patient is on Treatment Plan : BREAST  Docetaxel + Trastuzumab + Pertuzumab  (TCHP) q21d        This is a very pleasant 76 year old female patient with multiple medical comorbidities including hypertension, obesity, paroxysmal atrial fibrillation, grade 2 peripheral neuropathy at baseline, diabetes with newly diagnosed left breast inflammatory breast cancer with axillary lymph node involvement, received first cycle of chemotherapy with docetaxel/herceptin/perjeta since PET was pending. PET showed metastatic disease and she is here for follow before C2D1. We have had several phone conversations with her in the past few weeks.  Today she has been doing well.  She does not have any diarrhea at this time.  She has lost about 10 pounds overall.  She gained a few pounds back after losing almost 15 pounds according to her.  She denies any bone pains.  She has noticed some change in the left breast, it looks less erythematous.  No change in urinary habits.  She overall feels ready to proceed with chemotherapy as planned today.  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   Dyspnea    Dysrhythmia    Endometrial polyp 04/2000   Fall 01/2018   nose fracture    Heart murmur    Hemorrhoids    History of kidney stones    Hypertension    Impaired glucose tolerance    Obesity    Osteopenia 05/2007   Paroxysmal atrial fibrillation ([facility])    Peripheral neuropathy    PMB (postmenopausal bleeding) 04/2000   Pneumonia    Pre-diabetes    SAB (spontaneous abortion) 1981   Sleep apnea    CPAP   Stroke ([place])    no lasting weakness or paralysis, does have some short term memory loss   SVD (spontaneous vaginal delivery) 04/1964, 05/1978   x3   TIA (transient ischemic attack)     SURGICAL HISTORY: Past Surgical History:  Procedure Laterality Date   BREAST BIOPSY Right 06/2020   x2   CATARACT EXTRACTION Right 12/07/2018   CERVICAL [place] SURGERY  04/1991   CESAREAN SECTION  08/1981   with BTL   COLONOSCOPY N/A 03/19/2017   Procedure: COLONOSCOPY;  Surgeon: Doran Stabler, MD;  Location: [facility];  Service: Gastroenterology;  Laterality: N/A;   DILATION AND CURETTAGE OF UTERUS     HYSTEROSCOPY  5/02, 01/2011   JOINT REPLACEMENT     LITHOTRIPSY  08/30/2014   with Dr. 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   Patient is a homemaker.   Education- College   Right handed.   Caffeine- one coke cola          Social Determinants of Health   Financial Resource Strain: Not on file  Food Insecurity: Not on file  Transportation Needs: Not on file  Physical Activity: Not on file  Stress: Not on file  Social Connections: Not on file  Intimate Partner Violence: Not At Risk   Fear of Current or Ex-Partner: No   Emotionally Abused: No   Physically Abused: No   Sexually Abused: No    FAMILY HISTORY:  Family History  Problem Relation Age of Onset   Heart disease Father    Colon cancer Mother    Heart disease Mother    Breast cancer Maternal Aunt    Colon cancer Maternal Aunt    Colon cancer Maternal Uncle    Ehlers-Danlos syndrome Other        neice-being tested 4/15   Stomach cancer Neg Hx    Pancreatic cancer Neg Hx    Endometrial cancer Neg Hx    Ovarian cancer Neg Hx     ALLERGIES:  is allergic to bactrim [sulfamethoxazole-trimethoprim], ciprofloxacin, hydroxyzine hcl, keflet [cephalexin], lipitor [atorvastatin], lisinopril, spironolactone, vistaril [hydroxyzine hcl], erythromycin, latex, penicillins, tape, and tetracycline.  MEDICATIONS:  Current Outpatient Medications  Medication Sig Dispense Refill   acetaminophen (TYLENOL) 500 MG tablet Take 1,000 mg by mouth every 6 (six) hours as needed for moderate pain or headache.      albuterol (PROAIR HFA) 108 (90 Base) MCG/ACT inhaler INHALE TWO PUFFS BY MOUTH EVERY 6 HOURS AS NEEDED FOR WHEEZING AND SHORTNESS OF BREATH 18 g 12   ALPRAZolam (XANAX) 0.25 MG tablet Take 0.25 mg by mouth daily as needed (peripheral neuropathy).      chlorthalidone (HYGROTON) 25 MG tablet TAKE ONE TABLET BY MOUTH DAILY. 90 tablet 3   cholecalciferol (VITAMIN D) 1000 UNITS tablet Take 1,000 Units by mouth every morning.      dexamethasone (DECADRON) 4 MG tablet Take 2 tablets (8 mg total) by mouth 2 (two) times daily. Start the day before Taxotere. Then take daily x 3 days after chemotherapy. 30 tablet 1   guaiFENesin (MUCINEX) 600 MG 12 hr tablet Take 600 mg by mouth daily as needed for cough.     lidocaine-prilocaine (EMLA) cream Apply to affected area once 30 g 3   losartan (COZAAR) 100 MG tablet TAKE ONE TABLET BY MOUTH DAILY. 90 tablet 3   metoprolol tartrate (LOPRESSOR) 25 MG tablet TAKE ONE TABLET BY MOUTH TWICE DAILY, MAY TAKE ONE EXTRA TABLET AS NEEDED FOR PALPITATIONS 75 tablet 3   Multiple Vitamins-Minerals (MULTIVITAMIN WITH MINERALS) tablet Take 1 tablet by mouth daily. Woman's 50 +     ondansetron (ZOFRAN) 8 MG tablet Take 1 tablet (8 mg total) by mouth 2 (two) times daily as needed (Nausea or vomiting). Start on the third day after chemotherapy. 30 tablet 1   Polyethyl Glycol-Propyl Glycol (SYSTANE OP) Place 1 drop into both eyes daily as needed (dry eyes).      potassium chloride SA (KLOR-CON) 20 MEQ tablet TAKE ONE TABLET BY MOUTH DAILY. 90 tablet 3   prochlorperazine (COMPAZINE) 10 MG tablet Take 1 tablet (10 mg total) by mouth every 6 (six) hours as needed (Nausea or vomiting). 30 tablet 1   sodium chloride (OCEAN) 0.65 % SOLN nasal spray Place 1 spray into both nostrils as needed for congestion.     No current facility-administered medications for this visit.   Facility-Administered Medications Ordered in Other Visits  Medication Dose Route Frequency Provider Last Rate Last Admin   acetaminophen (TYLENOL) tablet 650 mg  650 mg Oral Once Zlatan Hornback, Arletha Pili, MD       dexamethasone (DECADRON) 10 mg in sodium chloride 0.9 % 50 mL IVPB  10 mg Intravenous Once Momoka Stringfield, MD       diphenhydrAMINE (BENADRYL) capsule 50 mg  50 mg  Oral Once Wilbert Hayashi, Arletha Pili, MD       DOCEtaxel (TAXOTERE) 90 mg in sodium chloride 0.9 % 250 mL chemo infusion  40  mg/m2 (Treatment Plan Recorded) Intravenous Once Gaby Harney, Arletha Pili, MD       fosaprepitant (EMEND) 150 mg in sodium chloride 0.9 % 145 mL IVPB  150 mg Intravenous Once Jasaun Carn, MD       heparin lock flush 100 unit/mL  500 Units Intracatheter Once PRN Tamesha Ellerbrock, Arletha Pili, MD       palonosetron (ALOXI) injection 0.25 mg  0.25 mg Intravenous Once Nole Robey, Arletha Pili, MD       pertuzumab (PERJETA) 420 mg in sodium chloride 0.9 % 250 mL chemo infusion  420 mg Intravenous Once Jamerica Snavely, MD       sodium chloride flush (NS) 0.9 % injection 10 mL  10 mL Intracatheter PRN Janesa Dockery, Arletha Pili, MD       trastuzumab-anns (KANJINTI) 693 mg in sodium chloride 0.9 % 250 mL chemo infusion  6 mg/kg (Treatment Plan Recorded) Intravenous Once Candice Tobey, Arletha Pili, MD        REVIEW OF SYSTEMS:   Constitutional: Denies fevers, chills or abnormal night sweats Eyes: Denies blurriness of vision, double vision or watery eyes Ears, nose, mouth, throat, and face: Denies mucositis or sore throat Respiratory: Denies cough, dyspnea or wheezes Cardiovascular: Denies palpitation, chest discomfort or lower extremity swelling Gastrointestinal:  Denies nausea, heartburn or change in bowel habits Skin: Denies abnormal skin rashes Lymphatics: Denies new lymphadenopathy or easy bruising Neurological:Denies numbness, tingling or new weaknesses Behavioral/Psych: Mood is stable, no new changes  Breast: Sudden onset redness and swelling of the left breast All other systems were reviewed with the patient and are negative.  PHYSICAL EXAMINATION: ECOG PERFORMANCE STATUS: 0 - Asymptomatic  Vitals:   08/21/21 1022  BP: (!) 150/92  Pulse: 79  Temp: 97.7 F (36.5 C)  SpO2: 96%     Filed Weights   08/21/21 1022  Weight: 250 lb 6.4 oz (113.6 kg)   Physical Exam Constitutional:      Appearance: Normal appearance.   Cardiovascular:     Rate and Rhythm: Normal rate and regular rhythm.     Pulses: Normal pulses.     Heart sounds: Normal heart sounds.  Chest:     Comments: Left breast appears less erythematous on exam today Musculoskeletal:        General: No swelling.     Cervical back: Normal range of motion and neck supple. No rigidity.  Lymphadenopathy:     Cervical: No cervical adenopathy.  Neurological:     Mental Status: She is alert.     LABORATORY DATA:  I have reviewed the data as listed Lab Results  Component Value Date   WBC 9.8 08/21/2021   HGB 13.1 08/21/2021   HCT 39.8 08/21/2021   MCV 86.1 08/21/2021   PLT 216 08/21/2021   Lab Results  Component Value Date   NA 139 08/21/2021   K 3.5 08/21/2021   CL 103 08/21/2021   CO2 30 08/21/2021    RADIOGRAPHIC STUDIES: I have personally reviewed the radiological reports and agreed with the findings in the report.  ASSESSMENT AND PLAN:  Inflammatory breast cancer, left ([place]) This is a pleasant 76 year old female patient with multiple medical comorbidities including diabetes, hypertension, obesity, arthritis, idiopathic peripheral neuropathy of stroke, obstructive sleep apnea, paroxysmal atrial fibrillation not on any anticoagulation who is now diagnosed with inflammatory breast cancer of the left side, grade 3 HER2 amplified on neoadjuvant docetaxel, Herceptin and Perjeta, carboplatin held for first cycle since there was some concern for metastatic breast cancer. PET scan showed hypermetabolic activity in the axial  and appendicular skeleton consistent with metastatic disease. I have changed her treatment plan to docetaxel and Herceptin and Perjeta for the rest of 5 cycles.  (Carboplatin was anyway omitted for the first cycle given suspicion for metastatic disease close ) I have also dose reduce the docetaxel further to 40 mg per metered square for better tolerance. She had grade 1 diarrhea after chemotherapy, Imodium worked well.   Diarrhea has completely resolved. Appetite has changed since chemotherapy, she now has a craving for egg salad, spaghetti and a few other things. We have strongly encouraged her to give [place] a call if she cannot hydrate or eat well.  She expressed understanding of the recommendations. She should proceed with cycle 2 of docetaxel and Herceptin and Perjeta today.  CBC and CMP reviewed and satisfactory. Husband wanted to record our conversation today to make sure the daughter understands.  We have clearly discussed that this is metastatic breast cancer, intent of treatment is palliative.  If she cannot tolerate treatment at any point or becomes progressively weak, we may recommend palliative care and hospice.  I have also recommended MRI brain staging for further information regarding the stage of disease.  They expressed understanding.  Return to clinic in 1 week for toxicity check     Total time spent: 40 minutes including history, physical exam, review of records, counseling and coordination of care All questions were answered. The patient knows to call the clinic with any problems, questions or concerns.    [name], MD 08/21/21

## 2021-08-21 NOTE — Patient Instructions (Signed)
[facility]  Discharge Instructions: Thank you for choosing [facility] to provide your oncology and hematology care.   If you have a lab appointment with the [facility], please go directly to the [facility] and check in at the registration area.   Wear comfortable clothing and clothing appropriate for easy access to any Portacath or PICC line.   We strive to give you quality time with your provider. You may need to reschedule your appointment if you arrive late (15 or more minutes).  Arriving late affects you and other patients whose appointments are after yours.  Also, if you miss three or more appointments without notifying the office, you may be dismissed from the clinic at the provider's discretion.      For prescription refill requests, have your pharmacy contact our office and allow 72 hours for refills to be completed.    Today you received the following chemotherapy and/or immunotherapy agents: Kanjinti/Perjeta/Docetaxel      To help prevent nausea and vomiting after your treatment, we encourage you to take your nausea medication as directed.  BELOW ARE SYMPTOMS THAT SHOULD BE REPORTED IMMEDIATELY: *FEVER GREATER THAN 100.4 F (38 C) OR HIGHER *CHILLS OR SWEATING *NAUSEA AND VOMITING THAT IS NOT CONTROLLED WITH YOUR NAUSEA MEDICATION *UNUSUAL SHORTNESS OF BREATH *UNUSUAL BRUISING OR BLEEDING *URINARY PROBLEMS (pain or burning when urinating, or frequent urination) *BOWEL PROBLEMS (unusual diarrhea, constipation, pain near the anus) TENDERNESS IN MOUTH AND THROAT WITH OR WITHOUT PRESENCE OF ULCERS (sore throat, sores in mouth, or a toothache) UNUSUAL RASH, SWELLING OR PAIN  UNUSUAL VAGINAL DISCHARGE OR ITCHING   Items with * indicate a potential emergency and should be followed up as soon as possible or go to the Emergency Department if any problems should occur.  Please show the CHEMOTHERAPY ALERT CARD or IMMUNOTHERAPY ALERT  CARD at check-in to the Emergency Department and triage nurse.  Should you have questions after your visit or need to cancel or reschedule your appointment, please contact [facility]  Dept: [phone]  and follow the prompts.  Office hours are 8:00 a.m. to 4:30 p.m. Monday - Friday. Please note that voicemails left after 4:00 p.m. may not be returned until the following business day.  We are closed weekends and major holidays. You have access to a nurse at all times for urgent questions. Please call the main number to the clinic Dept: [phone] and follow the prompts.   For any non-urgent questions, you may also contact your provider using MyChart. We now offer e-Visits for anyone 63 and older to request care online for non-urgent symptoms. For details visit [email or web address].   Also download the MyChart app! Go to the app store, search "MyChart", open the app, select [facility], and log in with your MyChart username and password.  Due to Covid, a mask is required upon entering the hospital/clinic. If you do not have a mask, one will be given to you upon arrival. For doctor visits, patients may have 1 support person aged 77 or older with them. For treatment visits, patients cannot have anyone with them due to current Covid guidelines and our immunocompromised population.

## 2021-08-21 NOTE — Assessment & Plan Note (Signed)
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 PET scan showed hypermetabolic activity in the axial and appendicular skeleton consistent with metastatic disease. I have changed her treatment plan to docetaxel and Herceptin and Perjeta for the rest of 5 cycles.  (Carboplatin was anyway omitted for the first cycle given suspicion for metastatic disease close ) I have also dose reduce the docetaxel further to 40 mg per metered square for better tolerance. She had grade 1 diarrhea after chemotherapy, Imodium worked well.  Diarrhea has completely resolved. Appetite has changed since chemotherapy, she now has a craving for egg salad, spaghetti and a few other things. We have strongly encouraged her to give [place] a call if she cannot hydrate or eat well.  She expressed understanding of the recommendations. She should proceed with cycle 2 of docetaxel and Herceptin and Perjeta today.  CBC and CMP reviewed and satisfactory. Husband wanted to record our conversation today to make sure the daughter understands.  We have clearly discussed that this is metastatic breast cancer, intent of treatment is palliative.  If she cannot tolerate treatment at any point or becomes progressively weak, we may recommend palliative care and hospice.  I have also recommended MRI brain staging for further information regarding the stage of disease.  They expressed understanding.  Return to clinic in 1 week for toxicity check

## 2021-08-23 ENCOUNTER — Inpatient Hospital Stay: Payer: BC Managed Care – PPO

## 2021-08-23 VITALS — BP 139/83 | HR 77 | Temp 98.1°F | Resp 16

## 2021-08-23 DIAGNOSIS — Z5112 Encounter for antineoplastic immunotherapy: Secondary | ICD-10-CM | POA: Diagnosis not present

## 2021-08-23 DIAGNOSIS — C50912 Malignant neoplasm of unspecified site of left female breast: Secondary | ICD-10-CM

## 2021-08-23 MED ORDER — PEGFILGRASTIM-CBQV 6 MG/0.6ML ~~LOC~~ SOSY
6.0000 mg | PREFILLED_SYRINGE | Freq: Once | SUBCUTANEOUS | Status: AC
  Administered 2021-08-23: 6 mg via SUBCUTANEOUS
  Filled 2021-08-23: qty 0.6

## 2021-08-23 NOTE — Patient Instructions (Signed)

## 2021-08-29 ENCOUNTER — Inpatient Hospital Stay (HOSPITAL_BASED_OUTPATIENT_CLINIC_OR_DEPARTMENT_OTHER): Payer: BC Managed Care – PPO | Admitting: Physician Assistant

## 2021-08-29 ENCOUNTER — Other Ambulatory Visit: Payer: Self-pay

## 2021-08-29 VITALS — BP 139/83 | HR 81 | Temp 97.5°F | Resp 18 | Ht 59.5 in | Wt 250.8 lb

## 2021-08-29 DIAGNOSIS — C50912 Malignant neoplasm of unspecified site of left female breast: Secondary | ICD-10-CM

## 2021-08-29 DIAGNOSIS — Z5112 Encounter for antineoplastic immunotherapy: Secondary | ICD-10-CM | POA: Diagnosis not present

## 2021-08-29 DIAGNOSIS — R630 Anorexia: Secondary | ICD-10-CM | POA: Diagnosis not present

## 2021-08-29 MED ORDER — NYSTATIN 100000 UNIT/GM EX POWD: 1.0000 "application " | Freq: Three times a day (TID) | CUTANEOUS | 3 refills | Status: AC

## 2021-08-29 NOTE — Progress Notes (Unsigned)
[place]  PROGRESS NOTE  Patient Care Team: Leeanne Rio, MD as PCP - General (Family Medicine) Harl Bowie, Alphonse Guild, MD as PCP - Cardiology (Cardiology) Patient, No Pcp Per (Inactive) (General Practice) Mauro Kaufmann, RN as Oncology Nurse Navigator Rockwell Germany, RN as Oncology Nurse Navigator [name], MD as Consulting Physician (Hematology and Oncology)  CHIEF COMPLAINTS/PURPOSE OF CONSULTATION:  Metastatic breast cancer  SUMMARY OF ONCOLOGIC HISTORY: Oncology History  Inflammatory breast cancer, left ([place])  06/23/2021 Mammogram   Suspicious mass in the left breast at 2 o'clock measuring 1.8 cm. There is associated diffuse left breast skin thickening and increased trabeculation concerning for inflammatory breast cancer. There are at least 5 abnormal left axillary lymph nodes. 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   07/11/2021 Pathology Results   Left needle core biopsy from 2 o'clock position showed invasive mammary carcinoma, grade 3, lymph node biopsy showed metastatic carcinoma associated with minimal residual lymphoid tissue.  Prognostic markers could not be reported due to problems with tissue processing.  She will be undergoing repeat biopsy.   07/22/2021 Initial Diagnosis   Inflammatory breast cancer, left ([place])   07/28/2021 Cancer Staging   Staging form: Breast, AJCC 8th Edition - Clinical: Stage IB (cT2, cN1, cM0, G3, ER+, PR+, HER2+) - Signed by [name], MD on 07/28/2021 Histologic grading system: 3 grade system   07/29/2021 Imaging   CT chest abdomen pelvis with contrast for staging for inflammatory breast cancer showed diffuse marrow heterogeneity with scattered ill-defined sclerotic lesions and underlying extensive thoracolumbar spondylosis.  Based on today's bone scan early osseous metastatic disease cannot be excluded.  No other evidence of metastatic disease in the chest abdomen or pelvis.  Known  inflammatory left breast cancer with associated asymmetric left axillary lymph nodes.  Bone scan also confirmed heterogeneous osseous uptake throughout the thoracolumbar spine and multiple ribs suspicious for metastatic disease.  Consider further evaluation with PET/CT or MRI of the thoracolumbar spine.   08/01/2021 -  Chemotherapy   Patient is on Treatment Plan : BREAST  Docetaxel + Trastuzumab + Pertuzumab  (TCHP) q21d        HISTORY OF PRESENTING ILLNESS:  Nicole Cabrera returns for a follow up after undergoing cycle 2 of taxotere, trastuzumab and pertuzumab. She reports that her energy levels are fairly stable. She continues to complete her ADLs on her own. She continues to have appetite loss with approximately 10 lb weight loss since 08/05/2021. She denies nausea, vomiting or abdominal pain. She reports diarrhea following chemotherapy, up to 3 episodes per day. She takes imodium as needed. She denies easy bruising or signs of active bleeding. She feels improvement of rash involving her left breast.  She denies fevers, chills, sweats, shortness of breath, chest pain or cough. 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   Dyspnea    Dysrhythmia    Endometrial polyp 04/2000   Fall 01/2018   nose fracture    Heart murmur    Hemorrhoids  History of kidney stones    Hypertension    Impaired glucose tolerance    Obesity    Osteopenia 05/2007   Paroxysmal atrial fibrillation (HCC)    Peripheral neuropathy    PMB (postmenopausal bleeding) 04/2000   Pneumonia    Pre-diabetes    SAB (spontaneous abortion) 1981   Sleep apnea     CPAP   Stroke ([place])    no lasting weakness or paralysis, does have some short term memory loss   SVD (spontaneous vaginal delivery) 04/1964, 05/1978   x3   TIA (transient ischemic attack)     SURGICAL HISTORY: Past Surgical History:  Procedure Laterality Date   BREAST BIOPSY Right 06/2020   x2   CATARACT EXTRACTION Right 12/07/2018   CERVICAL [place] SURGERY  04/1991   CESAREAN SECTION  08/1981   with BTL   COLONOSCOPY N/A 03/19/2017   Procedure: COLONOSCOPY;  Surgeon: Doran Stabler, MD;  Location: [place];  Service: Gastroenterology;  Laterality: N/A;   DILATION AND CURETTAGE OF UTERUS     HYSTEROSCOPY  5/02, 01/2011   JOINT REPLACEMENT     LITHOTRIPSY  08/30/2014   with Dr. 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   Patient is a homemaker.   Education- College   Right handed.   Caffeine- one coke cola          Social Determinants of Health   Financial Resource Strain: Not  on file  Food Insecurity: Not on file  Transportation Needs: Not on file  Physical Activity: Not on file  Stress: Not on file  Social Connections: Not on file  Intimate Partner Violence: Not At Risk (07/31/2021)   Humiliation, Afraid, Rape, and Kick questionnaire    Fear of Current or Ex-Partner: No    Emotionally Abused: No    Physically Abused: No    Sexually Abused: No    FAMILY HISTORY: Family History  Problem Relation Age of Onset   Heart disease Father    Colon cancer Mother    Heart disease Mother    Breast cancer Maternal Aunt    Colon cancer Maternal Aunt    Colon cancer Maternal Uncle    Ehlers-Danlos syndrome Other        neice-being tested 4/15   Stomach cancer Neg Hx  Pancreatic cancer Neg Hx    Endometrial cancer Neg Hx    Ovarian cancer Neg Hx     ALLERGIES:  is allergic to bactrim [sulfamethoxazole-trimethoprim], ciprofloxacin, hydroxyzine hcl, keflet [cephalexin], lipitor [atorvastatin], lisinopril, spironolactone, vistaril [hydroxyzine hcl], erythromycin, latex, penicillins, tape, and tetracycline.  MEDICATIONS:  Current Outpatient Medications  Medication Sig Dispense Refill   acetaminophen (TYLENOL) 500 MG tablet Take 1,000 mg by mouth every 6 (six) hours as needed for moderate pain or headache.      albuterol (PROAIR HFA) 108 (90 Base) MCG/ACT inhaler INHALE TWO PUFFS BY MOUTH EVERY 6 HOURS AS NEEDED FOR WHEEZING AND SHORTNESS OF BREATH 18 g 12   ALPRAZolam (XANAX) 0.25 MG tablet Take 0.25 mg by mouth daily as needed (peripheral neuropathy).     chlorthalidone (HYGROTON) 25 MG tablet TAKE ONE TABLET BY MOUTH DAILY. 90 tablet 3   cholecalciferol (VITAMIN D) 1000 UNITS tablet Take 1,000 Units by mouth every morning.      dexamethasone (DECADRON) 4 MG tablet Take 2 tablets (8 mg total) by mouth 2 (two) times daily. Start the day before Taxotere. Then take daily x 3 days after chemotherapy. 30 tablet 1   guaiFENesin (MUCINEX) 600 MG 12 hr tablet Take 600 mg  by mouth daily as needed for cough.     hydrocortisone cream 1 % Apply 1 application  topically daily. Mixed with clotrimazole cream. Washing in nizoral shampoo Applying 50/50 mix to lower abdomen     lidocaine-prilocaine (EMLA) cream Apply to affected area once 30 g 3   losartan (COZAAR) 100 MG tablet TAKE ONE TABLET BY MOUTH DAILY. 90 tablet 3   metoprolol tartrate (LOPRESSOR) 25 MG tablet TAKE ONE TABLET BY MOUTH TWICE DAILY, MAY TAKE ONE EXTRA TABLET AS NEEDED FOR PALPITATIONS 75 tablet 3   Multiple Vitamins-Minerals (MULTIVITAMIN WITH MINERALS) tablet Take 1 tablet by mouth daily. Woman's 50 +     ondansetron (ZOFRAN) 8 MG tablet Take 1 tablet (8 mg total) by mouth 2 (two) times daily as needed (Nausea or vomiting). Start on the third day after chemotherapy. 30 tablet 1   Polyethyl Glycol-Propyl Glycol (SYSTANE OP) Place 1 drop into both eyes daily as needed (dry eyes).      potassium chloride SA (KLOR-CON) 20 MEQ tablet TAKE ONE TABLET BY MOUTH DAILY. 90 tablet 3   prochlorperazine (COMPAZINE) 10 MG tablet Take 1 tablet (10 mg total) by mouth every 6 (six) hours as needed (Nausea or vomiting). 30 tablet 1   sodium chloride (OCEAN) 0.65 % SOLN nasal spray Place 1 spray into both nostrils as needed for congestion.     No current facility-administered medications for this visit.    REVIEW OF SYSTEMS:   Constitutional: Denies fevers, chills or abnormal night sweats Eyes: Denies blurriness of vision, double vision or watery eyes Ears, nose, mouth, throat, and face: Denies mucositis or sore throat Respiratory: Denies cough, dyspnea or wheezes Cardiovascular: Denies palpitation, chest discomfort or lower extremity swelling Gastrointestinal:  Denies nausea, heartburn or change in bowel habits Skin: Denies abnormal skin rashes Lymphatics: Denies new lymphadenopathy or easy bruising Neurological:Denies numbness, tingling or new weaknesses Behavioral/Psych: Mood is stable, no new changes   Breast: Improved redness and swelling of the left breast All other systems were reviewed with the patient and are negative.  PHYSICAL EXAMINATION: ECOG PERFORMANCE STATUS: 0 - Asymptomatic  Vitals:   08/29/21 0943  BP: 139/83  Pulse: 81  Resp: 18  Temp: (!) 97.5 F (36.4 C)  SpO2: 91%  Filed Weights   08/29/21 0943  Weight: 250 lb 12.8 oz (113.8 kg)   Physical Exam Constitutional:      Appearance: Normal appearance.  Cardiovascular:     Rate and Rhythm: Normal rate and regular rhythm.     Pulses: Normal pulses.     Heart sounds: Normal heart sounds.  Musculoskeletal:        General: No swelling.     Cervical back: Normal range of motion and neck supple. No rigidity.  Lymphadenopathy:     Cervical: No cervical adenopathy.  Skin:    Comments: Fungal rash noted lower abdominal area.   Neurological:     Mental Status: She is alert.      LABORATORY DATA:  I have reviewed the data as listed Lab Results  Component Value Date   WBC 9.8 08/21/2021   HGB 13.1 08/21/2021   HCT 39.8 08/21/2021   MCV 86.1 08/21/2021   PLT 216 08/21/2021   Lab Results  Component Value Date   NA 139 08/21/2021   K 3.5 08/21/2021   CL 103 08/21/2021   CO2 30 08/21/2021    RADIOGRAPHIC STUDIES: I have personally reviewed the radiological reports and agreed with the findings in the report.  ASSESSMENT AND PLAN: Nicole Cabrera is a 76 y.o. female returns for a follow up for inflammatory breast cancer.   #Metastatic left sided inflammatory breast cancer, grade 3, HER2 positive: --Baseline echo from 06/17/2021 with EF 65-70%, next one due in June 2023.  --Started systemic chemotherapy with taxotere, trastuzumab and pertuzumab on 08/01/2021.  --PET scan from 08/13/2021 showed widespread metastatic disease involving the bone. --MRI brain scheduled for 09/09/2021 --Patient is tolerating treatment well without any significant limitations. Recommend to proceed with Cycle 3 scheduled for  09/11/2021.   #Appetite loss/weight loss:  --Sent referral to nutrition to help improve PO intake  #Diarrhea:  --Recommend to continue to take imodium.   #Fungal rash in lower abdomen: --Sent Nystatin powder to apply three times a day. Advised keep area dry and monitor for worsening symptoms.   Follow up:  --RTC on 09/11/2021 for labs, f/u visit with Dr. Chryl Heck and Cycle 3 of chemotherapy --Echo ordered  All questions were answered. The patient knows to call the clinic with any problems, questions or concerns.  I have spent a total of 30 minutes minutes of face-to-face and non-face-to-face time, preparing to see the patient,  performing a medically appropriate examination, counseling and educating the patient,  referring and communicating with other health care professionals, documenting clinical information in the electronic health record, and care coordination.   Dede Query PA-C Dept of Hematology and [place] at [facility] Phone: [phone]

## 2021-08-31 ENCOUNTER — Encounter: Payer: Self-pay | Admitting: Hematology and Oncology

## 2021-08-31 ENCOUNTER — Encounter (HOSPITAL_COMMUNITY): Payer: Self-pay

## 2021-09-08 ENCOUNTER — Telehealth: Payer: Self-pay | Admitting: Gynecologic Oncology

## 2021-09-08 NOTE — Telephone Encounter (Signed)
Called the patient to attempt to do a phone visit a day early since I will be late in surgery tomorrow.  Her husband answered the phone as she was asleep.  Discussed with him that I either would be calling much later in the evening or would call on Wednesday.  He voiced understanding and will pass the message along to his wife.  Jeral Pinch MD Gynecologic Oncology

## 2021-09-09 ENCOUNTER — Ambulatory Visit (HOSPITAL_COMMUNITY)
Admission: RE | Admit: 2021-09-09 | Discharge: 2021-09-09 | Disposition: A | Discharge status: Home / Self Care | Payer: BC Managed Care – PPO | Source: Ambulatory Visit | Attending: Hematology and Oncology | Admitting: Hematology and Oncology

## 2021-09-09 ENCOUNTER — Telehealth: Payer: Self-pay

## 2021-09-09 ENCOUNTER — Encounter: Payer: Self-pay | Admitting: Gynecologic Oncology

## 2021-09-09 ENCOUNTER — Inpatient Hospital Stay (HOSPITAL_BASED_OUTPATIENT_CLINIC_OR_DEPARTMENT_OTHER): Payer: BC Managed Care – PPO | Admitting: Gynecologic Oncology

## 2021-09-09 DIAGNOSIS — C50912 Malignant neoplasm of unspecified site of left female breast: Secondary | ICD-10-CM | POA: Diagnosis present

## 2021-09-09 DIAGNOSIS — N8501 Benign endometrial hyperplasia: Secondary | ICD-10-CM

## 2021-09-09 MED ORDER — GADOBUTROL 1 MMOL/ML IV SOLN
10.0000 mL | Freq: Once | INTRAVENOUS | Status: AC | PRN
  Administered 2021-09-09: 10 mL via INTRAVENOUS

## 2021-09-09 NOTE — Progress Notes (Signed)
Gynecologic Oncology Telehealth Note: Gyn-Onc  I connected with [name] on 09/09/21 at  4:40 PM EDT by telephone and verified that I am speaking with the correct person using two identifiers.  I discussed the limitations, risks, security and privacy concerns of performing an evaluation and management service by telemedicine and the availability of in-person appointments. I also discussed with the patient that there may be a patient responsible charge related to this service. The patient expressed understanding and agreed to proceed.  Other persons participating in the visit and their role in the encounter: None.  Patient's location: Home Provider's location: [facility]  Reason for Visit: Follow-up after recent IUD removal  Treatment History: Oncology History  Inflammatory breast cancer, left ([place])  06/23/2021 Mammogram   Suspicious mass in the left breast at 2 o'clock measuring 1.8 cm. There is associated diffuse left breast skin thickening and increased trabeculation concerning for inflammatory breast cancer. There are at least 5 abnormal left axillary lymph nodes. 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   07/11/2021 Pathology Results   Left needle core biopsy from 2 o'clock position showed invasive mammary carcinoma, grade 3, lymph node biopsy showed metastatic carcinoma associated with minimal residual lymphoid tissue.  Prognostic markers could not be reported due to problems with tissue processing.  She will be undergoing repeat biopsy.   07/22/2021 Initial Diagnosis   Inflammatory breast cancer, left ([name])   07/28/2021 Cancer Staging   Staging form: Breast, AJCC 8th Edition - Clinical: Stage IB (cT2, cN1, cM0, G3, ER+, PR+, HER2+) - Signed by [name], MD on 07/28/2021 Histologic grading system: 3 grade system   07/29/2021 Imaging   CT chest abdomen pelvis with contrast for staging for inflammatory breast cancer showed diffuse marrow heterogeneity  with scattered ill-defined sclerotic lesions and underlying extensive thoracolumbar spondylosis.  Based on today's bone scan early osseous metastatic disease cannot be excluded.  No other evidence of metastatic disease in the chest abdomen or pelvis.  Known inflammatory left breast cancer with associated asymmetric left axillary lymph nodes.  Bone scan also confirmed heterogeneous osseous uptake throughout the thoracolumbar spine and multiple ribs suspicious for metastatic disease.  Consider further evaluation with PET/CT or MRI of the thoracolumbar spine.   08/01/2021 -  Chemotherapy   Patient is on Treatment Plan : BREAST  Docetaxel + Trastuzumab + Pertuzumab  (TCHP) q21d       08/30/19: EMB in the OR given scant tissue obtained at office visit and difficulty with exam due to body habitus. 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 Patient was prescribed Megace. Took this for 9 days but given side effects, discontinued. 11/13/19: Mirena IUD placed in clinic 12/05/19: Pelvic ultrasound confirmed expected location of IUD in endometrium of upper uterine segment 02/27/20: seen in the office with Dr. [name]. still having some spotting.  Had heavy bleeding in august, Eliquis was temporarily stopped. EMB declined. 08/29/20: see in office with Dr. [name]. Continued spotting, no heavy bleeding. Declined EMB again.  10/25/20: EMB showed polypoid fragments of inactive endometrium with hormone effect, no hyperplasia and malignancy.   Interval History: Patient reports overall doing well, is tired as she had to drive to [facility] early this morning for MRI.  She is managing on chemotherapy.  She denies any vaginal bleeding or pelvic pain since IUD was removed.  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   Dyspnea    Dysrhythmia    Endometrial polyp 04/2000   Fall 01/2018   nose fracture    Heart murmur    Hemorrhoids    History of kidney stones    Hypertension    Impaired glucose tolerance    Obesity    Osteopenia 05/2007   Paroxysmal atrial fibrillation ([facility])    Peripheral neuropathy    PMB (postmenopausal bleeding) 04/2000   Pneumonia    Pre-diabetes    SAB (spontaneous abortion) 1981   Sleep apnea    CPAP   Stroke ([place])    no lasting weakness or paralysis, does have some short term memory loss   SVD (spontaneous vaginal delivery) 04/1964, 05/1978   x3   TIA (transient ischemic attack)     Past Surgical History:  Procedure Laterality Date   BREAST BIOPSY Right 06/2020   x2   CATARACT EXTRACTION Right 12/07/2018   CERVICAL [place] SURGERY  04/1991   CESAREAN SECTION  08/1981   with BTL   COLONOSCOPY N/A 03/19/2017   Procedure: COLONOSCOPY;  Surgeon: Doran Stabler, MD;  Location: [facility];  Service: Gastroenterology;  Laterality: N/A;   DILATION AND CURETTAGE OF UTERUS     HYSTEROSCOPY  5/02, 01/2011   JOINT REPLACEMENT     LITHOTRIPSY  08/30/2014   with Dr. Jeffie Pollock at [facility] Right 07/28/2021   Procedure: INSERTION PORT-A-CATH;  Surgeon: [name], MD;  Location: [facility];  Service: General;  Laterality: Right;   TOTAL KNEE ARTHROPLASTY Right 08/2006   right   TOTAL KNEE ARTHROPLASTY Left 06/2008   left    TUBAL LIGATION     WRIST SURGERY Left 06/2005   left    Family History  Problem Relation Age of Onset   Heart disease Father    Colon cancer Mother    Heart disease Mother    Breast cancer Maternal Aunt    Colon cancer Maternal Aunt    Colon cancer Maternal Uncle    Ehlers-Danlos syndrome Other        neice-being tested 4/15   Stomach cancer Neg Hx    Pancreatic cancer Neg Hx     Endometrial cancer Neg Hx    Ovarian cancer Neg Hx     Social History   Socioeconomic History   Marital status: Married    Spouse name: Nicole Cabrera   Number of children: 3   Years of education: college   Highest education level: Not on file  Occupational History   Occupation: Scientist, 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   Patient is a homemaker.   Education- [employer]  Right handed.   Caffeine- one coke cola          Social Determinants of Health   Financial Resource Strain: Not on file  Food Insecurity: Not on file  Transportation Needs: Not on file  Physical Activity: Not on file  Stress: Not on file  Social Connections: Not on file    Current Medications:  Current Outpatient Medications:    acetaminophen (TYLENOL) 500 MG tablet, Take 1,000 mg by mouth every 6 (six) hours as needed for moderate pain or headache. , Disp: , Rfl:    albuterol (PROAIR HFA) 108 (90 Base) MCG/ACT inhaler, INHALE TWO PUFFS BY MOUTH EVERY 6 HOURS AS NEEDED FOR WHEEZING AND SHORTNESS OF BREATH, Disp: 18 g, Rfl: 12   ALPRAZolam (XANAX) 0.25 MG tablet, Take 0.25 mg by mouth daily as needed (peripheral neuropathy)., Disp: , Rfl:    chlorthalidone (HYGROTON) 25 MG tablet, TAKE ONE TABLET BY MOUTH DAILY., Disp: 90 tablet, Rfl: 3   cholecalciferol (VITAMIN D) 1000 UNITS tablet, Take 1,000 Units by mouth every morning. , Disp: , Rfl:    dexamethasone (DECADRON) 4 MG tablet, Take 2 tablets (8 mg total) by mouth 2 (two) times daily. Start the day before Taxotere. Then take daily x 3 days after  chemotherapy., Disp: 30 tablet, Rfl: 1   guaiFENesin (MUCINEX) 600 MG 12 hr tablet, Take 600 mg by mouth daily as needed for cough., Disp: , Rfl:    hydrocortisone cream 1 %, Apply 1 application  topically daily. Mixed with clotrimazole cream. Washing in nizoral shampoo Applying 50/50 mix to lower abdomen, Disp: , Rfl:    lidocaine-prilocaine (EMLA) cream, Apply to affected area once, Disp: 30 g, Rfl: 3   losartan (COZAAR) 100 MG tablet, TAKE ONE TABLET BY MOUTH DAILY., Disp: 90 tablet, Rfl: 3   metoprolol tartrate (LOPRESSOR) 25 MG tablet, TAKE ONE TABLET BY MOUTH TWICE DAILY, MAY TAKE ONE EXTRA TABLET AS NEEDED FOR PALPITATIONS, Disp: 75 tablet, Rfl: 3   Multiple Vitamins-Minerals (MULTIVITAMIN WITH MINERALS) tablet, Take 1 tablet by mouth daily. Woman's 50 +, Disp: , Rfl:    nystatin (MYCOSTATIN/NYSTOP) powder, Apply 1 application  topically 3 (three) times daily., Disp: 15 g, Rfl: 3   ondansetron (ZOFRAN) 8 MG tablet, Take 1 tablet (8 mg total) by mouth 2 (two) times daily as needed (Nausea or vomiting). Start on the third day after chemotherapy., Disp: 30 tablet, Rfl: 1   Polyethyl Glycol-Propyl Glycol (SYSTANE OP), Place 1 drop into both eyes daily as needed (dry eyes). , Disp: , Rfl:    potassium chloride SA (KLOR-CON) 20 MEQ tablet, TAKE ONE TABLET BY MOUTH DAILY., Disp: 90 tablet, Rfl: 3   prochlorperazine (COMPAZINE) 10 MG tablet, Take 1 tablet (10 mg total) by mouth every 6 (six) hours as needed (Nausea or vomiting)., Disp: 30 tablet, Rfl: 1   sodium chloride (OCEAN) 0.65 % SOLN nasal spray, Place 1 spray into both nostrils as needed for congestion., Disp: , Rfl:   Review of Symptoms: Pertinent positives as per HPI.  Physical Exam: LMP 01/22/2011  Deferred given limitations of phone visit.  Laboratory & Radiologic Studies: None new  Assessment & Plan: Nicole Cabrera is a 76 y.o. woman with simple and complex endometrial hyperplasia now being treated with progestin (IUD) now with  progesterone receptor positive inflammatory breast cancer.  Patient is overall doing well since her Mirena IUD was removed.  She did denied any vaginal bleeding, spotting, or pelvic pain.  IUD had been placed  in the setting of simple and complex endometrial hyperplasia without atypia.  Her last biopsy in August 2022, about a year after her Mirena IUD was placed, showed polypoid fragments of inactive endometrium with hormone effect, no hyperplasia or malignancy.  Given new breast cancer diagnosis, her medical oncologist had requested removal of the progesterone secreting IUD.  She and I discussed that no progesterone therapy would be feasible now.  I suggested that we check in after she has completed her breast cancer treatment.  We can monitor her closely for any redevelopment of bleeding or thickening of her endometrium.  Stressed the importance of calling if she develops any new symptoms like vaginal bleeding.   Patient has had improvement and almost resolution of vaginal bleeding. Reviewed negative biopsy from August. Had originally discussed f/u biopsy in 6 months. The patient is not interested in having another biopsy now (either in clinic or the OR). Reviewed the risk associated with no biopsies moving forward although overall decrease in bleeding is reassuring. Patient was amenable to follow-up phone visit in 4-5 months and will call if something changes before next visit.  I discussed the assessment and treatment plan with the patient. The patient was provided with an opportunity to ask questions and all were answered. The patient agreed with the plan and demonstrated an understanding of the instructions.   The patient was advised to call back or see an in-person evaluation if the symptoms worsen or if the condition fails to improve as anticipated.   8 minutes of total time was spent for this patient encounter, including preparation, phone counseling with the patient and coordination of care, and  documentation of the encounter.   Jeral Pinch, MD  Division of Gynecologic Oncology  Department of Obstetrics and Gynecology  [facility]

## 2021-09-09 NOTE — Telephone Encounter (Signed)
Spoke with Nicole Cabrera this afternoon. Advised patient that Dr. Berline Lopes would like to have a phone visit in 3 months. Appointment scheduled for 12/10/21 at 4pm. Patient is in agreement of appointment date and time. Instructed to call with any needs.

## 2021-09-10 ENCOUNTER — Ambulatory Visit (HOSPITAL_COMMUNITY)
Admission: RE | Admit: 2021-09-10 | Discharge: 2021-09-10 | Disposition: A | Discharge status: Home / Self Care | Payer: BC Managed Care – PPO | Source: Ambulatory Visit | Attending: Physician Assistant | Admitting: Physician Assistant

## 2021-09-10 DIAGNOSIS — Z0189 Encounter for other specified special examinations: Secondary | ICD-10-CM

## 2021-09-10 DIAGNOSIS — Z8673 Personal history of transient ischemic attack (TIA), and cerebral infarction without residual deficits: Secondary | ICD-10-CM | POA: Diagnosis not present

## 2021-09-10 DIAGNOSIS — G4730 Sleep apnea, unspecified: Secondary | ICD-10-CM | POA: Diagnosis not present

## 2021-09-10 DIAGNOSIS — C50912 Malignant neoplasm of unspecified site of left female breast: Secondary | ICD-10-CM | POA: Diagnosis not present

## 2021-09-10 DIAGNOSIS — I3481 Nonrheumatic mitral (valve) annulus calcification: Secondary | ICD-10-CM | POA: Insufficient documentation

## 2021-09-10 DIAGNOSIS — Z01818 Encounter for other preprocedural examination: Secondary | ICD-10-CM | POA: Diagnosis not present

## 2021-09-10 DIAGNOSIS — I10 Essential (primary) hypertension: Secondary | ICD-10-CM | POA: Insufficient documentation

## 2021-09-10 DIAGNOSIS — I4891 Unspecified atrial fibrillation: Secondary | ICD-10-CM | POA: Diagnosis not present

## 2021-09-10 LAB — ECHOCARDIOGRAM COMPLETE
Area-P 1/2: 3.61 cm2
MV VTI: 2.98 cm2
S' Lateral: 2.5 cm

## 2021-09-10 MED FILL — Fosaprepitant Dimeglumine For IV Infusion 150 MG (Base Eq): INTRAVENOUS | Qty: 5 | Status: AC

## 2021-09-10 MED FILL — Dexamethasone Sodium Phosphate Inj 100 MG/10ML: INTRAMUSCULAR | Qty: 1 | Status: AC

## 2021-09-10 NOTE — Progress Notes (Signed)
[facility]  PROGRESS NOTE  Patient Care Team: Leeanne Rio, MD as PCP - General (Family Medicine) Harl Bowie, Alphonse Guild, MD as PCP - Cardiology (Cardiology) Patient, No Pcp Per (General Practice) Mauro Kaufmann, RN as Oncology Nurse Navigator Rockwell Germany, RN as Oncology Nurse Navigator [name], MD as Consulting Physician (Hematology and Oncology)  CHIEF COMPLAINTS/PURPOSE OF CONSULTATION:  Metastatic breast cancer  SUMMARY OF ONCOLOGIC HISTORY: Oncology History  Inflammatory breast cancer, left ([place])  06/23/2021 Mammogram   Suspicious mass in the left breast at 2 o'clock measuring 1.8 cm. There is associated diffuse left breast skin thickening and increased trabeculation concerning for inflammatory breast cancer. There are at least 5 abnormal left axillary lymph nodes. 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   07/11/2021 Pathology Results   Left needle core biopsy from 2 o'clock position showed invasive mammary carcinoma, grade 3, lymph node biopsy showed metastatic carcinoma associated with minimal residual lymphoid tissue.  Prognostic markers could not be reported due to problems with tissue processing.  She will be undergoing repeat biopsy.   07/22/2021 Initial Diagnosis   Inflammatory breast cancer, left ([place])   07/28/2021 Cancer Staging   Staging form: Breast, AJCC 8th Edition - Clinical: Stage IB (cT2, cN1, cM0, G3, ER+, PR+, HER2+) - Signed by [name], MD on 07/28/2021 Histologic grading system: 3 grade system   07/29/2021 Imaging   CT chest abdomen pelvis with contrast for staging for inflammatory breast cancer showed diffuse marrow heterogeneity with scattered ill-defined sclerotic lesions and underlying extensive thoracolumbar spondylosis.  Based on today's bone scan early osseous metastatic disease cannot be excluded.  No other evidence of metastatic disease in the chest abdomen or pelvis.  Known inflammatory left  breast cancer with associated asymmetric left axillary lymph nodes.  Bone scan also confirmed heterogeneous osseous uptake throughout the thoracolumbar spine and multiple ribs suspicious for metastatic disease.  Consider further evaluation with PET/CT or MRI of the thoracolumbar spine.   08/01/2021 -  Chemotherapy   Patient is on Treatment Plan : BREAST  Docetaxel + Trastuzumab + Pertuzumab  (TCHP) q21d        HISTORY OF PRESENTING ILLNESS:   Nicole Cabrera returns for a follow up before undergoing cycle 3 of taxotere, trastuzumab and pertuzumab.  She says she cant eat because nothing tastes good or very few foods taste good. She has been losing weight, lost another 5 lbs. NO tingling or numbness in hands or feet. NO fevers or chills. She has one episode of nose bleeds, didn't last long. With last cycle, diarrhea was not bad, about 3 bowel movements ina  day for the first week of chemotherapy.  She reports no neuropathy today.  She tells me other than the weight loss and loss of appetite, she has been doing quite well.  She has noticed some improvement in the left breast skin redness but she states that the changes are not fast enough.  No new bone pains.  No incontinence or retention reported for both bowel and urinary habits. She is motivated to move forward with chemotherapy as well as Herceptin and pertuzumab today. 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   Dyspnea    Dysrhythmia    Endometrial polyp 04/2000   Fall 01/2018   nose fracture    Heart murmur    Hemorrhoids    History of kidney stones    Hypertension     Impaired glucose tolerance    Obesity    Osteopenia 05/2007   Paroxysmal atrial fibrillation ([place])    Peripheral neuropathy    PMB (postmenopausal bleeding) 04/2000   Pneumonia    Pre-diabetes    SAB (spontaneous abortion) 1981   Sleep apnea    CPAP   Stroke ([place])    no lasting weakness or paralysis, does have some short term memory loss   SVD (spontaneous vaginal delivery) 04/1964, 05/1978   x3   TIA (transient ischemic attack)     SURGICAL HISTORY: Past Surgical History:  Procedure Laterality Date   BREAST BIOPSY Right 06/2020   x2   CATARACT EXTRACTION Right 12/07/2018   CERVICAL [place] SURGERY  04/1991   CESAREAN SECTION  08/1981   with BTL   COLONOSCOPY N/A 03/19/2017   Procedure: COLONOSCOPY;  Surgeon: Doran Stabler, MD;  Location: [facility];  Service: Gastroenterology;  Laterality: N/A;   DILATION AND CURETTAGE OF UTERUS     HYSTEROSCOPY  5/02, 01/2011   JOINT REPLACEMENT     LITHOTRIPSY  08/30/2014   with Dr. 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   Patient is a homemaker.   Education- College   Right handed.   Caffeine- one coke cola          Social Determinants of Health   Financial Resource Strain: Not on file  Food Insecurity: Not on file  Transportation Needs: Not on file  Physical Activity: Not on file  Stress: Not on file  Social Connections: Not on file  Intimate Partner Violence: Not At Risk (07/31/2021)   Humiliation, Afraid, Rape, and Kick questionnaire    Fear of Current or Ex-Partner: No    Emotionally Abused: No    Physically Abused: No    Sexually Abused: No    FAMILY HISTORY: Family History  Problem Relation Age of  Onset   Heart disease Father    Colon cancer Mother    Heart disease Mother    Breast cancer Maternal Aunt    Colon cancer Maternal Aunt    Colon cancer Maternal Uncle    Ehlers-Danlos syndrome Other        neice-being tested 4/15   Stomach cancer Neg Hx    Pancreatic cancer Neg Hx    Endometrial cancer Neg Hx    Ovarian cancer Neg Hx     ALLERGIES:  is allergic to bactrim [sulfamethoxazole-trimethoprim], ciprofloxacin, hydroxyzine hcl, keflet [cephalexin], lipitor [atorvastatin], lisinopril, spironolactone, vistaril [hydroxyzine hcl], erythromycin, latex, penicillins, tape, and tetracycline.  MEDICATIONS:  Current Outpatient Medications  Medication Sig Dispense Refill   acetaminophen (TYLENOL) 500 MG tablet Take 1,000 mg by mouth every 6 (six) hours as needed for moderate pain or headache.      albuterol (PROAIR HFA) 108 (90 Base) MCG/ACT inhaler INHALE TWO PUFFS BY MOUTH EVERY 6 HOURS AS NEEDED FOR WHEEZING AND SHORTNESS OF BREATH 18 g 12   ALPRAZolam (XANAX) 0.25 MG tablet Take 0.25 mg by mouth daily as needed (peripheral neuropathy).     chlorthalidone (HYGROTON) 25 MG tablet TAKE ONE TABLET BY MOUTH DAILY. 90 tablet 3   cholecalciferol (VITAMIN D) 1000 UNITS tablet Take 1,000 Units by mouth  every morning.      dexamethasone (DECADRON) 4 MG tablet Take 2 tablets (8 mg total) by mouth 2 (two) times daily. Start the day before Taxotere. Then take daily x 3 days after chemotherapy. 30 tablet 1   guaiFENesin (MUCINEX) 600 MG 12 hr tablet Take 600 mg by mouth daily as needed for cough.     hydrocortisone cream 1 % Apply 1 application  topically daily. Mixed with clotrimazole cream. Washing in nizoral shampoo Applying 50/50 mix to lower abdomen     lidocaine-prilocaine (EMLA) cream Apply to affected area once 30 g 3   losartan (COZAAR) 100 MG tablet TAKE ONE TABLET BY MOUTH DAILY. 90 tablet 3   metoprolol tartrate (LOPRESSOR) 25 MG tablet TAKE ONE TABLET BY MOUTH TWICE DAILY, MAY TAKE ONE EXTRA TABLET AS NEEDED FOR PALPITATIONS 75 tablet 3   Multiple Vitamins-Minerals (MULTIVITAMIN WITH MINERALS) tablet Take 1 tablet by mouth daily. Woman's 50 +     nystatin (MYCOSTATIN/NYSTOP) powder Apply 1 application  topically 3 (three) times daily. 15 g 3   ondansetron (ZOFRAN) 8 MG tablet Take 1 tablet (8 mg total) by mouth 2 (two) times daily as needed (Nausea or vomiting). Start on the third day after chemotherapy. 30 tablet 1   Polyethyl Glycol-Propyl Glycol (SYSTANE OP) Place 1 drop into both eyes daily as needed (dry eyes).      potassium chloride SA (KLOR-CON) 20 MEQ tablet TAKE ONE TABLET BY MOUTH DAILY. 90 tablet 3   prochlorperazine (COMPAZINE) 10 MG tablet Take 1 tablet (10 mg total) by mouth every 6 (six) hours as needed (Nausea or vomiting). 30 tablet 1   sodium chloride (OCEAN) 0.65 % SOLN nasal spray Place 1 spray into both nostrils as needed for congestion.     No current facility-administered medications for this visit.    REVIEW OF SYSTEMS:   Constitutional: Denies fevers, chills or abnormal night sweats Eyes: Denies blurriness of vision, double vision or watery eyes Ears, nose, mouth, throat, and face: Denies mucositis or sore throat Respiratory: Denies cough, dyspnea or  wheezes Cardiovascular: Denies palpitation, chest discomfort or lower extremity swelling Gastrointestinal:  Denies nausea, heartburn or change in bowel  habits Skin: Denies abnormal skin rashes Lymphatics: Denies new lymphadenopathy or easy bruising Neurological:Denies numbness, tingling or new weaknesses Behavioral/Psych: Mood is stable, no new changes  Breast: Improved redness and swelling of the left breast All other systems were reviewed with the patient and are negative.  PHYSICAL EXAMINATION: ECOG PERFORMANCE STATUS: 0 - Asymptomatic  Vitals:   09/11/21 0928  BP: (!) 173/83  Pulse: 79  Resp: 18  Temp: 97.9 F (36.6 C)  SpO2: 96%     Filed Weights   09/11/21 0928  Weight: 245 lb 4.8 oz (111.3 kg)   Physical Exam Constitutional:      Appearance: Normal appearance.  Cardiovascular:     Rate and Rhythm: Normal rate and regular rhythm.     Pulses: Normal pulses.     Heart sounds: Normal heart sounds.  Chest:     Comments: Left breast certainly appears less erythematous, still a significant amount of edema noted.  No palpable left axillary lymphadenopathy Musculoskeletal:        General: No swelling or tenderness.     Cervical back: Normal range of motion and neck supple. No rigidity.  Lymphadenopathy:     Cervical: No cervical adenopathy.  Skin:    General: Skin is warm and dry.  Neurological:     Mental Status: She is alert.      LABORATORY DATA:  I have reviewed the data as listed Lab Results  Component Value Date   WBC 8.1 09/11/2021   HGB 11.8 (L) 09/11/2021   HCT 35.8 (L) 09/11/2021   MCV 87.5 09/11/2021   PLT 292 09/11/2021   Lab Results  Component Value Date   NA 139 09/11/2021   K 3.4 (L) 09/11/2021   CL 103 09/11/2021   CO2 29 09/11/2021    RADIOGRAPHIC STUDIES: I have personally reviewed the radiological reports and agreed with the findings in the report.  ASSESSMENT AND PLAN:  Nicole Cabrera is a 76 y.o. female returns for a follow  up for inflammatory breast cancer.   #Metastatic left sided inflammatory breast cancer, grade 3, HER2 positive: --Baseline echo from 06/17/2021 with EF 65-70%, next one due in June 2023.  Most recent echo with slight drop in ejection fraction of 60 to 65%.  We will continue to monitor --Started systemic chemotherapy with taxotere, trastuzumab and pertuzumab on 08/01/2021.  --PET scan from 08/13/2021 showed widespread metastatic disease involving the bone. --MRI brain at baseline without any intracranial metastatic disease. --Patient is tolerating treatment well without any significant limitations. Recommend to proceed with Cycle 3 scheduled for 09/11/2021.  --I have ordered repeat imaging after cycle 3 of chemotherapy.  #Appetite loss/weight loss:  --Sent referral to nutrition to help improve PO intake.  She is yet to meet with a nutritionist.  Once again encouraged to try condensed calories and nutritional shakes.  #Diarrhea:  --Recommend to continue to take imodium.   Follow up:  Labs from today satisfactory to proceed with treatment. Return to clinic in 3 weeks  All questions were answered. The patient knows to call the clinic with any problems, questions or concerns.  I have spent a total of 30 minutes minutes of face-to-face and non-face-to-face time, preparing to see the patient,  performing a medically appropriate examination, counseling and educating the patient,  referring and communicating with other health care professionals, documenting clinical information in the electronic health record, and care coordination.

## 2021-09-10 NOTE — Progress Notes (Signed)
  Echocardiogram 2D Echocardiogram has been performed.  Darlina Sicilian M 09/10/2021, 11:20 AM

## 2021-09-11 ENCOUNTER — Other Ambulatory Visit: Payer: BC Managed Care – PPO

## 2021-09-11 ENCOUNTER — Encounter: Payer: Self-pay | Admitting: Hematology and Oncology

## 2021-09-11 ENCOUNTER — Inpatient Hospital Stay (HOSPITAL_BASED_OUTPATIENT_CLINIC_OR_DEPARTMENT_OTHER): Payer: BC Managed Care – PPO | Admitting: Hematology and Oncology

## 2021-09-11 ENCOUNTER — Inpatient Hospital Stay: Payer: BC Managed Care – PPO

## 2021-09-11 ENCOUNTER — Encounter: Payer: Self-pay | Admitting: Genetic Counselor

## 2021-09-11 ENCOUNTER — Other Ambulatory Visit: Payer: Self-pay

## 2021-09-11 ENCOUNTER — Inpatient Hospital Stay (HOSPITAL_BASED_OUTPATIENT_CLINIC_OR_DEPARTMENT_OTHER): Payer: BC Managed Care – PPO | Admitting: Genetic Counselor

## 2021-09-11 ENCOUNTER — Inpatient Hospital Stay: Payer: BC Managed Care – PPO | Admitting: Dietician

## 2021-09-11 VITALS — BP 116/72 | HR 88 | Temp 97.9°F | Resp 22

## 2021-09-11 VITALS — BP 173/83 | HR 79 | Temp 97.9°F | Resp 18 | Ht 59.0 in | Wt 245.3 lb

## 2021-09-11 DIAGNOSIS — C50912 Malignant neoplasm of unspecified site of left female breast: Secondary | ICD-10-CM

## 2021-09-11 DIAGNOSIS — Z800 Family history of malignant neoplasm of digestive organs: Secondary | ICD-10-CM | POA: Insufficient documentation

## 2021-09-11 DIAGNOSIS — Z803 Family history of malignant neoplasm of breast: Secondary | ICD-10-CM | POA: Diagnosis not present

## 2021-09-11 DIAGNOSIS — Z5112 Encounter for antineoplastic immunotherapy: Secondary | ICD-10-CM | POA: Diagnosis not present

## 2021-09-11 LAB — CMP (CANCER CENTER ONLY)
ALT: 20 U/L (ref 0–44)
AST: 21 U/L (ref 15–41)
Albumin: 3.4 g/dL — ABNORMAL LOW (ref 3.5–5.0)
Alkaline Phosphatase: 111 U/L (ref 38–126)
Anion gap: 7 (ref 5–15)
BUN: 13 mg/dL (ref 8–23)
CO2: 29 mmol/L (ref 22–32)
Calcium: 8.9 mg/dL (ref 8.9–10.3)
Chloride: 103 mmol/L (ref 98–111)
Creatinine: 0.75 mg/dL (ref 0.44–1.00)
GFR, Estimated: >60 mL/min (ref >60)
Glucose, Bld: 115 mg/dL — ABNORMAL HIGH (ref 70–99)
Potassium: 3.4 mmol/L — ABNORMAL LOW (ref 3.5–5.1)
Sodium: 139 mmol/L (ref 135–145)
Total Bilirubin: 0.7 mg/dL (ref 0.3–1.2)
Total Protein: 6.2 g/dL — ABNORMAL LOW (ref 6.5–8.1)

## 2021-09-11 LAB — CBC WITH DIFFERENTIAL (CANCER CENTER ONLY)
Abs Immature Granulocytes: 0.05 10*3/uL (ref 0.00–0.07)
Basophils Absolute: 0.1 10*3/uL (ref 0.0–0.1)
Basophils Relative: 1 %
Eosinophils Absolute: 0 10*3/uL (ref 0.0–0.5)
Eosinophils Relative: 0 %
HCT: 35.8 % — ABNORMAL LOW (ref 36.0–46.0)
Hemoglobin: 11.8 g/dL — ABNORMAL LOW (ref 12.0–15.0)
Immature Granulocytes: 1 %
Lymphocytes Relative: 16 %
Lymphs Abs: 1.3 10*3/uL (ref 0.7–4.0)
MCH: 28.9 pg (ref 26.0–34.0)
MCHC: 33 g/dL (ref 30.0–36.0)
MCV: 87.5 fL (ref 80.0–100.0)
Monocytes Absolute: 1 10*3/uL (ref 0.1–1.0)
Monocytes Relative: 12 %
Neutro Abs: 5.6 10*3/uL (ref 1.7–7.7)
Neutrophils Relative %: 70 %
Platelet Count: 292 10*3/uL (ref 150–400)
RBC: 4.09 MIL/uL (ref 3.87–5.11)
RDW: 16.7 % — ABNORMAL HIGH (ref 11.5–15.5)
WBC Count: 8.1 10*3/uL (ref 4.0–10.5)
nRBC: 0 % (ref 0.0–0.2)

## 2021-09-11 MED ORDER — TRASTUZUMAB-ANNS CHEMO 150 MG IV SOLR
6.0000 mg/kg | Freq: Once | INTRAVENOUS | Status: AC
  Administered 2021-09-11: 693 mg via INTRAVENOUS
  Filled 2021-09-11: qty 33

## 2021-09-11 MED ORDER — SODIUM CHLORIDE 0.9 % IV SOLN
420.0000 mg | Freq: Once | INTRAVENOUS | Status: AC
  Administered 2021-09-11: 420 mg via INTRAVENOUS
  Filled 2021-09-11: qty 14

## 2021-09-11 MED ORDER — SODIUM CHLORIDE 0.9 % IV SOLN: Freq: Once | INTRAVENOUS | Status: AC

## 2021-09-11 MED ORDER — DIPHENHYDRAMINE HCL 25 MG PO CAPS
50.0000 mg | ORAL_CAPSULE | Freq: Once | ORAL | Status: AC
  Administered 2021-09-11: 50 mg via ORAL
  Filled 2021-09-11: qty 2

## 2021-09-11 MED ORDER — ACETAMINOPHEN 325 MG PO TABS
650.0000 mg | ORAL_TABLET | Freq: Once | ORAL | Status: AC
  Administered 2021-09-11: 650 mg via ORAL
  Filled 2021-09-11: qty 2

## 2021-09-11 MED ORDER — SODIUM CHLORIDE 0.9 % IV SOLN
40.0000 mg/m2 | Freq: Once | INTRAVENOUS | Status: AC
  Administered 2021-09-11: 90 mg via INTRAVENOUS
  Filled 2021-09-11: qty 9

## 2021-09-11 MED ORDER — SODIUM CHLORIDE 0.9 % IV SOLN
150.0000 mg | Freq: Once | INTRAVENOUS | Status: AC
  Administered 2021-09-11: 150 mg via INTRAVENOUS
  Filled 2021-09-11: qty 150

## 2021-09-11 MED ORDER — HEPARIN SOD (PORK) LOCK FLUSH 100 UNIT/ML IV SOLN
500.0000 [IU] | Freq: Once | INTRAVENOUS | Status: AC | PRN
  Administered 2021-09-11: 500 [IU]

## 2021-09-11 MED ORDER — SODIUM CHLORIDE 0.9% FLUSH
10.0000 mL | INTRAVENOUS | Status: DC | PRN
  Administered 2021-09-11: 10 mL

## 2021-09-11 MED ORDER — PALONOSETRON HCL INJECTION 0.25 MG/5ML
0.2500 mg | Freq: Once | INTRAVENOUS | Status: AC
  Administered 2021-09-11: 0.25 mg via INTRAVENOUS
  Filled 2021-09-11: qty 5

## 2021-09-11 MED ORDER — SODIUM CHLORIDE 0.9 % IV SOLN
10.0000 mg | Freq: Once | INTRAVENOUS | Status: AC
  Administered 2021-09-11: 10 mg via INTRAVENOUS
  Filled 2021-09-11: qty 10

## 2021-09-11 NOTE — Patient Instructions (Signed)
[facility]  Discharge Instructions: Thank you for choosing [facility] to provide your oncology and hematology care.   If you have a lab appointment with the [facility], please go directly to the [facility] and check in at the registration area.   Wear comfortable clothing and clothing appropriate for easy access to any Portacath or PICC line.   We strive to give you quality time with your provider. You may need to reschedule your appointment if you arrive late (15 or more minutes).  Arriving late affects you and other patients whose appointments are after yours.  Also, if you miss three or more appointments without notifying the office, you may be dismissed from the clinic at the provider's discretion.      For prescription refill requests, have your pharmacy contact our office and allow 72 hours for refills to be completed.    Today you received the following chemotherapy and/or immunotherapy agents: Trastuzumab (Kanjinti), Pertuzumab (Perjeta), and Docetaxel (Taxotere).   To help prevent nausea and vomiting after your treatment, we encourage you to take your nausea medication as directed.  BELOW ARE SYMPTOMS THAT SHOULD BE REPORTED IMMEDIATELY: *FEVER GREATER THAN 100.4 F (38 C) OR HIGHER *CHILLS OR SWEATING *NAUSEA AND VOMITING THAT IS NOT CONTROLLED WITH YOUR NAUSEA MEDICATION *UNUSUAL SHORTNESS OF BREATH *UNUSUAL BRUISING OR BLEEDING *URINARY PROBLEMS (pain or burning when urinating, or frequent urination) *BOWEL PROBLEMS (unusual diarrhea, constipation, pain near the anus) TENDERNESS IN MOUTH AND THROAT WITH OR WITHOUT PRESENCE OF ULCERS (sore throat, sores in mouth, or a toothache) UNUSUAL RASH, SWELLING OR PAIN  UNUSUAL VAGINAL DISCHARGE OR ITCHING   Items with * indicate a potential emergency and should be followed up as soon as possible or go to the Emergency Department if any problems should occur.  Please show the  CHEMOTHERAPY ALERT CARD or IMMUNOTHERAPY ALERT CARD at check-in to the Emergency Department and triage nurse.  Should you have questions after your visit or need to cancel or reschedule your appointment, please contact [facility]  Dept: [phone]  and follow the prompts.  Office hours are 8:00 a.m. to 4:30 p.m. Monday - Friday. Please note that voicemails left after 4:00 p.m. may not be returned until the following business day.  We are closed weekends and major holidays. You have access to a nurse at all times for urgent questions. Please call the main number to the clinic Dept: [phone] and follow the prompts.   For any non-urgent questions, you may also contact your provider using MyChart. We now offer e-Visits for anyone 9 and older to request care online for non-urgent symptoms. For details visit [email or web address].   Also download the MyChart app! Go to the app store, search "MyChart", open the app, select [facility], and log in with your MyChart username and password.  Masks are optional in the cancer centers. If you would like for your care team to wear a mask while they are taking care of you, please let them know. For doctor visits, patients may have with them one support person who is at least 77 years old. At this time, visitors are not allowed in the infusion area.

## 2021-09-11 NOTE — Progress Notes (Unsigned)
REFERRING PROVIDER: [name], MD [place], [place] [address]  PRIMARY PROVIDER:  Leeanne Rio, MD  PRIMARY REASON FOR VISIT:  1. Inflammatory breast cancer, left ([place])   2. Family history of breast cancer   3. Family history of colon cancer     HISTORY OF PRESENT ILLNESS:   Ms. Hellstrom, a 76 y.o. female, was seen for a [facility] cancer genetics consultation at the request of Dr. Chryl Heck due to a personal and family history of cancer.  Ms. Golomb presents to clinic today to discuss the possibility of a hereditary predisposition to cancer, to discuss genetic testing, and to further clarify her future cancer risks, as well as potential cancer risks for family members.   In April 2023, at the age of 9, Ms. Lechtenberg was diagnosed with left sided inflammatory breast cancer.    CANCER HISTORY:  Oncology History  Inflammatory breast cancer, left ([facility])  06/23/2021 Mammogram   Suspicious mass in the left breast at 2 o'clock measuring 1.8 cm. There is associated diffuse left breast skin thickening and increased trabeculation concerning for inflammatory breast cancer. There are at least 5 abnormal left axillary lymph nodes. 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   07/11/2021 Pathology Results   Left needle core biopsy from 2 o'clock position showed invasive mammary carcinoma, grade 3, lymph node biopsy showed metastatic carcinoma associated with minimal residual lymphoid tissue.  Prognostic markers could not be reported due to problems with tissue processing.  She will be undergoing repeat biopsy.   07/22/2021 Initial Diagnosis   Inflammatory breast cancer, left ([place])   07/28/2021 Cancer Staging   Staging form: Breast, AJCC 8th Edition - Clinical: Stage IB (cT2, cN1, cM0, G3, ER+, PR+, HER2+) - Signed by [name], MD on 07/28/2021 Histologic grading system: 3 grade system   07/29/2021 Imaging   CT chest abdomen pelvis with contrast for  staging for inflammatory breast cancer showed diffuse marrow heterogeneity with scattered ill-defined sclerotic lesions and underlying extensive thoracolumbar spondylosis.  Based on today's bone scan early osseous metastatic disease cannot be excluded.  No other evidence of metastatic disease in the chest abdomen or pelvis.  Known inflammatory left breast cancer with associated asymmetric left axillary lymph nodes.  Bone scan also confirmed heterogeneous osseous uptake throughout the thoracolumbar spine and multiple ribs suspicious for metastatic disease.  Consider further evaluation with PET/CT or MRI of the thoracolumbar spine.   08/01/2021 -  Chemotherapy   Patient is on Treatment Plan : BREAST  Docetaxel + Trastuzumab + Pertuzumab  (TCHP) q21d         RISK FACTORS:  OCP use for approximately  18 months   Ovaries intact: yes.  Uterus intact: yes.  Menopausal status: postmenopausal.  HRT use: 5 years. Colonoscopy: yes;  reports 7 polyps . Mammogram within the last year: yes. 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   Dyspnea    Dysrhythmia    Endometrial polyp 04/2000   Fall 01/2018   nose fracture    Heart  murmur    Hemorrhoids    History of kidney stones    Hypertension    Impaired glucose tolerance    Obesity    Osteopenia 05/2007   Paroxysmal atrial fibrillation ([facility])    Peripheral neuropathy    PMB (postmenopausal bleeding) 04/2000   Pneumonia    Pre-diabetes    SAB (spontaneous abortion) 1981   Sleep apnea    CPAP   Stroke ([facility])    no lasting weakness or paralysis, does have some short term memory loss   SVD (spontaneous vaginal  delivery) 04/1964, 05/1978   x3   TIA (transient ischemic attack)     Past Surgical History:  Procedure Laterality Date   BREAST BIOPSY Right 06/2020   x2   CATARACT EXTRACTION Right 12/07/2018   CERVICAL [place] SURGERY  04/1991   CESAREAN SECTION  08/1981   with BTL   COLONOSCOPY N/A 03/19/2017   Procedure: COLONOSCOPY;  Surgeon: Doran Stabler, MD;  Location: [facility];  Service: Gastroenterology;  Laterality: N/A;   DILATION AND CURETTAGE OF UTERUS     HYSTEROSCOPY  5/02, 01/2011   JOINT REPLACEMENT     LITHOTRIPSY  08/30/2014   with Dr. 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   Patient is a homemaker.   Education- College   Right handed.   Caffeine- one coke cola          Social Determinants of Health   Financial Resource Strain: Not on file  Food Insecurity: Not on file  Transportation Needs: Not on file  Physical Activity: Not on file  Stress: Not on file  Social Connections: Not on  file     FAMILY HISTORY:  We obtained a detailed, 4-generation family history.  Significant diagnoses are listed below: Family History  Problem Relation Age of Onset   Colon cancer Mother 39 - 47   Breast cancer Maternal Aunt 30 - 39   Colon cancer Maternal Aunt 80 - 89   Colon cancer Maternal Uncle 79   Cancer Paternal Grandfather        unknown type       ***  Ms. Torrico is unaware of previous family history of genetic testing for hereditary cancer risks. There is no reported Ashkenazi Jewish ancestry.   GENETIC COUNSELING ASSESSMENT: Ms. Aldea is a  76 y.o. female with a personal and family history of cancer which is somewhat suggestive of a hereditary predisposition to cancer. We, therefore, discussed and recommended the following at today's visit.   DISCUSSION: We discussed that 5 - 10% of cancer is hereditary, with most cases of breast cancer associated with BRCA1/2.  There are other genes that can be associated with hereditary breast cancer syndromes.  We discussed that testing is beneficial for several reasons including knowing how to follow individuals after completing their treatment, identifying whether potential treatment options would be beneficial, and understanding if other family members could be at risk for cancer and allowing them to undergo genetic testing.   We reviewed the characteristics, features and inheritance patterns of hereditary cancer syndromes. We also discussed genetic testing, including the appropriate family members to test, the process of testing, insurance coverage and turn-around-time for results. We discussed the implications of a negative, positive, carrier and/or variant of uncertain significant result. We recommended Ms. Munce pursue genetic testing for a panel that includes genes associated with breast and colon cancer.   Ms. Hufstedler  was offered a common hereditary cancer panel (47 genes) and an expanded pan-cancer panel (77 genes). Ms. Yankovich was  informed of the benefits and limitations of each panel, including that expanded pan-cancer panels contain genes that do not have clear management guidelines at this point in time.  We also discussed that as the number of genes included on a panel increases, the chances of variants of uncertain significance increases.  After considering the benefits and limitations of each gene panel, Ms. Dukes elected to have [facility] CancerNext-Expanded Panel.  The CancerNext-Expanded gene panel offered by [facility] and includes sequencing, rearrangement, and RNA analysis for the following 77 genes: AIP, ALK, APC, ATM, AXIN2, BAP1, BARD1, BLM, BMPR1A, BRCA1, BRCA2, BRIP1, CDC73, CDH1, CDK4, CDKN1B, CDKN2A, CHEK2, CTNNA1, DICER1, FANCC, FH, FLCN, GALNT12, KIF1B, LZTR1, MAX, MEN1, MET, MLH1, MSH2, MSH3, MSH6, MUTYH, NBN, NF1, NF2, NTHL1, PALB2, PHOX2B, PMS2, POT1, PRKAR1A, PTCH1, PTEN, RAD51C, RAD51D, RB1, RECQL, RET, SDHA, SDHAF2, SDHB, SDHC, SDHD, SMAD4, SMARCA4, SMARCB1, SMARCE1, STK11, SUFU, TMEM127, TP53, TSC1, TSC2, VHL and XRCC2 (sequencing and deletion/duplication); EGFR, EGLN1, HOXB13, KIT, MITF, PDGFRA, POLD1, and POLE (sequencing only); EPCAM and GREM1 (deletion/duplication only).   Based on Ms. Marcano's personal and family history of cancer, she meets medical criteria for genetic testing. Despite that she meets criteria, she may still have an out of pocket cost. We discussed that if her out of pocket cost for testing is over $100, the laboratory will call and confirm whether she wants to proceed with testing.  If the out of pocket cost of testing is less than $100 she will be billed by the genetic testing laboratory.   PLAN: After considering the risks, benefits, and limitations, Ms. Klump provided informed consent to pursue genetic testing and the blood sample was sent to [facility] for analysis of the CancerNext-Expanded Panel. Results should be available within approximately {TAT TIME} weeks' time, at  which point they will be disclosed by telephone to Ms. Bartling, as will any additional recommendations warranted by these results. Ms. Settlemyre will receive a summary of her genetic counseling visit and a copy of her results once available. This information will also be available in Epic.   Ms. Bassette questions were answered to her satisfaction today. Our contact information was provided should additional questions or concerns arise. Thank you for the referral and allowing [place] to share in the care of your patient.  Lucille Passy, MS, [facility] Genetic Counselor [email or web address] (P) [phone]  The patient was seen for a total of 40 minutes in face-to-face genetic counseling.  The patient brought her daughter and husband.  Drs. Lindi Adie and/or Burr Medico were available to discuss this case as needed.   _______________________________________________________________________ For Office Staff:  Number of people involved in session: 3 Was an Intern/ student involved with case: no

## 2021-09-12 ENCOUNTER — Encounter: Payer: Self-pay | Admitting: Hematology and Oncology

## 2021-09-13 ENCOUNTER — Other Ambulatory Visit: Payer: Self-pay

## 2021-09-13 ENCOUNTER — Inpatient Hospital Stay: Payer: BC Managed Care – PPO

## 2021-09-13 VITALS — BP 150/92 | HR 78 | Temp 97.7°F

## 2021-09-13 DIAGNOSIS — Z5112 Encounter for antineoplastic immunotherapy: Secondary | ICD-10-CM | POA: Diagnosis not present

## 2021-09-13 DIAGNOSIS — C50912 Malignant neoplasm of unspecified site of left female breast: Secondary | ICD-10-CM

## 2021-09-13 MED ORDER — PEGFILGRASTIM-CBQV 6 MG/0.6ML ~~LOC~~ SOSY
6.0000 mg | PREFILLED_SYRINGE | Freq: Once | SUBCUTANEOUS | Status: AC
  Administered 2021-09-13: 6 mg via SUBCUTANEOUS
  Filled 2021-09-13: qty 0.6

## 2021-09-21 ENCOUNTER — Encounter: Payer: Self-pay | Admitting: Hematology and Oncology

## 2021-09-22 ENCOUNTER — Inpatient Hospital Stay (HOSPITAL_BASED_OUTPATIENT_CLINIC_OR_DEPARTMENT_OTHER): Payer: BC Managed Care – PPO | Admitting: Hematology and Oncology

## 2021-09-22 ENCOUNTER — Other Ambulatory Visit: Payer: Self-pay | Admitting: *Deleted

## 2021-09-22 ENCOUNTER — Other Ambulatory Visit: Payer: Self-pay

## 2021-09-22 ENCOUNTER — Encounter: Payer: Self-pay | Admitting: Hematology and Oncology

## 2021-09-22 ENCOUNTER — Telehealth: Payer: Self-pay | Admitting: *Deleted

## 2021-09-22 ENCOUNTER — Inpatient Hospital Stay: Payer: BC Managed Care – PPO | Attending: Hematology and Oncology

## 2021-09-22 VITALS — BP 138/66 | HR 87 | Temp 98.8°F | Resp 24

## 2021-09-22 DIAGNOSIS — Z79899 Other long term (current) drug therapy: Secondary | ICD-10-CM | POA: Insufficient documentation

## 2021-09-22 DIAGNOSIS — Z170 Estrogen receptor positive status [ER+]: Secondary | ICD-10-CM | POA: Insufficient documentation

## 2021-09-22 DIAGNOSIS — R197 Diarrhea, unspecified: Secondary | ICD-10-CM | POA: Diagnosis not present

## 2021-09-22 DIAGNOSIS — R5383 Other fatigue: Secondary | ICD-10-CM | POA: Insufficient documentation

## 2021-09-22 DIAGNOSIS — F32A Depression, unspecified: Secondary | ICD-10-CM | POA: Diagnosis not present

## 2021-09-22 DIAGNOSIS — C7951 Secondary malignant neoplasm of bone: Secondary | ICD-10-CM | POA: Insufficient documentation

## 2021-09-22 DIAGNOSIS — Z5112 Encounter for antineoplastic immunotherapy: Secondary | ICD-10-CM | POA: Insufficient documentation

## 2021-09-22 DIAGNOSIS — C50912 Malignant neoplasm of unspecified site of left female breast: Secondary | ICD-10-CM | POA: Diagnosis not present

## 2021-09-22 DIAGNOSIS — Z95828 Presence of other vascular implants and grafts: Secondary | ICD-10-CM

## 2021-09-22 DIAGNOSIS — C50412 Malignant neoplasm of upper-outer quadrant of left female breast: Secondary | ICD-10-CM | POA: Diagnosis present

## 2021-09-22 DIAGNOSIS — R112 Nausea with vomiting, unspecified: Secondary | ICD-10-CM | POA: Insufficient documentation

## 2021-09-22 DIAGNOSIS — Z5111 Encounter for antineoplastic chemotherapy: Secondary | ICD-10-CM | POA: Diagnosis present

## 2021-09-22 DIAGNOSIS — Z7952 Long term (current) use of systemic steroids: Secondary | ICD-10-CM | POA: Diagnosis not present

## 2021-09-22 DIAGNOSIS — T451X5A Adverse effect of antineoplastic and immunosuppressive drugs, initial encounter: Secondary | ICD-10-CM

## 2021-09-22 DIAGNOSIS — K521 Toxic gastroenteritis and colitis: Secondary | ICD-10-CM | POA: Diagnosis not present

## 2021-09-22 LAB — CBC WITH DIFFERENTIAL (CANCER CENTER ONLY)
Abs Immature Granulocytes: 0.96 10*3/uL — ABNORMAL HIGH (ref 0.00–0.07)
Basophils Absolute: 0.1 10*3/uL (ref 0.0–0.1)
Basophils Relative: 1 %
Eosinophils Absolute: 0 10*3/uL (ref 0.0–0.5)
Eosinophils Relative: 0 %
HCT: 35.1 % — ABNORMAL LOW (ref 36.0–46.0)
Hemoglobin: 11.6 g/dL — ABNORMAL LOW (ref 12.0–15.0)
Immature Granulocytes: 5 %
Lymphocytes Relative: 8 %
Lymphs Abs: 1.4 10*3/uL (ref 0.7–4.0)
MCH: 28.9 pg (ref 26.0–34.0)
MCHC: 33 g/dL (ref 30.0–36.0)
MCV: 87.5 fL (ref 80.0–100.0)
Monocytes Absolute: 1.4 10*3/uL — ABNORMAL HIGH (ref 0.1–1.0)
Monocytes Relative: 7 %
Neutro Abs: 15.3 10*3/uL — ABNORMAL HIGH (ref 1.7–7.7)
Neutrophils Relative %: 79 %
Platelet Count: 182 10*3/uL (ref 150–400)
RBC: 4.01 MIL/uL (ref 3.87–5.11)
RDW: 17.9 % — ABNORMAL HIGH (ref 11.5–15.5)
WBC Count: 19.3 10*3/uL — ABNORMAL HIGH (ref 4.0–10.5)
nRBC: 0.2 % (ref 0.0–0.2)

## 2021-09-22 LAB — CMP (CANCER CENTER ONLY)
ALT: 26 U/L (ref 0–44)
AST: 25 U/L (ref 15–41)
Albumin: 3.2 g/dL — ABNORMAL LOW (ref 3.5–5.0)
Alkaline Phosphatase: 134 U/L — ABNORMAL HIGH (ref 38–126)
Anion gap: 6 (ref 5–15)
BUN: 13 mg/dL (ref 8–23)
CO2: 31 mmol/L (ref 22–32)
Calcium: 8.5 mg/dL — ABNORMAL LOW (ref 8.9–10.3)
Chloride: 100 mmol/L (ref 98–111)
Creatinine: 0.83 mg/dL (ref 0.44–1.00)
GFR, Estimated: >60 mL/min (ref >60)
Glucose, Bld: 110 mg/dL — ABNORMAL HIGH (ref 70–99)
Potassium: 3.5 mmol/L (ref 3.5–5.1)
Sodium: 137 mmol/L (ref 135–145)
Total Bilirubin: 0.8 mg/dL (ref 0.3–1.2)
Total Protein: 5.7 g/dL — ABNORMAL LOW (ref 6.5–8.1)

## 2021-09-22 LAB — MAGNESIUM: Magnesium: 1.8 mg/dL (ref 1.7–2.4)

## 2021-09-22 MED ORDER — HEPARIN SOD (PORK) LOCK FLUSH 100 UNIT/ML IV SOLN
500.0000 [IU] | Freq: Once | INTRAVENOUS | Status: AC
  Administered 2021-09-22: 500 [IU]

## 2021-09-22 MED ORDER — SODIUM CHLORIDE 0.9 % IV SOLN: INTRAVENOUS | Status: AC

## 2021-09-22 MED ORDER — SODIUM CHLORIDE 0.9% FLUSH
10.0000 mL | Freq: Once | INTRAVENOUS | Status: AC
  Administered 2021-09-22: 10 mL

## 2021-09-22 NOTE — Patient Instructions (Signed)
Rehydration, Adult Rehydration is the replacement of body fluids, salts, and minerals (electrolytes) that are lost during dehydration. Dehydration is when there is not enough water or other fluids in the body. This happens when you lose more fluids than you take in. Common causes of dehydration include: Not drinking enough fluids. This can occur when you are ill or doing activities that require a lot of energy, especially in hot weather. Conditions that cause loss of water or other fluids, such as diarrhea, vomiting, sweating, or urinating a lot. Other illnesses, such as fever or infection. Certain medicines, such as those that remove excess fluid from the body (diuretics). Symptoms of mild or moderate dehydration may include thirst, dry lips and mouth, and dizziness. Symptoms of severe dehydration may include increased heart rate, confusion, fainting, and not urinating. For severe dehydration, you may need to get fluids through an IV at the hospital. For mild or moderate dehydration, you can usually rehydrate at home by drinking certain fluids as told by your health care provider. What are the risks? Generally, rehydration is safe. However, taking in too much fluid (overhydration) can be a problem. This is rare. Overhydration can cause an electrolyte imbalance, kidney failure, or a decrease in salt (sodium) levels in the body. Supplies needed You will need an oral rehydration solution (ORS) if your health care provider tells you to use one. This is a drink to treat dehydration. It can be found in pharmacies and retail stores. How to rehydrate Fluids Follow instructions from your health care provider for rehydration. The kind of fluid and the amount you should drink depend on your condition. In general, you should choose drinks that you prefer. If told by your health care provider, drink an ORS. Make an ORS by following instructions on the package. Start by drinking small amounts, about  cup (120  mL) every 5-10 minutes. Slowly increase how much you drink until you have taken the amount recommended by your health care provider. Drink enough clear fluids to keep your urine pale yellow. If you were told to drink an ORS, finish it first, then start slowly drinking other clear fluids. Drink fluids such as: Water. This includes sparkling water and flavored water. Drinking only water can lead to having too little sodium in your body (hyponatremia). Follow the advice of your health care provider. Water from ice chips you suck on. Fruit juice with water you add to it (diluted). Sports drinks. Hot or cold herbal teas. Broth-based soups. Milk or milk products. Food Follow instructions from your health care provider about what to eat while you rehydrate. Your health care provider may recommend that you slowly begin eating regular foods in small amounts. Eat foods that contain a healthy balance of electrolytes, such as bananas, oranges, potatoes, tomatoes, and spinach. Avoid foods that are greasy or contain a lot of sugar. In some cases, you may get nutrition through a feeding tube that is passed through your nose and into your stomach (nasogastric tube, or NG tube). This may be done if you have uncontrolled vomiting or diarrhea. Beverages to avoid  Certain beverages may make dehydration worse. While you rehydrate, avoid drinking alcohol. How to tell if you are recovering from dehydration You may be recovering from dehydration if: You are urinating more often than before you started rehydrating. Your urine is pale yellow. Your energy level improves. You vomit less frequently. You have diarrhea less frequently. Your appetite improves or returns to normal. You feel less dizzy or less light-headed.   Your skin tone and color start to look more normal. Follow these instructions at home: Take over-the-counter and prescription medicines only as told by your health care provider. Do not take sodium  tablets. Doing this can lead to having too much sodium in your body (hypernatremia). Contact a health care provider if: You continue to have symptoms of mild or moderate dehydration, such as: Thirst. Dry lips. Slightly dry mouth. Dizziness. Dark urine or less urine than normal. Muscle cramps. You continue to vomit or have diarrhea. Get help right away if you: Have symptoms of dehydration that get worse. Have a fever. Have a severe headache. Have been vomiting and the following happens: Your vomiting gets worse or does not go away. Your vomit includes blood or green matter (bile). You cannot eat or drink without vomiting. Have problems with urination or bowel movements, such as: Diarrhea that gets worse or does not go away. Blood in your stool (feces). This may cause stool to look black and tarry. Not urinating, or urinating only a small amount of very dark urine, within 6-8 hours. Have trouble breathing. Have symptoms that get worse with treatment. These symptoms may represent a serious problem that is an emergency. Do not wait to see if the symptoms will go away. Get medical help right away. Call your local emergency services (911 in the [place] Do not drive yourself to the hospital. Summary Rehydration is the replacement of body fluids and minerals (electrolytes) that are lost during dehydration. Follow instructions from your health care provider for rehydration. The kind of fluid and amount you should drink depend on your condition. Slowly increase how much you drink until you have taken the amount recommended by your health care provider. Contact your health care provider if you continue to show signs of mild or moderate dehydration. This information is not intended to replace advice given to you by your health care provider. Make sure you discuss any questions you have with your health care provider. Document Revised: 05/10/2019 Document Reviewed: 03/20/2019 Elsevier Patient  Education  2023 [employer]  

## 2021-09-22 NOTE — Progress Notes (Signed)
[place]  PROGRESS NOTE  Patient Care Team: Leeanne Rio, MD as PCP - General (Family Medicine) Harl Bowie, Alphonse Guild, MD as PCP - Cardiology (Cardiology) Patient, No Pcp Per (General Practice) Mauro Kaufmann, RN as Oncology Nurse Navigator Rockwell Germany, RN as Oncology Nurse Navigator [name], MD as Consulting Physician (Hematology and Oncology)  CHIEF COMPLAINTS/PURPOSE OF CONSULTATION:  Metastatic breast cancer  SUMMARY OF ONCOLOGIC HISTORY: Oncology History  Inflammatory breast cancer, left ([place])  06/23/2021 Mammogram   Suspicious mass in the left breast at 2 o'clock measuring 1.8 cm. There is associated diffuse left breast skin thickening and increased trabeculation concerning for inflammatory breast cancer. There are at least 5 abnormal left axillary lymph nodes. 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   07/11/2021 Pathology Results   Left needle core biopsy from 2 o'clock position showed invasive mammary carcinoma, grade 3, lymph node biopsy showed metastatic carcinoma associated with minimal residual lymphoid tissue.  Prognostic markers could not be reported due to problems with tissue processing.  She will be undergoing repeat biopsy.   07/22/2021 Initial Diagnosis   Inflammatory breast cancer, left ([place])   07/28/2021 Cancer Staging   Staging form: Breast, AJCC 8th Edition - Clinical: Stage IB (cT2, cN1, cM0, G3, ER+, PR+, HER2+) - Signed by [name], MD on 07/28/2021 Histologic grading system: 3 grade system   07/29/2021 Imaging   CT chest abdomen pelvis with contrast for staging for inflammatory breast cancer showed diffuse marrow heterogeneity with scattered ill-defined sclerotic lesions and underlying extensive thoracolumbar spondylosis.  Based on today's bone scan early osseous metastatic disease cannot be excluded.  No other evidence of metastatic disease in the chest abdomen or pelvis.  Known inflammatory left  breast cancer with associated asymmetric left axillary lymph nodes.  Bone scan also confirmed heterogeneous osseous uptake throughout the thoracolumbar spine and multiple ribs suspicious for metastatic disease.  Consider further evaluation with PET/CT or MRI of the thoracolumbar spine.   08/01/2021 -  Chemotherapy   Patient is on Treatment Plan : BREAST  Docetaxel + Trastuzumab + Pertuzumab  (TCHP) q21d        HISTORY OF PRESENTING ILLNESS:   Nicole Cabrera returns for a follow up after undergoing cycle 3 of taxotere, trastuzumab and pertuzumab.  Husband called and said she is not eating, sleeping too much and feeling depressed. She was seen in infusion, she says she feels ok, she didn't feel like she needed to call She denies depression, did have nausea but this resolved since this morning. 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)    no lasting weakness or paralysis, does have some short term memory loss   SVD (spontaneous vaginal delivery) 04/1964, 05/1978    x3   TIA (transient ischemic attack)     SURGICAL HISTORY: Past Surgical History:  Procedure Laterality Date   BREAST BIOPSY Right 06/2020   x2   CATARACT EXTRACTION Right 12/07/2018   CERVICAL [place] SURGERY  04/1991   CESAREAN SECTION  08/1981   with BTL   COLONOSCOPY N/A 03/19/2017   Procedure: COLONOSCOPY;  Surgeon: Doran Stabler, MD;  Location: [facility];  Service: Gastroenterology;  Laterality: N/A;   DILATION AND CURETTAGE OF UTERUS     HYSTEROSCOPY  5/02, 01/2011   JOINT REPLACEMENT     LITHOTRIPSY  08/30/2014   with Dr. 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   Patient is a homemaker.   Education- College   Right handed.   Caffeine- one coke cola          Social Determinants of Health   Financial Resource Strain: Not on file  Food Insecurity: Not on file  Transportation Needs: Not on file  Physical Activity: Not on file  Stress: Not on file  Social Connections: Not  on file  Intimate Partner Violence: Not At Risk (07/31/2021)   Humiliation, Afraid, Rape, and Kick questionnaire    Fear of Current or Ex-Partner: No    Emotionally Abused: No    Physically Abused: No    Sexually Abused: No    FAMILY HISTORY: Family History  Problem Relation Age of Onset   Colon cancer Mother 90 - 34   Heart disease Mother    Heart disease Father    Breast cancer Maternal Aunt 30 - 39   Colon cancer Maternal Aunt 80 - 89   Colon cancer Maternal Uncle 79   Cancer Paternal Grandfather        unknown type   Ehlers-Danlos syndrome Other        neice-being tested 4/15   Breast cancer Other  Stomach cancer Neg Hx    Pancreatic cancer Neg Hx    Endometrial cancer Neg Hx    Ovarian cancer Neg Hx     ALLERGIES:  is allergic to bactrim [sulfamethoxazole-trimethoprim], ciprofloxacin, hydroxyzine hcl, keflet [cephalexin], lipitor [atorvastatin], lisinopril, spironolactone, vistaril [hydroxyzine hcl], erythromycin, latex, penicillins, tape, and tetracycline.  MEDICATIONS:  Current Outpatient Medications  Medication Sig Dispense Refill   acetaminophen (TYLENOL) 500 MG tablet Take 1,000 mg by mouth every 6 (six) hours as needed for moderate pain or headache.      albuterol (PROAIR HFA) 108 (90 Base) MCG/ACT inhaler INHALE TWO PUFFS BY MOUTH EVERY 6 HOURS AS NEEDED FOR WHEEZING AND SHORTNESS OF BREATH 18 g 12   ALPRAZolam (XANAX) 0.25 MG tablet Take 0.25 mg by mouth daily as needed (peripheral neuropathy).     chlorthalidone (HYGROTON) 25 MG tablet TAKE ONE TABLET BY MOUTH DAILY. 90 tablet 3   cholecalciferol (VITAMIN D) 1000 UNITS tablet Take 1,000 Units by mouth every morning.      dexamethasone (DECADRON) 4 MG tablet Take 2 tablets (8 mg total) by mouth 2 (two) times daily. Start the day before Taxotere. Then take daily x 3 days after chemotherapy. 30 tablet 1   guaiFENesin (MUCINEX) 600 MG 12 hr tablet Take 600 mg by mouth daily as needed for cough.     hydrocortisone  cream 1 % Apply 1 application  topically daily. Mixed with clotrimazole cream. Washing in nizoral shampoo Applying 50/50 mix to lower abdomen     lidocaine-prilocaine (EMLA) cream Apply to affected area once 30 g 3   losartan (COZAAR) 100 MG tablet TAKE ONE TABLET BY MOUTH DAILY. 90 tablet 3   metoprolol tartrate (LOPRESSOR) 25 MG tablet TAKE ONE TABLET BY MOUTH TWICE DAILY, MAY TAKE ONE EXTRA TABLET AS NEEDED FOR PALPITATIONS 75 tablet 3   Multiple Vitamins-Minerals (MULTIVITAMIN WITH MINERALS) tablet Take 1 tablet by mouth daily. Woman's 50 +     nystatin (MYCOSTATIN/NYSTOP) powder Apply 1 application  topically 3 (three) times daily. 15 g 3   ondansetron (ZOFRAN) 8 MG tablet Take 1 tablet (8 mg total) by mouth 2 (two) times daily as needed (Nausea or vomiting). Start on the third day after chemotherapy. 30 tablet 1   Polyethyl Glycol-Propyl Glycol (SYSTANE OP) Place 1 drop into both eyes daily as needed (dry eyes).      potassium chloride SA (KLOR-CON) 20 MEQ tablet TAKE ONE TABLET BY MOUTH DAILY. 90 tablet 3   prochlorperazine (COMPAZINE) 10 MG tablet Take 1 tablet (10 mg total) by mouth every 6 (six) hours as needed (Nausea or vomiting). 30 tablet 1   sodium chloride (OCEAN) 0.65 % SOLN nasal spray Place 1 spray into both nostrils as needed for congestion.     No current facility-administered medications for this visit.   Facility-Administered Medications Ordered in Other Visits  Medication Dose Route Frequency Provider Last Rate Last Admin   0.9 %  sodium chloride infusion   Intravenous Continuous Esmirna Ravan, MD 500 mL/hr at 09/22/21 1437 New Bag at 09/22/21 1437   heparin lock flush 100 unit/mL  500 Units Intracatheter Once Emilyrose Darrah, MD       sodium chloride flush (NS) 0.9 % injection 10 mL  10 mL Intracatheter Once [name], MD        REVIEW OF SYSTEMS:   Constitutional: Denies fevers, chills or abnormal night sweats Eyes: Denies blurriness of vision, double vision  or watery eyes Ears, nose, mouth, throat, and face: Denies  mucositis or sore throat Respiratory: Denies cough, dyspnea or wheezes Cardiovascular: Denies palpitation, chest discomfort or lower extremity swelling Gastrointestinal:  Denies nausea, heartburn or change in bowel habits Skin: Denies abnormal skin rashes Lymphatics: Denies new lymphadenopathy or easy bruising Neurological:Denies numbness, tingling or new weaknesses Behavioral/Psych: Mood is stable, no new changes  Breast: Improved redness and swelling of the left breast All other systems were reviewed with the patient and are negative.  PHYSICAL EXAMINATION: ECOG PERFORMANCE STATUS: 0 - Asymptomatic  There were no vitals filed for this visit.    There were no vitals filed for this visit.  Physical Exam Constitutional:      Appearance: Normal appearance.     Comments: Looks very tired  Cardiovascular:     Rate and Rhythm: Normal rate and regular rhythm.     Pulses: Normal pulses.     Heart sounds: Normal heart sounds.  Chest:     Comments: Breast exam deferred Musculoskeletal:        General: No swelling or tenderness.     Cervical back: Normal range of motion and neck supple. No rigidity.  Lymphadenopathy:     Cervical: No cervical adenopathy.  Skin:    General: Skin is warm and dry.  Neurological:     Mental Status: She is alert.      LABORATORY DATA:  I have reviewed the data as listed Lab Results  Component Value Date   WBC 19.3 (H) 09/22/2021   HGB 11.6 (L) 09/22/2021   HCT 35.1 (L) 09/22/2021   MCV 87.5 09/22/2021   PLT 182 09/22/2021   Lab Results  Component Value Date   NA 137 09/22/2021   K 3.5 09/22/2021   CL 100 09/22/2021   CO2 31 09/22/2021    RADIOGRAPHIC STUDIES: I have personally reviewed the radiological reports and agreed with the findings in the report.  ASSESSMENT AND PLAN:  BERNARD DONAHOO is a 76 y.o. female returns for a follow up for inflammatory breast cancer.    #Metastatic left sided inflammatory breast cancer, grade 3, HER2 positive: --Baseline echo from 06/17/2021 with EF 65-70%, next one due in June 2023.  Most recent echo with slight drop in ejection fraction of 60 to 65%.  We will continue to monitor --Started systemic chemotherapy with taxotere, trastuzumab and pertuzumab on 08/01/2021.  --PET scan from 08/13/2021 showed widespread metastatic disease involving the bone. --MRI brain at baseline without any intracranial metastatic disease. --Patient is having difficulty with nausea, diarrhea, worsening fatigue since last chemo -- she had difficulty with chemo despite multiple dose reductions. -- At this time I would recommend omitting chemo and continue with herceptin and perjeta alone. -- She is agreeable  #Appetite loss/weight loss:  --Recommended to take nausea medications as instructed. --- No nausea today --- As planned above, will omit chemo, poor tolerance  #Diarrhea:  --Recommend to continue to take imodium.  -- No electrolyte imbalance noted on labs today -- She didn't have a BM since yesterday, she appears well hydrated. -- we will continue with IVF as planned today  # CINV, continue zofran and compazine as instructed.  Follow up:  FU with [facility] this week to closely monitor, sent message to our nursing FU with me as scheduled.  All questions were answered. The patient knows to call the clinic with any problems, questions or concerns.  I have spent a total of 30 minutes minutes of face-to-face and non-face-to-face time, preparing to see the patient,  performing a medically appropriate  examination, counseling and educating the patient,  referring and communicating with other health care professionals, documenting clinical information in the electronic health record, and care coordination.

## 2021-09-22 NOTE — Telephone Encounter (Signed)
This RN retrieved a My Chart message from husband entered yesterday stating he is concerned with the patient due her not eating or drinking well and having diarrhea.  This RN called [name]- he states pt is mainly " staying in the bed " " came out  of her room just enough to eat maybe 2 ounces and drink no more then 3 ounces "  " She is really depressed - I mean extremely " he denied that pt states any suiciadial ideation " just doom and gloom feeling that she will die "  Per above appt made for IVF per MD with lab.

## 2021-10-01 MED FILL — Dexamethasone Sodium Phosphate Inj 100 MG/10ML: INTRAMUSCULAR | Qty: 1 | Status: AC

## 2021-10-01 MED FILL — Fosaprepitant Dimeglumine For IV Infusion 150 MG (Base Eq): INTRAVENOUS | Qty: 5 | Status: AC

## 2021-10-02 ENCOUNTER — Inpatient Hospital Stay: Payer: BC Managed Care – PPO

## 2021-10-02 ENCOUNTER — Inpatient Hospital Stay (HOSPITAL_BASED_OUTPATIENT_CLINIC_OR_DEPARTMENT_OTHER): Payer: BC Managed Care – PPO | Admitting: Hematology and Oncology

## 2021-10-02 ENCOUNTER — Other Ambulatory Visit: Payer: Self-pay

## 2021-10-02 ENCOUNTER — Other Ambulatory Visit: Payer: Self-pay | Admitting: *Deleted

## 2021-10-02 VITALS — BP 119/80 | HR 61 | Resp 17

## 2021-10-02 VITALS — BP 138/85 | HR 72 | Temp 97.2°F | Resp 15 | Wt 235.2 lb

## 2021-10-02 DIAGNOSIS — C50912 Malignant neoplasm of unspecified site of left female breast: Secondary | ICD-10-CM

## 2021-10-02 DIAGNOSIS — Z5112 Encounter for antineoplastic immunotherapy: Secondary | ICD-10-CM | POA: Diagnosis not present

## 2021-10-02 DIAGNOSIS — K521 Toxic gastroenteritis and colitis: Secondary | ICD-10-CM

## 2021-10-02 DIAGNOSIS — R630 Anorexia: Secondary | ICD-10-CM

## 2021-10-02 DIAGNOSIS — R112 Nausea with vomiting, unspecified: Secondary | ICD-10-CM

## 2021-10-02 DIAGNOSIS — R309 Painful micturition, unspecified: Secondary | ICD-10-CM

## 2021-10-02 DIAGNOSIS — Z95828 Presence of other vascular implants and grafts: Secondary | ICD-10-CM

## 2021-10-02 DIAGNOSIS — T451X5A Adverse effect of antineoplastic and immunosuppressive drugs, initial encounter: Secondary | ICD-10-CM

## 2021-10-02 LAB — CMP (CANCER CENTER ONLY)
ALT: 20 U/L (ref 0–44)
AST: 20 U/L (ref 15–41)
Albumin: 3 g/dL — ABNORMAL LOW (ref 3.5–5.0)
Alkaline Phosphatase: 97 U/L (ref 38–126)
Anion gap: 7 (ref 5–15)
BUN: 17 mg/dL (ref 8–23)
CO2: 27 mmol/L (ref 22–32)
Calcium: 8.8 mg/dL — ABNORMAL LOW (ref 8.9–10.3)
Chloride: 105 mmol/L (ref 98–111)
Creatinine: 0.79 mg/dL (ref 0.44–1.00)
GFR, Estimated: >60 mL/min (ref >60)
Glucose, Bld: 115 mg/dL — ABNORMAL HIGH (ref 70–99)
Potassium: 3.3 mmol/L — ABNORMAL LOW (ref 3.5–5.1)
Sodium: 139 mmol/L (ref 135–145)
Total Bilirubin: 0.7 mg/dL (ref 0.3–1.2)
Total Protein: 5.8 g/dL — ABNORMAL LOW (ref 6.5–8.1)

## 2021-10-02 LAB — CBC WITH DIFFERENTIAL (CANCER CENTER ONLY)
Abs Immature Granulocytes: 0.06 10*3/uL (ref 0.00–0.07)
Basophils Absolute: 0.1 10*3/uL (ref 0.0–0.1)
Basophils Relative: 1 %
Eosinophils Absolute: 0.1 10*3/uL (ref 0.0–0.5)
Eosinophils Relative: 1 %
HCT: 32.5 % — ABNORMAL LOW (ref 36.0–46.0)
Hemoglobin: 10.7 g/dL — ABNORMAL LOW (ref 12.0–15.0)
Immature Granulocytes: 1 %
Lymphocytes Relative: 15 %
Lymphs Abs: 1.2 10*3/uL (ref 0.7–4.0)
MCH: 29.2 pg (ref 26.0–34.0)
MCHC: 32.9 g/dL (ref 30.0–36.0)
MCV: 88.8 fL (ref 80.0–100.0)
Monocytes Absolute: 0.7 10*3/uL (ref 0.1–1.0)
Monocytes Relative: 9 %
Neutro Abs: 5.7 10*3/uL (ref 1.7–7.7)
Neutrophils Relative %: 73 %
Platelet Count: 355 10*3/uL (ref 150–400)
RBC: 3.66 MIL/uL — ABNORMAL LOW (ref 3.87–5.11)
RDW: 17.9 % — ABNORMAL HIGH (ref 11.5–15.5)
WBC Count: 7.8 10*3/uL (ref 4.0–10.5)
nRBC: 0 % (ref 0.0–0.2)

## 2021-10-02 LAB — GENETIC SCREENING ORDER

## 2021-10-02 MED ORDER — ACETAMINOPHEN 325 MG PO TABS
650.0000 mg | ORAL_TABLET | Freq: Once | ORAL | Status: AC
  Administered 2021-10-02: 650 mg via ORAL
  Filled 2021-10-02: qty 2

## 2021-10-02 MED ORDER — SODIUM CHLORIDE 0.9% FLUSH
10.0000 mL | INTRAVENOUS | Status: DC | PRN
  Administered 2021-10-02: 10 mL

## 2021-10-02 MED ORDER — SODIUM CHLORIDE 0.9 % IV SOLN
10.0000 mg | Freq: Once | INTRAVENOUS | Status: AC
  Administered 2021-10-02: 10 mg via INTRAVENOUS
  Filled 2021-10-02: qty 10

## 2021-10-02 MED ORDER — SODIUM CHLORIDE 0.9% FLUSH
10.0000 mL | Freq: Once | INTRAVENOUS | Status: AC
  Administered 2021-10-02: 10 mL

## 2021-10-02 MED ORDER — SODIUM CHLORIDE 0.9 % IV SOLN: Freq: Once | INTRAVENOUS | Status: AC

## 2021-10-02 MED ORDER — SODIUM CHLORIDE 0.9 % IV SOLN
420.0000 mg | Freq: Once | INTRAVENOUS | Status: AC
  Administered 2021-10-02: 420 mg via INTRAVENOUS
  Filled 2021-10-02: qty 14

## 2021-10-02 MED ORDER — HEPARIN SOD (PORK) LOCK FLUSH 100 UNIT/ML IV SOLN
500.0000 [IU] | Freq: Once | INTRAVENOUS | Status: AC | PRN
  Administered 2021-10-02: 500 [IU]

## 2021-10-02 MED ORDER — DIPHENHYDRAMINE HCL 25 MG PO CAPS
25.0000 mg | ORAL_CAPSULE | Freq: Once | ORAL | Status: AC
  Administered 2021-10-02: 25 mg via ORAL
  Filled 2021-10-02: qty 1

## 2021-10-02 MED ORDER — TRASTUZUMAB-ANNS CHEMO 150 MG IV SOLR
6.0000 mg/kg | Freq: Once | INTRAVENOUS | Status: AC
  Administered 2021-10-02: 693 mg via INTRAVENOUS
  Filled 2021-10-02: qty 33

## 2021-10-02 NOTE — Progress Notes (Signed)
[facility]  PROGRESS NOTE  Patient Care Team: Leeanne Rio, MD as PCP - General (Family Medicine) Harl Bowie, Alphonse Guild, MD as PCP - Cardiology (Cardiology) Patient, No Pcp Per (General Practice) Mauro Kaufmann, RN as Oncology Nurse Navigator Rockwell Germany, RN as Oncology Nurse Navigator [name], MD as Consulting Physician (Hematology and Oncology)  CHIEF COMPLAINTS/PURPOSE OF CONSULTATION:  Metastatic breast cancer  SUMMARY OF ONCOLOGIC HISTORY: Oncology History  Inflammatory breast cancer, left ([place])  06/23/2021 Mammogram   Suspicious mass in the left breast at 2 o'clock measuring 1.8 cm. There is associated diffuse left breast skin thickening and increased trabeculation concerning for inflammatory breast cancer. There are at least 5 abnormal left axillary lymph nodes. 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   07/11/2021 Pathology Results   Left needle core biopsy from 2 o'clock position showed invasive mammary carcinoma, grade 3, lymph node biopsy showed metastatic carcinoma associated with minimal residual lymphoid tissue.  Prognostic markers could not be reported due to problems with tissue processing.  She will be undergoing repeat biopsy.   07/22/2021 Initial Diagnosis   Inflammatory breast cancer, left ([place])   07/28/2021 Cancer Staging   Staging form: Breast, AJCC 8th Edition - Clinical: Stage IB (cT2, cN1, cM0, G3, ER+, PR+, HER2+) - Signed by [name], MD on 07/28/2021 Histologic grading system: 3 grade system   07/29/2021 Imaging   CT chest abdomen pelvis with contrast for staging for inflammatory breast cancer showed diffuse marrow heterogeneity with scattered ill-defined sclerotic lesions and underlying extensive thoracolumbar spondylosis.  Based on today's bone scan early osseous metastatic disease cannot be excluded.  No other evidence of metastatic disease in the chest abdomen or pelvis.  Known inflammatory left  breast cancer with associated asymmetric left axillary lymph nodes.  Bone scan also confirmed heterogeneous osseous uptake throughout the thoracolumbar spine and multiple ribs suspicious for metastatic disease.  Consider further evaluation with PET/CT or MRI of the thoracolumbar spine.   08/01/2021 -  Chemotherapy   Patient is on Treatment Plan : BREAST  Docetaxel + Trastuzumab + Pertuzumab  (TCHP) q21d        HISTORY OF PRESENTING ILLNESS:   Nicole Cabrera returns for a follow up after undergoing cycle 3 of taxotere, trastuzumab and pertuzumab.  After last cycle of chemotherapy, she came for an interim visit, according to family she was spending most of the time sleeping, not eating well and feeling very poorly.  I have recommended just continuing with antibody since she did not tolerate chemotherapy well.  She is here for follow-up before cycle 4 She continues to feel fatigued. She says she feels a little depressed because her younger daughter is having some depression issues. She had some diarrhea about 3 days ago, relieved with imodium Nausea and vomiting are better. 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   Dyspnea    Dysrhythmia    Endometrial polyp 04/2000   Fall 01/2018   nose fracture    Heart  murmur    Hemorrhoids    History of kidney stones    Hypertension    Impaired glucose tolerance    Obesity    Osteopenia 05/2007   Paroxysmal atrial fibrillation (HCC)    Peripheral neuropathy    PMB (postmenopausal bleeding) 04/2000   Pneumonia    Pre-diabetes    SAB (spontaneous abortion) 1981   Sleep apnea    CPAP    Stroke ([place])    no lasting weakness or paralysis, does have some short term memory loss   SVD (spontaneous vaginal delivery) 04/1964, 05/1978   x3   TIA (transient ischemic attack)     SURGICAL HISTORY: Past Surgical History:  Procedure Laterality Date   BREAST BIOPSY Right 06/2020   x2   CATARACT EXTRACTION Right 12/07/2018   CERVICAL [place] SURGERY  04/1991   CESAREAN SECTION  08/1981   with BTL   COLONOSCOPY N/A 03/19/2017   Procedure: COLONOSCOPY;  Surgeon: Doran Stabler, MD;  Location: [facility];  Service: Gastroenterology;  Laterality: N/A;   DILATION AND CURETTAGE OF UTERUS     HYSTEROSCOPY  5/02, 01/2011   JOINT REPLACEMENT     LITHOTRIPSY  08/30/2014   with Dr. 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   Patient is a homemaker.   Education- College   Right handed.   Caffeine- one coke cola          Social Determinants of Health   Financial Resource Strain: Not on  file  Food Insecurity: Not on file  Transportation Needs: Not on file  Physical Activity: Not on file  Stress: Not on file  Social Connections: Not on file  Intimate Partner Violence: Not At Risk (07/31/2021)   Humiliation, Afraid, Rape, and Kick questionnaire    Fear of Current or Ex-Partner: No    Emotionally Abused: No    Physically Abused: No    Sexually Abused: No    FAMILY HISTORY: Family History  Problem Relation Age of Onset   Colon cancer Mother 44 - 59   Heart disease Mother    Heart disease Father    Breast cancer Maternal Aunt 30 - 39   Colon cancer Maternal Aunt 80 - 89   Colon cancer Maternal Uncle 34   Cancer Paternal Grandfather  unknown type   Ehlers-Danlos syndrome Other        neice-being tested 4/15   Breast cancer Other    Stomach cancer Neg Hx    Pancreatic cancer Neg Hx    Endometrial cancer Neg Hx    Ovarian cancer Neg Hx     ALLERGIES:  is allergic to bactrim [sulfamethoxazole-trimethoprim], ciprofloxacin, hydroxyzine hcl, keflet [cephalexin], lipitor [atorvastatin], lisinopril, spironolactone, vistaril [hydroxyzine hcl], erythromycin, latex, penicillins, tape, and tetracycline.  MEDICATIONS:  Current Outpatient Medications  Medication Sig Dispense Refill   acetaminophen (TYLENOL) 500 MG tablet Take 1,000 mg by mouth every 6 (six) hours as needed for moderate pain or headache.      albuterol (PROAIR HFA) 108 (90 Base) MCG/ACT inhaler INHALE TWO PUFFS BY MOUTH EVERY 6 HOURS AS NEEDED FOR WHEEZING AND SHORTNESS OF BREATH 18 g 12   ALPRAZolam (XANAX) 0.25 MG tablet Take 0.25 mg by mouth daily as needed (peripheral neuropathy).     chlorthalidone (HYGROTON) 25 MG tablet TAKE ONE TABLET BY MOUTH DAILY. 90 tablet 3   cholecalciferol (VITAMIN D) 1000 UNITS tablet Take 1,000 Units by mouth every morning.      dexamethasone (DECADRON) 4 MG tablet Take 2 tablets (8 mg total) by mouth 2 (two) times daily. Start the day before Taxotere. Then take daily x  3 days after chemotherapy. 30 tablet 1   guaiFENesin (MUCINEX) 600 MG 12 hr tablet Take 600 mg by mouth daily as needed for cough.     hydrocortisone cream 1 % Apply 1 application  topically daily. Mixed with clotrimazole cream. Washing in nizoral shampoo Applying 50/50 mix to lower abdomen     lidocaine-prilocaine (EMLA) cream Apply to affected area once 30 g 3   losartan (COZAAR) 100 MG tablet TAKE ONE TABLET BY MOUTH DAILY. 90 tablet 3   metoprolol tartrate (LOPRESSOR) 25 MG tablet TAKE ONE TABLET BY MOUTH TWICE DAILY, MAY TAKE ONE EXTRA TABLET AS NEEDED FOR PALPITATIONS 75 tablet 3   Multiple Vitamins-Minerals (MULTIVITAMIN WITH MINERALS) tablet Take 1 tablet by mouth daily. Woman's 50 +     nystatin (MYCOSTATIN/NYSTOP) powder Apply 1 application  topically 3 (three) times daily. 15 g 3   ondansetron (ZOFRAN) 8 MG tablet Take 1 tablet (8 mg total) by mouth 2 (two) times daily as needed (Nausea or vomiting). Start on the third day after chemotherapy. 30 tablet 1   Polyethyl Glycol-Propyl Glycol (SYSTANE OP) Place 1 drop into both eyes daily as needed (dry eyes).      potassium chloride SA (KLOR-CON) 20 MEQ tablet TAKE ONE TABLET BY MOUTH DAILY. 90 tablet 3   prochlorperazine (COMPAZINE) 10 MG tablet Take 1 tablet (10 mg total) by mouth every 6 (six) hours as needed (Nausea or vomiting). 30 tablet 1   sodium chloride (OCEAN) 0.65 % SOLN nasal spray Place 1 spray into both nostrils as needed for congestion.     No current facility-administered medications for this visit.   Facility-Administered Medications Ordered in Other Visits  Medication Dose Route Frequency Provider Last Rate Last Admin   0.9 %  sodium chloride infusion   Intravenous Once Joe Tanney, Arletha Pili, MD       acetaminophen (TYLENOL) tablet 650 mg  650 mg Oral Once Alfard Cochrane, Arletha Pili, MD       dexamethasone (DECADRON) 10 mg in sodium chloride 0.9 % 50 mL IVPB  10 mg Intravenous Once Alasdair Kleve, MD       diphenhydrAMINE (BENADRYL)  capsule 25 mg  25 mg Oral  Once [name], MD       heparin lock flush 100 unit/mL  500 Units Intracatheter Once PRN Lakyia Behe, Arletha Pili, MD       pertuzumab (PERJETA) 420 mg in sodium chloride 0.9 % 250 mL chemo infusion  420 mg Intravenous Once Kelcie Currie, MD       sodium chloride flush (NS) 0.9 % injection 10 mL  10 mL Intracatheter PRN Amare Bail, Arletha Pili, MD       trastuzumab-anns (KANJINTI) 693 mg in sodium chloride 0.9 % 250 mL chemo infusion  6 mg/kg (Treatment Plan Recorded) Intravenous Once Jory Tanguma, Arletha Pili, MD        REVIEW OF SYSTEMS:   Constitutional: Denies fevers, chills or abnormal night sweats Eyes: Denies blurriness of vision, double vision or watery eyes Ears, nose, mouth, throat, and face: Denies mucositis or sore throat Respiratory: Denies cough, dyspnea or wheezes Cardiovascular: Denies palpitation, chest discomfort or lower extremity swelling Gastrointestinal:  Denies nausea, heartburn or change in bowel habits Skin: Denies abnormal skin rashes Lymphatics: Denies new lymphadenopathy or easy bruising Neurological:Denies numbness, tingling or new weaknesses Behavioral/Psych: Mood is stable, no new changes  Breast: Improved redness and swelling of the left breast All other systems were reviewed with the patient and are negative.  PHYSICAL EXAMINATION: ECOG PERFORMANCE STATUS: 0 - Asymptomatic  Vitals:   10/02/21 1004  BP: 138/85  Pulse: 72  Resp: 15  Temp: (!) 97.2 F (36.2 C)  SpO2: 91%      Filed Weights   10/02/21 1004  Weight: 235 lb 3.2 oz (106.7 kg)    Physical Exam Constitutional:      Appearance: Normal appearance.     Comments: Appears better than last visit  Cardiovascular:     Rate and Rhythm: Normal rate and regular rhythm.     Pulses: Normal pulses.     Heart sounds: Normal heart sounds.  Chest:     Comments: Left breast with significant improvement since last visit. Erythema has resolved. Texture is consistent with less  edema. Musculoskeletal:        General: No swelling or tenderness.     Cervical back: Normal range of motion and neck supple. No rigidity.  Lymphadenopathy:     Cervical: No cervical adenopathy.  Skin:    General: Skin is warm and dry.  Neurological:     Mental Status: She is alert.      LABORATORY DATA:  I have reviewed the data as listed Lab Results  Component Value Date   WBC 7.8 10/02/2021   HGB 10.7 (L) 10/02/2021   HCT 32.5 (L) 10/02/2021   MCV 88.8 10/02/2021   PLT 355 10/02/2021   Lab Results  Component Value Date   NA 139 10/02/2021   K 3.3 (L) 10/02/2021   CL 105 10/02/2021   CO2 27 10/02/2021    RADIOGRAPHIC STUDIES: I have personally reviewed the radiological reports and agreed with the findings in the report.  ASSESSMENT AND PLAN:  Nicole Cabrera is a 76 y.o. female returns for a follow up for inflammatory breast cancer.   #Metastatic left sided inflammatory breast cancer, grade 3, HER2 positive: --Baseline echo from 06/17/2021 with EF 65-70%, next one due in June 2023.  Most recent echo with slight drop in ejection fraction of 60 to 65%.  We will continue to monitor --Started systemic chemotherapy with taxotere, trastuzumab and pertuzumab on 08/01/2021.  --PET scan from 08/13/2021 showed widespread metastatic disease involving the bone. --MRI brain at baseline without any  intracranial metastatic disease. --Patient had tremendous difficulty with chemotherapy with worsening nausea, fatigue, diarrhea despite multiple dose reductions hence have recommended to proceed with the Herceptin and pertuzumab alone.  Premedications adjusted accordingly. --We will also consider an interim scan to evaluate response, this has been ordered but not scheduled --Clinically, she had tremendous response in the left breast, erythema has resolved, texture has improved consistent with less edema overall indicating response. She will hopefully have repeat imaging before her next  cycle of Herceptin and pertuzumab  #Appetite loss/weight loss:  --Recommended to take nausea medications as instructed. --- Recommended eating 3 meals a day, she can consider energy supplements  #Diarrhea:   --Recommend to continue to take imodium as needed, last loose bowel movement was 3 days ago, grade 1 diarrhea overall  # CINV, continue zofran and compazine as instructed.  Follow up:  Return to clinic in 3 weeks, already scheduled  All questions were answered. The patient knows to call the clinic with any problems, questions or concerns.  I have spent a total of 40 minutes minutes of face-to-face and non-face-to-face time, preparing to see the patient,  performing a medically appropriate examination, counseling and educating the patient,  referring and communicating with other health care professionals, documenting clinical information in the electronic health record, and care coordination.  Spoke to her daughter over the phone as well

## 2021-10-02 NOTE — Patient Instructions (Addendum)
[facility]  Discharge Instructions: Thank you for choosing [facility] to provide your oncology and hematology care.   If you have a lab appointment with the [facility], please go directly to the [facility] and check in at the registration area.   Wear comfortable clothing and clothing appropriate for easy access to any Portacath or PICC line.   We strive to give you quality time with your provider. You may need to reschedule your appointment if you arrive late (15 or more minutes).  Arriving late affects you and other patients whose appointments are after yours.  Also, if you miss three or more appointments without notifying the office, you may be dismissed from the clinic at the provider's discretion.      For prescription refill requests, have your pharmacy contact our office and allow 72 hours for refills to be completed.    Today you received the following chemotherapy and/or immunotherapy agents: Pertuzumab (Perjeta), and Trastuzumab   To help prevent nausea and vomiting after your treatment, we encourage you to take your nausea medication as directed.  BELOW ARE SYMPTOMS THAT SHOULD BE REPORTED IMMEDIATELY: *FEVER GREATER THAN 100.4 F (38 C) OR HIGHER *CHILLS OR SWEATING *NAUSEA AND VOMITING THAT IS NOT CONTROLLED WITH YOUR NAUSEA MEDICATION *UNUSUAL SHORTNESS OF BREATH *UNUSUAL BRUISING OR BLEEDING *URINARY PROBLEMS (pain or burning when urinating, or frequent urination) *BOWEL PROBLEMS (unusual diarrhea, constipation, pain near the anus) TENDERNESS IN MOUTH AND THROAT WITH OR WITHOUT PRESENCE OF ULCERS (sore throat, sores in mouth, or a toothache) UNUSUAL RASH, SWELLING OR PAIN  UNUSUAL VAGINAL DISCHARGE OR ITCHING   Items with * indicate a potential emergency and should be followed up as soon as possible or go to the Emergency Department if any problems should occur.  Please show the CHEMOTHERAPY ALERT CARD or IMMUNOTHERAPY  ALERT CARD at check-in to the Emergency Department and triage nurse.  Should you have questions after your visit or need to cancel or reschedule your appointment, please contact [facility]  Dept: [phone]  and follow the prompts.  Office hours are 8:00 a.m. to 4:30 p.m. Monday - Friday. Please note that voicemails left after 4:00 p.m. may not be returned until the following business day.  We are closed weekends and major holidays. You have access to a nurse at all times for urgent questions. Please call the main number to the clinic Dept: [phone] and follow the prompts.   For any non-urgent questions, you may also contact your provider using MyChart. We now offer e-Visits for anyone 46 and older to request care online for non-urgent symptoms. For details visit [email or web address].   Also download the MyChart app! Go to the app store, search "MyChart", open the app, select [facility], and log in with your MyChart username and password.  Masks are optional in the cancer centers. If you would like for your care team to wear a mask while they are taking care of you, please let them know. For doctor visits, patients may have with them one support person who is at least 78 years old. At this time, visitors are not allowed in the infusion area.

## 2021-10-03 ENCOUNTER — Encounter: Payer: Self-pay | Admitting: *Deleted

## 2021-10-04 ENCOUNTER — Inpatient Hospital Stay: Payer: BC Managed Care – PPO

## 2021-10-08 ENCOUNTER — Ambulatory Visit (HOSPITAL_COMMUNITY)
Admission: RE | Admit: 2021-10-08 | Discharge: 2021-10-08 | Disposition: A | Discharge status: Home / Self Care | Payer: BC Managed Care – PPO | Source: Ambulatory Visit | Attending: Hematology and Oncology | Admitting: Hematology and Oncology

## 2021-10-08 DIAGNOSIS — C50912 Malignant neoplasm of unspecified site of left female breast: Secondary | ICD-10-CM | POA: Insufficient documentation

## 2021-10-08 MED ORDER — IOHEXOL 300 MG/ML  SOLN
100.0000 mL | Freq: Once | INTRAMUSCULAR | Status: AC | PRN
  Administered 2021-10-08: 100 mL via INTRAVENOUS

## 2021-10-13 ENCOUNTER — Other Ambulatory Visit: Payer: Self-pay

## 2021-10-15 ENCOUNTER — Telehealth: Payer: Self-pay | Admitting: Cardiology

## 2021-10-15 NOTE — Telephone Encounter (Signed)
Pt c/o medication issue:  1. Name of Medication:   potassium chloride SA (KLOR-CON) 20 MEQ tablet  2. How are you currently taking this medication (dosage and times per day)?   As prescribed  3. Are you having a reaction (difficulty breathing--STAT)? No  4. What is your medication issue?   Caller stated patient is having a hard time swallowing these pills and would like to switch to the liquid version.  Caller stated they would need a new prescription for the 8mq per 15 ml.

## 2021-10-16 ENCOUNTER — Encounter: Payer: Self-pay | Admitting: Genetic Counselor

## 2021-10-16 ENCOUNTER — Telehealth: Payer: Self-pay | Admitting: Genetic Counselor

## 2021-10-16 ENCOUNTER — Encounter: Payer: Self-pay | Admitting: Hematology and Oncology

## 2021-10-16 DIAGNOSIS — Z1379 Encounter for other screening for genetic and chromosomal anomalies: Secondary | ICD-10-CM | POA: Insufficient documentation

## 2021-10-16 MED ORDER — POTASSIUM CHLORIDE 20 MEQ/15ML (10%) PO SOLN: 20.0000 meq | Freq: Every day | ORAL | 6 refills | Status: AC

## 2021-10-16 NOTE — Telephone Encounter (Signed)
I attempted to contact Ms. Ron to discuss her genetic testing results (77 genes). She did not answer. I will call her again at a later date.   Lucille Passy, MS, [facility] Genetic Counselor [email or web address] (P) [phone]

## 2021-10-16 NOTE — Telephone Encounter (Signed)
Can try liquid, sometimes the liquid version is expensive but if price is not bad then fine to change.  Zandra Abts MD

## 2021-10-17 ENCOUNTER — Encounter: Payer: Self-pay | Admitting: Hematology and Oncology

## 2021-10-17 NOTE — Telephone Encounter (Signed)
Per most recent My Chart message from the patient's husband he stated concern with recurrence of "rash".  This RN called and spoke with Nicole Cabrera per above- rash is in the lower abd with area of where skin is on skin - she had episode before which they were able to get control of but now has recurred.  They are currently using nystatin powder.  This RN recommended that they clean the area bid with washing with dial antibacterial soap - rinsing well and drying. Then apply the nystatin powder - she should should lay a strip of 100%cotton to area so the skin is not resting on skin.  Nicole Cabrera verbalized understanding of above - as well as to call this RN next week with update.

## 2021-10-21 ENCOUNTER — Other Ambulatory Visit: Payer: Self-pay

## 2021-10-22 MED FILL — Dexamethasone Sodium Phosphate Inj 100 MG/10ML: INTRAMUSCULAR | Qty: 1 | Status: AC

## 2021-10-23 ENCOUNTER — Inpatient Hospital Stay: Payer: BC Managed Care – PPO | Attending: Hematology and Oncology | Admitting: Hematology and Oncology

## 2021-10-23 ENCOUNTER — Encounter: Payer: Self-pay | Admitting: Genetic Counselor

## 2021-10-23 ENCOUNTER — Encounter: Payer: Self-pay | Admitting: Hematology and Oncology

## 2021-10-23 ENCOUNTER — Other Ambulatory Visit: Payer: Self-pay

## 2021-10-23 ENCOUNTER — Inpatient Hospital Stay: Payer: BC Managed Care – PPO

## 2021-10-23 ENCOUNTER — Telehealth: Payer: Self-pay | Admitting: Genetic Counselor

## 2021-10-23 VITALS — BP 122/78 | HR 66 | Temp 97.4°F | Resp 18 | Ht 59.0 in | Wt 236.8 lb

## 2021-10-23 DIAGNOSIS — Z7189 Other specified counseling: Secondary | ICD-10-CM | POA: Diagnosis not present

## 2021-10-23 DIAGNOSIS — I10 Essential (primary) hypertension: Secondary | ICD-10-CM | POA: Insufficient documentation

## 2021-10-23 DIAGNOSIS — Z888 Allergy status to other drugs, medicaments and biological substances status: Secondary | ICD-10-CM | POA: Diagnosis not present

## 2021-10-23 DIAGNOSIS — I480 Paroxysmal atrial fibrillation: Secondary | ICD-10-CM | POA: Insufficient documentation

## 2021-10-23 DIAGNOSIS — Z8673 Personal history of transient ischemic attack (TIA), and cerebral infarction without residual deficits: Secondary | ICD-10-CM | POA: Diagnosis not present

## 2021-10-23 DIAGNOSIS — C7951 Secondary malignant neoplasm of bone: Secondary | ICD-10-CM | POA: Diagnosis not present

## 2021-10-23 DIAGNOSIS — Z87442 Personal history of urinary calculi: Secondary | ICD-10-CM | POA: Diagnosis not present

## 2021-10-23 DIAGNOSIS — Z880 Allergy status to penicillin: Secondary | ICD-10-CM | POA: Insufficient documentation

## 2021-10-23 DIAGNOSIS — Z8719 Personal history of other diseases of the digestive system: Secondary | ICD-10-CM | POA: Diagnosis not present

## 2021-10-23 DIAGNOSIS — N6489 Other specified disorders of breast: Secondary | ICD-10-CM | POA: Diagnosis not present

## 2021-10-23 DIAGNOSIS — R5383 Other fatigue: Secondary | ICD-10-CM | POA: Insufficient documentation

## 2021-10-23 DIAGNOSIS — C50912 Malignant neoplasm of unspecified site of left female breast: Secondary | ICD-10-CM | POA: Diagnosis not present

## 2021-10-23 DIAGNOSIS — Z79899 Other long term (current) drug therapy: Secondary | ICD-10-CM | POA: Insufficient documentation

## 2021-10-23 DIAGNOSIS — Z95828 Presence of other vascular implants and grafts: Secondary | ICD-10-CM

## 2021-10-23 DIAGNOSIS — Z170 Estrogen receptor positive status [ER+]: Secondary | ICD-10-CM | POA: Diagnosis not present

## 2021-10-23 DIAGNOSIS — G4730 Sleep apnea, unspecified: Secondary | ICD-10-CM | POA: Insufficient documentation

## 2021-10-23 DIAGNOSIS — C50412 Malignant neoplasm of upper-outer quadrant of left female breast: Secondary | ICD-10-CM | POA: Insufficient documentation

## 2021-10-23 DIAGNOSIS — K521 Toxic gastroenteritis and colitis: Secondary | ICD-10-CM | POA: Diagnosis not present

## 2021-10-23 DIAGNOSIS — T451X5A Adverse effect of antineoplastic and immunosuppressive drugs, initial encounter: Secondary | ICD-10-CM

## 2021-10-23 DIAGNOSIS — R197 Diarrhea, unspecified: Secondary | ICD-10-CM | POA: Diagnosis not present

## 2021-10-23 DIAGNOSIS — R112 Nausea with vomiting, unspecified: Secondary | ICD-10-CM | POA: Diagnosis not present

## 2021-10-23 DIAGNOSIS — Z881 Allergy status to other antibiotic agents status: Secondary | ICD-10-CM | POA: Diagnosis not present

## 2021-10-23 LAB — CMP (CANCER CENTER ONLY)
ALT: 16 U/L (ref 0–44)
AST: 19 U/L (ref 15–41)
Albumin: 2.9 g/dL — ABNORMAL LOW (ref 3.5–5.0)
Alkaline Phosphatase: 86 U/L (ref 38–126)
Anion gap: 5 (ref 5–15)
BUN: 12 mg/dL (ref 8–23)
CO2: 29 mmol/L (ref 22–32)
Calcium: 8.3 mg/dL — ABNORMAL LOW (ref 8.9–10.3)
Chloride: 106 mmol/L (ref 98–111)
Creatinine: 0.69 mg/dL (ref 0.44–1.00)
GFR, Estimated: >60 mL/min (ref >60)
Glucose, Bld: 112 mg/dL — ABNORMAL HIGH (ref 70–99)
Potassium: 3.1 mmol/L — ABNORMAL LOW (ref 3.5–5.1)
Sodium: 140 mmol/L (ref 135–145)
Total Bilirubin: 0.4 mg/dL (ref 0.3–1.2)
Total Protein: 5.3 g/dL — ABNORMAL LOW (ref 6.5–8.1)

## 2021-10-23 LAB — CBC WITH DIFFERENTIAL (CANCER CENTER ONLY)
Abs Immature Granulocytes: 0.04 10*3/uL (ref 0.00–0.07)
Basophils Absolute: 0.1 10*3/uL (ref 0.0–0.1)
Basophils Relative: 1 %
Eosinophils Absolute: 0.3 10*3/uL (ref 0.0–0.5)
Eosinophils Relative: 5 %
HCT: 32.2 % — ABNORMAL LOW (ref 36.0–46.0)
Hemoglobin: 10.3 g/dL — ABNORMAL LOW (ref 12.0–15.0)
Immature Granulocytes: 1 %
Lymphocytes Relative: 27 %
Lymphs Abs: 1.7 10*3/uL (ref 0.7–4.0)
MCH: 29.1 pg (ref 26.0–34.0)
MCHC: 32 g/dL (ref 30.0–36.0)
MCV: 91 fL (ref 80.0–100.0)
Monocytes Absolute: 0.5 10*3/uL (ref 0.1–1.0)
Monocytes Relative: 8 %
Neutro Abs: 3.6 10*3/uL (ref 1.7–7.7)
Neutrophils Relative %: 58 %
Platelet Count: 201 10*3/uL (ref 150–400)
RBC: 3.54 MIL/uL — ABNORMAL LOW (ref 3.87–5.11)
RDW: 16.5 % — ABNORMAL HIGH (ref 11.5–15.5)
WBC Count: 6.3 10*3/uL (ref 4.0–10.5)
nRBC: 0 % (ref 0.0–0.2)

## 2021-10-23 MED ORDER — SODIUM CHLORIDE 0.9% FLUSH
10.0000 mL | Freq: Once | INTRAVENOUS | Status: AC
  Administered 2021-10-23: 10 mL

## 2021-10-23 NOTE — Progress Notes (Signed)
[place]  PROGRESS NOTE  Patient Care Team: Leeanne Rio, MD as PCP - General (Family Medicine) Harl Bowie, Alphonse Guild, MD as PCP - Cardiology (Cardiology) Patient, No Pcp Per (General Practice) Mauro Kaufmann, RN as Oncology Nurse Navigator Rockwell Germany, RN as Oncology Nurse Navigator [name], MD as Consulting Physician (Hematology and Oncology)  CHIEF COMPLAINTS/PURPOSE OF CONSULTATION:  Metastatic breast cancer  SUMMARY OF ONCOLOGIC HISTORY: Oncology History  Inflammatory breast cancer, left ([place])  06/23/2021 Mammogram   Suspicious mass in the left breast at 2 o'clock measuring 1.8 cm. There is associated diffuse left breast skin thickening and increased trabeculation concerning for inflammatory breast cancer. There are at least 5 abnormal left axillary lymph nodes. 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   07/11/2021 Pathology Results   Left needle core biopsy from 2 o'clock position showed invasive mammary carcinoma, grade 3, lymph node biopsy showed metastatic carcinoma associated with minimal residual lymphoid tissue.  Prognostic markers could not be reported due to problems with tissue processing.  She will be undergoing repeat biopsy.   07/22/2021 Initial Diagnosis   Inflammatory breast cancer, left ([place])   07/28/2021 Cancer Staging   Staging form: Breast, AJCC 8th Edition - Clinical: Stage IB (cT2, cN1, cM0, G3, ER+, PR+, HER2+) - Signed by [name], MD on 07/28/2021 Histologic grading system: 3 grade system   07/29/2021 Imaging   CT chest abdomen pelvis with contrast for staging for inflammatory breast cancer showed diffuse marrow heterogeneity with scattered ill-defined sclerotic lesions and underlying extensive thoracolumbar spondylosis.  Based on today's bone scan early osseous metastatic disease cannot be excluded.  No other evidence of metastatic disease in the chest abdomen or pelvis.  Known inflammatory left  breast cancer with associated asymmetric left axillary lymph nodes.  Bone scan also confirmed heterogeneous osseous uptake throughout the thoracolumbar spine and multiple ribs suspicious for metastatic disease.  Consider further evaluation with PET/CT or MRI of the thoracolumbar spine.   08/01/2021 -  Chemotherapy   Patient is on Treatment Plan : BREAST  Docetaxel + Trastuzumab + Pertuzumab  (TCHP) q21d       Genetic Testing   [facility] CancerNext-Expanded Panel was Negative. Report date is 10/14/2021.  The CancerNext-Expanded gene panel offered by [facility] and includes sequencing, rearrangement, and RNA analysis for the following 77 genes: AIP, ALK, APC, ATM, AXIN2, BAP1, BARD1, BLM, BMPR1A, BRCA1, BRCA2, BRIP1, CDC73, CDH1, CDK4, CDKN1B, CDKN2A, CHEK2, CTNNA1, DICER1, FANCC, FH, FLCN, GALNT12, KIF1B, LZTR1, MAX, MEN1, MET, MLH1, MSH2, MSH3, MSH6, MUTYH, NBN, NF1, NF2, NTHL1, PALB2, PHOX2B, PMS2, POT1, PRKAR1A, PTCH1, PTEN, RAD51C, RAD51D, RB1, RECQL, RET, SDHA, SDHAF2, SDHB, SDHC, SDHD, SMAD4, SMARCA4, SMARCB1, SMARCE1, STK11, SUFU, TMEM127, TP53, TSC1, TSC2, VHL and XRCC2 (sequencing and deletion/duplication); EGFR, EGLN1, HOXB13, KIT, MITF, PDGFRA, POLD1, and POLE (sequencing only); EPCAM and GREM1 (deletion/duplication only).      HISTORY OF PRESENTING ILLNESS:   Nicole Cabrera returns for a follow up She got 3 cycles of chemo/herceptin/perjeta. For the 4 th round, we omitted chemotherapy.  She says the left breast has gotten bigger in the past week which was a quick change. She is still very fatigued, eats slightly better but overall does not really have the energy to do anything.  She can barely carry her purse at this point. She once again asks about the role of surgery. 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   Dyspnea    Dysrhythmia    Endometrial polyp 04/2000   Fall 01/2018   nose fracture    Heart murmur    Hemorrhoids    History of kidney stones    Hypertension    Impaired glucose tolerance    Obesity    Osteopenia 05/2007   Paroxysmal atrial fibrillation ([place])    Peripheral neuropathy    PMB (postmenopausal bleeding) 04/2000   Pneumonia    Pre-diabetes    SAB (spontaneous abortion) 1981   Sleep apnea    CPAP   Stroke ([place])    no lasting weakness or paralysis, does have some short term memory loss   SVD (spontaneous vaginal delivery) 04/1964, 05/1978   x3   TIA (transient ischemic attack)     SURGICAL HISTORY: Past Surgical History:  Procedure Laterality Date   BREAST BIOPSY Right 06/2020   x2   CATARACT EXTRACTION Right 12/07/2018   CERVICAL [place] SURGERY  04/1991   CESAREAN SECTION  08/1981   with BTL   COLONOSCOPY N/A 03/19/2017   Procedure: COLONOSCOPY;  Surgeon: Doran Stabler, MD;  Location: [facility];  Service: Gastroenterology;  Laterality: N/A;   DILATION AND CURETTAGE OF UTERUS     HYSTEROSCOPY  5/02, 01/2011   JOINT REPLACEMENT     LITHOTRIPSY  08/30/2014   with Dr. 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   Patient is a homemaker.   Education- College   Right handed.   Caffeine- one coke cola          Social Determinants of Health   Financial Resource Strain: Not on file  Food Insecurity: Not on file  Transportation Needs: Not on file  Physical Activity: Not on file  Stress: Not on file  Social Connections: Not on file  Intimate Partner Violence: Not At Risk (07/31/2021)   Humiliation, Afraid, Rape, and Kick questionnaire  Fear of Current or Ex-Partner: No    Emotionally Abused: No    Physically Abused: No    Sexually Abused: No    FAMILY HISTORY: Family History  Problem Relation Age of Onset   Colon cancer Mother 45 - 12   Heart disease Mother    Heart disease Father    Breast cancer Maternal Aunt 30 - 39   Colon cancer Maternal Aunt 80 - 89   Colon cancer Maternal Uncle 79   Cancer Paternal Grandfather        unknown type   Ehlers-Danlos syndrome Other        neice-being tested 4/15   Breast cancer Other    Stomach cancer Neg Hx    Pancreatic cancer Neg Hx    Endometrial cancer Neg Hx    Ovarian cancer Neg Hx     ALLERGIES:  is allergic to bactrim [sulfamethoxazole-trimethoprim], ciprofloxacin, hydroxyzine hcl, keflet [cephalexin], lipitor [atorvastatin], lisinopril, spironolactone, vistaril [hydroxyzine hcl], erythromycin, latex, penicillins, tape, and tetracycline.  MEDICATIONS:  Current Outpatient Medications  Medication Sig Dispense Refill   acetaminophen (TYLENOL) 500 MG tablet Take 1,000 mg by mouth every 6 (six)  hours as needed for moderate pain or headache.      albuterol (PROAIR HFA) 108 (90 Base) MCG/ACT inhaler INHALE TWO PUFFS BY MOUTH EVERY 6 HOURS AS NEEDED FOR WHEEZING AND SHORTNESS OF BREATH 18 g 12   ALPRAZolam (XANAX) 0.25 MG tablet Take 0.25 mg by mouth daily as needed (peripheral neuropathy).     chlorthalidone (HYGROTON) 25 MG tablet TAKE ONE TABLET BY MOUTH DAILY. 90 tablet 3   cholecalciferol (VITAMIN D) 1000 UNITS tablet Take 1,000 Units by mouth every morning.      dexamethasone (DECADRON) 4 MG tablet Take 2 tablets (8 mg total) by mouth 2 (two) times daily. Start the day before Taxotere. Then take daily x 3 days after chemotherapy. 30 tablet 1   guaiFENesin (MUCINEX) 600 MG 12 hr tablet Take 600 mg by mouth daily as needed for cough.     hydrocortisone cream 1 % Apply 1 application  topically daily. Mixed with clotrimazole cream. Washing in nizoral shampoo Applying 50/50 mix to lower abdomen     lidocaine-prilocaine (EMLA) cream Apply to affected area once 30 g 3   losartan (COZAAR) 100 MG tablet TAKE ONE TABLET BY MOUTH DAILY. 90 tablet 3   metoprolol tartrate (LOPRESSOR) 25 MG tablet TAKE ONE TABLET BY MOUTH TWICE DAILY, MAY TAKE ONE EXTRA TABLET AS NEEDED FOR PALPITATIONS 75 tablet 3   Multiple Vitamins-Minerals (MULTIVITAMIN WITH MINERALS) tablet Take 1 tablet by mouth daily. Woman's 50 +     nystatin (MYCOSTATIN/NYSTOP) powder Apply 1 application  topically 3 (three) times daily. 15 g 3   ondansetron (ZOFRAN) 8 MG tablet Take 1 tablet (8 mg total) by mouth 2 (two) times daily as needed (Nausea or vomiting). Start on the third day after chemotherapy. 30 tablet 1   Polyethyl Glycol-Propyl Glycol (SYSTANE OP) Place 1 drop into both eyes daily as needed (dry eyes).      potassium chloride 20 MEQ/15ML (10%) SOLN Take 15 mLs (20 mEq total) by mouth daily. 450 mL 6   potassium chloride SA (KLOR-CON) 20 MEQ tablet TAKE ONE TABLET BY MOUTH DAILY. 90 tablet 3   prochlorperazine (COMPAZINE) 10  MG tablet Take 1 tablet (10 mg total) by mouth every 6 (six) hours as needed (Nausea or vomiting). 30 tablet 1   sodium chloride (OCEAN) 0.65 % SOLN nasal spray  Place 1 spray into both nostrils as needed for congestion.     No current facility-administered medications for this visit.    REVIEW OF SYSTEMS:   Constitutional: Denies fevers, chills or abnormal night sweats Eyes: Denies blurriness of vision, double vision or watery eyes Ears, nose, mouth, throat, and face: Denies mucositis or sore throat Respiratory: Denies cough, dyspnea or wheezes Cardiovascular: Denies palpitation, chest discomfort or lower extremity swelling Gastrointestinal:  Denies nausea, heartburn or change in bowel habits Skin: Denies abnormal skin rashes Lymphatics: Denies new lymphadenopathy or easy bruising Neurological:Denies numbness, tingling or new weaknesses Behavioral/Psych: Mood is stable, no new changes  Breast: Improved redness and swelling of the left breast All other systems were reviewed with the patient and are negative.  PHYSICAL EXAMINATION: ECOG PERFORMANCE STATUS: 0 - Asymptomatic  Vitals:   10/23/21 0927  BP: 122/78  Pulse: 66  Resp: 18  Temp: (!) 97.4 F (36.3 C)  SpO2: 92%      Filed Weights   10/23/21 0927  Weight: 236 lb 12.8 oz (107.4 kg)    Physical Exam Constitutional:      Appearance: She is ill-appearing.     Comments: She is in a wheelchair  Chest:     Comments: Left breast appears more edematous.  Musculoskeletal:        General: No swelling or tenderness.  Skin:    General: Skin is warm and dry.  Neurological:     Mental Status: She is alert.      LABORATORY DATA:  I have reviewed the data as listed Lab Results  Component Value Date   WBC 6.3 10/23/2021   HGB 10.3 (L) 10/23/2021   HCT 32.2 (L) 10/23/2021   MCV 91.0 10/23/2021   PLT 201 10/23/2021   Lab Results  Component Value Date   NA 139 10/02/2021   K 3.3 (L) 10/02/2021   CL 105 10/02/2021    CO2 27 10/02/2021    RADIOGRAPHIC STUDIES: I have personally reviewed the radiological reports and agreed with the findings in the report.  ASSESSMENT AND PLAN:  Nicole Cabrera is a 76 y.o. female returns for a follow up for inflammatory breast cancer.   #Metastatic left sided inflammatory breast cancer, grade 3, HER2 positive: --Baseline echo from 06/17/2021 with EF 65-70%, next one due in June 2023.  Most recent echo with slight drop in ejection fraction of 60 to 65%.  We will continue to monitor --Started systemic chemotherapy with taxotere, trastuzumab and pertuzumab on 08/01/2021.  --PET scan from 08/13/2021 showed widespread metastatic disease involving the bone. --MRI brain at baseline without any intracranial metastatic disease. --Patient had tremendous difficulty with chemotherapy with worsening nausea, fatigue, diarrhea despite multiple dose reductions hence have recommended to proceed with the Herceptin and pertuzumab alone.  Premedications adjusted accordingly. --We will also consider an interim scan to evaluate response, this has been ordered but not scheduled --Clinically, she had tremendous response in the left breast, erythema has resolved, texture has improved consistent with less edema overall indicating response. --Imaging from July shows mixed response, improvement in left breast and left axilla however 1 lymph node appears larger.  Again with lung nodules one of the radiologic.  Mildly increased prominence of the bone lesions. --She however did not tolerate chemotherapy very well despite dose reductions.  In the past week she has once again noticed that the left breast swelling has suddenly gotten worse along with some redness concerning for progression of the disease.  Physical exam also  consistent with increased swelling of the left breast along with some skin changes again concerning for progressive disease.  Today we have discussed about considering second line Enhertu at  a lower dose versus palliative care and hospice.  The patient is very go back on any treatment that will make her feel poorly but she would like to think about it.  In case she chooses not to proceed with any further treatment, we have discussed in detail about role of palliative care and hospice.  We have had this discussion several times during her diagnosis.  Patient and her husband would like to think about it over the weekend.  We will do a telephone visit on Monday morning to review her decision. Discussed about not doing antibodies today since she may not be responding to Herceptin and pertuzumab alone.  #Diarrhea:   --Recommend to continue to take imodium as needed,   # CINV, continue zofran and compazine as instructed.  Follow up:  Telephone visit on Monday to review goals of care  All questions were answered. The patient knows to call the clinic with any problems, questions or concerns.  We have spent considerable amount of time discussing with suspect and without treatment, possible adverse effects of Enhertu, needs of care.  I have spent a total of 40 minutes minutes of face-to-face and non-face-to-face time, preparing to see the patient,  performing a medically appropriate examination, counseling and educating the patient,  referring and communicating with other health care professionals, documenting clinical information in the electronic health record, and care coordination.  Spoke to her daughter over the phone as well

## 2021-10-23 NOTE — Telephone Encounter (Signed)
I attempted to contact Nicole Cabrera to discuss her genetic testing results (77 genes). She did not answer. I will call her again at a later date.    Lucille Passy, MS, [facility] Genetic Counselor [email or web address] (P) [phone]

## 2021-10-24 ENCOUNTER — Telehealth: Payer: Self-pay | Admitting: Hematology and Oncology

## 2021-10-24 NOTE — Telephone Encounter (Signed)
Scheduled appointment per 8/3 los. Patient is aware.

## 2021-10-25 ENCOUNTER — Inpatient Hospital Stay: Payer: BC Managed Care – PPO

## 2021-10-27 ENCOUNTER — Encounter: Payer: Self-pay | Admitting: Hematology and Oncology

## 2021-10-27 ENCOUNTER — Inpatient Hospital Stay (HOSPITAL_BASED_OUTPATIENT_CLINIC_OR_DEPARTMENT_OTHER): Payer: BC Managed Care – PPO | Admitting: Hematology and Oncology

## 2021-10-27 DIAGNOSIS — C50912 Malignant neoplasm of unspecified site of left female breast: Secondary | ICD-10-CM

## 2021-10-27 NOTE — Progress Notes (Signed)
Nicole Cabrera  PROGRESS NOTE  Patient Care Team: Leeanne Rio, MD as PCP - General (Family Medicine) Harl Bowie, Alphonse Guild, MD as PCP - Cardiology (Cardiology) Patient, No Pcp Per (General Practice) Mauro Kaufmann, RN as Oncology Nurse Navigator Rockwell Germany, RN as Oncology Nurse Navigator [name], MD as Consulting Physician (Hematology and Oncology)  CHIEF COMPLAINTS/PURPOSE OF CONSULTATION:  Metastatic breast cancer  SUMMARY OF ONCOLOGIC HISTORY: Oncology History  Inflammatory breast cancer, left ([place])  06/23/2021 Mammogram   Suspicious mass in the left breast at 2 o'clock measuring 1.8 cm. There is associated diffuse left breast skin thickening and increased trabeculation concerning for inflammatory breast cancer. There are at least 5 abnormal left axillary lymph nodes. 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   07/11/2021 Pathology Results   Left needle core biopsy from 2 o'clock position showed invasive mammary carcinoma, grade 3, lymph node biopsy showed metastatic carcinoma associated with minimal residual lymphoid tissue.  Prognostic markers could not be reported due to problems with tissue processing.  She will be undergoing repeat biopsy.   07/22/2021 Initial Diagnosis   Inflammatory breast cancer, left ([place])   07/28/2021 Cancer Staging   Staging form: Breast, AJCC 8th Edition - Clinical: Stage IB (cT2, cN1, cM0, G3, ER+, PR+, HER2+) - Signed by [name], MD on 07/28/2021 Histologic grading system: 3 grade system   07/29/2021 Imaging   CT chest abdomen pelvis with contrast for staging for inflammatory breast cancer showed diffuse marrow heterogeneity with scattered ill-defined sclerotic lesions and underlying extensive thoracolumbar spondylosis.  Based on today's bone scan early osseous metastatic disease cannot be excluded.  No other evidence of metastatic disease in the chest abdomen or pelvis.  Known inflammatory left  breast cancer with associated asymmetric left axillary lymph nodes.  Bone scan also confirmed heterogeneous osseous uptake throughout the thoracolumbar spine and multiple ribs suspicious for metastatic disease.  Consider further evaluation with PET/CT or MRI of the thoracolumbar spine.   08/01/2021 - 10/02/2021 Chemotherapy   Patient is on Treatment Plan : BREAST  Docetaxel + Trastuzumab + Pertuzumab  (TCHP) q21d       Genetic Testing   [facility] CancerNext-Expanded Panel was Negative. Report date is 10/14/2021.  The CancerNext-Expanded gene panel offered by [facility] and includes sequencing, rearrangement, and RNA analysis for the following 77 genes: AIP, ALK, APC, ATM, AXIN2, BAP1, BARD1, BLM, BMPR1A, BRCA1, BRCA2, BRIP1, CDC73, CDH1, CDK4, CDKN1B, CDKN2A, CHEK2, CTNNA1, DICER1, FANCC, FH, FLCN, GALNT12, KIF1B, LZTR1, MAX, MEN1, MET, MLH1, MSH2, MSH3, MSH6, MUTYH, NBN, NF1, NF2, NTHL1, PALB2, PHOX2B, PMS2, POT1, PRKAR1A, PTCH1, PTEN, RAD51C, RAD51D, RB1, RECQL, RET, SDHA, SDHAF2, SDHB, SDHC, SDHD, SMAD4, SMARCA4, SMARCB1, SMARCE1, STK11, SUFU, TMEM127, TP53, TSC1, TSC2, VHL and XRCC2 (sequencing and deletion/duplication); EGFR, EGLN1, HOXB13, KIT, MITF, PDGFRA, POLD1, and POLE (sequencing only); EPCAM and GREM1 (deletion/duplication only).      HISTORY OF PRESENTING ILLNESS:   Nicole Cabrera returns for a follow up via telephone She is still not sure about her decision. She wants to try more treatment but at the same time is worried about side effects and her quality of life.  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   Dyspnea    Dysrhythmia    Endometrial polyp 04/2000   Fall 01/2018    nose fracture    Heart murmur    Hemorrhoids    History of kidney stones    Hypertension    Impaired glucose tolerance    Obesity    Osteopenia 05/2007   Paroxysmal atrial fibrillation (HCC)    Peripheral neuropathy    PMB (postmenopausal bleeding) 04/2000   Pneumonia    Pre-diabetes    SAB (spontaneous abortion) 1981   Sleep apnea    CPAP   Stroke ([place])    no lasting weakness or paralysis, does have some short term memory loss   SVD (spontaneous vaginal delivery) 04/1964, 05/1978   x3   TIA (transient ischemic attack)     SURGICAL HISTORY: Past Surgical History:  Procedure Laterality Date   BREAST BIOPSY Right 06/2020   x2   CATARACT EXTRACTION Right 12/07/2018   CERVICAL [place] SURGERY  04/1991   CESAREAN SECTION  08/1981   with BTL   COLONOSCOPY N/A 03/19/2017   Procedure: COLONOSCOPY;  Surgeon: Doran Stabler, MD;  Location: [place];  Service: Gastroenterology;  Laterality: N/A;   DILATION AND CURETTAGE OF UTERUS     HYSTEROSCOPY  5/02, 01/2011   JOINT REPLACEMENT     LITHOTRIPSY  08/30/2014   with Dr. 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   Patient is a homemaker.   Education- College   Right handed.   Caffeine- one coke cola          Social Determinants of Health   Financial Resource Strain: Not on file  Food Insecurity: Not on file  Transportation Needs: Not on file  Physical Activity: Not on file  Stress: Not on file  Social Connections: Not on file  Intimate Partner Violence: Not At Risk (07/31/2021)   Humiliation, Afraid, Rape, and Kick questionnaire    Fear of Current or Ex-Partner: No    Emotionally Abused: No    Physically Abused: No    Sexually Abused: No    FAMILY HISTORY: Family History  Problem Relation Age of Onset   Colon cancer Mother 55 - 81  Heart disease Mother    Heart disease Father    Breast cancer Maternal Aunt 14 - 39   Colon cancer Maternal Aunt 80 - 89   Colon cancer Maternal Uncle 79   Cancer Paternal Grandfather        unknown type   Ehlers-Danlos syndrome Other        neice-being tested 4/15   Breast cancer Other    Stomach cancer Neg Hx    Pancreatic cancer Neg Hx    Endometrial cancer Neg Hx    Ovarian cancer Neg Hx     ALLERGIES:  is allergic to bactrim [sulfamethoxazole-trimethoprim], ciprofloxacin, hydroxyzine hcl, keflet [cephalexin], lipitor [atorvastatin], lisinopril, spironolactone, vistaril [hydroxyzine hcl], erythromycin, latex, penicillins, tape, and tetracycline.  MEDICATIONS:  Current Outpatient Medications  Medication Sig Dispense Refill   acetaminophen (TYLENOL) 500 MG tablet Take 1,000 mg by mouth every 6 (six) hours as needed for moderate pain or headache.      albuterol (PROAIR HFA) 108 (90 Base) MCG/ACT inhaler INHALE TWO PUFFS BY MOUTH EVERY 6 HOURS AS NEEDED FOR WHEEZING AND SHORTNESS OF BREATH 18 g 12   ALPRAZolam (XANAX) 0.25 MG tablet Take 0.25 mg by mouth daily as  needed (peripheral neuropathy).     chlorthalidone (HYGROTON) 25 MG tablet TAKE ONE TABLET BY MOUTH DAILY. 90 tablet 3   cholecalciferol (VITAMIN D) 1000 UNITS tablet Take 1,000 Units by mouth every morning.      guaiFENesin (MUCINEX) 600 MG 12 hr tablet Take 600 mg by mouth daily as needed for cough.     hydrocortisone cream 1 % Apply 1 application  topically daily. Mixed with clotrimazole cream. Washing in nizoral shampoo Applying 50/50 mix to lower abdomen     losartan (COZAAR) 100 MG tablet TAKE ONE TABLET BY MOUTH DAILY. 90 tablet 3   metoprolol tartrate (LOPRESSOR) 25 MG tablet TAKE ONE TABLET BY MOUTH TWICE DAILY, MAY TAKE ONE EXTRA TABLET AS NEEDED FOR PALPITATIONS 75 tablet 3   Multiple Vitamins-Minerals (MULTIVITAMIN WITH MINERALS) tablet Take 1 tablet by mouth daily. Woman's 50 +     nystatin (MYCOSTATIN/NYSTOP) powder Apply 1 application  topically 3 (three) times daily. 15 g 3   Polyethyl Glycol-Propyl Glycol (SYSTANE OP) Place 1 drop into both eyes daily as needed (dry eyes).      potassium chloride 20 MEQ/15ML (10%) SOLN Take 15 mLs (20 mEq total) by mouth daily. 450 mL 6   potassium chloride SA (KLOR-CON) 20 MEQ tablet TAKE ONE TABLET BY MOUTH DAILY. 90 tablet 3   sodium chloride (OCEAN) 0.65 % SOLN nasal spray Place 1 spray into both nostrils as needed for congestion.     No current facility-administered medications for this visit.    REVIEW OF SYSTEMS:   Constitutional: Denies fevers, chills or abnormal night sweats Eyes: Denies blurriness of vision, double vision or watery eyes Ears, nose, mouth, throat, and face: Denies mucositis or sore throat Respiratory: Denies cough, dyspnea or wheezes Cardiovascular: Denies palpitation, chest discomfort or lower extremity swelling Gastrointestinal:  Denies nausea, heartburn or change in bowel habits Skin: Denies abnormal skin rashes Lymphatics: Denies new lymphadenopathy or easy bruising Neurological:Denies numbness, tingling or new  weaknesses Behavioral/Psych: Mood is stable, no new changes  Breast: Improved redness and swelling of the left breast All other systems were reviewed with the patient and are negative.  PHYSICAL EXAMINATION: ECOG PERFORMANCE STATUS: 0 - Asymptomatic  There were no vitals filed for this visit.     There were no vitals filed  for this visit.   PE not done, telephone visit.   LABORATORY DATA:  I have reviewed the data as listed Lab Results  Component Value Date   WBC 6.3 10/23/2021   HGB 10.3 (L) 10/23/2021   HCT 32.2 (L) 10/23/2021   MCV 91.0 10/23/2021   PLT 201 10/23/2021   Lab Results  Component Value Date   NA 140 10/23/2021   K 3.1 (L) 10/23/2021   CL 106 10/23/2021   CO2 29 10/23/2021    RADIOGRAPHIC STUDIES: I have personally reviewed the radiological reports and agreed with the findings in the report.  ASSESSMENT AND PLAN:  LUNDON ROSIER is a 76 y.o. female returns for a follow up for inflammatory breast cancer.   #Metastatic left sided inflammatory breast cancer, grade 3, HER2 positive: --Baseline echo from 06/17/2021 with EF 65-70%, next one due in June 2023.  Most recent echo with slight drop in ejection fraction of 60 to 65%.  We will continue to monitor --Started systemic chemotherapy with taxotere, trastuzumab and pertuzumab on 08/01/2021.  --PET scan from 08/13/2021 showed widespread metastatic disease involving the bone. --MRI brain at baseline without any intracranial metastatic disease. --Patient had tremendous difficulty with chemotherapy with worsening nausea, fatigue, diarrhea despite multiple dose reductions hence have recommended to proceed with the Herceptin and pertuzumab alone.  Premedications adjusted accordingly. --We will also consider an interim scan to evaluate response, this has been ordered but not scheduled --Clinically, she had tremendous response in the left breast, erythema has resolved, texture has improved consistent with less  edema overall indicating response. --Imaging from July shows mixed response, improvement in left breast and left axilla however 1 lymph node appears larger.  Again with lung nodules one of the radiologic.  Mildly increased prominence of the bone lesions. --She however did not tolerate chemotherapy very well despite dose reductions.  In the past week she has once again noticed that the left breast swelling has suddenly gotten worse along with some redness concerning for progression of the disease.  Physical exam also consistent with increased swelling of the left breast along with some skin changes again concerning for progressive disease.  During our last visit, we have discussed about considering second line Enhertu at a lower dose versus palliative care and hospice.  She is here for a telephone visit. She once again had several questions about enhertu, mechanism of action, adverse effects including but not limited to fatigue, nausea and vomiting, cytopenias, diarrhea, cardiotoxicity, pneumonitis etc. She understands that some of the side effects can be life threatening and permanent. She once again wondered about prognosis with or without treatment, about her quality of life on hospice. She is not quite ready yet to make a decision. She was instructed to call us tomorrow with her decision  #Diarrhea:   --Recommend to continue to take imodium as needed,   # CINV, continue zofran and compazine as instructed.  Follow up:  Patient will call us with her decision  All questions were answered. The patient knows to call the clinic with any problems, questions or concerns.  We have spent considerable amount of time discussing with suspect and without treatment, possible adverse effects of Enhertu, needs of care.  I connected with  Nicole Cabrera on 10/27/21 by a telephone application and verified that I am speaking with the correct person using two identifiers.   I discussed the limitations of  evaluation and management by telemedicine. The patient expressed understanding and agreed to proceed.  [name] MD

## 2021-10-28 ENCOUNTER — Telehealth: Payer: Self-pay | Admitting: Genetic Counselor

## 2021-10-28 NOTE — Telephone Encounter (Signed)
I contacted Ms. Isham to discuss her genetic testing results. No pathogenic variants were identified in the 77 genes analyzed. Detailed clinic note to follow.  The test report has been scanned into EPIC and is located under the Molecular Pathology section of the Results Review tab.  A portion of the result report is included below for reference.   Lucille Passy, MS, [facility] Genetic Counselor [email or web address] (P) [phone]

## 2021-10-31 ENCOUNTER — Telehealth: Payer: Self-pay | Admitting: *Deleted

## 2021-10-31 NOTE — Telephone Encounter (Signed)
This RN spoke with pt per her call stating she was supposed to call earlier this week about decision to proceed with treatment or go with hospice. She met with hospice and understands their services.  She stated " I really haven't made a decision and am unsure about what to do" " If I didn't do the treatment how long would I live "  This RN informed above difficult to answer- but per her last scan result was mixed- discussed with her what that entailed. Discussed besides the lungs she does not have disease in other visceral organs. Reviewed issues of bone involvement which in itself is not terminal.  This RN reviewed issues of her tolerance to her prior chemo regimen and quality of life with goal of therapy ideally is to give her healthy with good quality living. Discussed possible benefit from chemo in that it can relieve symptoms of the cancer that we would otherwise have to use pain medication or other medications for. She also needs to understand that this office can alleviate side effects of chemo with post hydration and other regimens.   Reviewed enhertu therapy including if she decides to try this therapy and does not tolerate it well she can stop it. Again reiterated goal of quality of life.  Plan per call is she will discuss things with her family " I want to discuss this with my daughter " and she will call us on Monday.

## 2021-11-02 ENCOUNTER — Encounter: Payer: Self-pay | Admitting: Hematology and Oncology

## 2021-11-03 ENCOUNTER — Telehealth: Payer: Self-pay | Admitting: *Deleted

## 2021-11-03 NOTE — Telephone Encounter (Signed)
This RN contacted pt's husband,Michael, per his inbox message -discussed her status today with some noted improvement.  Per discussion this RN stated phone call on Friday with the patient she seemed to be in good spirits and feeling well with [name] stating she was and then on Saturday " she kind of over did it and went to her mom's and was looking for some tablecloths and moved several boxes "  This RN discussed over extending her energy on Saturday may be cause of increased fatigue on Sunday with some noted improvement today.  [name] will call this RN tomorrow with update on how pt is doing.

## 2021-11-04 ENCOUNTER — Inpatient Hospital Stay (HOSPITAL_COMMUNITY)
Admission: EM | Admit: 2021-11-04 | Discharge: 2021-11-21 | DRG: 023 | Disposition: E | Payer: BC Managed Care – PPO | Attending: Critical Care Medicine | Admitting: Critical Care Medicine

## 2021-11-04 ENCOUNTER — Emergency Department (HOSPITAL_COMMUNITY): Payer: BC Managed Care – PPO

## 2021-11-04 ENCOUNTER — Encounter (HOSPITAL_COMMUNITY): Admission: EM | Disposition: E | Payer: Self-pay | Source: Home / Self Care | Attending: Critical Care Medicine

## 2021-11-04 ENCOUNTER — Emergency Department (HOSPITAL_COMMUNITY): Payer: BC Managed Care – PPO | Admitting: Anesthesiology

## 2021-11-04 ENCOUNTER — Encounter: Payer: Self-pay | Admitting: Hematology and Oncology

## 2021-11-04 ENCOUNTER — Other Ambulatory Visit: Payer: Self-pay

## 2021-11-04 ENCOUNTER — Encounter (HOSPITAL_COMMUNITY): Payer: Self-pay | Admitting: Emergency Medicine

## 2021-11-04 ENCOUNTER — Other Ambulatory Visit (HOSPITAL_COMMUNITY): Payer: BC Managed Care – PPO

## 2021-11-04 ENCOUNTER — Inpatient Hospital Stay (HOSPITAL_COMMUNITY): Payer: BC Managed Care – PPO

## 2021-11-04 DIAGNOSIS — Z9911 Dependence on respirator [ventilator] status: Secondary | ICD-10-CM | POA: Diagnosis not present

## 2021-11-04 DIAGNOSIS — H547 Unspecified visual loss: Secondary | ICD-10-CM | POA: Diagnosis present

## 2021-11-04 DIAGNOSIS — I63411 Cerebral infarction due to embolism of right middle cerebral artery: Principal | ICD-10-CM | POA: Diagnosis present

## 2021-11-04 DIAGNOSIS — C7951 Secondary malignant neoplasm of bone: Secondary | ICD-10-CM | POA: Diagnosis present

## 2021-11-04 DIAGNOSIS — Z20822 Contact with and (suspected) exposure to covid-19: Secondary | ICD-10-CM | POA: Diagnosis present

## 2021-11-04 DIAGNOSIS — Z515 Encounter for palliative care: Secondary | ICD-10-CM | POA: Diagnosis not present

## 2021-11-04 DIAGNOSIS — R29724 NIHSS score 24: Secondary | ICD-10-CM | POA: Diagnosis not present

## 2021-11-04 DIAGNOSIS — R471 Dysarthria and anarthria: Secondary | ICD-10-CM | POA: Diagnosis present

## 2021-11-04 DIAGNOSIS — I63511 Cerebral infarction due to unspecified occlusion or stenosis of right middle cerebral artery: Principal | ICD-10-CM | POA: Diagnosis present

## 2021-11-04 DIAGNOSIS — H518 Other specified disorders of binocular movement: Secondary | ICD-10-CM | POA: Diagnosis present

## 2021-11-04 DIAGNOSIS — J9601 Acute respiratory failure with hypoxia: Secondary | ICD-10-CM | POA: Diagnosis not present

## 2021-11-04 DIAGNOSIS — E1122 Type 2 diabetes mellitus with diabetic chronic kidney disease: Secondary | ICD-10-CM | POA: Diagnosis present

## 2021-11-04 DIAGNOSIS — Z880 Allergy status to penicillin: Secondary | ICD-10-CM

## 2021-11-04 DIAGNOSIS — N179 Acute kidney failure, unspecified: Secondary | ICD-10-CM | POA: Diagnosis present

## 2021-11-04 DIAGNOSIS — R1310 Dysphagia, unspecified: Secondary | ICD-10-CM | POA: Diagnosis present

## 2021-11-04 DIAGNOSIS — R600 Localized edema: Secondary | ICD-10-CM | POA: Diagnosis present

## 2021-11-04 DIAGNOSIS — R233 Spontaneous ecchymoses: Secondary | ICD-10-CM | POA: Diagnosis present

## 2021-11-04 DIAGNOSIS — Z881 Allergy status to other antibiotic agents status: Secondary | ICD-10-CM

## 2021-11-04 DIAGNOSIS — M7989 Other specified soft tissue disorders: Secondary | ICD-10-CM | POA: Diagnosis not present

## 2021-11-04 DIAGNOSIS — E785 Hyperlipidemia, unspecified: Secondary | ICD-10-CM | POA: Diagnosis present

## 2021-11-04 DIAGNOSIS — R4701 Aphasia: Secondary | ICD-10-CM | POA: Diagnosis present

## 2021-11-04 DIAGNOSIS — Z79899 Other long term (current) drug therapy: Secondary | ICD-10-CM

## 2021-11-04 DIAGNOSIS — F419 Anxiety disorder, unspecified: Secondary | ICD-10-CM | POA: Diagnosis present

## 2021-11-04 DIAGNOSIS — R011 Cardiac murmur, unspecified: Secondary | ICD-10-CM | POA: Diagnosis present

## 2021-11-04 DIAGNOSIS — Z87442 Personal history of urinary calculi: Secondary | ICD-10-CM

## 2021-11-04 DIAGNOSIS — R29718 NIHSS score 18: Secondary | ICD-10-CM | POA: Diagnosis not present

## 2021-11-04 DIAGNOSIS — L03116 Cellulitis of left lower limb: Secondary | ICD-10-CM | POA: Diagnosis present

## 2021-11-04 DIAGNOSIS — C50919 Malignant neoplasm of unspecified site of unspecified female breast: Secondary | ICD-10-CM | POA: Diagnosis present

## 2021-11-04 DIAGNOSIS — G4733 Obstructive sleep apnea (adult) (pediatric): Secondary | ICD-10-CM | POA: Diagnosis present

## 2021-11-04 DIAGNOSIS — M8580 Other specified disorders of bone density and structure, unspecified site: Secondary | ICD-10-CM | POA: Diagnosis present

## 2021-11-04 DIAGNOSIS — E876 Hypokalemia: Secondary | ICD-10-CM | POA: Diagnosis present

## 2021-11-04 DIAGNOSIS — I4821 Permanent atrial fibrillation: Secondary | ICD-10-CM | POA: Diagnosis present

## 2021-11-04 DIAGNOSIS — R569 Unspecified convulsions: Secondary | ICD-10-CM | POA: Diagnosis present

## 2021-11-04 DIAGNOSIS — R29723 NIHSS score 23: Secondary | ICD-10-CM | POA: Diagnosis not present

## 2021-11-04 DIAGNOSIS — Z66 Do not resuscitate: Secondary | ICD-10-CM | POA: Diagnosis not present

## 2021-11-04 DIAGNOSIS — G936 Cerebral edema: Secondary | ICD-10-CM | POA: Diagnosis present

## 2021-11-04 DIAGNOSIS — Z8249 Family history of ischemic heart disease and other diseases of the circulatory system: Secondary | ICD-10-CM

## 2021-11-04 DIAGNOSIS — R29722 NIHSS score 22: Secondary | ICD-10-CM | POA: Diagnosis not present

## 2021-11-04 DIAGNOSIS — G8194 Hemiplegia, unspecified affecting left nondominant side: Secondary | ICD-10-CM | POA: Diagnosis present

## 2021-11-04 DIAGNOSIS — Z1501 Genetic susceptibility to malignant neoplasm of breast: Secondary | ICD-10-CM

## 2021-11-04 DIAGNOSIS — E1142 Type 2 diabetes mellitus with diabetic polyneuropathy: Secondary | ICD-10-CM | POA: Diagnosis present

## 2021-11-04 DIAGNOSIS — R29721 NIHSS score 21: Secondary | ICD-10-CM | POA: Diagnosis not present

## 2021-11-04 DIAGNOSIS — Z9851 Tubal ligation status: Secondary | ICD-10-CM

## 2021-11-04 DIAGNOSIS — D638 Anemia in other chronic diseases classified elsewhere: Secondary | ICD-10-CM | POA: Diagnosis present

## 2021-11-04 DIAGNOSIS — Z7189 Other specified counseling: Secondary | ICD-10-CM | POA: Diagnosis not present

## 2021-11-04 DIAGNOSIS — I129 Hypertensive chronic kidney disease with stage 1 through stage 4 chronic kidney disease, or unspecified chronic kidney disease: Secondary | ICD-10-CM | POA: Diagnosis present

## 2021-11-04 DIAGNOSIS — Z8673 Personal history of transient ischemic attack (TIA), and cerebral infarction without residual deficits: Secondary | ICD-10-CM

## 2021-11-04 DIAGNOSIS — H510 Palsy (spasm) of conjugate gaze: Secondary | ICD-10-CM | POA: Diagnosis present

## 2021-11-04 DIAGNOSIS — N1831 Chronic kidney disease, stage 3a: Secondary | ICD-10-CM | POA: Diagnosis present

## 2021-11-04 DIAGNOSIS — Z91040 Latex allergy status: Secondary | ICD-10-CM

## 2021-11-04 DIAGNOSIS — Z8701 Personal history of pneumonia (recurrent): Secondary | ICD-10-CM

## 2021-11-04 DIAGNOSIS — Z9841 Cataract extraction status, right eye: Secondary | ICD-10-CM

## 2021-11-04 DIAGNOSIS — Z882 Allergy status to sulfonamides status: Secondary | ICD-10-CM

## 2021-11-04 DIAGNOSIS — R9082 White matter disease, unspecified: Secondary | ICD-10-CM | POA: Diagnosis present

## 2021-11-04 DIAGNOSIS — Z6841 Body Mass Index (BMI) 40.0 and over, adult: Secondary | ICD-10-CM

## 2021-11-04 DIAGNOSIS — Z803 Family history of malignant neoplasm of breast: Secondary | ICD-10-CM

## 2021-11-04 DIAGNOSIS — R29717 NIHSS score 17: Secondary | ICD-10-CM | POA: Diagnosis present

## 2021-11-04 DIAGNOSIS — M1990 Unspecified osteoarthritis, unspecified site: Secondary | ICD-10-CM | POA: Diagnosis present

## 2021-11-04 DIAGNOSIS — Z96653 Presence of artificial knee joint, bilateral: Secondary | ICD-10-CM | POA: Diagnosis present

## 2021-11-04 DIAGNOSIS — Z9109 Other allergy status, other than to drugs and biological substances: Secondary | ICD-10-CM

## 2021-11-04 DIAGNOSIS — Z888 Allergy status to other drugs, medicaments and biological substances status: Secondary | ICD-10-CM

## 2021-11-04 HISTORY — PX: IR US GUIDE VASC ACCESS RIGHT: IMG2390

## 2021-11-04 HISTORY — PX: IR CT HEAD LTD: IMG2386

## 2021-11-04 HISTORY — PX: RADIOLOGY WITH ANESTHESIA: SHX6223

## 2021-11-04 HISTORY — PX: IR PERCUTANEOUS ART THROMBECTOMY/INFUSION INTRACRANIAL INC DIAG ANGIO: IMG6087

## 2021-11-04 LAB — I-STAT CHEM 8, ED
BUN: 22 mg/dL (ref 8–23)
Calcium, Ion: 1.11 mmol/L — ABNORMAL LOW (ref 1.15–1.40)
Chloride: 99 mmol/L (ref 98–111)
Creatinine, Ser: 1.2 mg/dL — ABNORMAL HIGH (ref 0.44–1.00)
Glucose, Bld: 112 mg/dL — ABNORMAL HIGH (ref 70–99)
HCT: 34 % — ABNORMAL LOW (ref 36.0–46.0)
Hemoglobin: 11.6 g/dL — ABNORMAL LOW (ref 12.0–15.0)
Potassium: 3 mmol/L — ABNORMAL LOW (ref 3.5–5.1)
Sodium: 138 mmol/L (ref 135–145)
TCO2: 24 mmol/L (ref 22–32)

## 2021-11-04 LAB — POCT I-STAT 7, (LYTES, BLD GAS, ICA,H+H)
Acid-base deficit: 1 mmol/L (ref 0.0–2.0)
Bicarbonate: 24.5 mmol/L (ref 20.0–28.0)
Calcium, Ion: 1.15 mmol/L (ref 1.15–1.40)
HCT: 31 % — ABNORMAL LOW (ref 36.0–46.0)
Hemoglobin: 10.5 g/dL — ABNORMAL LOW (ref 12.0–15.0)
O2 Saturation: 100 %
Patient temperature: 98
Potassium: 3.2 mmol/L — ABNORMAL LOW (ref 3.5–5.1)
Sodium: 138 mmol/L (ref 135–145)
TCO2: 26 mmol/L (ref 22–32)
pCO2 arterial: 42.6 mmHg (ref 32–48)
pH, Arterial: 7.367 (ref 7.35–7.45)
pO2, Arterial: 219 mmHg — ABNORMAL HIGH (ref 83–108)

## 2021-11-04 LAB — CREATININE, SERUM
Creatinine, Ser: 1.15 mg/dL — ABNORMAL HIGH (ref 0.44–1.00)
GFR, Estimated: 49 mL/min — ABNORMAL LOW (ref >60)

## 2021-11-04 LAB — CBC
HCT: 34.9 % — ABNORMAL LOW (ref 36.0–46.0)
HCT: 34 % — ABNORMAL LOW (ref 36.0–46.0)
Hemoglobin: 11.1 g/dL — ABNORMAL LOW (ref 12.0–15.0)
Hemoglobin: 10.7 g/dL — ABNORMAL LOW (ref 12.0–15.0)
MCH: 29.5 pg (ref 26.0–34.0)
MCH: 29 pg (ref 26.0–34.0)
MCHC: 31.8 g/dL (ref 30.0–36.0)
MCHC: 31.5 g/dL (ref 30.0–36.0)
MCV: 92.8 fL (ref 80.0–100.0)
MCV: 92.1 fL (ref 80.0–100.0)
Platelets: 219 10*3/uL (ref 150–400)
Platelets: 209 10*3/uL (ref 150–400)
RBC: 3.76 MIL/uL — ABNORMAL LOW (ref 3.87–5.11)
RBC: 3.69 MIL/uL — ABNORMAL LOW (ref 3.87–5.11)
RDW: 16.1 % — ABNORMAL HIGH (ref 11.5–15.5)
RDW: 15.9 % — ABNORMAL HIGH (ref 11.5–15.5)
WBC: 8.7 10*3/uL (ref 4.0–10.5)
WBC: 10.2 10*3/uL (ref 4.0–10.5)
nRBC: 0 % (ref 0.0–0.2)
nRBC: 0 % (ref 0.0–0.2)

## 2021-11-04 LAB — COMPREHENSIVE METABOLIC PANEL
ALT: 19 U/L (ref 0–44)
AST: 25 U/L (ref 15–41)
Albumin: 2.8 g/dL — ABNORMAL LOW (ref 3.5–5.0)
Alkaline Phosphatase: 84 U/L (ref 38–126)
Anion gap: 8 (ref 5–15)
BUN: 26 mg/dL — ABNORMAL HIGH (ref 8–23)
CO2: 26 mmol/L (ref 22–32)
Calcium: 8.4 mg/dL — ABNORMAL LOW (ref 8.9–10.3)
Chloride: 104 mmol/L (ref 98–111)
Creatinine, Ser: 1.17 mg/dL — ABNORMAL HIGH (ref 0.44–1.00)
GFR, Estimated: 48 mL/min — ABNORMAL LOW (ref >60)
Glucose, Bld: 118 mg/dL — ABNORMAL HIGH (ref 70–99)
Potassium: 3.2 mmol/L — ABNORMAL LOW (ref 3.5–5.1)
Sodium: 138 mmol/L (ref 135–145)
Total Bilirubin: 1.2 mg/dL (ref 0.3–1.2)
Total Protein: 5.7 g/dL — ABNORMAL LOW (ref 6.5–8.1)

## 2021-11-04 LAB — DIFFERENTIAL
Abs Immature Granulocytes: 0.03 10*3/uL (ref 0.00–0.07)
Basophils Absolute: 0 10*3/uL (ref 0.0–0.1)
Basophils Relative: 0 %
Eosinophils Absolute: 0.5 10*3/uL (ref 0.0–0.5)
Eosinophils Relative: 6 %
Immature Granulocytes: 0 %
Lymphocytes Relative: 9 %
Lymphs Abs: 0.7 10*3/uL (ref 0.7–4.0)
Monocytes Absolute: 0.6 10*3/uL (ref 0.1–1.0)
Monocytes Relative: 6 %
Neutro Abs: 6.9 10*3/uL (ref 1.7–7.7)
Neutrophils Relative %: 79 %

## 2021-11-04 LAB — HEMOGLOBIN A1C
Hgb A1c MFr Bld: 5.4 % (ref 4.8–5.6)
Mean Plasma Glucose: 108.28 mg/dL

## 2021-11-04 LAB — GLUCOSE, CAPILLARY
Glucose-Capillary: 103 mg/dL — ABNORMAL HIGH (ref 70–99)
Glucose-Capillary: 77 mg/dL (ref 70–99)

## 2021-11-04 LAB — PROTIME-INR
INR: 1.3 — ABNORMAL HIGH (ref 0.8–1.2)
Prothrombin Time: 15.7 seconds — ABNORMAL HIGH (ref 11.4–15.2)

## 2021-11-04 LAB — RESP PANEL BY RT-PCR (FLU A&B, COVID) ARPGX2
Influenza A by PCR: NEGATIVE
Influenza B by PCR: NEGATIVE
SARS Coronavirus 2 by RT PCR: NEGATIVE

## 2021-11-04 LAB — APTT: aPTT: 36 seconds (ref 24–36)

## 2021-11-04 LAB — ETHANOL: Alcohol, Ethyl (B): <10 mg/dL (ref <10)

## 2021-11-04 SURGERY — IR WITH ANESTHESIA
Anesthesia: General

## 2021-11-04 MED ORDER — ACETAMINOPHEN 650 MG RE SUPP: 650.0000 mg | RECTAL | Status: DC | PRN

## 2021-11-04 MED ORDER — FENTANYL CITRATE PF 50 MCG/ML IJ SOSY: 25.0000 ug | PREFILLED_SYRINGE | Freq: Once | INTRAMUSCULAR | Status: DC

## 2021-11-04 MED ORDER — PHENYLEPHRINE HCL-NACL 20-0.9 MG/250ML-% IV SOLN
INTRAVENOUS | Status: DC | PRN
  Administered 2021-11-04: 25 ug/min via INTRAVENOUS

## 2021-11-04 MED ORDER — LIDOCAINE 2% (20 MG/ML) 5 ML SYRINGE
INTRAMUSCULAR | Status: DC | PRN
  Administered 2021-11-04: 100 mg via INTRAVENOUS

## 2021-11-04 MED ORDER — CHLORHEXIDINE GLUCONATE CLOTH 2 % EX PADS
6.0000 | MEDICATED_PAD | Freq: Every day | CUTANEOUS | Status: DC
  Administered 2021-11-06: 6 via TOPICAL

## 2021-11-04 MED ORDER — SENNOSIDES-DOCUSATE SODIUM 8.6-50 MG PO TABS: 1.0000 | ORAL_TABLET | Freq: Every evening | ORAL | Status: DC | PRN

## 2021-11-04 MED ORDER — ROCURONIUM BROMIDE 10 MG/ML (PF) SYRINGE
PREFILLED_SYRINGE | INTRAVENOUS | Status: DC | PRN
  Administered 2021-11-04: 40 mg via INTRAVENOUS
  Administered 2021-11-04: 20 mg via INTRAVENOUS
  Administered 2021-11-04: 10 mg via INTRAVENOUS

## 2021-11-04 MED ORDER — LEVETIRACETAM IN NACL 1000 MG/100ML IV SOLN
1000.0000 mg | Freq: Once | INTRAVENOUS | Status: AC
  Administered 2021-11-04: 1000 mg via INTRAVENOUS
  Filled 2021-11-04: qty 100

## 2021-11-04 MED ORDER — IOHEXOL 350 MG/ML SOLN
100.0000 mL | Freq: Once | INTRAVENOUS | Status: AC | PRN
  Administered 2021-11-04: 100 mL via INTRAVENOUS

## 2021-11-04 MED ORDER — CLEVIDIPINE BUTYRATE 0.5 MG/ML IV EMUL
0.0000 mg/h | INTRAVENOUS | Status: DC
  Administered 2021-11-04: 2 mg/h via INTRAVENOUS

## 2021-11-04 MED ORDER — PROPOFOL 1000 MG/100ML IV EMUL
5.0000 ug/kg/min | INTRAVENOUS | Status: DC
  Administered 2021-11-04: 50 ug/kg/min via INTRAVENOUS

## 2021-11-04 MED ORDER — PANTOPRAZOLE 2 MG/ML SUSPENSION
40.0000 mg | Freq: Every day | ORAL | Status: DC
  Filled 2021-11-04: qty 20

## 2021-11-04 MED ORDER — PANTOPRAZOLE SODIUM 40 MG IV SOLR
40.0000 mg | INTRAVENOUS | Status: DC
  Administered 2021-11-04: 40 mg via INTRAVENOUS
  Filled 2021-11-04: qty 10

## 2021-11-04 MED ORDER — CLEVIDIPINE BUTYRATE 0.5 MG/ML IV EMUL
0.0000 mg/h | INTRAVENOUS | Status: DC
  Filled 2021-11-04: qty 50

## 2021-11-04 MED ORDER — INSULIN ASPART 100 UNIT/ML IJ SOLN: 1.0000 [IU] | INTRAMUSCULAR | Status: DC

## 2021-11-04 MED ORDER — ACETAMINOPHEN 650 MG RE SUPP
650.0000 mg | Freq: Once | RECTAL | Status: AC
  Administered 2021-11-04: 650 mg via RECTAL
  Filled 2021-11-04: qty 1

## 2021-11-04 MED ORDER — POLYETHYLENE GLYCOL 3350 17 G PO PACK: 17.0000 g | PACK | Freq: Every day | ORAL | Status: DC

## 2021-11-04 MED ORDER — ORAL CARE MOUTH RINSE
15.0000 mL | OROMUCOSAL | Status: DC
Start: 2021-11-04 — End: 2021-11-06
  Administered 2021-11-04 – 2021-11-06 (×21): 15 mL via OROMUCOSAL

## 2021-11-04 MED ORDER — CLINDAMYCIN PHOSPHATE 900 MG/6ML IJ SOLN
600.0000 mg | Freq: Three times a day (TID) | INTRAMUSCULAR | Status: DC
Start: 2021-11-04 — End: 2021-11-05
  Administered 2021-11-04: 600 mg via INTRAMUSCULAR
  Filled 2021-11-04 (×3): qty 4

## 2021-11-04 MED ORDER — ACETAMINOPHEN 325 MG PO TABS: 650.0000 mg | ORAL_TABLET | ORAL | Status: DC | PRN

## 2021-11-04 MED ORDER — ENOXAPARIN SODIUM 40 MG/0.4ML IJ SOSY
40.0000 mg | PREFILLED_SYRINGE | INTRAMUSCULAR | Status: DC
  Administered 2021-11-04: 40 mg via SUBCUTANEOUS
  Filled 2021-11-04: qty 0.4

## 2021-11-04 MED ORDER — PROPOFOL 1000 MG/100ML IV EMUL
0.0000 ug/kg/min | INTRAVENOUS | Status: DC
  Filled 2021-11-04 (×3): qty 100

## 2021-11-04 MED ORDER — VERAPAMIL HCL 2.5 MG/ML IV SOLN
INTRAVENOUS | Status: AC | PRN
  Administered 2021-11-04: 5 mg via INTRA_ARTERIAL

## 2021-11-04 MED ORDER — SODIUM CHLORIDE 0.9 % IV SOLN: INTRAVENOUS | Status: DC

## 2021-11-04 MED ORDER — FENTANYL CITRATE (PF) 100 MCG/2ML IJ SOLN
INTRAMUSCULAR | Status: AC
  Filled 2021-11-04: qty 2

## 2021-11-04 MED ORDER — ORAL CARE MOUTH RINSE
15.0000 mL | OROMUCOSAL | Status: DC | PRN
Start: 2021-11-04 — End: 2021-11-06

## 2021-11-04 MED ORDER — ONDANSETRON HCL 4 MG/2ML IJ SOLN
INTRAMUSCULAR | Status: DC | PRN
  Administered 2021-11-04: 4 mg via INTRAVENOUS

## 2021-11-04 MED ORDER — IOHEXOL 300 MG/ML  SOLN
100.0000 mL | Freq: Once | INTRAMUSCULAR | Status: AC | PRN
  Administered 2021-11-04: 100 mL via INTRA_ARTERIAL

## 2021-11-04 MED ORDER — PROPOFOL 10 MG/ML IV BOLUS
INTRAVENOUS | Status: DC | PRN
  Administered 2021-11-04: 80 mg via INTRAVENOUS
  Administered 2021-11-04: 120 mg via INTRAVENOUS

## 2021-11-04 MED ORDER — DOCUSATE SODIUM 50 MG/5ML PO LIQD: 100.0000 mg | Freq: Two times a day (BID) | ORAL | Status: DC

## 2021-11-04 MED ORDER — LIDOCAINE HCL 1 % IJ SOLN
INTRAMUSCULAR | Status: AC
  Filled 2021-11-04: qty 20

## 2021-11-04 MED ORDER — PROPOFOL 1000 MG/100ML IV EMUL
5.0000 ug/kg/min | INTRAVENOUS | Status: DC
  Administered 2021-11-04: 15 ug/kg/min via INTRAVENOUS
  Administered 2021-11-04: 30 ug/kg/min via INTRAVENOUS
  Administered 2021-11-05: 20 ug/kg/min via INTRAVENOUS
  Filled 2021-11-04: qty 100

## 2021-11-04 MED ORDER — SUGAMMADEX SODIUM 200 MG/2ML IV SOLN
INTRAVENOUS | Status: DC | PRN
  Administered 2021-11-04: 200 mg via INTRAVENOUS

## 2021-11-04 MED ORDER — CLEVIDIPINE BUTYRATE 0.5 MG/ML IV EMUL
INTRAVENOUS | Status: AC
  Filled 2021-11-04: qty 50

## 2021-11-04 MED ORDER — ACETAMINOPHEN 160 MG/5ML PO SOLN: 650.0000 mg | ORAL | Status: DC | PRN

## 2021-11-04 MED ORDER — SODIUM CHLORIDE 0.9 % IV SOLN: INTRAVENOUS | Status: DC | PRN

## 2021-11-04 MED ORDER — STROKE: EARLY STAGES OF RECOVERY BOOK
Freq: Once | Status: DC
  Filled 2021-11-04: qty 1

## 2021-11-04 MED ORDER — SUCCINYLCHOLINE CHLORIDE 200 MG/10ML IV SOSY
PREFILLED_SYRINGE | INTRAVENOUS | Status: DC | PRN
  Administered 2021-11-04: 200 mg via INTRAVENOUS
  Administered 2021-11-04: 120 mg via INTRAVENOUS

## 2021-11-04 MED ORDER — VERAPAMIL HCL 2.5 MG/ML IV SOLN
INTRAVENOUS | Status: AC
  Filled 2021-11-04: qty 2

## 2021-11-04 MED ORDER — FENTANYL BOLUS VIA INFUSION
25.0000 ug | INTRAVENOUS | Status: DC | PRN
  Administered 2021-11-04: 100 ug via INTRAVENOUS
  Administered 2021-11-04: 50 ug via INTRAVENOUS

## 2021-11-04 MED ORDER — FENTANYL CITRATE (PF) 100 MCG/2ML IJ SOLN
INTRAMUSCULAR | Status: DC | PRN
  Administered 2021-11-04 (×2): 50 ug via INTRAVENOUS

## 2021-11-04 MED ORDER — PHENYLEPHRINE 80 MCG/ML (10ML) SYRINGE FOR IV PUSH (FOR BLOOD PRESSURE SUPPORT)
PREFILLED_SYRINGE | INTRAVENOUS | Status: DC | PRN
  Administered 2021-11-04: 160 ug via INTRAVENOUS

## 2021-11-04 MED ORDER — FENTANYL 2500MCG IN NS 250ML (10MCG/ML) PREMIX INFUSION
25.0000 ug/h | INTRAVENOUS | Status: DC
  Administered 2021-11-04: 50 ug/h via INTRAVENOUS
  Filled 2021-11-04: qty 250

## 2021-11-04 NOTE — Anesthesia Procedure Notes (Signed)
Procedure Name: Intubation Date/Time: 11/02/2021 9:33 AM  Performed by: Harden Mo, CRNAPre-anesthesia Checklist: Patient identified, Emergency Drugs available, Suction available and Patient being monitored Patient Re-evaluated:Patient Re-evaluated prior to induction Oxygen Delivery Method: Circle System Utilized Preoxygenation: Pre-oxygenation with 100% oxygen Induction Type: IV induction, Rapid sequence and Cricoid Pressure applied Laryngoscope Size: Miller and 2 Grade View: Grade I Tube type: Oral Tube size: 7.0 mm Number of attempts: 1 Airway Equipment and Method: Stylet and Oral airway Placement Confirmation: ETT inserted through vocal cords under direct vision, positive ETCO2 and breath sounds checked- equal and bilateral Secured at: 21 cm Tube secured with: Tape Dental Injury: Teeth and Oropharynx as per pre-operative assessment

## 2021-11-04 NOTE — Progress Notes (Signed)
Pt seen bedside by Dr. Margarita Sermons.  Spouse, [name], and Son, Erlene Quan, were in room along with patient's nurse.   Pt is sedated and intubated.  She spontaneously moves right side and left toes.  No apparent movement of the left upper extremity.  Groin puncture site is soft and dry.   Will await MRI and assess again in the morning.  Electronically Signed: Pasty Spillers, PA-C 11/10/2021, 3:47 PM

## 2021-11-04 NOTE — Transfer of Care (Addendum)
Immediate Anesthesia Transfer of Care Note  Patient: Song Myre Sinn  Procedure(s) Performed: IR WITH ANESTHESIA  Patient Location: PACU  Anesthesia Type:General  Level of Consciousness: awake and drowsy  Airway & Oxygen Therapy: Patient Spontanous Breathing and Patient connected to face mask oxygen  Post-op Assessment: Report given to RN and Post -op Vital signs reviewed and unstable, Anesthesiologist notified  Post vital signs: Reviewed and unstable  Last Vitals:  Vitals Value Taken Time  BP 149/125 11/14/2021 1142  Temp    Pulse 99 10/24/2021 1144  Resp 29 11/18/2021 1144  SpO2 80 % 10/25/2021 1144  Vitals shown include unvalidated device data.  Last Pain:  Vitals:   11/20/2021 0832  TempSrc: Rectal  PainSc:          Complications: No notable events documented. MDA and CRNA at the bedside monitoring patient's respiratory effort. RT met the patient in PACU to place the patient on CPAP. Will wait 5-10 minutes to see if patient's status improves on CPAP.

## 2021-11-04 NOTE — Code Documentation (Signed)
Stroke Response Nurse Documentation Code Documentation  Nicole Cabrera is a 76 y.o. female arriving to [facility]  via [facility] from [place] on 10/22/2021 with past medical hx of sleep apnea, hypertension, obesity, anxiety, peripheral neuropathy, TIA, heart murmur, stroke wit some short term memory loss reported, atrial fibrillation, pre diabetes, and breast cancer with mets to spine and sternum. On Eliquis (apixaban) daily, however stopped recently related to bleeding. Code stroke was activated by ED based on tele neurology.   Patient from home where she was LKW at 2030 and now complaining of left sided weakness, right gaze, and decreased sensation. Husband reports she went to bed at 2030. He heard her banging on the bed at 0330 this morning and found her wedged between the bed and the nightstand. Called EMS as he noted slurred speech and left side weakness.   Stroke team at the bedside on patient arrival. NIHSS 18, see documentation for details and code stroke times. Patient with right gaze preference , left hemianopia, left facial droop, left arm weakness, bilateral leg weakness, left decreased sensation, and Sensory  neglect on exam. The following imaging was completed:  [facility]A and [facility]P at [facility]. 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 Patient is not a candidate for IV Thrombolytic due to out of window. Patient is a candidate for IR due to positive LVO.   Care Plan: admit to ICU post IR.   Bedside handoff with IR RN.  Leverne Humbles Stroke Response RN

## 2021-11-04 NOTE — H&P (Signed)
NEUROLOGY CONSULTATION NOTE   Date of service: November 04, 2021 Patient Name: Nicole Cabrera MRN:  [id number] DOB:  1946-03-02  _ _ _   _ __   _ __ _ _  __ __   _ __   __ _  History of Present Illness  Nicole Cabrera is a 76 y.o. female with PMH significant for atrial fibrillation not on anticoagulation due to history of vaginal bleeding on Eliquis, obstructive sleep apnea, prior strokes and TIAs, prediabetes, history of breast cancer and on immunotherapy, nephrolithiasis, hypertension who initially presented to [facility] with acute onset left-sided weakness, right gaze deviation, neglect.  Per husband over phone, she went to bed at 8:30 PM on 11/03/2021 and he heard her pounding on her bed at 3:30 AM in the morning on 11/15/2021.  He open the door and went to her room and found that she was wedged between the bed and the nightstand.  Husband noted that she was having trouble controlling her left side and her speech was slurred and incoherent.  They called EMS and she was transported to [facility]: 2030 on 11/03/2021 mRS: 2.  She is able to read, watches TV, able to walk with a walker and mobile at baseline.  She does require assistance with changing but is able to shower by herself. tNKASE: Not offered at any point hospital as patient was outside the window at the time of arrival. Thrombectomy: Positive for a emergent LVO in the right MCA posterior/inferior M2 branch with decreased vessel density in the posterior MCA distribution noted on the CTA along with a perfusion mismatch in that area. Dr. Margarita Sermons discussed presentation, imaging finding along with details of thrombectomy and risks and benefits with patient's husband Mr. Nicole Cabrera.  Mr. Brame understood the details of the procedure and agreed and consented to the procedure. He had no further questions. NIHSS components Score: Comment  1a Level of Conscious 0'[]'  1'[x]'  2'[]'  3'[]'      1b LOC Questions 0'[x]'  1'[]'  2'[]'        1c LOC Commands 0'[x]'  1'[]'  2'[]'       2 Best Gaze 0'[]'  1'[]'  2'[x]'       3 Visual 0'[]'  1'[]'  2'[x]'  3'[]'      4 Facial Palsy 0'[]'  1'[]'  2'[x]'  3'[]'      5a Motor Arm - left 0'[]'  1'[]'  2'[]'  3'[]'  4'[x]'  UN'[]'    5b Motor Arm - Right 0'[x]'  1'[]'  2'[]'  3'[]'  4'[]'  UN'[]'    6a Motor Leg - Left 0'[]'  1'[]'  2'[]'  3'[x]'  4'[]'  UN'[]'    6b Motor Leg - Right 0'[]'  1'[x]'  2'[]'  3'[]'  4'[]'  UN'[]'    7 Limb Ataxia 0'[x]'  1'[]'  2'[]'  3'[]'  UN'[]'     8 Sensory 0'[]'  1'[]'  2'[x]'  UN'[]'      9 Best Language 0'[x]'  1'[]'  2'[]'  3'[]'      10 Dysarthria 0'[]'  1'[]'  2'[x]'  UN'[]'      11 Extinct. and Inattention 0'[]'  1'[]'  2'[x]'       TOTAL: 21     ROS   Unable to obtain ROS due to encephalopathy, somnolence and the acuity of the situation.  Past History   Past Medical History:  Diagnosis Date   Allergy    Anxiety    pt denies, takes Xanax for her peripheral neuropathy   Arthritis    shoulders/hips - tx with OTC meds   Breast cyst 03/1990   pt denies 12/05/13   Bronchitis    Cancer ([place])    Breast   Cataract    Cervical tab  1972   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   Dyspnea    Dysrhythmia    Endometrial polyp 04/2000   Fall 01/2018   nose fracture    Heart murmur    Hemorrhoids    History of kidney stones    Hypertension    Impaired glucose tolerance    Obesity    Osteopenia 05/2007   Paroxysmal atrial fibrillation (HCC)    Peripheral neuropathy    PMB (postmenopausal bleeding) 04/2000   Pneumonia    Pre-diabetes    SAB (spontaneous abortion) 1981   Sleep apnea    CPAP   Stroke ([place])    no lasting weakness or paralysis, does have some short term memory loss   SVD (spontaneous vaginal delivery) 04/1964, 05/1978   x3   TIA (transient ischemic attack)    Past Surgical History:  Procedure Laterality Date   BREAST BIOPSY Right 06/2020   x2   CATARACT EXTRACTION Right 12/07/2018   CERVICAL [place] SURGERY  04/1991   CESAREAN SECTION  08/1981   with BTL   COLONOSCOPY N/A 03/19/2017   Procedure: COLONOSCOPY;  Surgeon: Doran Stabler, MD;  Location: [facility];   Service: Gastroenterology;  Laterality: N/A;   DILATION AND CURETTAGE OF UTERUS     HYSTEROSCOPY  5/02, 01/2011   JOINT REPLACEMENT     LITHOTRIPSY  08/30/2014   with Dr. Jeffie Pollock at [facility] Right 07/28/2021   Procedure: INSERTION PORT-A-CATH;  Surgeon: [name], MD;  Location: [facility];  Service: General;  Laterality: Right;   TOTAL KNEE ARTHROPLASTY Right 08/2006   right   TOTAL KNEE ARTHROPLASTY Left 06/2008   left    TUBAL LIGATION     WRIST SURGERY Left 06/2005   left   Family History  Problem Relation Age of Onset   Colon cancer Mother 74 - 40   Heart disease Mother    Heart disease Father    Breast cancer Maternal Aunt 30 - 39   Colon cancer Maternal Aunt 80 - 89   Colon cancer Maternal Uncle 79   Cancer Paternal Grandfather        unknown type   Ehlers-Danlos syndrome Other        neice-being tested 4/15   Breast cancer Other    Stomach cancer Neg Hx    Pancreatic cancer Neg Hx    Endometrial cancer Neg Hx    Ovarian cancer Neg Hx    Social History   Socioeconomic History   Marital status: Married    Spouse name: [name]   Number of children: 3   Years of education: college   Highest education level: Not on file  Occupational History   Occupation: Scientist, 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   Patient is a homemaker.   Education- College   Right handed.   Caffeine- one coke cola          Social Determinants of Radio broadcast assistant  Strain: Not on file  Food Insecurity: Not on file  Transportation Needs:  Not on file  Physical Activity: Not on file  Stress: Not on file  Social Connections: Not on file   Allergies  Allergen Reactions   Bactrim [Sulfamethoxazole-Trimethoprim] Other (See Comments)    Unknown    Ciprofloxacin Other (See Comments)    GI upset   Hydroxyzine Hcl     Other reaction(s): Other (See Comments) Pt becomes physchotic.   Keflet [Cephalexin] Other (See Comments)    GI upset   Lipitor [Atorvastatin]     Muscle cramps   Lisinopril Cough   Spironolactone Nausea Only   Vistaril [Hydroxyzine Hcl] Other (See Comments)    Pt becomes physchotic.   Erythromycin Rash   Latex Rash   Penicillins Rash    Has patient had a PCN reaction causing immediate rash, facial/tongue/throat swelling, SOB or lightheadedness with hypotension: Yes Has patient had a PCN reaction causing severe rash involving mucus membranes or skin necrosis: No Has patient had a PCN reaction that required hospitalization No Has patient had a PCN reaction occurring within the last 10 years: No If all of the above answers are "NO", then may proceed with Cephalosporin use.    Tape Itching, Rash and Other (See Comments)    Burning   Tetracycline Rash    Medications  (Not in a hospital admission)    Vitals   Vitals:   11/11/2021 0700 11/05/2021 0730 11/09/2021 0800 11/12/2021 0832  BP: 109/72 134/82 132/69 120/74  Pulse: 99 82 96 96  Resp: (!) 28 (!) 27 (!) 31 (!) 32  Temp:    98.8 F (37.1 C)  TempSrc:    Rectal  SpO2: 91% 93% 98% 96%  Weight:      Height:         Body mass index is 48.09 kg/m.  Physical Exam   General: Obese,elderly female, laying comfortably in bed; in no acute distress.  HENT: Normal oropharynx and mucosa. Normal external appearance of ears and nose.  Neck: Supple, no pain or tenderness  CV: No JVD. No peripheral edema.  Pulmonary: Symmetric Chest rise. Normal respiratory effort.  Abdomen: Soft to touch, non-tender.  Ext: No cyanosis, notable BL pedal edema but no  deformity  Skin: No rash. Normal palpation of skin.   Musculoskeletal: Normal digits and nails by inspection. No clubbing.   Neurologic Examination  Mental status/Cognition: somnolent, oriented to self, place, month and year, poor attention.  Speech/language: Dysarthric speech, nonfluent, comprehension intact, object naming intact, repetition intact. Cranial nerves:   CN II Pupils equal and reactive to light, left hemianopsia   CN III,IV,VI Right gaze deviation and does not cross midline.   CN V Decrease in left face to touch.   CN VII Left facial droop   CN VIII Turns head towards voice   CN IX & X normal palatal elevation, no uvular deviation   CN XI Head turn to the right and would not cross over.   CN XII midline tongue protrusion   Motor:  Muscle bulk: Normal, tone flaccid in left upper extremity. Mvmt Root Nerve  Muscle Right Left Comments  SA C5/6 Ax Deltoid 5 0   EF C5/6 Mc Biceps 5 0   EE C6/7/8 Rad Triceps 5 0   WF C6/7 Med FCR     WE C7/8 PIN ECU     F Ab C8/T1 U ADM/FDI 5 0   HF L1/2/3 Fem Illopsoas 4 2   KE  L2/3/4 Fem Quad 4 2   DF L4/5 D [place]on Tib Ant 4 2   PF S1/2 Tibial Grc/Sol 4 2    Sensation:  Light touch Decreased to touch in left face, left arm and left leg.   Pin prick    Temperature    Vibration   Proprioception    Coordination/Complex Motor:  - Finger to Nose intact in right upper extremity - Heel to shin unable to do - Gait: Deferred for patient's safety. Labs   CBC:  Recent Labs  Lab 10/23/2021 0538 10/28/2021 0559  WBC 8.7  --   NEUTROABS 6.9  --   HGB 11.1* 11.6*  HCT 34.9* 34.0*  MCV 92.8  --   PLT 219  --     Basic Metabolic Panel:  Lab Results  Component Value Date   NA 138 11/18/2021   K 3.0 (L) 11/12/2021   CO2 26 10/21/2021   GLUCOSE 112 (H) 11/20/2021   BUN 22 11/18/2021   CREATININE 1.20 (H) 10/26/2021   CALCIUM 8.4 (L) 11/12/2021   GFRNONAA 48 (L) 10/22/2021   GFRAA >60 08/29/2019   Lipid Panel:  Lab Results   Component Value Date   LDLCALC 100 (H) 10/19/2019   HgbA1c:  Lab Results  Component Value Date   HGBA1C 6.2 (H) 10/19/2019   Urine Drug Screen:     Component Value Date/Time   LABOPIA NONE DETECTED 01/17/2017 1723   COCAINSCRNUR NONE DETECTED 01/17/2017 1723   LABBENZ NONE DETECTED 01/17/2017 1723   AMPHETMU NONE DETECTED 01/17/2017 1723   THCU NONE DETECTED 01/17/2017 1723   LABBARB NONE DETECTED 01/17/2017 1723    Alcohol Level     Component Value Date/Time   ETH <10 11/14/2021 0538    CT Head without contrast(Personally reviewed): Developing infarct in the right posterior PCA territory with an aspects of 8.  CT angio Head and Neck with contrast(Personally reviewed): Right MCA posterior/inferior M2 branch occlusion of the trifurcation.  CT perfusion(personally reviewed): Core infarct of 3 mL, penumbra of 86 mL  Impression   Mirha W Hesch is a 76 y.o. female with PMH significant for atrial fibrillation not on anticoagulation due to history of vaginal bleeding on Eliquis, obstructive sleep apnea, prior strokes and TIAs, prediabetes, history of breast cancer and on immunotherapy, nephrolithiasis, hypertension who initially presented to [facility] with acute onset left-sided weakness, right gaze deviation, neglect.  She was found to have a developing acute posterior right MCA infarct with right MCA M2 posterior/inferior branch occlusion at the trifurcation.  She was outside the window for [facility].  She was seen by teleneurology and case discussed with neuro IR and taken to thrombectomy.  Primary Diagnosis:  Cerebral infarction due to embolism of  right middle cerebral artery.   Secondary Diagnosis: Essential (primary) hypertension, Paroxysmal atrial fibrillation, Type 2 diabetes mellitus w/o complications, and Morbid Obesity(BMI > 40)  Recommendations   Acute right MCA stroke being taken for thrombectomy: - Frequent Neuro checks per stroke unit protocol -  Recommend brain imaging with MRI Brain without contrast - Recommend obtaining TTE - Recommend obtaining Lipid panel with LDL - Please start statin if LDL > 70 - Recommend HbA1c - Antithrombotic -Per neuro IR for the first 24 hours after intervention. - Recommend DVT ppx - SBP goal - per neuro IR for the first 24 hours after intervention. - Recommend Telemetry monitoring for arrythmia - Recommend bedside swallow screen prior to PO intake. - Stroke education booklet - Recommend PT/OT/SLP consult  Hypertension: Hold home medication.  As needed Cleviprex/labetalol for SBP above goal.  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  Atrial fibrillation, not on anticoagulation due to recurrent vaginal bleeding  Hyperlipidemia: Not on statin anymore due to muscle cramps/body aches. Consider PCSK9 inhibitors outpatient pending LDL.  OSA: Continue home CPAP machine.  This patient is critically ill and at significant risk of neurological worsening, death and care requires constant monitoring of vital signs, hemodynamics,respiratory and cardiac monitoring, neurological assessment, discussion with family, other specialists and medical decision making of high complexity. I spent 60 minutes of neurocritical care time  in the care of  this patient. This was time spent independent of any time provided by nurse practitioner or PA.  [place] Pager Number [phone] 10/23/2021  10:12 AM   ______________________________________________________________________   Thank you for the opportunity to take part in the care of this patient. If you have any further questions, please contact the neurology consultation attending.  Signed,  [place] Pager Number [phone] _ _ _   _ __   _ __ _ _  __ __   _ __   __ _

## 2021-11-04 NOTE — Anesthesia Procedure Notes (Signed)
Procedure Name: Intubation Date/Time: 11/17/2021 11:52 AM  Performed by: Harden Mo, CRNAPre-anesthesia Checklist: Patient identified, Emergency Drugs available, Suction available and Patient being monitored Patient Re-evaluated:Patient Re-evaluated prior to induction Oxygen Delivery Method: Ambu bag Preoxygenation: Pre-oxygenation with 100% oxygen Induction Type: IV induction and Rapid sequence Laryngoscope Size: 4 Grade View: Grade II Tube type: Oral Tube size: 7.0 mm Number of attempts: 1 Airway Equipment and Method: Stylet, Oral airway and Video-laryngoscopy Placement Confirmation: ETT inserted through vocal cords under direct vision, positive ETCO2 and breath sounds checked- equal and bilateral Secured at: 21 cm Tube secured with: Tape Dental Injury: Teeth and Oropharynx as per pre-operative assessment

## 2021-11-04 NOTE — ED Provider Notes (Signed)
[facility] Provider Note   CSN: [id number] Arrival date & time: 10/31/2021  0518     History  Chief Complaint  Patient presents with   Weakness    Nicole Cabrera is a 76 y.o. female.  HPI     This is a 76 year old female with history of stroke, breast cancer currently undergoing chemotherapy who presents with left-sided weakness.  Patient's husband provides most of the history.  She was last seen normal around 8:30 PM.  She normally gets up and walks around the house.  Has been woke up this morning to her banging on the wall.  He found her on the floor between 2 pieces of furniture.  She was having difficulty speaking and notably could not move her left side.  He states that normally she is on Eliquis; however has recently been taken off given issues due to bleeding.  She is due to have a Watchman device.  Patient is somnolent but alert and can answer orientation question.  She is oriented.  Level 5 caveat for acuity of condition  Home Medications Prior to Admission medications   Medication Sig Start Date End Date Taking? Authorizing Provider  acetaminophen (TYLENOL) 500 MG tablet Take 1,000 mg by mouth every 6 (six) hours as needed for moderate pain or headache.    Yes [provider]  albuterol (PROAIR HFA) 108 (90 Base) MCG/ACT inhaler INHALE TWO PUFFS BY MOUTH EVERY 6 HOURS AS NEEDED FOR WHEEZING AND SHORTNESS OF BREATH 11/23/19  Yes Young, Clinton D, MD  ALPRAZolam Duanne Moron) 0.25 MG tablet Take 0.25 mg by mouth daily as needed (peripheral neuropathy). 07/25/19  Yes [provider]  chlorthalidone (HYGROTON) 25 MG tablet TAKE ONE TABLET BY MOUTH DAILY. 11/12/20  Yes Verta Ellen., NP  cholecalciferol (VITAMIN D) 1000 UNITS tablet Take 1,000 Units by mouth every morning.    Yes [provider]  guaiFENesin (MUCINEX) 600 MG 12 hr tablet Take 600 mg by mouth daily as needed for cough.   Yes [provider]  hydrocortisone  cream 1 % Apply 1 application  topically daily. Mixed with clotrimazole cream. Washing in nizoral shampoo Applying 50/50 mix to lower abdomen   Yes [provider]  losartan (COZAAR) 100 MG tablet TAKE ONE TABLET BY MOUTH DAILY. 11/12/20  Yes Verta Ellen., NP  metoprolol tartrate (LOPRESSOR) 25 MG tablet TAKE ONE TABLET BY MOUTH TWICE DAILY, MAY TAKE ONE EXTRA TABLET AS NEEDED FOR PALPITATIONS 06/12/21  Yes Branch, Alphonse Guild, MD  Multiple Vitamins-Minerals (MULTIVITAMIN WITH MINERALS) tablet Take 1 tablet by mouth daily. Woman's 50 +   Yes [provider]  nitrofurantoin, macrocrystal-monohydrate, (MACROBID) 100 MG capsule Take 100 mg by mouth 2 (two) times daily. 10/31/21  Yes [provider]  nystatin (MYCOSTATIN/NYSTOP) powder Apply 1 application  topically 3 (three) times daily. 08/29/21  Yes Dede Query T, PA-C  ondansetron (ZOFRAN) 8 MG tablet Take 8 mg by mouth every 8 (eight) hours as needed for nausea or vomiting.   Yes [provider]  Polyethyl Glycol-Propyl Glycol (SYSTANE OP) Place 1 drop into both eyes daily as needed (dry eyes).    Yes [provider]  potassium chloride 20 MEQ/15ML (10%) SOLN Take 15 mLs (20 mEq total) by mouth daily. 10/16/21 05/14/22 Yes Branch, Alphonse Guild, MD  potassium chloride SA (KLOR-CON) 20 MEQ tablet TAKE ONE TABLET BY MOUTH DAILY. 11/12/20  Yes Verta Ellen., NP  sodium chloride (OCEAN) 0.65 %  SOLN nasal spray Place 1 spray into both nostrils as needed for congestion.   Yes [provider]  prochlorperazine (COMPAZINE) 10 MG tablet Take 1 tablet (10 mg total) by mouth every 6 (six) hours as needed (Nausea or vomiting). 07/28/21 10/27/21  [name], MD      Allergies    Bactrim [sulfamethoxazole-trimethoprim], Ciprofloxacin, Hydroxyzine hcl, Keflet [cephalexin], Lipitor [atorvastatin], Lisinopril, Spironolactone, Vistaril [hydroxyzine hcl], Erythromycin, Latex, Penicillins, Tape, and Tetracycline     Review of Systems   Review of Systems  Neurological:  Positive for speech difficulty and weakness.  All other systems reviewed and are negative.   Physical Exam Updated Vital Signs BP 109/72   Pulse 99   Temp 97.6 F (36.4 C) (Axillary)   Resp (!) 28   Ht 1.499 m ('4\' 11"'$ )   Wt 108 kg   LMP 01/22/2011   SpO2 91%   BMI 48.09 kg/m  Physical Exam Vitals and nursing note reviewed.  Constitutional:      Appearance: She is well-developed. She is obese.  HENT:     Head: Normocephalic and atraumatic.     Comments: Alopecia    Mouth/Throat:     Mouth: Mucous membranes are moist.  Eyes:     Pupils: Pupils are equal, round, and reactive to light.     Comments: Appears to have a gaze preference to the right; however, can move past midline to the left with instruction  Cardiovascular:     Rate and Rhythm: Normal rate. Rhythm irregular.     Heart sounds: Normal heart sounds.  Pulmonary:     Effort: Pulmonary effort is normal. No respiratory distress.     Breath sounds: No wheezing.  Abdominal:     Palpations: Abdomen is soft.     Tenderness: There is no abdominal tenderness.  Musculoskeletal:     Cervical back: Neck supple.  Skin:    General: Skin is warm and dry.  Neurological:     Mental Status: She is alert and oriented to person, place, and time.     Comments: Left-sided facial droop noted, patient oriented x3, dysarthria noted, 0 out of 5 strength left upper extremity, minimal strength left lower extremity, patient can slightly wiggle her toes  Psychiatric:     Comments: Unable to assess     ED Results / Procedures / Treatments   Labs (all labs ordered are listed, but only abnormal results are displayed) Labs Reviewed  PROTIME-INR - Abnormal; Notable for the following components:      Result Value   Prothrombin Time 15.7 (*)    INR 1.3 (*)    All other components within normal limits  CBC - Abnormal; Notable for the following components:   RBC 3.76 (*)     Hemoglobin 11.1 (*)    HCT 34.9 (*)    RDW 16.1 (*)    All other components within normal limits  I-STAT CHEM 8, ED - Abnormal; Notable for the following components:   Potassium 3.0 (*)    Creatinine, Ser 1.20 (*)    Glucose, Bld 112 (*)    Calcium, Ion 1.11 (*)    Hemoglobin 11.6 (*)    HCT 34.0 (*)    All other components within normal limits  RESP PANEL BY RT-PCR (FLU A&B, COVID) ARPGX2  ETHANOL  APTT  DIFFERENTIAL  COMPREHENSIVE METABOLIC PANEL  RAPID URINE DRUG SCREEN, HOSP PERFORMED  URINALYSIS, ROUTINE W REFLEX MICROSCOPIC    EKG EKG Interpretation  Date/Time:  Tuesday November 04 2021 05:29:00 EDT Ventricular Rate:  83 PR Interval:    QRS Duration: 93 QT Interval:  360 QTC Calculation: 423 R Axis:   124 Text Interpretation: Atrial fibrillation Right axis deviation Low voltage, extremity and precordial leads Confirmed by Thayer Jew [phone]) on 10/25/2021 5:31:02 AM  Radiology No results found.  Procedures .Critical Care  Performed by: Merryl Hacker, MD Authorized by: Merryl Hacker, MD   Critical care provider statement:    Critical care time (minutes):  75   Critical care was necessary to treat or prevent imminent or life-threatening deterioration of the following conditions:  CNS failure or compromise   Critical care was time spent personally by me on the following activities:  Development of treatment plan with patient or surrogate, discussions with consultants, evaluation of patient's response to treatment, examination of patient, ordering and review of laboratory studies, ordering and review of radiographic studies, ordering and performing treatments and interventions, pulse oximetry, re-evaluation of patient's condition and review of old charts     Medications Ordered in ED Medications  levETIRAcetam (KEPPRA) IVPB 1000 mg/100 mL premix (has no administration in time range)  iohexol (OMNIPAQUE) 350 MG/ML injection 100 mL (100 mLs Intravenous  Contrast Given 10/24/2021 0629)    ED Course/ Medical Decision Making/ A&P Clinical Course as of 11/17/2021 0710  Tue Nov 04, 2021  0540 Patient presents with stroke like symptoms.  Out of window for TNK.  She is technically VAN- negative although I have high suspicion for LVO given deficits on exam.  Emergently consulted teleneurology. [CH]  [phone] Spoke with teleneurology.  Recommends loading with Keppra as has been reported to him that she has been having seizures at home.  Unclear whether husband would be amenable to invasive procedure such as angiography.  Will obtain CTA/perfusion. [CH]  [phone] Husband is unsure whether he would like to pursue any invasive procedure.  However he does want to know what the scans show and speak to her neurologist regarding options.  Additionally, he would like her to be at [facility] given her ongoing cancer treatment. [CH]  2836 Poke with Dr. Nevada Crane, radiology.  Positive LVO MCA M2 branch.  Feels like this is likely amenable to intervention.  Awaiting callback from teleneurology. [CH]  0710 Patient signed out to Dr. Eulis Foster. [CH]    Clinical Course User Index [CH] Maxen Rowland, Barbette Hair, MD                           Medical Decision Making Amount and/or Complexity of Data Reviewed Labs: ordered. Radiology: ordered.  Risk Prescription drug management.   This patient presents to the ED for concern of strokelike symptoms, this involves an extensive number of treatment options, and is a complaint that carries with it a high risk of complications and morbidity.  I considered the following differential and admission for this acute, potentially life threatening condition.  The differential diagnosis includes acute stroke, Todd's paralysis, seizure, metastatic disease  MDM:    This is a 76 year old female with history of breast cancer and prior stroke who presents with left-sided deficits.  She is ill-appearing on exam.  ABCs are intact.  She is oriented but has significant  dysarthria and left-sided deficits.  Technically was Lucianne Lei negative although I do suspect LVO.  Had her assessed emergently by teleneurology.  She is out of the window for TNK.  CTA/P ordered and stroke work-up initiated.  See clinical course above.  Patient was loaded with IV Keppra.  (Labs, imaging, consults)  Labs: I Ordered, and personally interpreted labs.  The pertinent results include: Slightly low potassium at 3.0.  Imaging Studies ordered: I ordered imaging studies including CT angio, perfusion studies I independently visualized and interpreted imaging. I agree with the radiologist interpretation  Additional history obtained from husband at bedside.  External records from outside source obtained and reviewed including records regarding cancer care  Cardiac Monitoring: The patient was maintained on a cardiac monitor.  I personally viewed and interpreted the cardiac monitored which showed an underlying rhythm of: Atrial fibrillation  Reevaluation: After the interventions noted above, I reevaluated the patient and found that they have :stayed the same  Social Determinants of Health: Ongoing cancer treatment, lives independently  Disposition: Admission, patient signed out to Dr. Eulis Foster to follow-up with teleneurology recommendations and possible intervention.  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   Dyspnea    Dysrhythmia    Endometrial polyp 04/2000   Fall 01/2018   nose fracture    Heart murmur    Hemorrhoids    History of kidney stones    Hypertension    Impaired glucose tolerance    Obesity    Osteopenia 05/2007   Paroxysmal  atrial fibrillation ([facility])    Peripheral neuropathy    PMB (postmenopausal bleeding) 04/2000   Pneumonia    Pre-diabetes    SAB (spontaneous abortion) 1981   Sleep apnea    CPAP   Stroke ([place])    no lasting weakness or paralysis, does have some short term memory loss   SVD (spontaneous vaginal delivery) 04/1964, 05/1978   x3   TIA (transient ischemic attack)      Medicines Meds ordered this encounter  Medications   iohexol (OMNIPAQUE) 350 MG/ML injection 100 mL   levETIRAcetam (KEPPRA) IVPB 1000 mg/100 mL premix    I have reviewed the patients home medicines and have made adjustments as needed  Problem List / ED Course: Problem List Items Addressed This Visit   None               Final Clinical Impression(s) / ED Diagnoses Final diagnoses:  None    Rx / DC Orders ED Discharge Orders     None         Merryl Hacker, MD 10/27/2021 [phone]

## 2021-11-04 NOTE — Consult Note (Signed)
NAME:  Nicole Cabrera, MRN:  [id number], DOB:  03-09-1946, LOS: 0 ADMISSION DATE:  11/16/2021, CONSULTATION DATE:  8/15 REFERRING MD:  Lorrin Goodell, CHIEF COMPLAINT:  hypoxia following extubation following procedure   History of Present Illness:  Nicole Cabrera is a 76 year old with PMH of metastatic breast cancer who recently decided to discontinued therapy, atrial fibrillation not on anticoagulation 2/2 to vaginal bleeding, history of prior CVA, and OSA presented to [facility] 8/15 with acute onset left-sdied weakness, right gauze deviation and neglect. Her last known well was at 8:30PM 8/14 and he found her at 3:30AM wedged between her bed and nightstand with speech changes and trouble controlling left side. He called EMS at that time. CT head and neck shoed right MCA occlusion. Outside window for thrombolytics. She was transferred to [facility] for thrombectomy.   Following procedure she was extubated to CPAP in PACU then developed increased respiratory effort and required reintubation. PCCM consulted for acute hypoxic respiratory failure.  Pertinent  Medical History  Atrial fibrillation OSA CVA Predm Hx breast cancer on immunotherapy Nephrolithiasis HTN  Significant Hospital Events: Including procedures, antibiotic start and stop dates in addition to other pertinent events   8/15 presented to [facility] with MCA stroke, transferred to [facility] for thrombectomy. Respiratory distress following extubation post-procedure leading to reintubation  Interim History / Subjective:  Sedated on propofol/ fentanyl, opens eyes to touch and voice  Objective   Blood pressure (!) 102/48, pulse 80, temperature 99 F (37.2 C), temperature source Oral, resp. rate 20, height '4\' 11"'  (1.499 m), weight 108 kg, last menstrual period 01/22/2011, SpO2 97 %.    Vent Mode: PRVC FiO2 (%):  [50 %-100 %] 50 % Set Rate:  [22 bmp] 22 bmp Vt Set:  [340 mL] 340 mL PEEP:  [5 cmH20] 5 cmH20 Plateau Pressure:  [14  cmH20] 14 cmH20   Intake/Output Summary (Last 24 hours) at 10/29/2021 1559 Last data filed at 10/25/2021 1400 Gross per 24 hour  Intake 386.14 ml  Output 800 ml  Net -413.86 ml   Filed Weights   10/22/2021 0521  Weight: 108 kg    Examination: General: chronically ill-appearing elderly female, intubated HENT: no edema noted Lungs: ventilated lungs sounds bilaterally Cardiovascular: irregularly irregular rhythm, no m/r/g MSK: dimpled skin on left breast Abdomen: non-distended, bowels sounds present, soft Extremities: 2+ non-pitting edema in lower extremities bilaterally Neuro: RASS -2 to -3 GU: urethral catheter in place  Creatinine 1.17 (baseline 0.7) BUN 26 K 3.2 Hgb 11.1, MCV 92.8 ABG 7.3/42.6/219/24.5 CT head neck: right MCA posterior/ inferior branch occlusion, chronic left MCA/PCA watershed territory infarct  Resolved Hospital Problem list     Assessment & Plan:   Acute hypoxic respiratory failure Small left pleural effusion Patient was transferred to [facility] for thrombectomy. Following extubation post-procedure she was noted to have respiratory distress and was placed on BiPAP without improvement leading to intubation. ABG showed 7.3/42.6/219/24.5. -fentanyl, propofol with RASS goal of 0 to-1 -VAP bundle -repeat ABG  Acute right thrombotic MCA stroke s/p thrombectomy 8/15 Hx of CVA History of afib not on AC. CT head and neck showed right MCA posterior/ inferior branch occlusion and chronic left MCA. IR completed thrombectomy 8/15.  -MRI brain wo contrast -Left lower extremity U/S -TTE -antithrombotic per neuro IR  ?Cellulitis versus DVT Left lower extremity warm and erythematous. With presentation of stroke, strong suspicion for DVT. With her chronic swelling of lower extremities she is also at higher risk of cellulitis. -  clindamycin IV 600 mg q8 hrs -lower extremity ultrasound  AKI Baseline creatinine around 0.7-0.8. Creatinine increased to 1.17 on presentation  this AM with BUN 26. Likely from decreased PO intake. She recently discontinued chemotherapy from N/V symptoms. -strict I and Os -avoid nephrotoxic medication -UA  Chronic normocytic anemia Baseline Hgb around 11-12 over last month. -trend CBC  Atrial fibrillation, not on ac due to vaginal bleeding She discontinued anticoagulation about a year ago per her son. She has history of prior CVA. Home medications include metoprolol tartrate 25 mg BID. 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  HTN Home medications include losartan 100 mg qd and chlorthalidone 25 mg . Hold home medications  HLD OSA  Best Practice (right click and "Reselect all SmartList Selections" daily)   Diet/type: NPO DVT prophylaxis: LMWH GI prophylaxis: PPI Lines: N/A Foley:  Yes, and it is still needed Code Status:  limited Last date of multidisciplinary goals of care discussion [son updated at bedside, call placed to April with VM left to call back]  Labs   CBC: Recent Labs  Lab 11/12/2021 0538 11/09/2021 0559 11/01/2021 1331 10/29/2021 1525  WBC 8.7  --   --  10.2  NEUTROABS 6.9  --   --   --   HGB 11.1* 11.6* 10.5* 10.7*  HCT 34.9* 34.0* 31.0* 34.0*  MCV 92.8  --   --  92.1  PLT 219  --   --  638    Basic Metabolic Panel: Recent Labs  Lab 11/15/2021 0538 10/30/2021 0559 11/15/2021 1331  NA 138 138 138  K 3.2* 3.0* 3.2*  CL 104 99  --   CO2 26  --   --   GLUCOSE 118* 112*  --   BUN 26* 22  --   CREATININE 1.17* 1.20*  --   CALCIUM 8.4*  --   --    GFR: Estimated Creatinine Clearance: 42.8 mL/min (A) (by C-G formula based on SCr of 1.2 mg/dL (H)). Recent Labs  Lab 11/13/2021 0538 10/23/2021 1525  WBC 8.7 10.2    Liver Function Tests: Recent Labs  Lab 11/02/2021 0538  AST 25  ALT 19  ALKPHOS 84  BILITOT 1.2  PROT 5.7*  ALBUMIN 2.8*   No results for input(s): "LIPASE", "AMYLASE" in  the last 168 hours. No results for input(s): "AMMONIA" in the last 168 hours.  ABG    Component Value Date/Time   PHART 7.367 11/01/2021 1331   PCO2ART 42.6 11/09/2021 1331   PO2ART 219 (H) 11/20/2021 1331   HCO3 24.5 10/30/2021 1331   TCO2 26 10/26/2021 1331   ACIDBASEDEF 1.0 10/27/2021 1331   O2SAT 100 10/31/2021 1331     Coagulation Profile: Recent Labs  Lab 11/20/2021 0538  INR 1.3*    Cardiac Enzymes: No results for input(s): "CKTOTAL", "CKMB", "CKMBINDEX", "TROPONINI" in the last 168 hours.  HbA1C: Hgb A1c MFr Bld  Date/Time Value Ref Range Status  11/14/2021 03:25 PM 5.4 4.8 - 5.6 % Final    Comment:    (NOTE) Pre diabetes:          5.7%-6.4%  Diabetes:              >6.4%  Glycemic control for   <7.0% adults with diabetes   10/19/2019 03:30 PM 6.2 (H) 4.8 - 5.6 % Final    Comment:             Prediabetes: 5.7 -  6.4          Diabetes: >6.4          Glycemic control for adults with diabetes: <7.0     CBG: Recent Labs  Lab 10/30/2021 1324  GLUCAP 103*    Past Medical History:  She,  has a past medical history of Allergy, Anxiety, Arthritis, Breast cyst (03/1990), Bronchitis, Cancer ([place]), Cataract, Cervical tab (1972), 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   Surgical History:   Past Surgical History:  Procedure Laterality Date   BREAST BIOPSY Right 06/2020   x2   CATARACT EXTRACTION Right 12/07/2018   CERVICAL [place] SURGERY  04/1991   CESAREAN SECTION  08/1981   with BTL    COLONOSCOPY N/A 03/19/2017   Procedure: COLONOSCOPY;  Surgeon: Doran Stabler, MD;  Location: [facility];  Service: Gastroenterology;  Laterality: N/A;   DILATION AND CURETTAGE OF UTERUS     HYSTEROSCOPY  5/02, 01/2011   JOINT REPLACEMENT     LITHOTRIPSY  08/30/2014   with Dr. Jeffie Pollock at [place] Right 07/28/2021   Procedure: INSERTION PORT-A-CATH;  Surgeon: [name], MD;  Location: [facility];  Service: General;  Laterality: Right;   TOTAL KNEE ARTHROPLASTY Right 08/2006   right   TOTAL KNEE ARTHROPLASTY Left 06/2008   left    TUBAL LIGATION     WRIST SURGERY Left 06/2005   left     Social History:   reports that she has never smoked. She has never used smokeless tobacco. She reports that she does not drink alcohol and does not use drugs.   Family History:  Her family history includes Breast cancer in an other family member; Breast cancer (age of onset: 52 - 90) in her maternal aunt; Cancer in her paternal grandfather; Colon cancer (age of onset: 38) in her maternal uncle; Colon cancer (age of onset: 11 - 44) in her maternal aunt and mother; Ehlers-Danlos syndrome in an other family member; Heart disease in her father and mother. There is no history of Stomach cancer, Pancreatic cancer, Endometrial cancer, or Ovarian cancer.   Allergies Allergies  Allergen Reactions   Bactrim [Sulfamethoxazole-Trimethoprim] Other (See Comments)    Unknown    Ciprofloxacin Other (See Comments)    GI upset   Hydroxyzine Hcl     Other reaction(s): Other (See Comments) Pt becomes physchotic.   Keflet [Cephalexin] Other (See Comments)    GI upset   Lipitor [Atorvastatin]     Muscle cramps   Lisinopril Cough   Spironolactone Nausea Only   Vistaril [Hydroxyzine Hcl] Other (See Comments)    Pt becomes physchotic.   Erythromycin Rash   Latex Rash   Penicillins Rash    Has patient had a PCN reaction causing immediate rash, facial/tongue/throat swelling, SOB or  lightheadedness with hypotension: Yes Has patient had a PCN reaction causing severe rash involving mucus membranes or skin necrosis: No Has patient had a PCN reaction that required hospitalization No Has patient had a PCN reaction occurring within the last 10 years: No If all of the above answers are "NO", then may proceed with Cephalosporin use.    Tape Itching, Rash and Other (See Comments)  Burning   Tetracycline Rash     Home Medications  Prior to Admission medications   Medication Sig Start Date End Date Taking? Authorizing Provider  acetaminophen (TYLENOL) 500 MG tablet Take 1,000 mg by mouth every 6 (six) hours as needed for moderate pain or headache.    Yes [provider]  albuterol (PROAIR HFA) 108 (90 Base) MCG/ACT inhaler INHALE TWO PUFFS BY MOUTH EVERY 6 HOURS AS NEEDED FOR WHEEZING AND SHORTNESS OF BREATH 11/23/19  Yes Young, Clinton D, MD  ALPRAZolam Duanne Moron) 0.25 MG tablet Take 0.25 mg by mouth daily as needed (peripheral neuropathy). 07/25/19  Yes [provider]  chlorthalidone (HYGROTON) 25 MG tablet TAKE ONE TABLET BY MOUTH DAILY. 11/12/20  Yes Verta Ellen., NP  cholecalciferol (VITAMIN D) 1000 UNITS tablet Take 1,000 Units by mouth every morning.    Yes [provider]  guaiFENesin (MUCINEX) 600 MG 12 hr tablet Take 600 mg by mouth daily as needed for cough.   Yes [provider]  hydrocortisone cream 1 % Apply 1 application  topically daily. Mixed with clotrimazole cream. Washing in nizoral shampoo Applying 50/50 mix to lower abdomen   Yes [provider]  losartan (COZAAR) 100 MG tablet TAKE ONE TABLET BY MOUTH DAILY. 11/12/20  Yes Verta Ellen., NP  metoprolol tartrate (LOPRESSOR) 25 MG tablet TAKE ONE TABLET BY MOUTH TWICE DAILY, MAY TAKE ONE EXTRA TABLET AS NEEDED FOR PALPITATIONS 06/12/21  Yes Branch, Alphonse Guild, MD  Multiple Vitamins-Minerals (MULTIVITAMIN WITH MINERALS) tablet Take 1 tablet by mouth daily. Woman's  50 +   Yes [provider]  nitrofurantoin, macrocrystal-monohydrate, (MACROBID) 100 MG capsule Take 100 mg by mouth 2 (two) times daily. 10/31/21  Yes [provider]  nystatin (MYCOSTATIN/NYSTOP) powder Apply 1 application  topically 3 (three) times daily. 08/29/21  Yes Dede Query T, PA-C  ondansetron (ZOFRAN) 8 MG tablet Take 8 mg by mouth every 8 (eight) hours as needed for nausea or vomiting.   Yes [provider]  Polyethyl Glycol-Propyl Glycol (SYSTANE OP) Place 1 drop into both eyes daily as needed (dry eyes).    Yes [provider]  potassium chloride 20 MEQ/15ML (10%) SOLN Take 15 mLs (20 mEq total) by mouth daily. 10/16/21 05/14/22 Yes Branch, Alphonse Guild, MD  potassium chloride SA (KLOR-CON) 20 MEQ tablet TAKE ONE TABLET BY MOUTH DAILY. 11/12/20  Yes Verta Ellen., NP  sodium chloride (OCEAN) 0.65 % SOLN nasal spray Place 1 spray into both nostrils as needed for congestion.   Yes [provider]  prochlorperazine (COMPAZINE) 10 MG tablet Take 1 tablet (10 mg total) by mouth every 6 (six) hours as needed (Nausea or vomiting). 07/28/21 10/27/21  [name], MD     Daleen Bo. Jimi Giza, D.O.  Internal Medicine Resident, PGY-2 [facility]  5:24 PM, 11/11/2021

## 2021-11-04 NOTE — Anesthesia Postprocedure Evaluation (Signed)
Anesthesia Post Note  Patient: Dellie Piasecki Grzelak  Procedure(s) Performed: IR WITH ANESTHESIA     Patient location during evaluation: PACU Anesthesia Type: General Level of consciousness: oriented and patient remains intubated per anesthesia plan Vital Signs Assessment: post-procedure vital signs reviewed and stable Respiratory status: patient remains intubated per anesthesia plan and patient on ventilator - see flowsheet for VS Cardiovascular status: blood pressure returned to baseline and stable Postop Assessment: no apparent nausea or vomiting Anesthetic complications: no Comments: Extubated to CPAP in PACU, but continued to have increased respiratory effort so decision was made to reintubate. Stable on ventilator, transferred to medical ICU   No notable events documented.  Last Vitals:  Vitals:   10/21/2021 1140 10/24/2021 1155  BP:    Pulse: (!) 107 85  Resp: (!) 26 (!) 22  Temp:    SpO2: (!) 89% 97%    Last Pain:  Vitals:   11/08/2021 0832  TempSrc: Rectal  PainSc:    Pain Goal:                   Pervis Hocking

## 2021-11-04 NOTE — ED Notes (Signed)
Code stroke order placed to activate neurology. Pt has already been to CT and evaluated by MD. Pt had tele neurology assessment at arrival.

## 2021-11-04 NOTE — Consult Note (Addendum)
[facility] TeleNeurology Consult Services  Stat Consult  Patient Name:   Nicole Cabrera, Nicole Cabrera Date of Birth:   03/17/1946 Identification Number:   MRN - [id number] Date of Service:   11/11/2021 05:47:25  Diagnosis:       R53.1 - Weakness  Impression Patient with h/o CVA, breast CA with mets to thoracic spine, sternum, afib not on AC, presents with L sided weakness and seizures. On exam, has R gaze preference and L sided weakness. Symptoms concerning for stroke. Differential includes seizure with paralysis, brain metastasis, ischemic or hemorrhagic infarction. She is not a candidate for IV thrombolytics due to being outside the therapeutic window. In discussion with her husband, she had good mobility previously (MRS 2) and will obtain CTA/P to assess for LVO. CTH also still needed. From seizure perspective, will start [place].   Recommendations: Our recommendations are outlined below.  Diagnostic Studies : CTA head and neck with contrast study/results pending  Laboratory Studies : Recommend Lipid panel Please order Hemoglobin A1c Please order  Antithrombotic Medications : Hold antithrombotics for now.Aspirin 300 mg PR daily  Nursing Recommendations : Telemetry, IV Fluids, avoid dextrose containing fluids, Maintain euglycemiaNeuro checks q4 hrs x 24 hrs and then per shiftHead of bed 30 degrees  Consultations : Recommend Speech therapy if failed dysphagia screenPhysical therapy/Occupational therapy treat witnessed seizures with benzodiazepine PRN  DVT Prophylaxis : Choice of Primary Team  Disposition : Neurology will follow  ----------------------------------------------------------------------------------------------------   Advanced Imaging: Advanced imaging has been ordered. Results pending.    Metrics: [facility] Notification Time: 11/17/2021 05:44:26 Stamp Time: 10/26/2021 05:47:25 Callback Response Time: 11/16/2021 05:48:07   CT  HEAD: Image Lana Fish - Discussed with Radiologist / Reviewed Radiology Report CTH not done yet    ----------------------------------------------------------------------------------------------------  Chief Complaint: L sided weakness  History of Present Illness: Patient is a 76 year old Female. 54F hx CVA, breast CA, Afib not on thinners d/t bleeding issue with chemo. LKW 8:30pm. at 3:30am her husband found pt on floor after she was pounding on the wall, unable to move L side and dysarthric on exam. CTH was not done yet.  3:30 am, husband found that she had fallen out of bed. LKW was 8:30pm. She has a h/o prior stroke had incoherent speech but never had paralysis before. She has had MRI in July with no brain mets. She did have some thoracic spinal mets in the past and also in her sternum.  She has been alert and conscious, but sleeps more. She was mobile and had mostly normal speech. The cancer was discovered this year and chemo started in March.  She has had poor vision with chemo.  Husband also reports some period seizures in her L side. They last about a minute on the L side and then relaxes. At home, she has no mobility problems and has had recovered from speech.   Past Medical History:      Atrial Fibrillation  Medications:  No Anticoagulant use  No Antiplatelet use Reviewed EMR for current medications  Allergies:  Reviewed  Social History: Smoking: No Alcohol Use: No  Family History:  There is no family history of premature cerebrovascular disease pertinent to this consultation  ROS : 14 Points Review of Systems was performed and was negative except mentioned in HPI.  Past Surgical History: There Is No Surgical History Contributory To Today's Visit   Examination: BP(130/74), Pulse(87), 1A: Level of Consciousness - Alert; keenly responsive + 0 1B: Ask Month and Age - Both Questions Right +  0 1C: Blink Eyes & Squeeze Hands - Performs Both Tasks + 0 2:  Test Horizontal Extraocular Movements - Partial Gaze Palsy: Can Be Overcome + 1 3: Test Visual Fields - No Visual Loss + 0 4: Test Facial Palsy (Use [place]imace if Obtunded) - Minor paralysis (flat nasolabial fold, smile asymmetry) + 1 5A: Test Left Arm Motor Drift - No Movement + 4 5B: Test Right Arm Motor Drift - No Drift for 10 Seconds + 0 6A: Test Left Leg Motor Drift - No Effort Against Gravity + 3 6B: Test Right Leg Motor Drift - No Drift for 5 Seconds + 0 7: Test Limb Ataxia (FNF/Heel-Shin) - No Ataxia + 0 8: Test Sensation - Mild-Moderate Loss: Less Sharp/More Dull + 1 9: Test Language/Aphasia - Mild-Moderate Aphasia: Some Obvious Changes, Without Significant Limitation + 1 10: Test Dysarthria - Mild-Moderate Dysarthria: Slurring but can be understood + 1 11: Test Extinction/Inattention - No abnormality + 0  NIHSS Score: 12     Patient / Family was informed the Neurology Consult would occur via TeleHealth consult by way of interactive audio and video telecommunications and consented to receiving care in this manner.  Patient is being evaluated for possible acute neurologic impairment and high probability of imminent or life - threatening deterioration.I spent total of 35 minutes providing care to this patient, including time for face to face visit via telemedicine, review of medical records, imaging studies and discussion of findings with providers, the patient and / or family.   Dr Alinda Dooms   [facility] For Inpatient follow-up with [facility] physician please call [facility] [phone]. This is not an outpatient service. Post hospital discharge, please contact hospital directly.   ADDENDUM:  Advanced Imaging: CTA Head and Neck Completed.  CT perfusion Completed.  LVO: Yes, R MCA M2 branch occlusion at trifurcation.  I updated Dr Eulis Foster who has taken over care of the patient. Her husband was previously reticent for invasive procedure but asked that we proceed with CTA  and then decide. Dr Eulis Foster to discuss goals of care with patient's husband before calling NIR.  He will circle back with patient to see if they would wish to proceed and then call me back.  Dr Eulis Foster called me back and stated patient wishes to proceed and I spoke with NIR through carelink.    Discussed with NIR :Yes  Initial Call Time To NIR : 11/12/2021 07:58:03  NIR Accept/Decision Time : 11/19/2021 08:12:18  Discussed with NIR Time : 11/11/2021 08:08:01  Discussed with NIR Text : de [place] NIR at [facility] accepted the case.  Neurointerventionalist Accepted Case : Case accepted for intervention.

## 2021-11-04 NOTE — ED Notes (Addendum)
Pt departed via [facility] emergency traffic. Report was given to transport, IR and RR RN at [facility]. Care of pt was reported and handed off to [facility] at [place]. IR and RR of [place] care team are aware of pt condition and aware pt is in route to [facility] facility. This pt is out of obs/care of this RN and [facility]. Pt and report were accepted by receiving departments.

## 2021-11-04 NOTE — ED Provider Notes (Signed)
8:45 AM-patient discussed with Dr. Bridgett Larsson of teleneurology.  We discussed the CTA read with personal fusion results, indicating acute right brain stroke with LVO.  Patient has a penumbra indicating need for interventional radiology treatment.  8:50 AM-have discussed with patient's husband who agrees to proceed with IR intervention.  9:20 AM-Case discussed again with Dr. Bridgett Larsson, he has discussed the case with Dr.de Mont Dutton from IR, who will accept for intervention.  Dr. Bridgett Larsson will call the [facility] neurologist.  We will proceed with urgent EMS transfer.   Daleen Bo, MD 10/26/2021 [phone]

## 2021-11-04 NOTE — Progress Notes (Signed)
SLP Cancellation Note  Patient Details Name: Nicole Cabrera MRN: [id number] DOB: 08/08/1945   Cancelled treatment:       Reason Eval/Treat Not Completed: Medical issues which prohibited therapy;Other (comment) (patient remains intubated. SLP will follow for readiness.)  Sonia Baller, MA, CCC-SLP Speech Therapy

## 2021-11-04 NOTE — Procedures (Signed)
INTERVENTIONAL NEURORADIOLOGY BRIEF POSTPROCEDURE NOTE  DIAGNOSTIC CEREBRAL ANGIOGRAM AND MECHANICAL THROMBECTOMY  Attending: Dr. Pedro Earls  Diagnosis: Right M2/MCA occlusion.  Access site: RCFA  Access closure: Perclose Prostyle  Anesthesia: GETA  Medication used: refer to anesthesia documentation.  Complications: None.  Estimated blood loss: 100 mL  Specimen: None.  Findings: Occlusion of the right M2/MCA posterior parietal branch occlusion. Mechanical thrombectomy performed with 1 direct contact aspiration and 3 stent retriever passes achieving TICI 2C. No hemorrhagic complication on flat panel head CT.  The patient tolerated the procedure well without incident or complication and is in stable condition.   PLAN: - Bed rest x 6 hours - SBP 120-140 mmHg.

## 2021-11-04 NOTE — Plan of Care (Signed)
Attempted to call POA April to discuss Nicole Cabrera's care. L/m for her to please call back. [phone]  [name], DO 11/17/2021 4:26 PM [facility]

## 2021-11-04 NOTE — ED Triage Notes (Signed)
Family called ems due to pt being more weak on the left side. Pt usually has left sided weakness but more flaccid this am. Pt LKN was at 2030 last night. Pt has history of previous stroke. Per ems pt was lying in floor by bed at home.

## 2021-11-04 NOTE — Progress Notes (Signed)
Patient was transported form the PACU to 1M13 without any complications. Report was given to 1M RT.

## 2021-11-04 NOTE — Progress Notes (Signed)
Dr. Karenann Cai requested this RN call carelink to inquire about code stroke page because she has not received the page yet. Called carelink, [place] states that she was not advised to call code stroke. Information given so that code stroke/code IR can be paged out.

## 2021-11-04 NOTE — Anesthesia Preprocedure Evaluation (Addendum)
Anesthesia Evaluation  Patient identified by MRN, date of birth, ID band Patient awake    Reviewed: Allergy & Precautions, NPO status , Patient's Chart, lab work & pertinent test results, reviewed documented beta blocker date and time   Airway Mallampati: II  TM Distance: >3 FB Neck ROM: Full    Dental no notable dental hx.    Pulmonary asthma , sleep apnea and Continuous Positive Airway Pressure Ventilation ,    Pulmonary exam normal breath sounds clear to auscultation       Cardiovascular hypertension, Pt. on medications and Pt. on home beta blockers Normal cardiovascular exam+ dysrhythmias (not on A/C) Atrial Fibrillation  Rhythm:Regular Rate:Normal  Echo 08/2021 1. Technically difficult study. Left ventricular ejection fraction, by  estimation, is 60 to 65%. The left ventricle has normal function. Left  ventricular endocardial border not optimally defined to evaluate regional  wall motion. There is mild left  ventricular hypertrophy. Left ventricular diastolic parameters are  indeterminate.  2. Right ventricular systolic function is mildly reduced. The right  ventricular size is normal. There is normal pulmonary artery systolic  pressure. The estimated right ventricular systolic pressure is 85.6 mmHg.  3. Left atrial size was severely dilated.  4. The mitral valve is degenerative. No evidence of mitral valve  regurgitation. Severe mitral annular calcification.  5. The aortic valve was not well visualized. Aortic valve regurgitation  is not visualized. No aortic stenosis is present.  6. The inferior vena cava is normal in size with greater than 50%  respiratory variability, suggesting right atrial pressure of 3 mmHg.    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    GI/Hepatic negative GI ROS, Neg liver ROS,   Endo/Other  diabetes (prediabetic)Morbid  obesityBMI 48  Renal/GU Renal InsufficiencyRenal diseaseCr 1.2  negative genitourinary   Musculoskeletal  (+) Arthritis , Osteoarthritis,    Abdominal   Peds  Hematology  (+) Blood dyscrasia, anemia , Hb 11.1   Anesthesia Other Findings breast CA with mets to thoracic spine, sternum  Reproductive/Obstetrics negative OB ROS                            Anesthesia Physical Anesthesia Plan  ASA: 3 and emergent  Anesthesia Plan: General   Post-op Pain Management:    Induction: Intravenous  PONV Risk Score and Plan: 3 and Treatment may vary due to age or medical condition  Airway Management Planned: Oral ETT  Additional Equipment: Arterial line  Intra-op Plan:   Post-operative Plan: Extubation in OR and Possible Post-op intubation/ventilation  Informed Consent: I have reviewed the patients History and Physical, chart, labs and discussed the procedure including the risks, benefits and alternatives for the proposed anesthesia with the patient or authorized representative who has indicated his/her understanding and acceptance.     Dental advisory given and Only emergency history available  Plan Discussed with: CRNA  Anesthesia Plan Comments:         Anesthesia Quick Evaluation

## 2021-11-04 NOTE — ED Notes (Signed)
EMS at bedside

## 2021-11-05 ENCOUNTER — Encounter (HOSPITAL_COMMUNITY): Payer: Self-pay | Admitting: Radiology

## 2021-11-05 ENCOUNTER — Inpatient Hospital Stay (HOSPITAL_COMMUNITY): Payer: BC Managed Care – PPO

## 2021-11-05 DIAGNOSIS — I63511 Cerebral infarction due to unspecified occlusion or stenosis of right middle cerebral artery: Secondary | ICD-10-CM

## 2021-11-05 DIAGNOSIS — M7989 Other specified soft tissue disorders: Secondary | ICD-10-CM | POA: Diagnosis not present

## 2021-11-05 DIAGNOSIS — Z9911 Dependence on respirator [ventilator] status: Secondary | ICD-10-CM | POA: Diagnosis not present

## 2021-11-05 DIAGNOSIS — J9601 Acute respiratory failure with hypoxia: Secondary | ICD-10-CM | POA: Diagnosis not present

## 2021-11-05 DIAGNOSIS — Z7189 Other specified counseling: Secondary | ICD-10-CM

## 2021-11-05 LAB — CBC
HCT: 30.6 % — ABNORMAL LOW (ref 36.0–46.0)
Hemoglobin: 9.8 g/dL — ABNORMAL LOW (ref 12.0–15.0)
MCH: 29.9 pg (ref 26.0–34.0)
MCHC: 32 g/dL (ref 30.0–36.0)
MCV: 93.3 fL (ref 80.0–100.0)
Platelets: 191 10*3/uL (ref 150–400)
RBC: 3.28 MIL/uL — ABNORMAL LOW (ref 3.87–5.11)
RDW: 16 % — ABNORMAL HIGH (ref 11.5–15.5)
WBC: 8.6 10*3/uL (ref 4.0–10.5)
nRBC: 0 % (ref 0.0–0.2)

## 2021-11-05 LAB — BASIC METABOLIC PANEL
Anion gap: 10 (ref 5–15)
BUN: 23 mg/dL (ref 8–23)
CO2: 22 mmol/L (ref 22–32)
Calcium: 7.9 mg/dL — ABNORMAL LOW (ref 8.9–10.3)
Chloride: 107 mmol/L (ref 98–111)
Creatinine, Ser: 1.13 mg/dL — ABNORMAL HIGH (ref 0.44–1.00)
GFR, Estimated: 50 mL/min — ABNORMAL LOW (ref >60)
Glucose, Bld: 95 mg/dL (ref 70–99)
Potassium: 3.3 mmol/L — ABNORMAL LOW (ref 3.5–5.1)
Sodium: 139 mmol/L (ref 135–145)

## 2021-11-05 LAB — LIPID PANEL
Cholesterol: 125 mg/dL (ref 0–200)
HDL: 24 mg/dL — ABNORMAL LOW (ref >40)
LDL Cholesterol: 79 mg/dL (ref 0–99)
Total CHOL/HDL Ratio: 5.2 RATIO
Triglycerides: 110 mg/dL (ref <150)
VLDL: 22 mg/dL (ref 0–40)

## 2021-11-05 LAB — MRSA NEXT GEN BY PCR, NASAL: MRSA by PCR Next Gen: NOT DETECTED

## 2021-11-05 LAB — GLUCOSE, CAPILLARY
Glucose-Capillary: 78 mg/dL (ref 70–99)
Glucose-Capillary: 80 mg/dL (ref 70–99)
Glucose-Capillary: 85 mg/dL (ref 70–99)

## 2021-11-05 LAB — MAGNESIUM: Magnesium: 1.8 mg/dL (ref 1.7–2.4)

## 2021-11-05 MED ORDER — POTASSIUM CHLORIDE 10 MEQ/100ML IV SOLN
10.0000 meq | INTRAVENOUS | Status: AC
  Administered 2021-11-05: 10 meq via INTRAVENOUS
  Filled 2021-11-05: qty 100

## 2021-11-05 MED ORDER — MAGNESIUM SULFATE IN D5W 1-5 GM/100ML-% IV SOLN
1.0000 g | Freq: Once | INTRAVENOUS | Status: AC
  Administered 2021-11-05: 1 g via INTRAVENOUS
  Filled 2021-11-05: qty 100

## 2021-11-05 MED ORDER — SODIUM CHLORIDE 0.9 % IV SOLN
2.0000 g | INTRAVENOUS | Status: DC
  Administered 2021-11-05: 2 g via INTRAVENOUS
  Filled 2021-11-05: qty 20

## 2021-11-05 MED ORDER — CEFDINIR 300 MG PO CAPS
300.0000 mg | ORAL_CAPSULE | Freq: Two times a day (BID) | ORAL | Status: DC
Start: 2021-11-05 — End: 2021-11-05
  Filled 2021-11-05 (×2): qty 1

## 2021-11-05 MED ORDER — CLINDAMYCIN PHOSPHATE 600 MG/50ML IV SOLN
600.0000 mg | Freq: Three times a day (TID) | INTRAVENOUS | Status: DC
  Administered 2021-11-05: 600 mg via INTRAVENOUS
  Filled 2021-11-05 (×2): qty 50

## 2021-11-05 MED ORDER — CEFDINIR 300 MG PO CAPS
300.0000 mg | ORAL_CAPSULE | Freq: Two times a day (BID) | ORAL | Status: DC
  Filled 2021-11-05: qty 1

## 2021-11-05 MED ORDER — PANTOPRAZOLE 2 MG/ML SUSPENSION
40.0000 mg | Freq: Every day | ORAL | Status: DC
  Filled 2021-11-05 (×2): qty 20

## 2021-11-05 MED ORDER — SENNOSIDES-DOCUSATE SODIUM 8.6-50 MG PO TABS: 1.0000 | ORAL_TABLET | Freq: Every evening | ORAL | Status: DC | PRN

## 2021-11-05 NOTE — Progress Notes (Signed)
PT Cancellation Note  Patient Details Name: SIERA BEYERSDORF MRN: [id number] DOB: Mar 05, 1946   Cancelled Treatment:    Reason Eval/Treat Not Completed: Medical issues which prohibited therapy.  On vent, not following commands.  Hold per RN. 11/05/2021  Ginger Carne., PT [facility] [phone]  (pager) [phone]  (office)    Tessie Fass Daysia Vandenboom 11/05/2021, 11:20 AM

## 2021-11-05 NOTE — Progress Notes (Signed)
Pt transported to and from MRI on the ventilator without incident. 

## 2021-11-05 NOTE — Progress Notes (Addendum)
STROKE TEAM PROGRESS NOTE   Brief HPI: Nicole Cabrera is a 76 y.o. female with PMH significant for atrial fibrillation not on anticoagulation due to history of vaginal bleeding on Eliquis, obstructive sleep apnea, prior strokes and TIAs, prediabetes, history of breast cancer and on immunotherapy, nephrolithiasis, hypertension who initially presented to [facility] with acute onset left-sided weakness, right gaze deviation, neglect.  Per husband over phone, she went to bed at 8:30 PM on 11/03/2021 and he heard her pounding on her bed at 3:30 AM in the morning on 11/09/2021.  He open the door and went to her room and found that she was wedged between the bed and the nightstand.  Husband noted that she was having trouble controlling her left side and her speech was slurred and incoherent.  They called EMS and she was transported to [facility].  Positive for a emergent LVO in the right MCA posterior/inferior M2 branch with decreased vessel density in the posterior MCA distribution noted on the CTA along with a perfusion mismatch in that area. NIHSS 21   INTERVAL HISTORY Her family is at the bedside.  Just back from MRI. MRI brain with large MCA infarct with petechial hemorrhage and cytotoxic edema with mild mass effect.   Intubated on ventilator sedation off since this am. She is drowsy but can be aroused and is resting comfortably in NAD. Neuro exam-Following commands. Opens eyes to voice. Right gaze preference unable to cross, no blink to threat on the left side. Follows commands on right side no movement on left to noxious stimuli,  side left side flaccid  Will Keep IVF going.   Family updated and shown MRI results at bedside by Dr. Leonie Cabrera, all questions were answered and family voiced understanding.  Family stated patient was already having a poor quality of life and dealing with her metastatic cancer and its treatment effect and would not want to go to the next.  Family wishing to pursue  comfort measures, will put in Palliative consult.  Discussed with Dr. Ernest Cabrera critical care medicine.  Vitals:   11/05/21 0530 11/05/21 0600 11/05/21 0630 11/05/21 0716  BP: 103/78 111/65 105/67   Pulse: 92 88 90 89  Resp: (!) 22 16 (!) 22 (!) 22  Temp:      TempSrc:      SpO2: 100% 100% 100% 100%  Weight:      Height:       CBC:  Recent Labs  Lab 11/19/2021 0538 11/05/2021 0559 11/14/2021 1331 10/28/2021 1525  WBC 8.7  --   --  10.2  NEUTROABS 6.9  --   --   --   HGB 11.1*   < > 10.5* 10.7*  HCT 34.9*   < > 31.0* 34.0*  MCV 92.8  --   --  92.1  PLT 219  --   --  209   < > = values in this interval not displayed.   Basic Metabolic Panel:  Recent Labs  Lab 11/01/2021 0538 11/03/2021 0559 11/15/2021 1331 10/31/2021 1525  NA 138 138 138  --   K 3.2* 3.0* 3.2*  --   CL 104 99  --   --   CO2 26  --   --   --   GLUCOSE 118* 112*  --   --   BUN 26* 22  --   --   CREATININE 1.17* 1.20*  --  1.15*  CALCIUM 8.4*  --   --   --  HgbA1c:  Recent Labs  Lab 11/18/2021 1525  HGBA1C 5.4   Urine Drug Screen: No results for input(s): "LABOPIA", "COCAINSCRNUR", "LABBENZ", "AMPHETMU", "THCU", "LABBARB" in the last 168 hours.  Alcohol Level  Recent Labs  Lab 10/28/2021 0538  ETH <10    IMAGING past 24 hours IR PERCUTANEOUS ART THROMBECTOMY/INFUSION INTRACRANIAL INC DIAG ANGIO  Result Date: 11/17/2021 INDICATION: Nicole Cabrera is a 76 year old female with past medical history significant for atrial fibrillation not on anticoagulation due to history of vaginal bleeding on Eliquis, obstructive sleep apnea, prior strokes and TIAs, prediabetes, history of breast cancer and on immunotherapy, nephrolithiasis, and hypertension who presented to [facility] with acute onset left-sided weakness, right gaze deviation, neglect with NIHSS 21. Her last known well was 08:30 p.m. on 11/03/2021. Baseline modified Rankin scale 2. Head CT showed hypodensity in the right frontal and insular regions  (ASPECTS 8). CT angiogram of the head and neck showed a proximal right M2/MCA occlusion. No thrombolytic administered as she was outside the window. She was then transferred to [facility] for a diagnostic cerebral angiogram and mechanical thrombectomy. EXAM: ULTRASOUND-GUIDED VASCULAR ACCESS DIAGNOSTIC CEREBRAL ANGIOGRAM MECHANICAL THROMBECTOMY FLAT PANEL HEAD CT COMPARISON:  CT/CT angiogram of the head and neck November 04, 2021 MEDICATIONS: 5 mg of verapamil intra arterial to right ICA. ANESTHESIA/SEDATION: The procedure was performed in the general anesthesia. CONTRAST:  100 mL of Omnipaque 300 mg/mL FLUOROSCOPY: Radiation Exposure Index (as provided by the fluoroscopic device): 6948 mGy Kerma COMPLICATIONS: None immediate. TECHNIQUE: Informed written consent was obtained from the patient's husband after a thorough discussion of the procedural risks, benefits and alternatives. All questions were addressed. Maximal Sterile Barrier Technique was utilized including caps, mask, sterile gowns, sterile gloves, sterile drape, hand hygiene and skin antiseptic. A timeout was performed prior to the initiation of the procedure. The right groin was prepped and draped in the usual sterile fashion. Using a micropuncture kit and the modified Seldinger technique, access was gained to the right common femoral artery and an 8 French sheath was placed. Real-time ultrasound guidance was utilized for vascular access including the acquisition of a permanent ultrasound image documenting patency of the accessed vessel. Under fluoroscopy, a Zoom 88 guide catheter was navigated over a 6 [place] Berenstein 2 catheter and a 0.035" Terumo Glidewire into the aortic arch. The catheter was placed into the right common carotid artery and then advanced into the right internal carotid artery. The diagnostic catheter was removed. Frontal and lateral angiograms of the head were obtained. FINDINGS: 1. Normal caliber of the right common femoral artery,  adequate for vascular access. 2. Severe tortuosity of the cervical segment of the right ICA without hemodynamically significant stenosis. 3. Occlusion of a proximal M2/MCA posterior division branch to the posterior parietal region. PROCEDURE: Using biplane roadmap, a Zoom 35 aspiration catheter was navigated over an Aristotle 24 microguidewire into the cavernous segment of the right ICA. The aspiration catheter was then advanced to the level of occlusion and connected to an aspiration pump. Continuous aspiration was performed for 2 minutes. The guide catheter was connected to a VacLok syringe. The aspiration catheter was subsequently removed under constant aspiration. The guide catheter was aspirated for debris. Right internal carotid artery angiograms showed refer fusion of the proximal aspect of the M2 branch with persistent distal occlusion. Using biplane roadmap guidance, a phenom 21 microcatheter was navigated over an Aristotle 14 microguidewire into the cavernous segment of the right ICA. The microcatheter was then navigated over the wire  into the left M2/MCA. Then, a 3 mm solitaire stent retriever was deployed spanning the M2 segment. The device was allowed to intercalated with the clot for 4 minutes. The microcatheter was removed. The thrombectomy device was removed under constant aspiration. Right internal carotid artery angiogram showed persistent occlusion of the posterior division branch to the posterior parietal region. Using biplane roadmap guidance, a zoom 55 aspiration catheter was navigated over a phenom 21 microcatheter and a Aristotle 14 microguidewire into the cavernous segment of the right ICA. The microcatheter was then navigated over the wire into the left M2/MCA posterior division branch. Then, a 3 mm solitaire stent retriever was deployed spanning the M2 segment. The device was allowed to intercalated with the clot for 4 minutes. The microcatheter was removed. The aspiration catheter was  advanced to the level of occlusion and connected to an aspiration pump. The guiding catheter balloon was inflated. The thrombectomy device and aspiration catheter were removed under constant aspiration. Right internal carotid artery angiogram showed persistent occlusion of the posterior division branch to the posterior parietal region. Using biplane roadmap guidance, a zoom 55 aspiration catheter was navigated over a phenom 21 microcatheter and a Aristotle 14 microguidewire into the cavernous segment of the right ICA. The microcatheter was then navigated over the wire into the left M2/MCA posterior division branch. Then, a 3 mm solitaire stent retriever was deployed spanning the M2 segment. The device was allowed to intercalated with the clot for 4 minutes. The microcatheter was removed. The aspiration catheter was advanced to the level of occlusion and connected to an aspiration pump. The guiding catheter balloon was inflated. The thrombectomy device and aspiration catheter were removed under constant aspiration. Right internal carotid artery angiogram showed recanalization of the M2/MCA posterior division branch with slow distal flow (TICI 2C). Flat panel CT of the head was obtained and post processed in a separate workstation with concurrent attending physician supervision. Selected images were sent to PACS. No evidence of hemorrhagic complication. Delay right internal carotid artery angiogram showed similar pattern of recanalization of the posterior division with distal slow flow. The catheter was subsequently withdrawn. Right common femoral artery angiogram was obtained in right anterior oblique view. The puncture is at the level of the common femoral artery. The artery has normal caliber, adequate for closure device. The sheath was exchanged over the wire for a Perclose prostyle which was utilized for access closure. Immediate hemostasis was achieved. IMPRESSION: 1. Mechanical thrombectomy treatment of an  occlusion of a posterior division M2/MCA branch to the posterior parietal region achieving TICI 2C recanalization. 2. No hemorrhagic complication on postprocedural flat panel CT. PLAN: Transfer to ICU for continued post stroke care. Electronically Signed   By: Pedro Earls M.D.   On: 11/06/2021 16:34   IR CT Head Ltd  Result Date: 11/13/2021 INDICATION: Nicole Cabrera is a 76 year old female with past medical history significant for atrial fibrillation not on anticoagulation due to history of vaginal bleeding on Eliquis, obstructive sleep apnea, prior strokes and TIAs, prediabetes, history of breast cancer and on immunotherapy, nephrolithiasis, and hypertension who presented to [facility] with acute onset left-sided weakness, right gaze deviation, neglect with NIHSS 21. Her last known well was 08:30 p.m. on 11/03/2021. Baseline modified Rankin scale 2. Head CT showed hypodensity in the right frontal and insular regions (ASPECTS 8). CT angiogram of the head and neck showed a proximal right M2/MCA occlusion. No thrombolytic administered as she was outside the window. She was then  transferred to [facility] for a diagnostic cerebral angiogram and mechanical thrombectomy. EXAM: ULTRASOUND-GUIDED VASCULAR ACCESS DIAGNOSTIC CEREBRAL ANGIOGRAM MECHANICAL THROMBECTOMY FLAT PANEL HEAD CT COMPARISON:  CT/CT angiogram of the head and neck November 04, 2021 MEDICATIONS: 5 mg of verapamil intra arterial to right ICA. ANESTHESIA/SEDATION: The procedure was performed in the general anesthesia. CONTRAST:  100 mL of Omnipaque 300 mg/mL FLUOROSCOPY: Radiation Exposure Index (as provided by the fluoroscopic device): 2423 mGy Kerma COMPLICATIONS: None immediate. TECHNIQUE: Informed written consent was obtained from the patient's husband after a thorough discussion of the procedural risks, benefits and alternatives. All questions were addressed. Maximal Sterile Barrier Technique was utilized including caps,  mask, sterile gowns, sterile gloves, sterile drape, hand hygiene and skin antiseptic. A timeout was performed prior to the initiation of the procedure. The right groin was prepped and draped in the usual sterile fashion. Using a micropuncture kit and the modified Seldinger technique, access was gained to the right common femoral artery and an 8 French sheath was placed. Real-time ultrasound guidance was utilized for vascular access including the acquisition of a permanent ultrasound image documenting patency of the accessed vessel. Under fluoroscopy, a Zoom 88 guide catheter was navigated over a 6 [place] Berenstein 2 catheter and a 0.035" Terumo Glidewire into the aortic arch. The catheter was placed into the right common carotid artery and then advanced into the right internal carotid artery. The diagnostic catheter was removed. Frontal and lateral angiograms of the head were obtained. FINDINGS: 1. Normal caliber of the right common femoral artery, adequate for vascular access. 2. Severe tortuosity of the cervical segment of the right ICA without hemodynamically significant stenosis. 3. Occlusion of a proximal M2/MCA posterior division branch to the posterior parietal region. PROCEDURE: Using biplane roadmap, a Zoom 35 aspiration catheter was navigated over an Aristotle 24 microguidewire into the cavernous segment of the right ICA. The aspiration catheter was then advanced to the level of occlusion and connected to an aspiration pump. Continuous aspiration was performed for 2 minutes. The guide catheter was connected to a VacLok syringe. The aspiration catheter was subsequently removed under constant aspiration. The guide catheter was aspirated for debris. Right internal carotid artery angiograms showed refer fusion of the proximal aspect of the M2 branch with persistent distal occlusion. Using biplane roadmap guidance, a phenom 21 microcatheter was navigated over an Aristotle 14 microguidewire into the cavernous  segment of the right ICA. The microcatheter was then navigated over the wire into the left M2/MCA. Then, a 3 mm solitaire stent retriever was deployed spanning the M2 segment. The device was allowed to intercalated with the clot for 4 minutes. The microcatheter was removed. The thrombectomy device was removed under constant aspiration. Right internal carotid artery angiogram showed persistent occlusion of the posterior division branch to the posterior parietal region. Using biplane roadmap guidance, a zoom 55 aspiration catheter was navigated over a phenom 21 microcatheter and a Aristotle 14 microguidewire into the cavernous segment of the right ICA. The microcatheter was then navigated over the wire into the left M2/MCA posterior division branch. Then, a 3 mm solitaire stent retriever was deployed spanning the M2 segment. The device was allowed to intercalated with the clot for 4 minutes. The microcatheter was removed. The aspiration catheter was advanced to the level of occlusion and connected to an aspiration pump. The guiding catheter balloon was inflated. The thrombectomy device and aspiration catheter were removed under constant aspiration. Right internal carotid artery angiogram showed persistent occlusion of the posterior division branch  to the posterior parietal region. Using biplane roadmap guidance, a zoom 55 aspiration catheter was navigated over a phenom 21 microcatheter and a Aristotle 14 microguidewire into the cavernous segment of the right ICA. The microcatheter was then navigated over the wire into the left M2/MCA posterior division branch. Then, a 3 mm solitaire stent retriever was deployed spanning the M2 segment. The device was allowed to intercalated with the clot for 4 minutes. The microcatheter was removed. The aspiration catheter was advanced to the level of occlusion and connected to an aspiration pump. The guiding catheter balloon was inflated. The thrombectomy device and aspiration  catheter were removed under constant aspiration. Right internal carotid artery angiogram showed recanalization of the M2/MCA posterior division branch with slow distal flow (TICI 2C). Flat panel CT of the head was obtained and post processed in a separate workstation with concurrent attending physician supervision. Selected images were sent to PACS. No evidence of hemorrhagic complication. Delay right internal carotid artery angiogram showed similar pattern of recanalization of the posterior division with distal slow flow. The catheter was subsequently withdrawn. Right common femoral artery angiogram was obtained in right anterior oblique view. The puncture is at the level of the common femoral artery. The artery has normal caliber, adequate for closure device. The sheath was exchanged over the wire for a Perclose prostyle which was utilized for access closure. Immediate hemostasis was achieved. IMPRESSION: 1. Mechanical thrombectomy treatment of an occlusion of a posterior division M2/MCA branch to the posterior parietal region achieving TICI 2C recanalization. 2. No hemorrhagic complication on postprocedural flat panel CT. PLAN: Transfer to ICU for continued post stroke care. Electronically Signed   By: Pedro Earls M.D.   On: 10/22/2021 16:34   IR US Guide Vasc Access Right  Result Date: 11/14/2021 INDICATION: Nicole Cabrera is a 76 year old female with past medical history significant for atrial fibrillation not on anticoagulation due to history of vaginal bleeding on Eliquis, obstructive sleep apnea, prior strokes and TIAs, prediabetes, history of breast cancer and on immunotherapy, nephrolithiasis, and hypertension who presented to [facility] with acute onset left-sided weakness, right gaze deviation, neglect with NIHSS 21. Her last known well was 08:30 p.m. on 11/03/2021. Baseline modified Rankin scale 2. Head CT showed hypodensity in the right frontal and insular regions  (ASPECTS 8). CT angiogram of the head and neck showed a proximal right M2/MCA occlusion. No thrombolytic administered as she was outside the window. She was then transferred to [facility] for a diagnostic cerebral angiogram and mechanical thrombectomy. EXAM: ULTRASOUND-GUIDED VASCULAR ACCESS DIAGNOSTIC CEREBRAL ANGIOGRAM MECHANICAL THROMBECTOMY FLAT PANEL HEAD CT COMPARISON:  CT/CT angiogram of the head and neck November 04, 2021 MEDICATIONS: 5 mg of verapamil intra arterial to right ICA. ANESTHESIA/SEDATION: The procedure was performed in the general anesthesia. CONTRAST:  100 mL of Omnipaque 300 mg/mL FLUOROSCOPY: Radiation Exposure Index (as provided by the fluoroscopic device): 3662 mGy Kerma COMPLICATIONS: None immediate. TECHNIQUE: Informed written consent was obtained from the patient's husband after a thorough discussion of the procedural risks, benefits and alternatives. All questions were addressed. Maximal Sterile Barrier Technique was utilized including caps, mask, sterile gowns, sterile gloves, sterile drape, hand hygiene and skin antiseptic. A timeout was performed prior to the initiation of the procedure. The right groin was prepped and draped in the usual sterile fashion. Using a micropuncture kit and the modified Seldinger technique, access was gained to the right common femoral artery and an 8 French sheath was placed. Real-time ultrasound guidance  was utilized for vascular access including the acquisition of a permanent ultrasound image documenting patency of the accessed vessel. Under fluoroscopy, a Zoom 88 guide catheter was navigated over a 6 [place] Berenstein 2 catheter and a 0.035" Terumo Glidewire into the aortic arch. The catheter was placed into the right common carotid artery and then advanced into the right internal carotid artery. The diagnostic catheter was removed. Frontal and lateral angiograms of the head were obtained. FINDINGS: 1. Normal caliber of the right common femoral artery,  adequate for vascular access. 2. Severe tortuosity of the cervical segment of the right ICA without hemodynamically significant stenosis. 3. Occlusion of a proximal M2/MCA posterior division branch to the posterior parietal region. PROCEDURE: Using biplane roadmap, a Zoom 35 aspiration catheter was navigated over an Aristotle 24 microguidewire into the cavernous segment of the right ICA. The aspiration catheter was then advanced to the level of occlusion and connected to an aspiration pump. Continuous aspiration was performed for 2 minutes. The guide catheter was connected to a VacLok syringe. The aspiration catheter was subsequently removed under constant aspiration. The guide catheter was aspirated for debris. Right internal carotid artery angiograms showed refer fusion of the proximal aspect of the M2 branch with persistent distal occlusion. Using biplane roadmap guidance, a phenom 21 microcatheter was navigated over an Aristotle 14 microguidewire into the cavernous segment of the right ICA. The microcatheter was then navigated over the wire into the left M2/MCA. Then, a 3 mm solitaire stent retriever was deployed spanning the M2 segment. The device was allowed to intercalated with the clot for 4 minutes. The microcatheter was removed. The thrombectomy device was removed under constant aspiration. Right internal carotid artery angiogram showed persistent occlusion of the posterior division branch to the posterior parietal region. Using biplane roadmap guidance, a zoom 55 aspiration catheter was navigated over a phenom 21 microcatheter and a Aristotle 14 microguidewire into the cavernous segment of the right ICA. The microcatheter was then navigated over the wire into the left M2/MCA posterior division branch. Then, a 3 mm solitaire stent retriever was deployed spanning the M2 segment. The device was allowed to intercalated with the clot for 4 minutes. The microcatheter was removed. The aspiration catheter was  advanced to the level of occlusion and connected to an aspiration pump. The guiding catheter balloon was inflated. The thrombectomy device and aspiration catheter were removed under constant aspiration. Right internal carotid artery angiogram showed persistent occlusion of the posterior division branch to the posterior parietal region. Using biplane roadmap guidance, a zoom 55 aspiration catheter was navigated over a phenom 21 microcatheter and a Aristotle 14 microguidewire into the cavernous segment of the right ICA. The microcatheter was then navigated over the wire into the left M2/MCA posterior division branch. Then, a 3 mm solitaire stent retriever was deployed spanning the M2 segment. The device was allowed to intercalated with the clot for 4 minutes. The microcatheter was removed. The aspiration catheter was advanced to the level of occlusion and connected to an aspiration pump. The guiding catheter balloon was inflated. The thrombectomy device and aspiration catheter were removed under constant aspiration. Right internal carotid artery angiogram showed recanalization of the M2/MCA posterior division branch with slow distal flow (TICI 2C). Flat panel CT of the head was obtained and post processed in a separate workstation with concurrent attending physician supervision. Selected images were sent to PACS. No evidence of hemorrhagic complication. Delay right internal carotid artery angiogram showed similar pattern of recanalization of the posterior division  with distal slow flow. The catheter was subsequently withdrawn. Right common femoral artery angiogram was obtained in right anterior oblique view. The puncture is at the level of the common femoral artery. The artery has normal caliber, adequate for closure device. The sheath was exchanged over the wire for a Perclose prostyle which was utilized for access closure. Immediate hemostasis was achieved. IMPRESSION: 1. Mechanical thrombectomy treatment of an  occlusion of a posterior division M2/MCA branch to the posterior parietal region achieving TICI 2C recanalization. 2. No hemorrhagic complication on postprocedural flat panel CT. PLAN: Transfer to ICU for continued post stroke care. Electronically Signed   By: Pedro Earls M.D.   On: 10/23/2021 16:34   Portable Chest xray  Result Date: 10/29/2021 CLINICAL DATA:  Difficulty breathing, CVA, placement of endotracheal tube EXAM: PORTABLE CHEST 1 VIEW COMPARISON:  CT chest done on 10/08/2021 FINDINGS: Transverse diameter of heart is increased. There is dense calcification in mitral annulus. Central pulmonary vessels are prominent. Increased interstitial markings are seen in the parahilar regions and lower lung fields. Lateral CP angles are indistinct. There is no pneumothorax. Tip of endotracheal tube is 4 cm above the carina. Tip of right IJ chest port is seen in superior vena cava. IMPRESSION: Cardiomegaly. Prominence of central pulmonary vessels and increased interstitial markings in both lungs suggest CHF. Possible small bilateral pleural effusions. Electronically Signed   By: Elmer Picker M.D.   On: 11/12/2021 16:24    PHYSICAL EXAM General, elderly chronically ill Caucasian lady who is intubated not sedated. . Afebrile. Head is nontraumatic. Neck is supple without bruit.    Cardiac exam no murmur or gallop. Lungs are clear to auscultation. Distal pulses are well felt.  . Afebrile. Head is nontraumatic. Neck is supple without bruit.    Cardiac exam no murmur or gallop. Lungs are clear to auscultation. Distal pulses are well felt. Neurological Exam  : she is intubated.  Sedation off.  Drowsy but awakens easily.-Following commands. Opens eyes to voice. Right gaze preference unable to cross, no blink to threat on the left side. Follows commands on right side with antigravity movements.  No movement on left to noxious stimuli,  side left side flaccid   ASSESSMENT/PLAN [name] is a 76 y.o. female with history of  76 y.o. female with PMH significant for atrial fibrillation not on anticoagulation due to history of vaginal bleeding on Eliquis, obstructive sleep apnea, prior strokes and TIAs, prediabetes, history of breast cancer and on immunotherapy, nephrolithiasis, hypertension who initially presented to [facility] with acute onset left-sided weakness, right gaze deviation, neglect.  Per husband over phone, she went to bed at 8:30 PM on 11/03/2021 and he heard her pounding on her bed at 3:30 AM in the morning on 11/17/2021.  He open the door and went to her room and found that she was wedged between the bed and the nightstand.  Husband noted that she was having trouble controlling her left side and her speech was slurred and incoherent.  They called EMS and she was transported to [facility]. Positive for a emergent LVO in the right MCA posterior/inferior M2 branch with decreased vessel density in the posterior MCA distribution noted on the CTA along with a perfusion mismatch in that area. NIHSS 21. To IR for Occlusion of the right M2/MCA posterior parietal branch occlusion with TICI 3 by Dr  Dr. Ethel Rana   Stroke: Large Acute Right MCA ischemic infarct with petechial hemorrhage due  to right M2 occlusion s/p thrombectomy of posterior division M2/MCA branch to the posterior parietal region with TICI 2C in the setting of A fib not on AC  Etiology:  likely cardio embolic due to A fib not on AC and also possibly hypercoagulable due to cancer   CT head Developing infarct in the right posterior PCA territory with an aspects of 8. CTA head & neck with CT perfusion . Positive for ELVO: Right MCA posterior/inferior M2 branch occlusion at the trifurcation. 2. ASPECTS 8 (right insula, M2 segment). CTP detects a portion of that infarct core (3 mL) and estimates ischemic penumbra at 86 mL. 3. No associated hemorrhage or mass effect. 4. Underlying chronic white matter  disease, chronic Left MCA/PCA watershed territory infarct. Cerebral angio -Severe tortuosity of the cervical segment of the right ICA without hemodynamically significant stenosis.3. Occlusion of a proximal M2/MCA posterior division branch to the posterior parietal region.   Post IR CT 1. Mechanical thrombectomy treatment of an occlusion of a posterior division M2/MCA branch to the posterior parietal region achieving TICI 2C recanalization. 2. No hemorrhagic complication on postprocedural flat panel CT.  MRI   1. Relatively large Right MCA territory infarct with basal ganglia petechial hemorrhage (Heidelberg Classification 1B). Cytotoxic edema and mild mass effect. No midline shift. 2. Small acute cortical and subcortical infarct also in the left middle frontal gyrus. 3. Stable brain elsewhere. Chronic left MCA/PCA watershed right cerebellar ischemia.  2D Echo EF 60-65% (6/23) [place] lower extremity NO DVT LDL 100 HgbA1c 5.4 VTE prophylaxis - SCD's    Diet   Diet NPO time specified   aspirin 81 mg daily prior to admission, now on No antithrombotic.  Therapy recommendations:  pending  Disposition:  pending   Hypertension Home meds:  cozaar, metoprolol,  Stable SBP goal 120-140 Long-term BP goal normotensive  Hyperlipidemia Home meds:  none,  LDL 100, goal < 70 High intensity statin not indicated due to allergy to statins   P A fib  Not on AC due to vaginal bleeding Home meds on hold    Other Stroke Risk Factors Advanced Age >/= 62  Obesity, Body mass index is 48.09 kg/m., BMI >/= 30 associated with increased stroke risk, recommend weight loss, diet and exercise as appropriate  Hx stroke/TIA Obstructive sleep apnea, on CPAP at home  Other Active Problems Acute respiratory failure managed by CCM Dysphagia  Hypokalemia 3.3 replaced  AKI Cr 1.13 continue IVF Anemia  Cancer   Hospital day # 1  Beulah Gandy DNP, ACNPC-AG    STROKE MD NOTE :  I have personally obtained  history,examined this patient, reviewed notes, independently viewed imaging studies, participated in medical decision making and plan of care.ROS completed by me personally and pertinent positives fully documented  I have made any additions or clarifications directly to the above note. Agree with note above.  Patient presented with sudden onset of aphasia and right hemiplegia due to right M2 occlusion and was treated with successful mechanical thrombectomy she already had as CTP score of 69 cubic cc and postprocedure MRI scan shows a fairly large right MCA infarct with some petechial hemorrhagic transformation and cytotoxic edema and mild mass effect.  Chances of surviving this without having major disability are very slim and she will likely need 24-hour care possible nursing home care which patient will not want as per family.  She also has metastatic carcinoma of the breast with poor quality of life and family is leaning towards comfort care.  Recommend palliative care consult.  Long discussion with patient and daughter bedside and answered questions.  Discussed with Dr. Ernest Cabrera critical care medicine. This patient is critically ill and at significant risk of neurological worsening, death and care requires constant monitoring of vital signs, hemodynamics,respiratory and cardiac monitoring, extensive review of multiple databases, frequent neurological assessment, discussion with family, other specialists and medical decision making of high complexity.I have made any additions or clarifications directly to the above note.This critical care time does not reflect procedure time, or teaching time or supervisory time of PA/NP/Med Resident etc but could involve care discussion time.  I spent 30 minutes of neurocritical care time  in the care of  this patient.     Antony Contras, MD Medical Director [facility] Pager: [phone] 11/05/2021 3:50 PM  To contact Stroke Continuity provider, please  refer to [email or web address] After hours, contact General Neurology

## 2021-11-05 NOTE — TOC Progression Note (Signed)
Transition of Care [facility]) - Progression Note    Patient Details  Name: CALAYAH GUADARRAMA MRN: [id number] Date of Birth: September 16, 1945  Transition of Care [facility]) CM/SW [place], RN Phone Number:[phone]  11/05/2021, 4:36 PM  Clinical Narrative:     Transition of Care (TOC) Screening Note   Patient Details  Name: AGAM TUOHY Date of Birth: December 29, 1945   Transition of Care [facility]) CM/SW Contact:    Angelita Ingles, RN Phone Number: 11/05/2021, 4:37 PM    Transition of Care Department [facility]) has reviewed patient and no TOC needs have been identified at this time. We will continue to monitor patient advancement through interdisciplinary progression rounds.            Expected Discharge Plan and Services                                                 Social Determinants of Health (SDOH) Interventions    Readmission Risk Interventions     No data to display

## 2021-11-05 NOTE — IPAL (Signed)
  Interdisciplinary Goals of Care Family Meeting   Date carried out:: 11/05/2021  Location of the meeting: Bedside  Member's involved: Physician, Family Member or next of kin, and Other: resident, Nicole Cabrera Power of Attorney or acting medical decision maker: daughter Nicole Cabrera, husband  Discussion: We discussed goals of care for Nicole Cabrera .  I spoke to Nicole Cabrera's husband and daughter Nicole Cabrera. They understand her previous cancer prognosis. She had told her daughter last weekend that "not making a decision is like making a decision", and her daughter said after meeting with hospice was feeling better about that being a possibility for her. We reviewed her brain MRI residents that Nicole Cabrera had previously discussed with them. They understand her stroke is catastrophic and she will not likely survive this admission. Her step-daughter is leaving today to visit from the [employer] and will arrive tomorrow afternoon. Family is planning on terminal extubation on Friday after family have been able to visit. Until then they agree with focusing on comfort and avoiding additional uncomfortable interventions-- no more accuchecks, insulin, or sticks for labs.     Code status: Full DNR  Disposition: In-patient comfort care, planning for terminal extubation 8/18   Time spent for the meeting: 14 min.  Nicole Cabrera 11/05/2021, 12:07 PM

## 2021-11-05 NOTE — Progress Notes (Signed)
OT Cancellation Note  Patient Details Name: Nicole Cabrera MRN: [id number] DOB: Jan 12, 1946   Cancelled Treatment:    Reason Eval/Treat Not Completed: Patient not medically ready. Pt vented and unable to follow any commands at this time. RN requesting hold. OT will follow as pt is medically able to participate in therapies.   [name] 11/05/2021, 9:16 AM

## 2021-11-05 NOTE — TOC CAGE-AID Note (Signed)
Transition of Care [facility]) - CAGE-AID Screening   Patient Details  Name: Nicole Cabrera MRN: [id number] Date of Birth: [birth year]  Transition of Care [facility]) CM/SW Contact:    Coralee Pesa, [place] Phone Number: 11/05/2021, 3:04 PM   Clinical Narrative: Per chart review, pt is not appropriate for CAGE- AID assessment due to intubation.   CAGE-AID Screening: Substance Abuse Screening unable to be completed due to: : Patient unable to participate             Substance Abuse Education Offered: No

## 2021-11-05 NOTE — Progress Notes (Signed)
NAME:  Nicole Cabrera, MRN:  [id number], DOB:  March 23, 1946, LOS: 1 ADMISSION DATE:  10/28/2021, CONSULTATION DATE:  8/15 REFERRING MD:  Lorrin Goodell, CHIEF COMPLAINT:  hypoxia following extubation after thrombectomy   History of Present Illness:  Ms. Dorfman is a 76 year old with PMH of metastatic breast cancer who recently decided to discontinued therapy, atrial fibrillation not on anticoagulation 2/2 to vaginal bleeding, history of prior CVA, and OSA presented to [facility] 8/15 with acute onset left-sdied weakness, right gauze deviation and neglect. Her last known well was at 8:30PM 8/14 and he found her at 3:30AM wedged between her bed and nightstand with speech changes and trouble controlling left side. He called EMS at that time. CT head and neck showed right MCA occlusion. Outside window for thrombolytics. She was transferred to [facility] for thrombectomy.    Following procedure she was extubated to CPAP in PACU then developed increased respiratory effort and required reintubation. PCCM consulted for acute hypoxic respiratory failure.    Pertinent  Medical History  Atrial fibrillation OSA CVA Predm Hx breast cancer on immunotherapy Nephrolithiasis HTN CKD 3a  Significant Hospital Events: Including procedures, antibiotic start and stop dates in addition to other pertinent events   8/15 presented to [facility] with MCA stroke, transferred to [facility] for thrombectomy. Respiratory distress following extubation post-procedure leading to reintubation  Interim History / Subjective:  Glucose in 70 to 80s. Elevated temperature to 100.2.  Objective   Blood pressure 105/67, pulse 90, temperature 99.2 F (37.3 C), temperature source Axillary, resp. rate (!) 22, height '4\' 11"'  (1.499 m), weight 108 kg, last menstrual period 01/22/2011, SpO2 100 %.    Vent Mode: PRVC FiO2 (%):  [40 %-100 %] 40 % Set Rate:  [22 bmp] 22 bmp Vt Set:  [340 mL] 340 mL PEEP:  [5 cmH20] 5 cmH20 Plateau  Pressure:  [14 cmH20-15 cmH20] 15 cmH20   Intake/Output Summary (Last 24 hours) at 11/05/2021 0706 Last data filed at 11/05/2021 0600 Gross per 24 hour  Intake 1844.6 ml  Output 1250 ml  Net 594.6 ml   Filed Weights   11/16/2021 0521  Weight: 108 kg    Examination: General: intubated, sedated HENT: ETT in place Lungs: ventilated lungs sounds bilaterally Cardiovascular: irregularly irregular rhythm, no m/r/g MSK: dimpled skin on left breast Abdomen: non-distended, bowels sounds present, soft Extremities: 2+ non-pitting edema in lower extremities bilaterally, erythema and warmth to left lower extremity Neuro: RASS -2 to -3 GU: urethral catheter in place  Resolved Hospital Problem list     Assessment & Plan:  Acute hypoxic respiratory failure Small left pleural effusion She remains intubated today. She is getting MRI and then will try SBT to see how she does. Thinking that could have had contrast reaction vs inability to manage secretions versus excessive sedation.  -low tidal volume ventilation -fentanyl, propofol with RASS goal of 0 to-1 -VAP bundle -PAD protocol for sedation -Daily SAT and SBT as appropriate   Acute right thrombotic MCA stroke s/p thrombectomy 8/15 Hx of CVA History of afib not on AC. CT head and neck showed right MCA posterior/ inferior branch occlusion and chronic left MCA. IR completed thrombectomy 8/15. -MRI brain wo contrast today -antithrombotic per neuro IR   Cellulitis Left lower extremity warm and erythematous. With presentation of stroke, strong suspicion for DVT.  Lower extremity ultrasound showed no evidence of DVT in lower extremity.  -clindamycin IV 600 mg q8 hrs, 5 days   AKI Baseline creatinine  around 0.7-0.8. Creatinine increased to 1.17 on presentation this AM with BUN 26. Likely from decreased PO intake. She recently discontinued chemotherapy from N/V symptoms. -strict I and Os -avoid nephrotoxic medication -UA pending   Chronic  normocytic anemia Baseline Hgb around 11-12 over last month. -trend CBC   Atrial fibrillation, not on ac due to vaginal bleeding She discontinued anticoagulation about a year ago per her son. She has history of prior CVA. Home medications include metoprolol tartrate 25 mg BID. 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 She has not been on therapy for last 2 weeks and had not come to an official decision about discontinuing treatment yet. She had met with palliative.   HTN Home medications include losartan 100 mg qd and chlorthalidone 25 mg . Hold home medications   HLD OSA  Best Practice (right click and "Reselect all SmartList Selections" daily)   Diet/type: NPO DVT prophylaxis: LMWH GI prophylaxis: PPI Lines: N/A Foley:  Yes, and it is still needed Code Status:  limited Last date of multidisciplinary goals of care discussion [pending]   Jae Skeet M. Cace Osorto, D.O.  Internal Medicine Resident, PGY-2 [facility]

## 2021-11-05 NOTE — Progress Notes (Signed)
eLink Physician-Brief Progress Note Cabrera Name: Nicole Cabrera DOB: February 07, 1946 MRN: [id number]   Date of Service  11/05/2021  HPI/Events of Note  Cabrera on Scott per tube for cellulitis. Cabrera now longer has gastric tube.   eICU Interventions  Plan: D/C Omnicef. Rocephin 2 gm IV now and Q 24 hours.      Intervention Category Major Interventions: Infection - evaluation and management  Florrie Ramires Eugene 11/05/2021, 10:12 PM

## 2021-11-05 NOTE — Progress Notes (Signed)
SLP Cancellation Note  Patient Details Name: Nicole Cabrera MRN: [id number] DOB: 1946-01-08   Cancelled treatment:       Reason Eval/Treat Not Completed: Medical issues which prohibited therapy;Other (comment) (Patient intubated but plan is for comfort care and terminal extubation on Friday after family has been able to visit. SLP to s/o at this time.)   Sonia Baller, MA, CCC-SLP Speech Therapy

## 2021-11-06 ENCOUNTER — Ambulatory Visit: Payer: BC Managed Care – PPO | Admitting: Adult Health

## 2021-11-06 DIAGNOSIS — Z9911 Dependence on respirator [ventilator] status: Secondary | ICD-10-CM | POA: Diagnosis not present

## 2021-11-06 DIAGNOSIS — J9601 Acute respiratory failure with hypoxia: Secondary | ICD-10-CM | POA: Diagnosis not present

## 2021-11-06 DIAGNOSIS — I63511 Cerebral infarction due to unspecified occlusion or stenosis of right middle cerebral artery: Secondary | ICD-10-CM | POA: Diagnosis not present

## 2021-11-06 MED ORDER — FENTANYL 2500MCG IN NS 250ML (10MCG/ML) PREMIX INFUSION
0.0000 ug/h | INTRAVENOUS | Status: DC
  Administered 2021-11-06: 150 ug/h via INTRAVENOUS
  Filled 2021-11-06: qty 250

## 2021-11-06 MED ORDER — ORAL CARE MOUTH RINSE: 15.0000 mL | OROMUCOSAL | Status: DC | PRN

## 2021-11-06 MED ORDER — GLYCOPYRROLATE 1 MG PO TABS: 1.0000 mg | ORAL_TABLET | ORAL | Status: DC | PRN

## 2021-11-06 MED ORDER — ONDANSETRON 4 MG PO TBDP: 4.0000 mg | ORAL_TABLET | Freq: Four times a day (QID) | ORAL | Status: DC | PRN

## 2021-11-06 MED ORDER — FENTANYL BOLUS VIA INFUSION
100.0000 ug | INTRAVENOUS | Status: DC | PRN
  Administered 2021-11-06 – 2021-11-07 (×3): 100 ug via INTRAVENOUS

## 2021-11-06 MED ORDER — DIPHENHYDRAMINE HCL 50 MG/ML IJ SOLN: 25.0000 mg | INTRAMUSCULAR | Status: DC | PRN

## 2021-11-06 MED ORDER — HALOPERIDOL LACTATE 5 MG/ML IJ SOLN
2.5000 mg | INTRAMUSCULAR | Status: DC | PRN
  Administered 2021-11-06: 5 mg via INTRAVENOUS
  Filled 2021-11-06: qty 1

## 2021-11-06 MED ORDER — PANTOPRAZOLE SODIUM 40 MG IV SOLR
40.0000 mg | INTRAVENOUS | Status: DC
  Administered 2021-11-06: 40 mg via INTRAVENOUS
  Filled 2021-11-06: qty 10

## 2021-11-06 MED ORDER — WHITE PETROLATUM EX OINT
TOPICAL_OINTMENT | CUTANEOUS | Status: DC | PRN
Start: 2021-11-06 — End: 2021-11-07
  Filled 2021-11-06: qty 28.35

## 2021-11-06 MED ORDER — ONDANSETRON HCL 4 MG/2ML IJ SOLN: 4.0000 mg | Freq: Four times a day (QID) | INTRAMUSCULAR | Status: DC | PRN

## 2021-11-06 MED ORDER — GLYCOPYRROLATE 0.2 MG/ML IJ SOLN: 0.2000 mg | INTRAMUSCULAR | Status: DC | PRN

## 2021-11-06 MED ORDER — GLYCOPYRROLATE 0.2 MG/ML IJ SOLN
0.2000 mg | INTRAMUSCULAR | Status: DC | PRN
  Administered 2021-11-06 – 2021-11-07 (×4): 0.2 mg via INTRAVENOUS
  Filled 2021-11-06 (×3): qty 1

## 2021-11-06 MED ORDER — POLYVINYL ALCOHOL 1.4 % OP SOLN: 1.0000 [drp] | Freq: Four times a day (QID) | OPHTHALMIC | Status: DC | PRN

## 2021-11-06 MED ORDER — GLYCOPYRROLATE 0.2 MG/ML IJ SOLN
0.2000 mg | INTRAMUSCULAR | Status: DC | PRN
  Administered 2021-11-06 (×2): 0.2 mg via INTRAVENOUS
  Filled 2021-11-06 (×3): qty 1

## 2021-11-06 MED ORDER — MIDAZOLAM HCL 2 MG/2ML IJ SOLN
2.0000 mg | INTRAMUSCULAR | Status: DC | PRN
  Administered 2021-11-06: 2 mg via INTRAVENOUS
  Administered 2021-11-06: 4 mg via INTRAVENOUS
  Filled 2021-11-06: qty 4
  Filled 2021-11-06: qty 2

## 2021-11-06 NOTE — Progress Notes (Signed)
Pt extubated to comfort care. On fent drip. Family at bedside. Pt appears withdrawn and calm. No signs of distress noted.

## 2021-11-06 NOTE — Procedures (Signed)
Extubation Procedure Note  Patient Details:   Name: Nicole Cabrera DOB: 07-23-1945 MRN: [id number]   Airway Documentation:    Vent end date: 11/06/21 Vent end time: 0175    Pt extubated to comfort care with RN and family at bedside. Pt placed on 2l [place] per family request, MD aware.  Cathie Olden 11/06/2021, 12:36 PM

## 2021-11-06 NOTE — Progress Notes (Signed)
Comfort Care pt. No signs of overt distress. Family at bedside. Pt withdrawn. Emotional support given to family.

## 2021-11-06 NOTE — Progress Notes (Addendum)
STROKE TEAM PROGRESS NOTE      INTERVAL HISTORY Patient son and husband 1 more family member at the bedside.  Family is awaiting arrival of 1 more family member from out of state. Patient is full comfort care with terminal extubation tomorrow. NO escalation of care.  Neuro exam limited due to sedation and intubation.  Vital signs show hypoension. Vitals:   11/06/21 0500 11/06/21 0530 11/06/21 0600 11/06/21 0630  BP:  99/64 98/62 (!) 100/50  Pulse: (!) 123 96 87 91  Resp: (!) 30 (!) 22 (!) 22 (!) 22  Temp:      TempSrc:      SpO2: 97% 94% 95% 94%  Weight:  110.9 kg    Height:       CBC:  Recent Labs  Lab 11/15/2021 0538 11/12/2021 0559 10/26/2021 1525 11/05/21 0608  WBC 8.7  --  10.2 8.6  NEUTROABS 6.9  --   --   --   HGB 11.1*   < > 10.7* 9.8*  HCT 34.9*   < > 34.0* 30.6*  MCV 92.8  --  92.1 93.3  PLT 219  --  209 191   < > = values in this interval not displayed.    Basic Metabolic Panel:  Recent Labs  Lab 11/13/2021 0538 11/17/2021 0559 11/05/2021 1331 11/02/2021 1525 11/05/21 0608  NA 138 138 138  --  139  K 3.2* 3.0* 3.2*  --  3.3*  CL 104 99  --   --  107  CO2 26  --   --   --  22  GLUCOSE 118* 112*  --   --  95  BUN 26* 22  --   --  23  CREATININE 1.17* 1.20*  --  1.15* 1.13*  CALCIUM 8.4*  --   --   --  7.9*  MG  --   --   --   --  1.8     HgbA1c:  Recent Labs  Lab 11/09/2021 1525  HGBA1C 5.4    Urine Drug Screen: No results for input(s): "LABOPIA", "COCAINSCRNUR", "LABBENZ", "AMPHETMU", "THCU", "LABBARB" in the last 168 hours.  Alcohol Level  Recent Labs  Lab 11/03/2021 0538  ETH <10     IMAGING past 24 hours VAS [place] LOWER EXTREMITY VENOUS (DVT)  Result Date: 11/05/2021  Lower Venous DVT Study Patient Name:  YARIELIZ WASSER  Date of Exam:   11/05/2021 Medical Rec #: [id number]        Accession #:    [id number] Date of Birth: Oct 05, 1945         Patient Gender: F Patient Age:   76 years Exam Location:  [facility] Procedure:      VAS [place] LOWER  EXTREMITY VENOUS (DVT) Referring Phys: Alferd Patee [facility] --------------------------------------------------------------------------------  Indications: Swelling.  Limitations: Unable to reposition. Comparison Study: No recent priors. Performing Technologist: Darlin Coco RDMS, RVT  Examination Guidelines: A complete evaluation includes B-mode imaging, spectral Doppler, color Doppler, and power Doppler as needed of all accessible portions of each vessel. Bilateral testing is considered an integral part of a complete examination. Limited examinations for reoccurring indications may be performed as noted. The reflux portion of the exam is performed with the patient in reverse Trendelenburg.  +-----+---------------+---------+-----------+----------+--------------+ RIGHTCompressibilityPhasicitySpontaneityPropertiesThrombus Aging +-----+---------------+---------+-----------+----------+--------------+ CFV  Full           Yes      Yes                                 +-----+---------------+---------+-----------+----------+--------------+   +---------+---------------+---------+-----------+----------+-------------------+  LEFT     CompressibilityPhasicitySpontaneityPropertiesThrombus Aging      +---------+---------------+---------+-----------+----------+-------------------+ CFV      Full           Yes      Yes                                      +---------+---------------+---------+-----------+----------+-------------------+ SFJ      Full                                                             +---------+---------------+---------+-----------+----------+-------------------+ FV Prox  Full                                                             +---------+---------------+---------+-----------+----------+-------------------+ FV Mid   Full                                                              +---------+---------------+---------+-----------+----------+-------------------+ FV DistalFull                                                             +---------+---------------+---------+-----------+----------+-------------------+ PFV      Full                                                             +---------+---------------+---------+-----------+----------+-------------------+ POP                     Yes      Yes                  Unable to perform                                                         compression due to                                                        positioning         +---------+---------------+---------+-----------+----------+-------------------+ PTV      Full                                                             +---------+---------------+---------+-----------+----------+-------------------+  PERO     Full                                                             +---------+---------------+---------+-----------+----------+-------------------+     Summary: RIGHT: - No evidence of common femoral vein obstruction.  LEFT: - There is no evidence of deep vein thrombosis in the lower extremity.  - No cystic structure found in the popliteal fossa.  *See table(s) above for measurements and observations. Electronically signed by Jamelle Haring on 11/05/2021 at 4:21:20 PM.    Final    MR BRAIN WO CONTRAST  Result Date: 11/05/2021 CLINICAL DATA:  76 year old female status post right MCA M2 Large Vessel occlusion on 10/29/2021. Right MCA infarct. Status post NIR reperfusion. Metastatic breast cancer EXAM: MRI HEAD WITHOUT CONTRAST TECHNIQUE: Multiplanar, multiecho pulse sequences of the brain and surrounding structures were obtained without intravenous contrast. COMPARISON:  Presentation CTA CTP head and neck 11/15/2021. Brain MRI 09/09/2021. FINDINGS: Brain: Relatively large right MCA territory infarct extending throughout the  right basal ganglia, posterior right insula, operculum, and right parietal lobe (series 2, image 28, about 10 cm anterior to posterior). Restricted diffusion continues into the lateral half of the right occipital lobe. This resembles the CT Perfusion T-max abnormality. Cytotoxic edema with T2 and FLAIR hyperintensity. Petechial hemorrhage in the basal ganglia [facility] Classification 1B). Mild mass effect on the right lateral ventricle. No midline shift. Basilar cisterns remain normal. Small focus of restricted diffusion also in the central, subcortical white matter, and small area of cortex of the left middle frontal gyrus (series 2, image 35). No other contralateral left hemisphere or posterior fossa restricted diffusion. Chronic microhemorrhage along the right parieto-occipital sulcus. Chronic posterior left MCA/PCA watershed encephalomalacia. Small chronic right cerebellar infarct. Stable gray and white matter signal elsewhere. Cervicomedullary junction and pituitary are within normal limits. Vascular: Major intracranial vascular flow voids are stable. Stable when compared to June. Skull and upper cervical spine: Stable, negative. Sinuses/Orbits: Stable, negative. Other: Mild mastoid effusions have increased. Small volume retained secretions in the pharynx. IMPRESSION: 1. Relatively large Right MCA territory infarct with basal ganglia petechial hemorrhage (Heidelberg Classification 1B). Cytotoxic edema and mild mass effect. No midline shift. 2. Small acute cortical and subcortical infarct also in the left middle frontal gyrus. 3. Stable brain elsewhere. Chronic left MCA/PCA watershed right cerebellar ischemia. Electronically Signed   By: Genevie Ann M.D.   On: 11/05/2021 11:09    PHYSICAL EXAM General, elderly chronically ill Caucasian lady who is intubated not sedated. . Afebrile. Head is nontraumatic. Neck is supple without bruit.    Cardiac exam no murmur or gallop. Lungs are clear to auscultation. Distal  pulses are well felt.  . Afebrile. Head is nontraumatic. Neck is supple without bruit.    Cardiac exam no murmur or gallop. Lungs are clear to auscultation. Distal pulses are well felt. Neurological Exam  : she is intubated.  Sedation off.   Not following commands or moving extremities  ASSESSMENT/PLAN Ms. KELENA GARROW is a 76 y.o. female with history of  76 y.o. female with PMH significant for atrial fibrillation not on anticoagulation due to history of vaginal bleeding on Eliquis, obstructive sleep apnea, prior strokes and TIAs, prediabetes, history of breast cancer and on immunotherapy, nephrolithiasis, hypertension who initially presented to  [facility] with acute onset left-sided weakness, right gaze deviation, neglect.  Per husband over phone, she went to bed at 8:30 PM on 11/03/2021 and he heard her pounding on her bed at 3:30 AM in the morning on 11/15/2021.  He open the door and went to her room and found that she was wedged between the bed and the nightstand.  Husband noted that she was having trouble controlling her left side and her speech was slurred and incoherent.  They called EMS and she was transported to [facility]. Positive for a emergent LVO in the right MCA posterior/inferior M2 branch with decreased vessel density in the posterior MCA distribution noted on the CTA along with a perfusion mismatch in that area. NIHSS 21. To IR for Occlusion of the right M2/MCA posterior parietal branch occlusion with TICI 3 by Dr  Dr. Ethel Rana   Stroke: Large Acute Right MCA ischemic infarct with petechial hemorrhage due to right M2 occlusion s/p thrombectomy of posterior division M2/MCA branch to the posterior parietal region with TICI 2C in the setting of A fib not on AC  Etiology:  likely cardio embolic due to A fib not on AC and also possibly hypercoagulable due to cancer   CT head Developing infarct in the right posterior PCA territory with an aspects of 8. CTA head & neck with CT  perfusion . Positive for ELVO: Right MCA posterior/inferior M2 branch occlusion at the trifurcation. 2. ASPECTS 8 (right insula, M2 segment). CTP detects a portion of that infarct core (3 mL) and estimates ischemic penumbra at 86 mL. 3. No associated hemorrhage or mass effect. 4. Underlying chronic white matter disease, chronic Left MCA/PCA watershed territory infarct. Cerebral angio -Severe tortuosity of the cervical segment of the right ICA without hemodynamically significant stenosis.3. Occlusion of a proximal M2/MCA posterior division branch to the posterior parietal region.   Post IR CT 1. Mechanical thrombectomy treatment of an occlusion of a posterior division M2/MCA branch to the posterior parietal region achieving TICI 2C recanalization. 2. No hemorrhagic complication on postprocedural flat panel CT.  MRI   1. Relatively large Right MCA territory infarct with basal ganglia petechial hemorrhage (Heidelberg Classification 1B). Cytotoxic edema and mild mass effect. No midline shift. 2. Small acute cortical and subcortical infarct also in the left middle frontal gyrus. 3. Stable brain elsewhere. Chronic left MCA/PCA watershed right cerebellar ischemia.  2D Echo EF 60-65% (6/23) [place] lower extremity NO DVT LDL 100 HgbA1c 5.4 VTE prophylaxis - SCD's    Diet   Diet NPO time specified   aspirin 81 mg daily prior to admission, now on No antithrombotic.  Therapy recommendations:  pending  Disposition:  pending   Hypertension Home meds:  cozaar, metoprolol,  Stable SBP goal 120-140 Long-term BP goal normotensive  Hyperlipidemia Home meds:  none,  LDL 100, goal < 70 High intensity statin not indicated due to allergy to statins   P A fib  Not on AC due to vaginal bleeding Home meds on hold    Other Stroke Risk Factors Advanced Age >/= 75  Obesity, Body mass index is 49.38 kg/m., BMI >/= 30 associated with increased stroke risk, recommend weight loss, diet and exercise as  appropriate  Hx stroke/TIA Obstructive sleep apnea, on CPAP at home  Other Active Problems Acute respiratory failure managed by CCM Dysphagia  Hypokalemia 3.3 replaced  AKI Cr 1.13 continue IVF Anemia  Cancer   Hospital day # 2  Beulah Gandy DNP, ACNPC-AG  Beulah Gandy DNP, ACNPC-AG   I have personally obtained history,examined this patient, reviewed notes, independently viewed imaging studies, participated in medical decision making and plan of care.ROS completed by me personally and pertinent positives fully documented  I have made any additions or clarifications directly to the above note. Agree with note above.  Patient prognosis is quite poor given her metastatic breast cancer as well as large stroke and family has agreed to DNR and comfort care measures and await arrival of 1 more family member before compassionate extubation Later today.  Long discussion with patient's son and husband at the bedside and answered questions.  Discussed with Dr. Ernest Mallick with acute care medicine. This patient is critically ill and at significant risk of neurological worsening, death and care requires constant monitoring of vital signs, hemodynamics,respiratory and cardiac monitoring, extensive review of multiple databases, frequent neurological assessment, discussion with family, other specialists and medical decision making of high complexity.I have made any additions or clarifications directly to the above note.This critical care time does not reflect procedure time, or teaching time or supervisory time of PA/NP/Med Resident etc but could involve care discussion time.  I spent 30 minutes of neurocritical care time  in the care of  this patient.     Antony Contras, MD Medical Director [facility] Pager: [phone] 11/06/2021 1:35 PM  To contact Stroke Continuity provider, please refer to [email or web address] After hours, contact General Neurology

## 2021-11-06 NOTE — Progress Notes (Signed)
Palliative:  Consult received and chart reviewed. Appears from IPAL notes goals are established with clear plan in place - chatted with Dr Carlis Abbott and no role for palliative identified at this time. Please reconsult if needs arise.  Juel Burrow, DNP, AGNP-C Palliative Medicine Team Team Phone # [phone]  Pager # [phone]  NO CHARGE

## 2021-11-06 NOTE — IPAL (Signed)
  Interdisciplinary Goals of Care Family Meeting   Date carried out:: 11/06/2021  Location of the meeting: Bedside  Member's involved: Physician and Family Member or next of kin  Durable Power of Attorney or Loss adjuster, chartered: husband, 2 daughters, son    Discussion: We discussed goals of care for [employer] .  Her family is prepared to withdraw care today now that all of her family has arrived. Planning for terminal extubation soon.  Code status: Full DNR  Disposition: In-patient comfort care   Time spent for the meeting: 15 min.  [name] 11/06/2021, 11:51 AM

## 2021-11-06 NOTE — Progress Notes (Signed)
PT Cancellation Note  Patient Details Name: Nicole Cabrera MRN: [id number] DOB: 10-18-1945   Cancelled Treatment:    Reason Eval/Treat Not Completed: Medical issues which prohibited therapy  (Transitioning to comfort care. Weaning from vent. Will sign off. Please re-consult is status changes. Thank you.)  11/06/2021  Ginger Carne., PT Acute Rehabilitation Services [phone]  (pager) [phone]  (office)   Tessie Fass Takeira Yanes 11/06/2021, 10:22 AM

## 2021-11-06 NOTE — Progress Notes (Signed)
On 0000 assessment, this RN observed newly unequal pupils (left 20m, right 470m; still reactive equally to light. Paged neuro on call Dr. BhCurly ShoresConcern for possible herniation/IICP, possible hypertonic saline infusion discussed. No new orders received due to plan for withdrawal of care tomorrow 8/18. Plan to continue monitoring and update upon 0400 assessment.   0400 assessment: no changes to neuro exam, pupils still unequal but reactive, pt still following commands and nodding appropriately on ventilator. Dr. BhCurly Shorespdated. Unable to administer hypertonics due to inability to monitor therapy with lab work - per 8/16 GOEmlentoneeting note, family desires that patient not be stuck for lab draws, or for care to be escalated. No new orders received.

## 2021-11-06 NOTE — Progress Notes (Signed)
OT Cancellation Note  Patient Details Name: Nicole Cabrera MRN: [id number] DOB: 1946/01/17   Cancelled Treatment:    Reason Eval/Treat Not Completed: Medical issues which prohibited therapy (Transitioning to comfort care. Weaning from vent. Will sign off. Please re-consult is status changes. Thank you.)  [place], OTR/L Acute Rehab Office: [phone] 11/06/2021, 10:06 AM

## 2021-11-06 NOTE — Progress Notes (Signed)
NAME:  Nicole Cabrera, MRN:  [id number], DOB:  [birth year], LOS: 2 ADMISSION DATE:  11/19/2021, CONSULTATION DATE:  8/15 REFERRING MD:  Nicole Cabrera, CHIEF COMPLAINT:  hypoxia following extubation after thrombectomy   History of Present Illness:  Nicole Cabrera is a 76 year old with PMH of metastatic breast cancer who recently decided to discontinued therapy, atrial fibrillation not on anticoagulation 2/2 to vaginal bleeding, history of prior CVA, and OSA presented to [facility] 8/15 with acute onset left-sdied weakness, right gauze deviation and neglect. Her last known well was at 8:30PM 8/14 and he found her at 3:30AM wedged between her bed and nightstand with speech changes and trouble controlling left side. He called EMS at that time. CT head and neck showed right MCA occlusion. Outside window for thrombolytics. She was transferred to [facility] for thrombectomy.    Following procedure she was extubated to CPAP in PACU then developed increased respiratory effort and required reintubation. PCCM consulted for acute hypoxic respiratory failure.    Pertinent  Medical History  Atrial fibrillation OSA CVA Predm Hx breast cancer on immunotherapy Nephrolithiasis HTN CKD 3a  Significant Hospital Events: Including procedures, antibiotic start and stop dates in addition to other pertinent events   8/15 presented to [facility] with MCA stroke, transferred to [facility] for thrombectomy. Respiratory distress following extubation post-procedure leading to reintubation 8/16 code status changed to DNR  Interim History / Subjective:  Unequal pupils noted overnight.  Subjective: intubated sedated, opens eyes to voice  Objective   Blood pressure (!) 100/50, pulse 91, temperature 99.1 F (37.3 C), temperature source Axillary, resp. rate (!) 22, height _0  (1.499 m), weight 110.9 kg, last menstrual period 01/22/2011, SpO2 94 %.    Vent Mode: PRVC FiO2 (%):  [40 %] 40 % Set Rate:  [22 bmp] 22 bmp Vt  Set:  [340 mL] 340 mL PEEP:  [5 cmH20] 5 cmH20 Plateau Pressure:  [7 cmH20-13 cmH20] 12 cmH20   Intake/Output Summary (Last 24 hours) at 11/06/2021 7035 Last data filed at 11/06/2021 0600 Gross per 24 hour  Intake 1771.74 ml  Output 525 ml  Net 1246.74 ml    Filed Weights   10/24/2021 0521 11/06/21 0530  Weight: 108 kg 110.9 kg    Examination: General: intubated, sedated HENT: ETT in place Lungs: ventilated lungs sounds bilaterally Cardiovascular: irregularly irregular rhythm, no m/r/g MSK: dimpled skin on left breast Abdomen: non-distended, bowels sounds present, soft Extremities: 2+ non-pitting edema in lower extremities bilaterally, erythema and warmth to left lower extremity Neuro: RASS -2 to -3 GU: urethral catheter in place  MRI showed large right MCA territory infarct with basal ganglia with cytotoxic edema and mild mass effect.   Resolved Hospital Problem list     Assessment & Plan:  Acute hypoxic respiratory failure Small left pleural effusion She remains intubated today. MRI showed large right MCA territory infarct with basal ganglia with cytotoxic edema and mild mass effect. Family moved to DNA with no escalation of care and no lab draws. Her other daughter is coming from [place] and will be in town Thursday night to Friday morning.  -fentanyl, propofol with RASS goal of 0 to-1 -VAP bundle -PAD protocol for sedation -compassionate extubation planned for 8/18   Acute right thrombotic MCA stroke s/p thrombectomy 8/15 Hx of CVA MRI showed large right MCA territory infarct with basal ganglia with cytotoxic edema and mild mass effect.    Cellulitis -ceftriaxone   Atrial fibrillation, not on ac due to  vaginal bleeding She discontinued anticoagulation about a year ago per her son. She has history of prior CVA. Home medications include metoprolol tartrate 25 mg BID. 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 She has not been on therapy for last 2 weeks and had not come to an official decision about discontinuing treatment yet. She had met with palliative. Family has decided for compassionate extubation 8/18  Best Practice (right click and "Reselect all SmartList Selections" daily)   Diet/type: NPO DVT prophylaxis: LMWH GI prophylaxis: PPI Lines: N/A Foley:  Yes, and it is still needed Code Status:  limited Last date of multidisciplinary goals of care discussion [family updated at bedside 8/17]   Nicole Cabrera M. Nicole Cabrera, D.O.  Internal Medicine Resident, PGY-2 [facility]

## 2021-11-13 ENCOUNTER — Inpatient Hospital Stay: Payer: BC Managed Care – PPO | Admitting: Hematology and Oncology

## 2021-11-13 ENCOUNTER — Inpatient Hospital Stay: Payer: BC Managed Care – PPO

## 2021-11-13 ENCOUNTER — Inpatient Hospital Stay: Payer: BC Managed Care – PPO | Admitting: Dietician

## 2021-11-15 ENCOUNTER — Inpatient Hospital Stay: Payer: BC Managed Care – PPO

## 2021-11-19 ENCOUNTER — Ambulatory Visit: Payer: BC Managed Care – PPO | Admitting: Cardiology

## 2021-11-21 NOTE — Death Summary Note (Signed)
   Death Summary   Nicole Cabrera UPJ:[id number] DOB: 19-Sep-1945 DOA: 10-Nov-2021  PCP: Nicole Rio, MD  Admit date: 10-Nov-2021 Date of Death: 11-13-21 Time of Death: 0600  Final Diagnoses:  Principal Problem:   Acute right MCA stroke ([place])  Cause of death: Acute hypoxic respiratory failure  History of present illness:  Nicole Cabrera is a 76 yo with PMH of metastatic breast cancer who recently discontinued chemotherapy, atrial fibrillation, prior CVAs, who presented tp [facility] 11/11/22 with acute onset left-sided weakness, right gaze deviation/ neglect. Last known well was 8/14 at 8:30PM, then he heard her pounding on the bedroom wall at 3:30 AM 11-11-2022. Noted to be having trouble controlling her left side and her speech was slurred and incoherent. CT head/ neck showed right MCA LVO. Transferred to [facility] and taken for emergent thrombectomy. Following procedure, she was extubated then developed respiratory distress refractory to CPAP and was reintubated. Critical care consulted.  Hospital Course:  Acute hypoxic respiratory failure Required reintubation following thrombectomy. With significance of MRI stroke finding s/p thrombectomy likely stokr has affected pharyngeal musculature greatly leading to respiratory failure. Family elected to compassionately extubate 11-13-2022 and patient passed away Nov 14, 2022 at 0600.  Acute r MCA CVA with left hemiparesis, dysarthria and dysphagia MRI showed relatively large right MCA territory infarct extending throughout the right basal ganglia, posterior right insula, operculum, and right parietal lobe .  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 Recent imaging showed not responding to chemotherapy. She had stopped treatment about 2 weeks ago and had not made offical decision to permanently stop chemo therapy.  Permanent Afib She did not take anticoagulation for last year due to vaginal bleeding.  Signed: Daleen Bo. Kinzy Cabrera,  D.O.  Internal Medicine Resident, PGY-2 [facility]  8:21 AM, 11-13-2021

## 2021-11-21 DEATH — deceased

## 2021-11-26 ENCOUNTER — Encounter: Payer: Self-pay | Admitting: Genetic Counselor

## 2021-11-27 ENCOUNTER — Ambulatory Visit: Payer: BC Managed Care – PPO | Admitting: Internal Medicine

## 2021-12-10 ENCOUNTER — Telehealth: Payer: BC Managed Care – PPO | Admitting: Gynecologic Oncology

## 2023-08-20 IMAGING — CT CT HEAD W/O CM
4 series · 16 of 47 positions shown, 18 images · non-contrast
Comparison: 02/07/2019

CLINICAL DATA: Headache since this morning



[Series 2: head w o · axial · 0.40mm/px · z∈[+56,+166]mm · 7 of 30 slices shown, 9 images]
[im 4/30  brain]
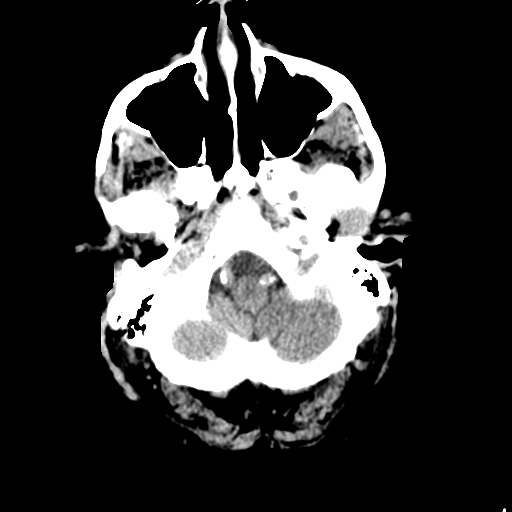
[im 4/30  bone]
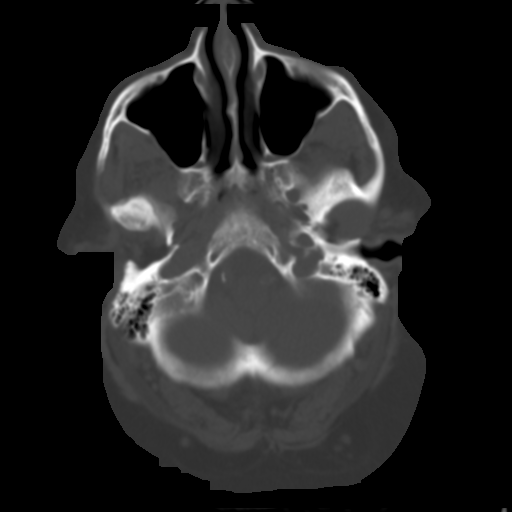
[im 8/30  brain]
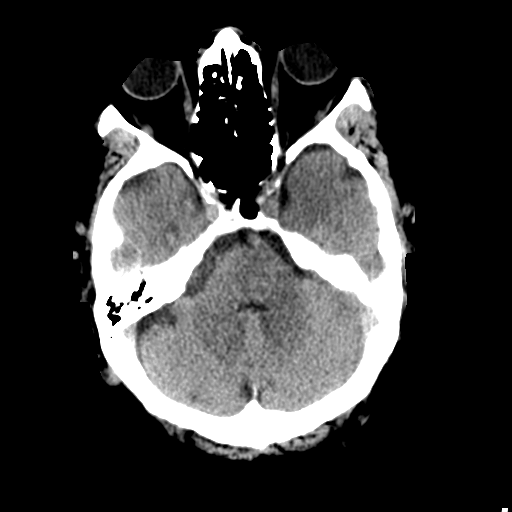
[im 11/30  brain]
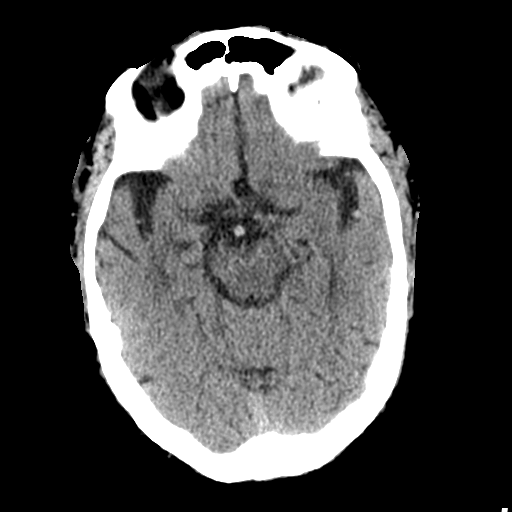
[im 15/30  brain]
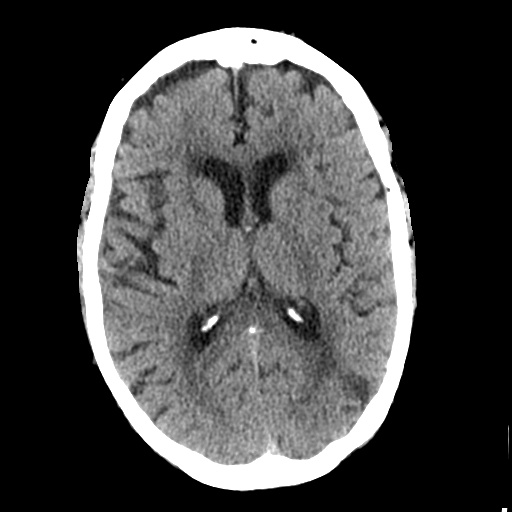
[im 19/30  brain]
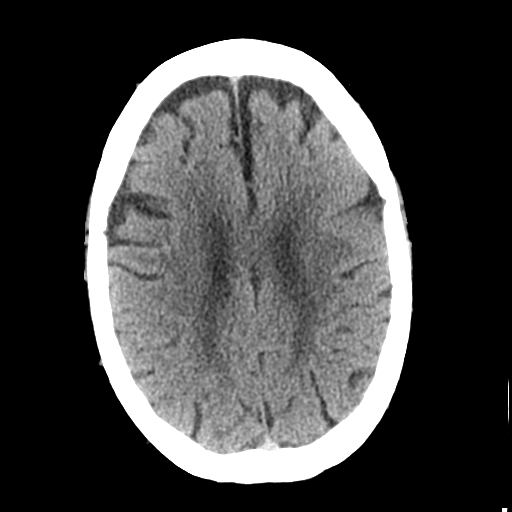
[im 19/30  bone]
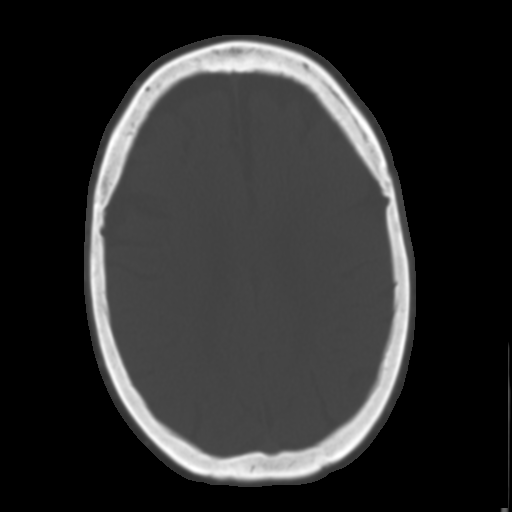
[im 22/30  brain]
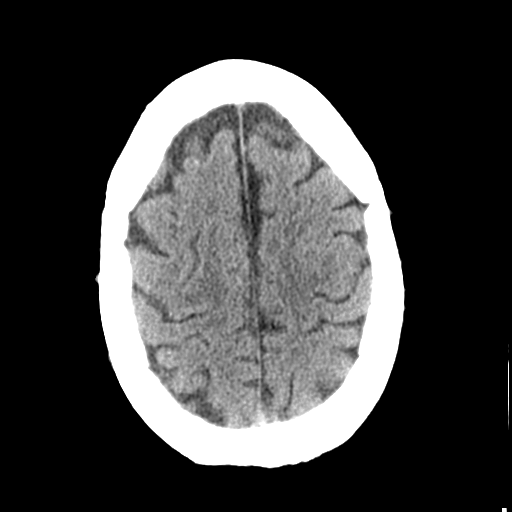
[im 26/30  brain]
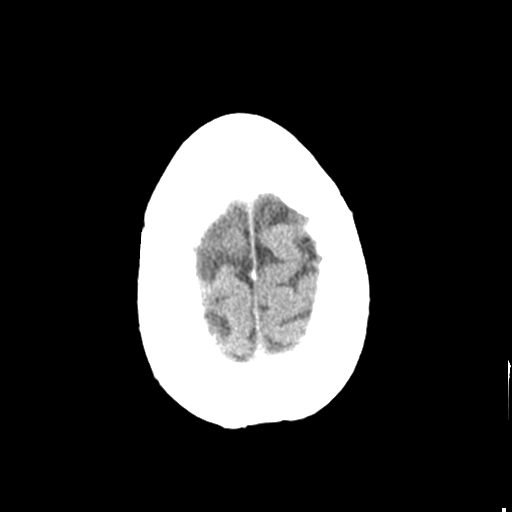

[Series 3: head bone · axial · 0.40mm/px · z∈[+55,+85]mm · 3 of 75 slices shown]
[im 8/75  bone]
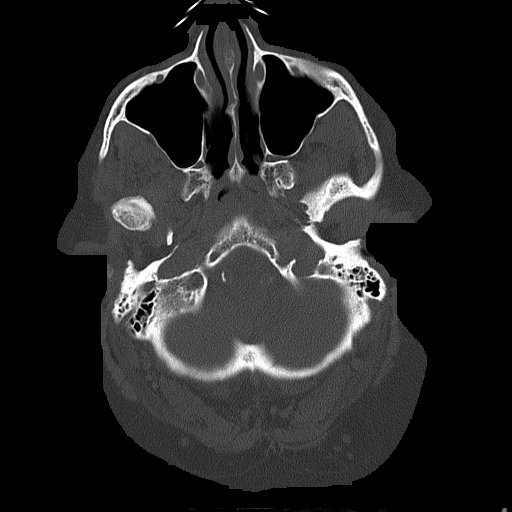
[im 15/75  bone]
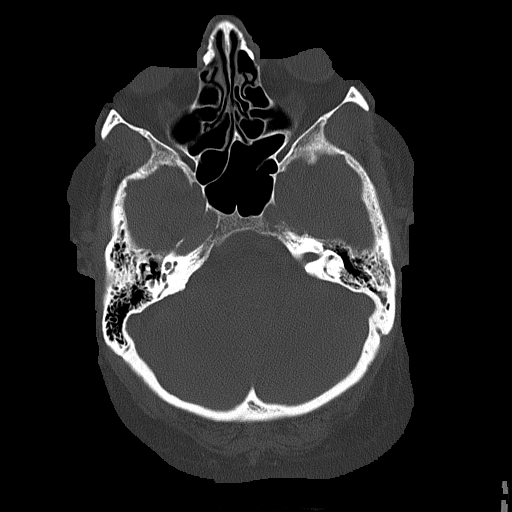
[im 23/75  bone]
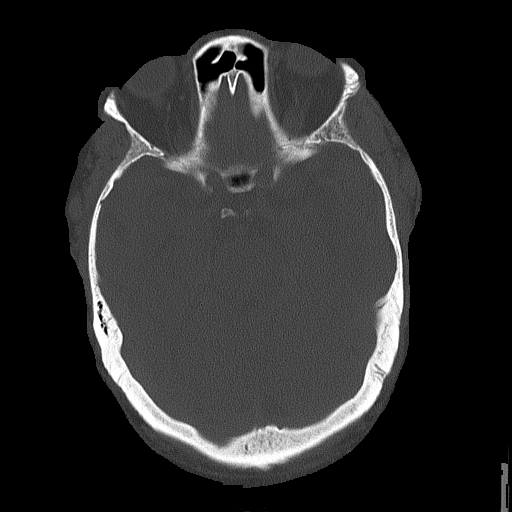

[Series 4: coronal soft · coronal · 0.31mm/px · 3 of 68 slices shown]
[im 23/68  brain]
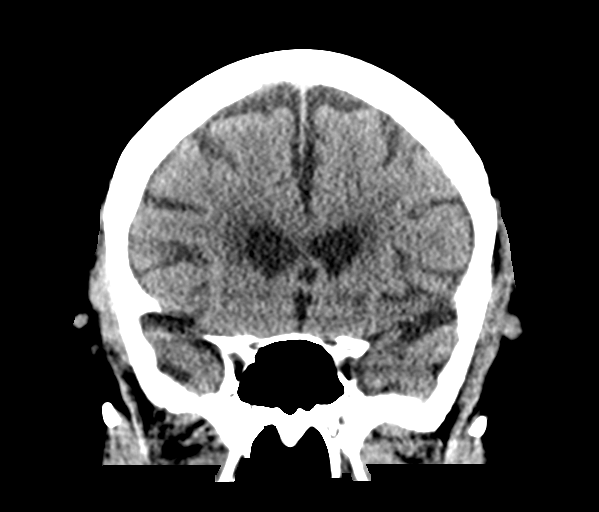
[im 30/68  brain]
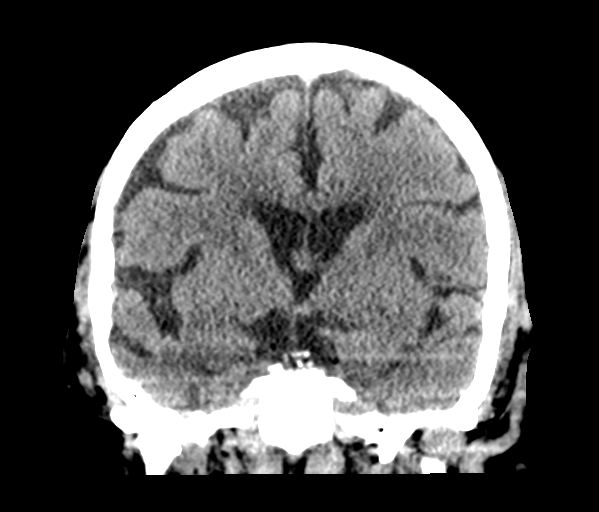
[im 38/68  brain]
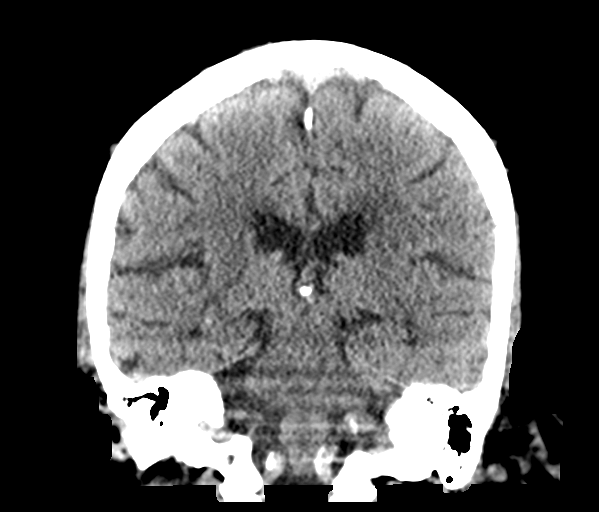

[Series 5: sagittal soft · sagittal · 0.31mm/px · 3 of 50 slices shown]
[im 17/50  brain]
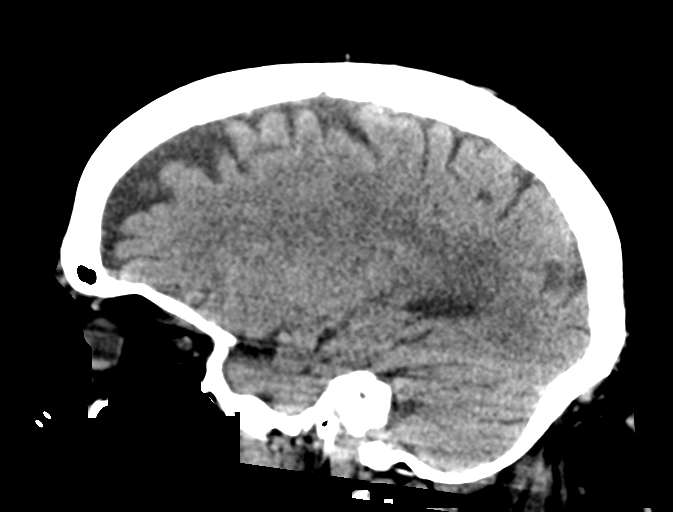
[im 25/50  brain]
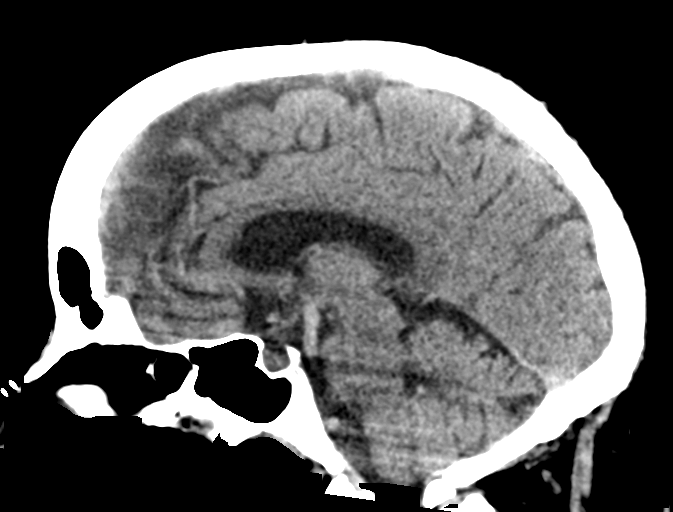
[im 33/50  brain]
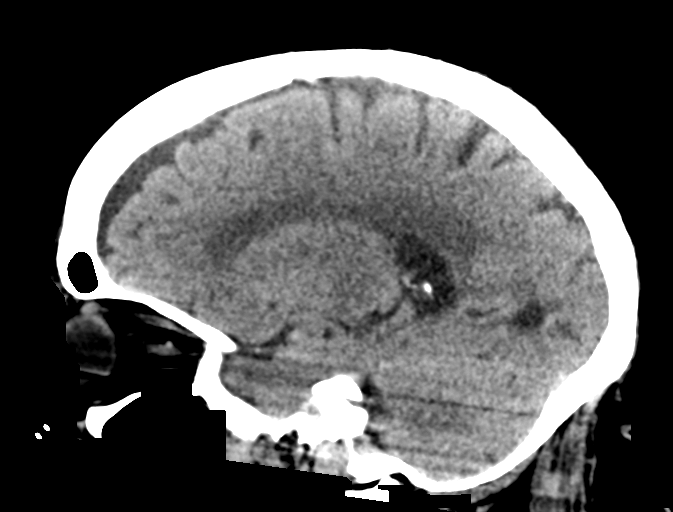

[16 of 47 positions shown; findings below may reference images not displayed]

FINDINGS: Brain: Stable hypodensities throughout the periventricular white
matter consistent with chronic small vessel ischemic change. Chronic
small cortical infarct left parietal region, stable. No evidence of
acute infarct or hemorrhage. Lateral ventricles and remaining
midline structures are unremarkable. No acute extra-axial fluid
collections. No mass effect.

Vascular: No hyperdense vessel or unexpected calcification.

Skull: Normal. Negative for fracture or focal lesion.

Sinuses/Orbits: No acute finding.

Other: None.
IMPRESSION: 1. Chronic ischemic changes as above. No acute intracranial process.
